# Patient Record
Sex: Male | Born: 1950 | Race: White | Hispanic: No | Smoking: Never smoker
Health system: Southern US, Community
[De-identification: ages and names within clinical notes are randomized; demographics above are authoritative.]

## PROBLEM LIST (undated history)

## (undated) DIAGNOSIS — E119 Type 2 diabetes mellitus without complications: Secondary | ICD-10-CM

## (undated) DIAGNOSIS — E039 Hypothyroidism, unspecified: Secondary | ICD-10-CM

## (undated) DIAGNOSIS — G510 Bell's palsy: Secondary | ICD-10-CM

## (undated) DIAGNOSIS — Q2381 Bicuspid aortic valve: Secondary | ICD-10-CM

## (undated) DIAGNOSIS — I712 Thoracic aortic aneurysm, without rupture, unspecified: Secondary | ICD-10-CM

## (undated) DIAGNOSIS — G2581 Restless legs syndrome: Secondary | ICD-10-CM

## (undated) DIAGNOSIS — F419 Anxiety disorder, unspecified: Secondary | ICD-10-CM

## (undated) DIAGNOSIS — I480 Paroxysmal atrial fibrillation: Secondary | ICD-10-CM

## (undated) DIAGNOSIS — H5440 Blindness, one eye, unspecified eye: Secondary | ICD-10-CM

## (undated) DIAGNOSIS — Z7901 Long term (current) use of anticoagulants: Secondary | ICD-10-CM

## (undated) DIAGNOSIS — E785 Hyperlipidemia, unspecified: Secondary | ICD-10-CM

## (undated) DIAGNOSIS — Z9289 Personal history of other medical treatment: Secondary | ICD-10-CM

## (undated) DIAGNOSIS — N189 Chronic kidney disease, unspecified: Secondary | ICD-10-CM

## (undated) DIAGNOSIS — I10 Essential (primary) hypertension: Secondary | ICD-10-CM

## (undated) DIAGNOSIS — Q231 Congenital insufficiency of aortic valve: Secondary | ICD-10-CM

## (undated) DIAGNOSIS — M1990 Unspecified osteoarthritis, unspecified site: Secondary | ICD-10-CM

## (undated) DIAGNOSIS — E669 Obesity, unspecified: Secondary | ICD-10-CM

## (undated) DIAGNOSIS — M222X9 Patellofemoral disorders, unspecified knee: Secondary | ICD-10-CM

## (undated) DIAGNOSIS — F329 Major depressive disorder, single episode, unspecified: Secondary | ICD-10-CM

## (undated) DIAGNOSIS — Z950 Presence of cardiac pacemaker: Secondary | ICD-10-CM

## (undated) DIAGNOSIS — F32A Depression, unspecified: Secondary | ICD-10-CM

## (undated) DIAGNOSIS — I2510 Atherosclerotic heart disease of native coronary artery without angina pectoris: Secondary | ICD-10-CM

## (undated) DIAGNOSIS — I442 Atrioventricular block, complete: Secondary | ICD-10-CM

## (undated) HISTORY — PX: AORTIC ROOT REPLACEMENT: SHX1178

## (undated) HISTORY — PX: TONSILLECTOMY: SUR1361

## (undated) HISTORY — DX: Patellofemoral disorders, unspecified knee: M22.2X9

## (undated) HISTORY — DX: Atherosclerotic heart disease of native coronary artery without angina pectoris: I25.10

## (undated) HISTORY — PX: CARDIAC VALVE REPLACEMENT: SHX585

## (undated) HISTORY — PX: CARDIAC CATHETERIZATION: SHX172

## (undated) HISTORY — PX: PACEMAKER INSERTION: SHX728

## (undated) HISTORY — DX: Personal history of other medical treatment: Z92.89

## (undated) HISTORY — DX: Thoracic aortic aneurysm, without rupture, unspecified: I71.20

## (undated) HISTORY — PX: AORTIC VALVE REPLACEMENT: SHX41

## (undated) HISTORY — DX: Thoracic aortic aneurysm, without rupture: I71.2

## (undated) HISTORY — PX: OTHER SURGICAL HISTORY: SHX169

---

## 1998-10-17 ENCOUNTER — Encounter: Payer: Self-pay | Admitting: Emergency Medicine

## 1998-10-17 ENCOUNTER — Emergency Department (HOSPITAL_COMMUNITY): Admission: EM | Admit: 1998-10-17 | Discharge: 1998-10-17 | Payer: Self-pay | Admitting: Emergency Medicine

## 1998-10-26 ENCOUNTER — Encounter: Admission: RE | Admit: 1998-10-26 | Discharge: 1999-01-24 | Payer: Self-pay | Admitting: *Deleted

## 1998-12-12 ENCOUNTER — Encounter: Payer: Self-pay | Admitting: Emergency Medicine

## 1998-12-12 ENCOUNTER — Emergency Department (HOSPITAL_COMMUNITY): Admission: EM | Admit: 1998-12-12 | Discharge: 1998-12-12 | Payer: Self-pay | Admitting: Emergency Medicine

## 1999-02-26 ENCOUNTER — Emergency Department (HOSPITAL_COMMUNITY): Admission: EM | Admit: 1999-02-26 | Discharge: 1999-02-26 | Payer: Self-pay | Admitting: Emergency Medicine

## 1999-02-26 ENCOUNTER — Encounter: Payer: Self-pay | Admitting: Emergency Medicine

## 1999-11-25 ENCOUNTER — Encounter: Payer: Self-pay | Admitting: Emergency Medicine

## 1999-11-25 ENCOUNTER — Inpatient Hospital Stay (HOSPITAL_COMMUNITY): Admission: EM | Admit: 1999-11-25 | Discharge: 1999-11-26 | Payer: Self-pay | Admitting: Internal Medicine

## 2000-07-24 ENCOUNTER — Ambulatory Visit (HOSPITAL_BASED_OUTPATIENT_CLINIC_OR_DEPARTMENT_OTHER): Admission: RE | Admit: 2000-07-24 | Discharge: 2000-07-24 | Payer: Self-pay | Admitting: Orthopaedic Surgery

## 2001-01-19 ENCOUNTER — Emergency Department (HOSPITAL_COMMUNITY): Admission: EM | Admit: 2001-01-19 | Discharge: 2001-01-19 | Payer: Self-pay | Admitting: Emergency Medicine

## 2001-01-19 ENCOUNTER — Encounter: Payer: Self-pay | Admitting: Emergency Medicine

## 2001-03-18 ENCOUNTER — Emergency Department (HOSPITAL_COMMUNITY): Admission: EM | Admit: 2001-03-18 | Discharge: 2001-03-18 | Payer: Self-pay | Admitting: Emergency Medicine

## 2001-03-18 ENCOUNTER — Encounter: Payer: Self-pay | Admitting: Emergency Medicine

## 2001-08-30 ENCOUNTER — Encounter: Payer: Self-pay | Admitting: Emergency Medicine

## 2001-08-30 ENCOUNTER — Emergency Department (HOSPITAL_COMMUNITY): Admission: EM | Admit: 2001-08-30 | Discharge: 2001-08-30 | Payer: Self-pay | Admitting: Emergency Medicine

## 2001-12-08 ENCOUNTER — Emergency Department (HOSPITAL_COMMUNITY): Admission: EM | Admit: 2001-12-08 | Discharge: 2001-12-08 | Payer: Self-pay | Admitting: Emergency Medicine

## 2001-12-08 ENCOUNTER — Encounter: Payer: Self-pay | Admitting: *Deleted

## 2002-01-07 ENCOUNTER — Encounter: Admission: RE | Admit: 2002-01-07 | Discharge: 2002-01-07 | Payer: Self-pay | Admitting: Family Medicine

## 2002-01-07 ENCOUNTER — Encounter: Payer: Self-pay | Admitting: Family Medicine

## 2002-04-18 ENCOUNTER — Emergency Department (HOSPITAL_COMMUNITY): Admission: EM | Admit: 2002-04-18 | Discharge: 2002-04-18 | Payer: Self-pay | Admitting: *Deleted

## 2002-05-13 ENCOUNTER — Encounter: Payer: Self-pay | Admitting: Emergency Medicine

## 2002-05-13 ENCOUNTER — Emergency Department (HOSPITAL_COMMUNITY): Admission: EM | Admit: 2002-05-13 | Discharge: 2002-05-13 | Payer: Self-pay | Admitting: Emergency Medicine

## 2003-04-16 ENCOUNTER — Emergency Department (HOSPITAL_COMMUNITY): Admission: EM | Admit: 2003-04-16 | Discharge: 2003-04-16 | Payer: Self-pay | Admitting: Emergency Medicine

## 2003-04-16 ENCOUNTER — Encounter: Payer: Self-pay | Admitting: Emergency Medicine

## 2004-12-07 ENCOUNTER — Ambulatory Visit: Payer: Self-pay | Admitting: Cardiology

## 2004-12-07 ENCOUNTER — Encounter: Payer: Self-pay | Admitting: Cardiology

## 2004-12-07 ENCOUNTER — Ambulatory Visit (HOSPITAL_COMMUNITY): Admission: RE | Admit: 2004-12-07 | Discharge: 2004-12-07 | Payer: Self-pay | Admitting: Internal Medicine

## 2004-12-13 ENCOUNTER — Ambulatory Visit: Payer: Self-pay | Admitting: Cardiology

## 2004-12-19 ENCOUNTER — Ambulatory Visit: Payer: Self-pay | Admitting: *Deleted

## 2004-12-23 ENCOUNTER — Ambulatory Visit: Payer: Self-pay

## 2005-01-04 ENCOUNTER — Ambulatory Visit: Payer: Self-pay | Admitting: Cardiology

## 2005-05-24 ENCOUNTER — Ambulatory Visit: Payer: Self-pay | Admitting: Cardiology

## 2005-05-24 ENCOUNTER — Encounter: Payer: Self-pay | Admitting: Cardiology

## 2005-05-24 ENCOUNTER — Inpatient Hospital Stay (HOSPITAL_COMMUNITY): Admission: AD | Admit: 2005-05-24 | Discharge: 2005-05-26 | Payer: Self-pay | Admitting: Cardiology

## 2005-05-24 ENCOUNTER — Ambulatory Visit: Payer: Self-pay | Admitting: Internal Medicine

## 2005-06-05 ENCOUNTER — Ambulatory Visit: Payer: Self-pay | Admitting: Cardiology

## 2005-06-05 ENCOUNTER — Encounter: Payer: Self-pay | Admitting: Nurse Practitioner

## 2006-09-12 ENCOUNTER — Encounter: Payer: Self-pay | Admitting: Internal Medicine

## 2006-09-12 ENCOUNTER — Ambulatory Visit: Payer: Self-pay

## 2006-09-12 ENCOUNTER — Ambulatory Visit: Payer: Self-pay | Admitting: Cardiology

## 2006-09-13 ENCOUNTER — Encounter (INDEPENDENT_AMBULATORY_CARE_PROVIDER_SITE_OTHER): Payer: Self-pay | Admitting: Cardiology

## 2006-09-13 LAB — CONVERTED CEMR LAB
BUN: 11 mg/dL (ref 6–23)
Creatinine, Ser: 0.91 mg/dL (ref 0.40–1.50)

## 2006-09-14 ENCOUNTER — Ambulatory Visit (HOSPITAL_COMMUNITY): Admission: RE | Admit: 2006-09-14 | Discharge: 2006-09-14 | Payer: Self-pay | Admitting: Cardiology

## 2006-10-03 ENCOUNTER — Ambulatory Visit: Payer: Self-pay | Admitting: Internal Medicine

## 2007-01-17 ENCOUNTER — Inpatient Hospital Stay (HOSPITAL_COMMUNITY): Admission: EM | Admit: 2007-01-17 | Discharge: 2007-01-18 | Payer: Self-pay | Admitting: Emergency Medicine

## 2007-01-17 ENCOUNTER — Ambulatory Visit: Payer: Self-pay | Admitting: *Deleted

## 2007-01-17 ENCOUNTER — Ambulatory Visit: Payer: Self-pay | Admitting: Cardiovascular Disease

## 2007-01-17 ENCOUNTER — Encounter: Payer: Self-pay | Admitting: Cardiovascular Disease

## 2007-01-17 ENCOUNTER — Ambulatory Visit: Payer: Self-pay | Admitting: Cardiology

## 2007-01-23 ENCOUNTER — Encounter: Payer: Self-pay | Admitting: Cardiovascular Disease

## 2007-01-23 ENCOUNTER — Ambulatory Visit: Payer: Self-pay | Admitting: Cardiology

## 2007-01-23 LAB — CONVERTED CEMR LAB
INR: 1.1 (ref 0.9–2.0)
Prothrombin Time: 12.9 s (ref 10.0–14.0)
aPTT: 25.5 s — ABNORMAL LOW (ref 26.5–36.5)

## 2007-01-31 ENCOUNTER — Ambulatory Visit: Payer: Self-pay | Admitting: Cardiology

## 2007-01-31 LAB — CONVERTED CEMR LAB
BUN: 11 mg/dL (ref 6–23)
Basophils Absolute: 0.1 10*3/uL (ref 0.0–0.1)
Calcium: 9.6 mg/dL (ref 8.4–10.5)
Eosinophils Absolute: 0.3 10*3/uL (ref 0.0–0.6)
GFR calc Af Amer: 100 mL/min
GFR calc non Af Amer: 82 mL/min
Glucose, Bld: 167 mg/dL — ABNORMAL HIGH (ref 70–99)
Lymphocytes Relative: 28.8 % (ref 12.0–46.0)
MCHC: 34.4 g/dL (ref 30.0–36.0)
MCV: 90.8 fL (ref 78.0–100.0)
Monocytes Relative: 9.9 % (ref 3.0–11.0)
Neutro Abs: 5.5 10*3/uL (ref 1.4–7.7)
Platelets: 216 10*3/uL (ref 150–400)
RBC: 4.45 M/uL (ref 4.22–5.81)
aPTT: 26.4 s — ABNORMAL LOW (ref 26.5–36.5)

## 2007-02-05 ENCOUNTER — Ambulatory Visit: Payer: Self-pay | Admitting: Cardiology

## 2007-02-05 ENCOUNTER — Inpatient Hospital Stay (HOSPITAL_BASED_OUTPATIENT_CLINIC_OR_DEPARTMENT_OTHER): Admission: RE | Admit: 2007-02-05 | Discharge: 2007-02-05 | Payer: Self-pay | Admitting: Cardiology

## 2007-03-06 ENCOUNTER — Ambulatory Visit: Payer: Self-pay | Admitting: Cardiology

## 2008-06-28 ENCOUNTER — Emergency Department (HOSPITAL_COMMUNITY): Admission: EM | Admit: 2008-06-28 | Discharge: 2008-06-28 | Payer: Self-pay | Admitting: Emergency Medicine

## 2008-07-01 ENCOUNTER — Ambulatory Visit (HOSPITAL_BASED_OUTPATIENT_CLINIC_OR_DEPARTMENT_OTHER): Admission: RE | Admit: 2008-07-01 | Discharge: 2008-07-01 | Payer: Self-pay | Admitting: Urology

## 2008-07-01 ENCOUNTER — Encounter: Payer: Self-pay | Admitting: Cardiovascular Disease

## 2008-12-17 ENCOUNTER — Ambulatory Visit: Payer: Self-pay | Admitting: Cardiology

## 2008-12-17 ENCOUNTER — Inpatient Hospital Stay (HOSPITAL_COMMUNITY): Admission: EM | Admit: 2008-12-17 | Discharge: 2008-12-20 | Payer: Self-pay | Admitting: Emergency Medicine

## 2008-12-18 ENCOUNTER — Encounter (INDEPENDENT_AMBULATORY_CARE_PROVIDER_SITE_OTHER): Payer: Self-pay | Admitting: Cardiology

## 2008-12-21 ENCOUNTER — Telehealth: Payer: Self-pay | Admitting: Cardiovascular Disease

## 2008-12-23 DIAGNOSIS — I359 Nonrheumatic aortic valve disorder, unspecified: Secondary | ICD-10-CM | POA: Insufficient documentation

## 2008-12-23 DIAGNOSIS — I712 Thoracic aortic aneurysm, without rupture, unspecified: Secondary | ICD-10-CM | POA: Insufficient documentation

## 2008-12-23 DIAGNOSIS — H5440 Blindness, one eye, unspecified eye: Secondary | ICD-10-CM | POA: Insufficient documentation

## 2008-12-23 DIAGNOSIS — N1832 Chronic kidney disease, stage 3b: Secondary | ICD-10-CM | POA: Insufficient documentation

## 2008-12-23 DIAGNOSIS — N189 Chronic kidney disease, unspecified: Secondary | ICD-10-CM | POA: Insufficient documentation

## 2008-12-23 DIAGNOSIS — Z794 Long term (current) use of insulin: Secondary | ICD-10-CM

## 2008-12-23 DIAGNOSIS — F4310 Post-traumatic stress disorder, unspecified: Secondary | ICD-10-CM | POA: Insufficient documentation

## 2008-12-23 DIAGNOSIS — R55 Syncope and collapse: Secondary | ICD-10-CM | POA: Insufficient documentation

## 2008-12-23 DIAGNOSIS — E1140 Type 2 diabetes mellitus with diabetic neuropathy, unspecified: Secondary | ICD-10-CM | POA: Insufficient documentation

## 2008-12-23 DIAGNOSIS — R519 Headache, unspecified: Secondary | ICD-10-CM | POA: Insufficient documentation

## 2008-12-23 DIAGNOSIS — R51 Headache: Secondary | ICD-10-CM | POA: Insufficient documentation

## 2008-12-23 DIAGNOSIS — E663 Overweight: Secondary | ICD-10-CM | POA: Insufficient documentation

## 2008-12-23 DIAGNOSIS — E039 Hypothyroidism, unspecified: Secondary | ICD-10-CM | POA: Insufficient documentation

## 2008-12-28 ENCOUNTER — Ambulatory Visit: Payer: Self-pay | Admitting: Cardiovascular Disease

## 2008-12-28 DIAGNOSIS — I2510 Atherosclerotic heart disease of native coronary artery without angina pectoris: Secondary | ICD-10-CM | POA: Insufficient documentation

## 2008-12-28 LAB — CONVERTED CEMR LAB
BUN: 18 mg/dL (ref 6–23)
GFR calc non Af Amer: 60.38 mL/min (ref >60)
Potassium: 4.5 meq/L (ref 3.5–5.1)

## 2008-12-29 ENCOUNTER — Ambulatory Visit: Payer: Self-pay | Admitting: Cardiology

## 2008-12-29 ENCOUNTER — Encounter: Payer: Self-pay | Admitting: Cardiovascular Disease

## 2009-01-28 ENCOUNTER — Ambulatory Visit: Payer: Self-pay | Admitting: Cardiovascular Disease

## 2009-05-15 ENCOUNTER — Ambulatory Visit: Payer: Self-pay | Admitting: Internal Medicine

## 2009-05-15 ENCOUNTER — Inpatient Hospital Stay (HOSPITAL_COMMUNITY): Admission: EM | Admit: 2009-05-15 | Discharge: 2009-05-18 | Payer: Self-pay | Admitting: Emergency Medicine

## 2009-05-16 ENCOUNTER — Encounter: Payer: Self-pay | Admitting: Internal Medicine

## 2009-05-17 ENCOUNTER — Encounter: Payer: Self-pay | Admitting: Internal Medicine

## 2009-05-17 ENCOUNTER — Encounter: Payer: Self-pay | Admitting: Surgery

## 2009-05-18 ENCOUNTER — Ambulatory Visit: Payer: Self-pay | Admitting: Surgery

## 2009-05-18 ENCOUNTER — Encounter: Payer: Self-pay | Admitting: Internal Medicine

## 2009-06-01 ENCOUNTER — Ambulatory Visit: Payer: Self-pay | Admitting: Surgery

## 2009-06-03 ENCOUNTER — Ambulatory Visit: Admission: RE | Admit: 2009-06-03 | Discharge: 2009-06-03 | Payer: Self-pay | Admitting: Surgery

## 2009-06-07 ENCOUNTER — Inpatient Hospital Stay (HOSPITAL_COMMUNITY): Admission: RE | Admit: 2009-06-07 | Discharge: 2009-06-15 | Payer: Self-pay | Admitting: Surgery

## 2009-06-07 ENCOUNTER — Ambulatory Visit: Payer: Self-pay | Admitting: Cardiology

## 2009-06-07 ENCOUNTER — Encounter: Payer: Self-pay | Admitting: Surgery

## 2009-06-09 ENCOUNTER — Encounter: Payer: Self-pay | Admitting: Surgery

## 2009-06-10 ENCOUNTER — Encounter: Payer: Self-pay | Admitting: Internal Medicine

## 2009-06-11 ENCOUNTER — Encounter (INDEPENDENT_AMBULATORY_CARE_PROVIDER_SITE_OTHER): Payer: Self-pay | Admitting: *Deleted

## 2009-06-17 ENCOUNTER — Ambulatory Visit: Payer: Self-pay | Admitting: Cardiology

## 2009-06-17 LAB — CONVERTED CEMR LAB: POC INR: 3.3

## 2009-06-21 ENCOUNTER — Encounter: Payer: Self-pay | Admitting: Cardiovascular Disease

## 2009-06-22 ENCOUNTER — Ambulatory Visit: Payer: Self-pay | Admitting: Cardiovascular Disease

## 2009-06-24 ENCOUNTER — Ambulatory Visit: Payer: Self-pay | Admitting: Cardiovascular Disease

## 2009-06-29 ENCOUNTER — Encounter: Admission: RE | Admit: 2009-06-29 | Discharge: 2009-06-29 | Payer: Self-pay | Admitting: Surgery

## 2009-07-02 ENCOUNTER — Ambulatory Visit: Payer: Self-pay | Admitting: Internal Medicine

## 2009-07-02 LAB — CONVERTED CEMR LAB: POC INR: 3

## 2009-07-05 ENCOUNTER — Ambulatory Visit: Payer: Self-pay | Admitting: Surgery

## 2009-07-06 ENCOUNTER — Encounter: Payer: Self-pay | Admitting: Cardiovascular Disease

## 2009-12-02 ENCOUNTER — Emergency Department (HOSPITAL_COMMUNITY): Admission: EM | Admit: 2009-12-02 | Discharge: 2009-12-03 | Payer: Self-pay | Admitting: Emergency Medicine

## 2009-12-03 ENCOUNTER — Ambulatory Visit: Payer: Self-pay | Admitting: Cardiology

## 2009-12-03 ENCOUNTER — Inpatient Hospital Stay (HOSPITAL_COMMUNITY): Admission: EM | Admit: 2009-12-03 | Discharge: 2009-12-05 | Payer: Self-pay | Admitting: Emergency Medicine

## 2009-12-03 ENCOUNTER — Encounter (INDEPENDENT_AMBULATORY_CARE_PROVIDER_SITE_OTHER): Payer: Self-pay | Admitting: Internal Medicine

## 2010-01-20 ENCOUNTER — Emergency Department (HOSPITAL_COMMUNITY): Admission: EM | Admit: 2010-01-20 | Discharge: 2010-01-20 | Payer: Self-pay | Admitting: Emergency Medicine

## 2010-01-30 ENCOUNTER — Emergency Department (HOSPITAL_COMMUNITY): Admission: EM | Admit: 2010-01-30 | Discharge: 2010-01-30 | Payer: Self-pay | Admitting: Emergency Medicine

## 2010-03-02 ENCOUNTER — Inpatient Hospital Stay (HOSPITAL_COMMUNITY): Admission: EM | Admit: 2010-03-02 | Discharge: 2010-03-03 | Payer: Self-pay | Admitting: Emergency Medicine

## 2010-03-28 ENCOUNTER — Inpatient Hospital Stay (HOSPITAL_COMMUNITY): Admission: EM | Admit: 2010-03-28 | Discharge: 2010-03-30 | Payer: Self-pay | Admitting: Emergency Medicine

## 2010-04-04 ENCOUNTER — Encounter (INDEPENDENT_AMBULATORY_CARE_PROVIDER_SITE_OTHER): Payer: Self-pay | Admitting: *Deleted

## 2010-05-20 ENCOUNTER — Emergency Department (HOSPITAL_COMMUNITY): Admission: EM | Admit: 2010-05-20 | Discharge: 2010-05-20 | Payer: Self-pay | Admitting: Emergency Medicine

## 2010-05-23 ENCOUNTER — Emergency Department (HOSPITAL_COMMUNITY): Admission: EM | Admit: 2010-05-23 | Discharge: 2010-05-23 | Payer: Self-pay | Admitting: Emergency Medicine

## 2010-06-03 ENCOUNTER — Encounter (INDEPENDENT_AMBULATORY_CARE_PROVIDER_SITE_OTHER): Payer: Self-pay | Admitting: *Deleted

## 2010-06-16 ENCOUNTER — Encounter: Payer: Self-pay | Admitting: Internal Medicine

## 2010-07-02 ENCOUNTER — Emergency Department (HOSPITAL_COMMUNITY): Admission: EM | Admit: 2010-07-02 | Discharge: 2010-07-02 | Payer: Self-pay | Admitting: Emergency Medicine

## 2010-08-23 IMAGING — CR DG CHEST 1V PORT
1 series · 1 of 1 positions shown · non-contrast
Comparison: 06/09/2009 study.

CLINICAL DATA: History given of mitral valvular replacement.
History given of dyspnea.

PORTABLE CHEST - 1 VIEW

[view not recorded]
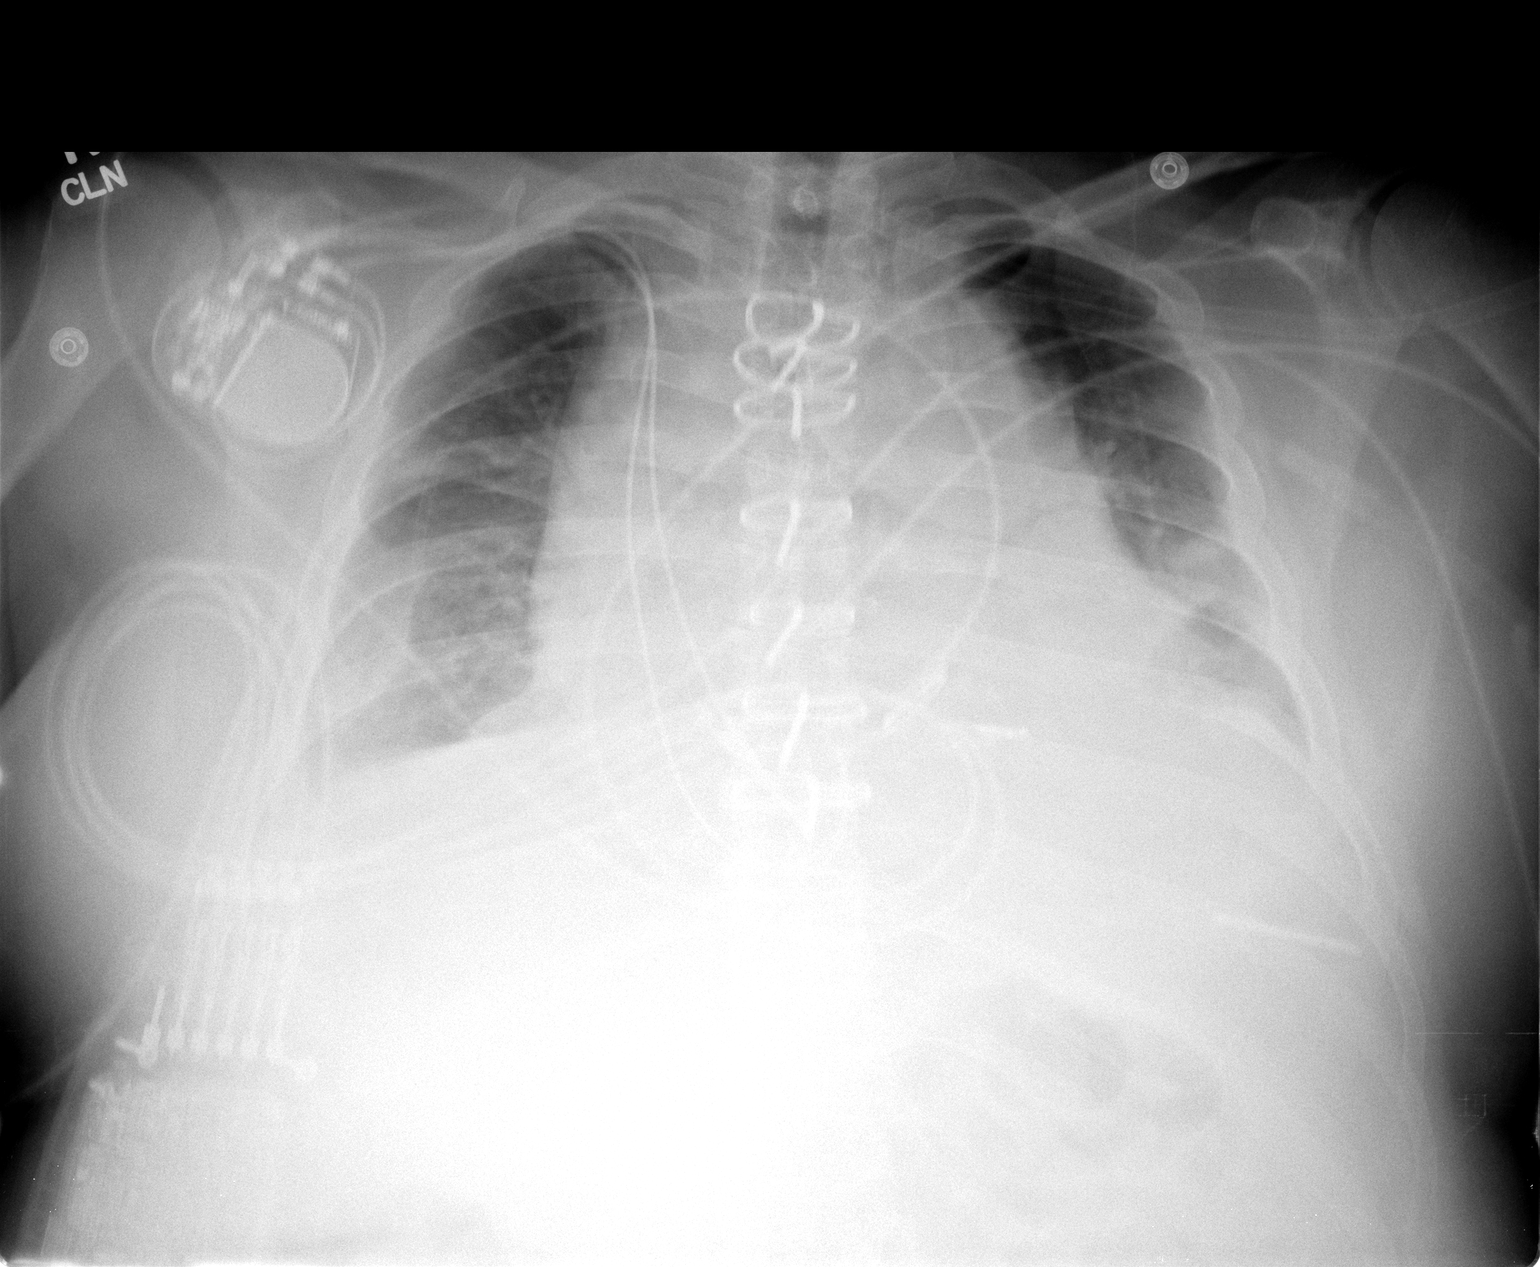

[1 of 1 positions shown; findings below may reference images not displayed]

FINDINGS: Detail is degraded by motion.
Median sternotomy has been performed.  There is stable moderate
enlargement of the cardiac silhouette.  There is hypoinflation of
the lungs with areas of subsegmental atelectasis in the right mid
and lower lung and in the lower left lung.  Left basilar
retrocardiac opacity is seen consistent with left basilar
atelectasis, infiltrate, and pleural effusion. Slight blunting
right costophrenic angle may reflect a small amount of right
pleural effusion.  Dual lead transvenous pacemaker is in place with
controller device on the right.
IMPRESSION: There is stable enlargement of the cardiac silhouette.  There is
mild vascular congestion pattern with hypoinflation of the lungs.
Areas of atelectasis are seen in the mid and lower lung zones.  The
atelectasis has increased slightly in the left lower mid lung zone.
Left basilar opacity consistent with atelectasis, infiltrate, and
left pleural effusion is persistent.

## 2010-08-23 IMAGING — CR DG CHEST 1V PORT
1 series · 1 of 1 positions shown · non-contrast
Comparison: Chest radiograph 06/08/2009

CLINICAL DATA: Ascending aortic repair

PORTABLE CHEST - 1 VIEW

[AP]
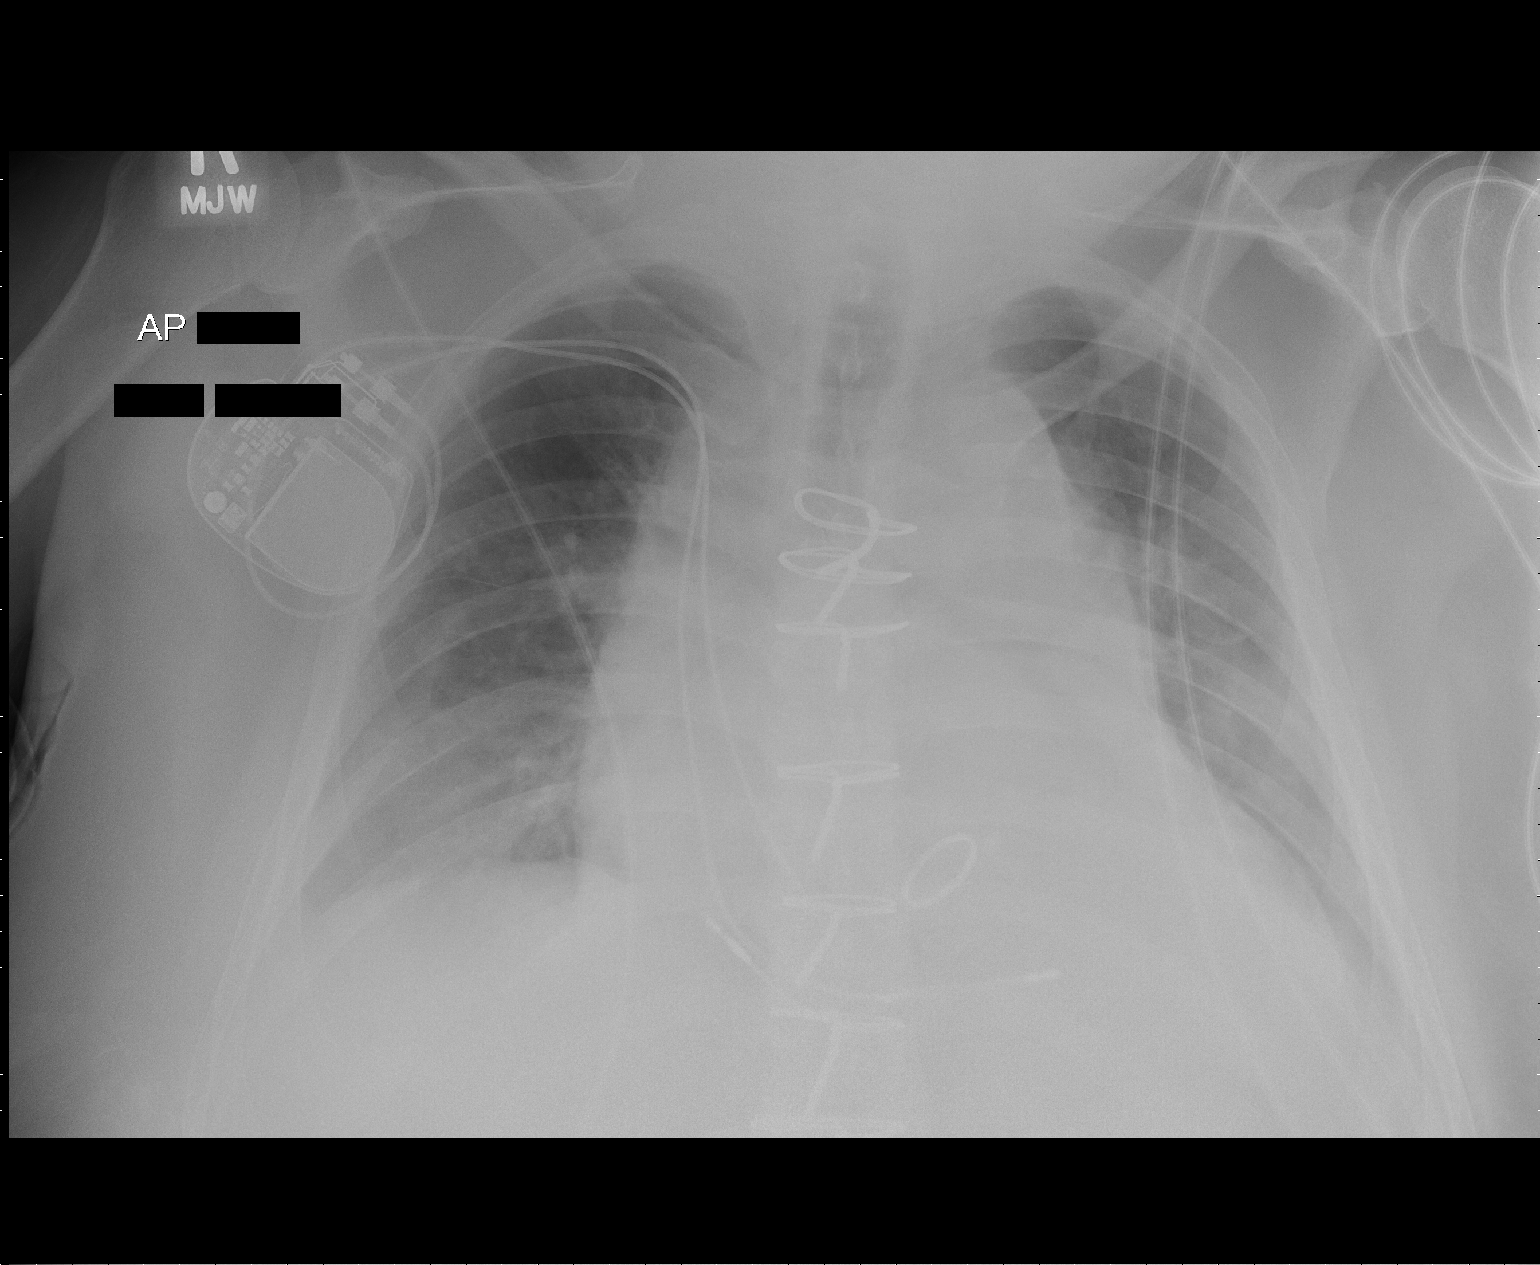

[1 of 1 positions shown; findings below may reference images not displayed]

FINDINGS: Sternotomy wires overlie enlarged cardiac silhouette.
Interval retraction of Swan-Ganz catheter.  There is bibasilar
atelectasis and small effusions which are unchanged from prior.  No
pneumothorax.
IMPRESSION: 1.  No significant interval change other than removal of Swan-Ganz
catheter.
2.  Cardiomegaly, basilar atelectasis, and small effusions.

## 2010-08-24 IMAGING — CR DG CHEST 1V PORT
1 series · 1 of 1 positions shown · non-contrast
Comparison: Earlier today

CLINICAL DATA: Postop  ascending aortic aneurysm.

PORTABLE CHEST - 1 VIEW

[view not recorded]
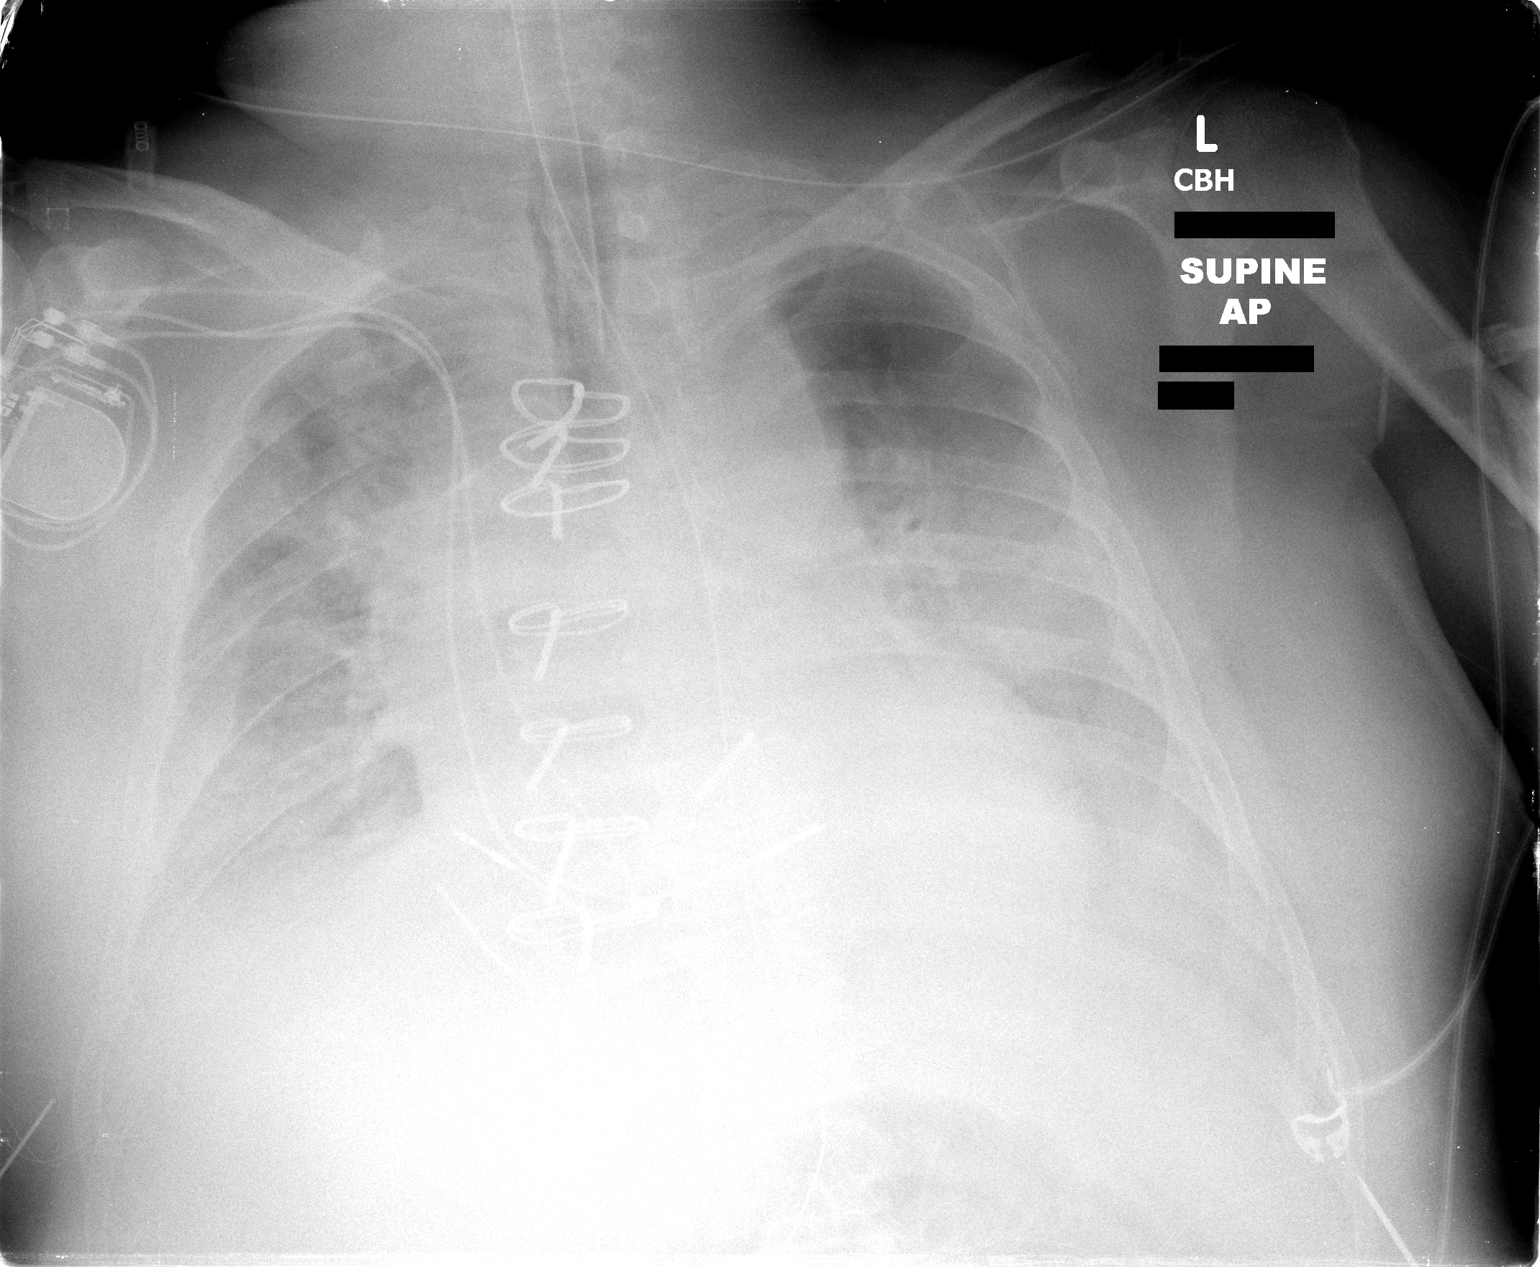

[1 of 1 positions shown; findings below may reference images not displayed]

FINDINGS: 5458 hours.  Endotracheal tube unchanged.  Nasogastric
extends beyond the  inferior aspect of the film.  Pacer leads
unchanged. Prior median sternotomy. Left-sided subclavian line
unchanged.  Aortic valve repair.  Moderate cardiomegaly accentuated
by low lung volumes. No pneumothorax.  Worsened aeration.
Increased interstitial edema and lower lobe predominant airspace
disease.  Probable layering small bilateral pleural effusions.
IMPRESSION: 1.  Decreased lung volumes with worsened aeration and  increased
edema/airspace disease.
2.  Probable bilateral pleural effusions.
3. No pneumothorax.

## 2010-09-06 ENCOUNTER — Emergency Department (HOSPITAL_COMMUNITY)
Admission: EM | Admit: 2010-09-06 | Discharge: 2010-09-06 | Payer: Self-pay | Source: Home / Self Care | Admitting: Emergency Medicine

## 2010-09-27 ENCOUNTER — Ambulatory Visit: Admission: RE | Admit: 2010-09-27 | Discharge: 2010-09-27 | Payer: Self-pay | Source: Home / Self Care

## 2010-09-27 ENCOUNTER — Encounter: Payer: Self-pay | Admitting: Cardiovascular Disease

## 2010-09-27 ENCOUNTER — Inpatient Hospital Stay (HOSPITAL_COMMUNITY)
Admission: EM | Admit: 2010-09-27 | Discharge: 2010-09-29 | Payer: Self-pay | Source: Home / Self Care | Attending: Cardiovascular Disease | Admitting: Cardiovascular Disease

## 2010-09-27 DIAGNOSIS — R079 Chest pain, unspecified: Secondary | ICD-10-CM | POA: Insufficient documentation

## 2010-09-28 LAB — HEPARIN LEVEL (UNFRACTIONATED)
Heparin Unfractionated: 0.14 IU/mL — ABNORMAL LOW (ref 0.30–0.70)
Heparin Unfractionated: 0.23 IU/mL — ABNORMAL LOW (ref 0.30–0.70)

## 2010-09-28 LAB — GLUCOSE, CAPILLARY
Glucose-Capillary: 268 mg/dL — ABNORMAL HIGH (ref 70–99)
Glucose-Capillary: 152 mg/dL — ABNORMAL HIGH (ref 70–99)
Glucose-Capillary: 231 mg/dL — ABNORMAL HIGH (ref 70–99)
Glucose-Capillary: 180 mg/dL — ABNORMAL HIGH (ref 70–99)
Glucose-Capillary: 166 mg/dL — ABNORMAL HIGH (ref 70–99)

## 2010-09-28 LAB — POCT I-STAT, CHEM 8
Calcium, Ion: 1.23 mmol/L (ref 1.12–1.32)
Chloride: 105 mEq/L (ref 96–112)
Glucose, Bld: 184 mg/dL — ABNORMAL HIGH (ref 70–99)
HCT: 45 % (ref 39.0–52.0)
TCO2: 24 mmol/L (ref 0–100)

## 2010-09-28 LAB — CK TOTAL AND CKMB (NOT AT ARMC)
CK, MB: 2.5 ng/mL (ref 0.3–4.0)
Total CK: 33 U/L (ref 7–232)

## 2010-09-28 LAB — COMPREHENSIVE METABOLIC PANEL
ALT: 90 U/L — ABNORMAL HIGH (ref 0–53)
AST: 56 U/L — ABNORMAL HIGH (ref 0–37)
Albumin: 3.8 g/dL (ref 3.5–5.2)
Alkaline Phosphatase: 63 U/L (ref 39–117)
GFR calc Af Amer: >60 mL/min (ref >60)
Potassium: 4.1 mEq/L (ref 3.5–5.1)
Sodium: 137 mEq/L (ref 135–145)
Total Protein: 6.8 g/dL (ref 6.0–8.3)

## 2010-09-28 LAB — CBC
HCT: 39.9 % (ref 39.0–52.0)
Hemoglobin: 13.3 g/dL (ref 13.0–17.0)
Hemoglobin: 12.9 g/dL — ABNORMAL LOW (ref 13.0–17.0)
MCH: 28.5 pg (ref 26.0–34.0)
MCHC: 32.7 g/dL (ref 30.0–36.0)
MCV: 88.1 fL (ref 78.0–100.0)
Platelets: 223 10*3/uL (ref 150–400)
RDW: 13.5 % (ref 11.5–15.5)
WBC: 8.8 10*3/uL (ref 4.0–10.5)
WBC: 9.6 10*3/uL (ref 4.0–10.5)

## 2010-09-28 LAB — PROTIME-INR: INR: 1.52 — ABNORMAL HIGH (ref 0.00–1.49)

## 2010-09-28 LAB — DIFFERENTIAL
Basophils Relative: 1 % (ref 0–1)
Eosinophils Absolute: 0.3 10*3/uL (ref 0.0–0.7)
Eosinophils Relative: 3 % (ref 0–5)
Lymphs Abs: 1.8 10*3/uL (ref 0.7–4.0)
Monocytes Relative: 11 % (ref 3–12)

## 2010-09-28 LAB — MRSA PCR SCREENING: MRSA by PCR: NEGATIVE

## 2010-09-28 LAB — CARDIAC PANEL(CRET KIN+CKTOT+MB+TROPI)
CK, MB: 3.1 ng/mL (ref 0.3–4.0)
CK, MB: 3.2 ng/mL (ref 0.3–4.0)
Relative Index: INVALID (ref 0.0–2.5)
Total CK: 50 U/L (ref 7–232)
Total CK: 39 U/L (ref 7–232)
Troponin I: 0.05 ng/mL (ref 0.00–0.06)

## 2010-09-28 LAB — POCT CARDIAC MARKERS

## 2010-09-28 LAB — D-DIMER, QUANTITATIVE: D-Dimer, Quant: 0.35 ug/mL-FEU (ref 0.00–0.48)

## 2010-09-28 LAB — TSH: TSH: 2.899 u[IU]/mL (ref 0.350–4.500)

## 2010-09-28 LAB — HEMOGLOBIN A1C: Hgb A1c MFr Bld: 8.1 % — ABNORMAL HIGH (ref <5.7)

## 2010-09-29 LAB — BASIC METABOLIC PANEL
BUN: 14 mg/dL (ref 6–23)
Chloride: 104 mEq/L (ref 96–112)
GFR calc non Af Amer: 56 mL/min — ABNORMAL LOW (ref >60)
Glucose, Bld: 123 mg/dL — ABNORMAL HIGH (ref 70–99)
Potassium: 3.9 mEq/L (ref 3.5–5.1)
Sodium: 136 mEq/L (ref 135–145)

## 2010-09-29 LAB — GLUCOSE, CAPILLARY

## 2010-09-29 LAB — CBC
HCT: 39 % (ref 39.0–52.0)
Hemoglobin: 13.1 g/dL (ref 13.0–17.0)
MCHC: 33.6 g/dL (ref 30.0–36.0)
MCV: 88.4 fL (ref 78.0–100.0)
RDW: 14 % (ref 11.5–15.5)

## 2010-09-29 LAB — APTT
aPTT: >200 seconds (ref 24–37)
aPTT: 83 seconds — ABNORMAL HIGH (ref 24–37)

## 2010-09-29 LAB — PROTIME-INR
INR: 2.25 — ABNORMAL HIGH (ref 0.00–1.49)
Prothrombin Time: 25 seconds — ABNORMAL HIGH (ref 11.6–15.2)

## 2010-09-29 NOTE — H&P (Addendum)
NAME:  Andre Malone, KAU NO.:  [id number]  MEDICAL RECORD NO.:  [id number]          PATIENT TYPE:  INP  LOCATION:  2502                         FACILITY:  [facility]  PHYSICIAN:  Rollene Rotunda, MD, FACCDATE OF BIRTH:  15-Dec-1950  DATE OF ADMISSION:  09/27/2010 DATE OF DISCHARGE:                             HISTORY & PHYSICAL   PRIMARY CARE PROVIDER:  Belva Agee, nurse practitioner, [facility].  PRIMARY CARDIOLOGIST:  Verne Carrow, MD  REASON FOR PRESENTATION:  Evaluate the patient with chest pain.  HISTORY OF PRESENT ILLNESS:  The patient is 60 years old.  He has an extensive cardiac history as described below.  He has been describing chest discomfort for about a week.  He has two different types.  One is a fleeting, shooting discomfort across his chest.  The other is a left- sided discomfort under his left breast that feels "hollow."  It radiates out to his arm.  This discomfort actually lasts 4-5 hours at a time.  It is associated with some mild shortness of breath.  Both symptoms occur at rest.  He has not taken anything to try to relieve them.  It is not aggravated by movements or breathing.  He has never had this kind of discomfort before.  He otherwise had been well prior to this week.  He has not been having any fevers, chills, or cough.  He is not particularly active though "he chases the dog."  With this activity, he has not been getting any symptoms.  He presented to our office to schedule an appointment and was referred to the emergency room.  Here, he is still having 6/10 chest discomfort despite getting fentanyl. First cardiac markers were negative.  EKG is atrial rhythm with ventricular pacing.  He denies any new PND or orthopnea.  He has not had any new palpitations, presyncope, or syncope.  PAST MEDICAL HISTORY: 1. Aortic stenosis. 2. Thoracic aneurysm. 3. Chronic renal insufficiency. 4. Post traumatic stress  disorder. 5. Right eye blindness. 6. Hypothyroidism. 7. Diabetes mellitus. 8. Hyperlipidemia. 9. Coronary artery disease (previous stenting of his right coronary     artery in 2006.  His most recent catheterization was nonobstructive     disease prior to valve replacement. 10.Complete heart block (status post St. Jude Accent pacemaker). 11.Gastroesophageal reflux disease. 12.Restless legs syndrome. 13.Nephrolithiasis. 14.Mildly reduced ejection fraction (EF 45%).  PAST SURGICAL HISTORY: 1. Aortic valve replacement (#21 mechanical St. Jude's). 2. Zachary George (June 07, 2009). 3. Pericardial effusion status post subxiphoid window. 4. Right elbow surgery. 5. Tonsillectomy.  ALLERGIES:  ANTIHISTAMINE, BENADRYL.  MEDICATIONS:  Cymbalta 30 mg daily, iron 324 mg b.i.d., Janumet 50/1000 b.i.d., Lantus 50 units nightly, Levothroid 75 mcg daily, Lovastatin 40 mg daily, Lyrica, Percocet, Coumadin.  SOCIAL HISTORY:  The patient was wounded from [place].  He is married. He has not smoked cigarettes.  Does not drink alcohol.  FAMILY HISTORY:  Noncontributory for early coronary disease.  REVIEW OF SYSTEMS:  Positive for recent diarrhea, nephrolithiasis having recently passed multiple kidney stones.  PHYSICAL EXAMINATION:  GENERAL:  The patient is in no distress. VITAL SIGNS:  Blood  pressure 120/65, heart rate 74 and regular, respiratory rate 16, afebrile. HEENT:  Eyes unremarkable, pupils equal, round, and reactive to light, fundi not visualized.  Oral mucosa unremarkable. NECK:  No jugular distention at 45 degrees, carotid upstroke brisk and symmetric, no carotid bruits, thyromegaly. LYMPHATICS:  No cervical, axillary, or inguinal. LUNGS:  Clear to auscultation bilaterally. BACK:  No costovertebral angle tenderness. CHEST WALL:  Well-healed right upper pacemaker pocket, well-healed sternotomy. HEART:  PMI not displaced or sustained, S1 within normal limits, mechanical S2, a 3/6 apical  systolic murmur radiating out to the aortic outflow tract and early peaking, no diastolic murmurs. ABDOMEN:  Flat, positive bowel sounds, normal in frequency and pitch, no bruits, no rebound, no guarding, no midline pulsatile mass, no hepatomegaly, no splenomegaly. SKIN:  No rashes, no nodules. EXTREMITIES:  A 2+ pulses throughout, no edema, no cyanosis, no clubbing. NEUROLOGIC:  Oriented to person, place, and time, cranial nerves II through XII grossly intact, motor grossly intact.  EKG, sinus rhythm with ventricular pacing. Chest x-ray, atelectasis.  LABORATORY DATA:  INR 1.59, point-of-care negative x1, WBC 8.8, hemoglobin 12.9, platelets 223, sodium 137, potassium 4.5, creatinine 1.2.  ASSESSMENT AND PLAN: 1. Chest pain.  The patient's chest pain is atypical.  There is no     objective evidence of ischemia.  At this point, he will be admitted     and cardiac enzymes cycled.  If his enzymes are negative, I would     not suggest further cardiovascular testing with my suggest of GI     referral if he has persistent pain. 2. Aortic valve replacement.  He is subtherapeutic on his Coumadin.     This will be continued, and he will be bridged with heparin for     now.  I will repeat an echocardiogram as he did have mildly reduced     ejection fraction     previously and a more prominent murmur than I might expect with his     St. Jude's valve. 3. Diabetes.  He will continue on his Lantus and sliding scale     insulin. 4. Hypothyroidism.  We will check a TSH.     Rollene Rotunda, MD, [facility]     JH/MEDQ  D:  09/27/2010  T:  09/28/2010  Job:  [id number]  cc:   Belva Agee, nurse practitioner  Electronically Signed by Rollene Rotunda MD [facility] on 09/29/2010 12:33:14 PM

## 2010-09-30 NOTE — Procedures (Signed)
NAME:  Andre Malone, Andre Malone NO.:  [id number]  MEDICAL RECORD NO.:  [id number]           PATIENT TYPE:  LOCATION:                                 FACILITY:  PHYSICIAN:  Verne Carrow, MDDATE OF BIRTH:  17-Sep-1950  DATE OF PROCEDURE:  09/28/2010 DATE OF DISCHARGE:                           CARDIAC CATHETERIZATION   PROCEDURE PERFORMED: 1. Left heart catheterization. 2. Selective coronary angiography.  OPERATOR:  Verne Carrow, MD  ARTERIAL ACCESS SITE:  Right radial artery.  INDICATIONS:  This is a 60 year old patient with a history of coronary artery disease as well as severe aortic valve stenosis and thoracic aortic aneurysm who underwent mechanical aortic valve replacement in October 2010 with aortic root replacement at that time.  The patient has done well over the last year, but has over the last several weeks been having episodes of continuous left-sided chest discomfort while at rest. The pain has many atypical features.  The patient was admitted to the hospital yesterday and had negative cardiac enzymes and no ischemic EKG changes.  I spoke to him this morning about his previous catheterization in September 2010 which showed moderate disease in the right coronary artery and mid left anterior descending artery.  Based on his symptoms, we decided to proceed with a diagnostic left heart catheterization to exclude progression of coronary artery disease as the cause of his chest pain.  Of note, the patient is on chronic Coumadin therapy for his mechanical aortic valve replacement; however, on admission his INR is 1.59.  The patient was placed on a heparin drip overnight.  His INR is 1.5 this a.m.  PROCEDURE IN DETAIL:  The patient was brought to the main cardiac catheterization laboratory after signing informed consent for the procedure.  An Freida Busman test was performed on the right wrist and was positive.  The right wrist was prepped and draped in  sterile fashion. Lidocaine 1% was used for local anesthesia.  A 5-French sheath was inserted into the right radial artery without difficulty.  Standard diagnostic catheters were used to perform selective coronary angiography.  No pigtail catheter was used to cross the aortic valve as this patient has a mechanical aortic valve replacement.  The patient tolerated the procedure well.  Of note, we gave 3 mg of verapamil through the sheath after insertion as well as 5000 units of intravenous heparin.  The patient had the sheath removed here in the cath lab with a Terumo hemostasis band applied over the arteriotomy site.  The patient was taken to the recovery area in stable condition.  HEMODYNAMIC FINDINGS:  Central aortic pressure 131/73.  ANGIOGRAPHIC FINDINGS: 1. The left main coronary artery had a distal 30% plaque. 2. The left anterior descending was a moderate-sized vessel that     coursed nearly to the apex; however, it did not reach the apex.     The left anterior descending artery changed significantly in     caliber beyond the takeoff of the diagonal branch.  The midvessel     had a 40% to 50% stenosis just beyond the takeoff of the diagonal     branch.  The  diagonal branch appeared to have mild plaque in the     proximal portion.  These lesions did not appear changed since the     last catheterization and did not appear to be flow-limiting. 3. The circumflex artery gave off a small-caliber obtuse marginal     branch that had no disease.  The AV groove circumflex had mild     plaque disease.  There was a moderate-to-large sized posterolateral     branch that had no disease. 4. The ramus intermediate was a moderate-sized vessel with mild plaque     disease. 5. The right coronary artery was a large dominant vessel with 40%     diffuse stenosis throughout the midportion of the vessel that     appeared unchanged since the last catheterization.  IMPRESSION: 1. Mild-to-moderate  coronary artery disease with no progression since     last catheterization. 2. Likely noncardiac chest pain.  RECOMMENDATIONS:  We will add a proton pump inhibitor and we will continue the current medications as written.  The patient's metformin will be held for 48 hours.     Verne Carrow, MD     CM/MEDQ  D:  09/28/2010  T:  09/29/2010  Job:  [id number]  Electronically Signed by Verne Carrow MD on 09/30/2010 05:59:06 PM

## 2010-10-04 NOTE — Discharge Summary (Signed)
NAME:  Andre, Malone NO.:  [id number]  MEDICAL RECORD NO.:  [id number]          PATIENT TYPE:  INP  LOCATION:  6523                         FACILITY:  [facility]  PHYSICIAN:  Veverly Fells. Excell Seltzer, MD  DATE OF BIRTH:  Apr 15, 1951  DATE OF ADMISSION:  09/27/2010 DATE OF DISCHARGE:  09/29/2010                              DISCHARGE SUMMARY   PRIMARY CARDIOLOGIST:  Verne Carrow, MD  PRIMARY CARE PROVIDER:  Belva Agee, Nurse Practitioner, at [facility]  DISCHARGE DIAGNOSES: 1. Noncardiac chest pain, suspect chest wall/costochondritis. 2. Nonobstructive coronary artery disease status post cardiac     catheterization on September 28, 2010, demonstrating mild to moderate     coronary artery disease with no progression since last     catheterization.     a.     Left main coronary artery with distal 30% plaque.  Left      anterior descending with 40-50% stenosis just beyond the takeoff      of the diagonal branch.  The circumflex artery without disease.      The right coronary artery with 40% diffuse stenosis throughout the      midportion.     b.     Status post aortic valve replacement with a #21 mechanical      St. Jude's valve.  SECONDARY DIAGNOSES: 1. Thoracic aneurysm. 2. Chronic renal insufficiency. 3. Posttraumatic stress disorder. 4. Right eye blindness. 5. Hypothyroidism. 6. Diabetes mellitus. 7. Hyperlipidemia. 8. Complete heart block status post St. Jude ACCENT pacemaker. 9. Gastroesophageal reflux disease. 10.Rest of restless legs syndrome.  ALLERGIES: 1. BENADRYL making restless legs worse. 2. ACE INHIBITORS causing a cough. 3. VALSARTAN causing kidney failure. 4. METOPROLOL causing kidney failure.  PROCEDURE/DIAGNOSTICS:  Performed during hospitalization: 1. Left heart catheterization with selective coronary angiography. 2. Chest x-ray showing cardiomegaly without edema.  There is lingular     subsegmental  atelectasis versus scarring.  Low lung volumes.  REASON FOR HOSPITALIZATION:  This is a 60 year old gentleman with the above-stated problem list who states he has been having chest discomfort for approximately a week.  He has fleeting/shooting discomfort across his chest as well as discomfort that radiates to his arm that lasts 4-5 hours.  It is associated with mild shortness of breath.  The patient's EKG showed sinus rhythm with a ventricular pacing.  Initial point-of-care markers were negative.  The patient was admitted for further evaluation.  HOSPITAL COURSE:  The patient ruled out for myocardial infarction with cardiac enzymes being negative x3.  He continued to have chest pain on and off.  With the patient's coronary history but presenting with atypical chest pain, he was brought for left heart catheterization to evaluate coronary anatomy.  As above, Dr. Clifton James brought the patient to the cardiac cath lab, informed consent was obtained.  The patient did demonstrate mild to moderate coronary artery disease that was unchanged from prior cardiac catheterization.  The patient did tolerate the procedure well.  It was felt the patient's chest pain was likely noncardiac.  The patient was continued on his current medication.  A 2-D echocardiogram was completed after catheterization,  and these results are currently not available for review.  There was a note that the patient did state his chest pain was worse when the echo tech was rubbing instrument across his chest.  On day of discharge, Dr. Excell Seltzer evaluated the patient and noted him stable for home.  His radial site was without signs of hematoma and was cleared.  He continued to have constant chest tenderness with palpation, and with the patient's cath results, it was determined that the patient's chest pain was noncardiac and felt to be more chest wall as well as costochondritis pain.  Therefore, the patient will be discharged on  home medications and started on a course of nonsteroidals for 10 days.  The patient will follow up with his primary care physician for further evaluation.  DISCHARGE LABORATORY DATA:  CBC 7.4, hemoglobin 13.1, hematocrit 39, platelets 179.  INR 2.25.  Sodium 136, potassium 3.9, chloride 104, bicarb 26, BUN 14, creatinine 1.31.  FOLLOWUP PLANS AND INSTRUCTIONS: 1. The patient will follow up with Dr. Clifton James in 3 months. 2. The patient will call Dr. Langston Masker' office for followup plus INR draw     early next week. 3. The patient will follow up with nurse practitioner, Belva Agee,     within the next 2 weeks, he will call to schedule this appointment. 4. The patient is to increase activity slowly, he may shower but no     bathing.  No lifting for 1 week greater than 5 pounds.  No sexual     activity for 1 week.  He is to keep his cath site clean and dry and     call our office for any problems. 5. He is to continue on low-sodium heart-healthy and diabetic diet. 6. He is to avoid any straining.  DISCHARGE MEDICATIONS: 1. Naproxen 500 mg one tablet twice daily for 10 days. 2. Metformin 1000 mg one tablet twice daily, the patient will hold     this medication through September 30, 2010. 3. Levothroid 75 mcg one tablet daily. 4. Lantus subcu 50 units daily at bedtime. 5. Temazepam 30 mg one tablet daily at bedtime as needed. 6. Tekturna/hydrochlorothiazide 150/25 mg one tablet daily. 7. Fenofibric acid 134 mg one tablet daily. 8. Coumadin 5 mg, one-half to one tablet daily, take 5 mg daily except     Wednesdays taking 2.5 mg. 9. Sertraline 100 mg one tablet daily. 10.Percocet 5/325 one tablet every 4 hours as needed for restless     legs. 11.Lyrica 100 mg one tablet three times a day as needed for pain.  DURATION OF DISCHARGE:  Greater than 30 minutes with physician and physician extender time.     [name], PA-C   ______________________________ Veverly Fells. Excell Seltzer,  MD    NB/MEDQ  D:  09/29/2010  T:  09/29/2010  Job:  [id number]  cc:   Verne Carrow, MD Belva Agee, nurse practitioner  Electronically Signed by Alen Blew P.A. on 10/03/2010 01:40:53 PM Electronically Signed by Tonny Bollman MD on 10/04/2010 04:56:39 AM

## 2010-10-06 NOTE — Letter (Signed)
Summary: Device-Delinquent Check  [facility], Main Office  [address]   [place], [place] [address]   Phone: [phone]  Fax: [phone]     April 04, 2010 MRN: [id number]   MATEJ SAPPENFIELD [address] [place], [place]  [address]   Dear Mr. CARLINE,  According to our records, you have not had your implanted device checked in the recommended period of time.  We are unable to determine appropriate device function without checking your device on a regular basis.  Please call our office to schedule an appointment as soon as possible.  If you are having your device checked by another physician, please call us so that we may update our records.  Thank you,   Architectural technologist [facility]

## 2010-10-06 NOTE — Assessment & Plan Note (Signed)
Summary: walk in   chest pain  pf,RN  Nurse Visit   Vital Signs:  Patient profile:   60 year old male Height:      66 inches Pulse rate:   75 / minute Pulse rhythm:   regular PACED BP sitting:   122 / 68  (left arm) CC: walk in  with chest pain Is Patient Diabetic? Yes Pain Assessment Patient in pain? yes      Comments PT WALKED IN TO SCHEDULE APPT AND C/O CP THAT HAS BEEN PRESENT OFF AND ON FOR 2 TO 3 WEEKS.  NO SOB, NO N/V.  PT DESCRIBES CHEST PAIN IN THE CENTER OF HIS CHEST RADIATING UNDER LEFT BREAT WITH A HOLLOW FEELING IN HIS LEFT ARMPIT. AT TIMES IT IS SHARP.  S/S ARE RELIEVED BY NOTHING.  PT DOESN'T HAVE SL NTG TO USE.  PT STATES HE HAS BEEN UNDER ALOT OF STRESS RECENTLY.  VS, S/S, HISTORY AND EKG REVIEWED WITH DR Graciela Husbands WHO GAVE ORDERS FOR THE PT TO BE TRANSPORTED TO THE ED AT [facility] FOR EVAL.  PT WAS NOTIFIED, EMS WAS ACTIVATED AND PT WAS TRANSFERED.    Allergies: 1)  ! Nsaids 2)  ! Benadryl 3)  ! Diovan 4)  ! Ace Inhibitors  Orders Added: 1)  EKG w/ Interpretation [93000]

## 2010-10-06 NOTE — Letter (Signed)
Summary: Device-Delinquent Check  [facility], Main Office  [address]   [place], [place] [address]   Phone: [phone]  Fax: [phone]     June 03, 2010 MRN: [id number]   Andre Malone [address] [place], [place]  [address]   Dear Mr. REVARD,  According to our records, you have not had your implanted device checked in the recommended period of time.  We are unable to determine appropriate device function without checking your device on a regular basis.  Please call our office to schedule an appointment with  Dr Graciela Husbands,   as soon as possible.  If you are having your device checked by another physician, please call us so that we may update our records.  Thank you,  Letta Moynahan, EMT  June 03, 2010 12:25 PM  ] [facility]  Certified

## 2010-10-06 NOTE — Cardiovascular Report (Signed)
Summary: Certified Letter Signed - Patient  Certified Letter Signed - Patient   Imported By: Debby Freiberg 07/12/2010 12:47:16  _____________________________________________________________________  External Attachment:    Type:   Image     Comment:   External Document

## 2010-11-13 ENCOUNTER — Emergency Department (HOSPITAL_COMMUNITY): Payer: Medicare Other

## 2010-11-13 ENCOUNTER — Emergency Department (HOSPITAL_COMMUNITY)
Admission: EM | Admit: 2010-11-13 | Discharge: 2010-11-13 | Disposition: A | Discharge status: Home / Self Care | Payer: Medicare Other | Attending: Emergency Medicine | Admitting: Emergency Medicine

## 2010-11-13 DIAGNOSIS — Z87442 Personal history of urinary calculi: Secondary | ICD-10-CM | POA: Insufficient documentation

## 2010-11-13 DIAGNOSIS — R110 Nausea: Secondary | ICD-10-CM | POA: Insufficient documentation

## 2010-11-13 DIAGNOSIS — R079 Chest pain, unspecified: Secondary | ICD-10-CM | POA: Insufficient documentation

## 2010-11-13 DIAGNOSIS — G2581 Restless legs syndrome: Secondary | ICD-10-CM | POA: Insufficient documentation

## 2010-11-13 DIAGNOSIS — M542 Cervicalgia: Secondary | ICD-10-CM | POA: Insufficient documentation

## 2010-11-13 DIAGNOSIS — Z950 Presence of cardiac pacemaker: Secondary | ICD-10-CM | POA: Insufficient documentation

## 2010-11-13 DIAGNOSIS — R42 Dizziness and giddiness: Secondary | ICD-10-CM | POA: Insufficient documentation

## 2010-11-13 DIAGNOSIS — W1809XA Striking against other object with subsequent fall, initial encounter: Secondary | ICD-10-CM | POA: Insufficient documentation

## 2010-11-13 DIAGNOSIS — E119 Type 2 diabetes mellitus without complications: Secondary | ICD-10-CM | POA: Insufficient documentation

## 2010-11-13 DIAGNOSIS — R55 Syncope and collapse: Secondary | ICD-10-CM | POA: Insufficient documentation

## 2010-11-13 LAB — PROTIME-INR
INR: 1.4 (ref 0.00–1.49)
Prothrombin Time: 17.4 seconds — ABNORMAL HIGH (ref 11.6–15.2)

## 2010-11-13 LAB — DIFFERENTIAL
Lymphocytes Relative: 28 % (ref 12–46)
Lymphs Abs: 2 10*3/uL (ref 0.7–4.0)
Monocytes Absolute: 0.8 10*3/uL (ref 0.1–1.0)
Monocytes Relative: 11 % (ref 3–12)
Neutro Abs: 4.1 10*3/uL (ref 1.7–7.7)

## 2010-11-13 LAB — BASIC METABOLIC PANEL
BUN: 26 mg/dL — ABNORMAL HIGH (ref 6–23)
Calcium: 9.4 mg/dL (ref 8.4–10.5)
GFR calc non Af Amer: >60 mL/min (ref >60)
Potassium: 3.9 mEq/L (ref 3.5–5.1)
Sodium: 136 mEq/L (ref 135–145)

## 2010-11-13 LAB — CBC
HCT: 42 % (ref 39.0–52.0)
Hemoglobin: 13.9 g/dL (ref 13.0–17.0)
MCH: 29.4 pg (ref 26.0–34.0)
MCHC: 33.1 g/dL (ref 30.0–36.0)

## 2010-11-14 LAB — URINE MICROSCOPIC-ADD ON

## 2010-11-14 LAB — DIFFERENTIAL
Basophils Absolute: 0.1 10*3/uL (ref 0.0–0.1)
Basophils Relative: 1 % (ref 0–1)
Eosinophils Absolute: 0.2 10*3/uL (ref 0.0–0.7)
Eosinophils Relative: 2 % (ref 0–5)
Lymphocytes Relative: 18 % (ref 12–46)
Lymphs Abs: 1.9 10*3/uL (ref 0.7–4.0)
Monocytes Absolute: 1.1 10*3/uL — ABNORMAL HIGH (ref 0.1–1.0)
Monocytes Relative: 10 % (ref 3–12)
Neutro Abs: 7.7 10*3/uL (ref 1.7–7.7)
Neutrophils Relative %: 70 % (ref 43–77)

## 2010-11-14 LAB — URINALYSIS, ROUTINE W REFLEX MICROSCOPIC
Bilirubin Urine: NEGATIVE
Glucose, UA: >1000 mg/dL — AB
Ketones, ur: NEGATIVE mg/dL
Nitrite: NEGATIVE
Protein, ur: 100 mg/dL — AB
Specific Gravity, Urine: 1.027 (ref 1.005–1.030)
Urobilinogen, UA: 0.2 mg/dL (ref 0.0–1.0)
pH: 5 (ref 5.0–8.0)

## 2010-11-14 LAB — URINE CULTURE
Colony Count: NO GROWTH
Culture  Setup Time: 201201031719
Culture: NO GROWTH

## 2010-11-14 LAB — CBC
HCT: 47.3 % (ref 39.0–52.0)
Hemoglobin: 16.4 g/dL (ref 13.0–17.0)
MCH: 30.7 pg (ref 26.0–34.0)
MCHC: 34.7 g/dL (ref 30.0–36.0)
MCV: 88.6 fL (ref 78.0–100.0)
Platelets: 223 10*3/uL (ref 150–400)
RBC: 5.34 MIL/uL (ref 4.22–5.81)
RDW: 13.5 % (ref 11.5–15.5)
WBC: 11 10*3/uL — ABNORMAL HIGH (ref 4.0–10.5)

## 2010-11-14 LAB — COMPREHENSIVE METABOLIC PANEL
ALT: 21 U/L (ref 0–53)
AST: 30 U/L (ref 0–37)
Albumin: 4.3 g/dL (ref 3.5–5.2)
Alkaline Phosphatase: 52 U/L (ref 39–117)
BUN: 19 mg/dL (ref 6–23)
CO2: 24 mEq/L (ref 19–32)
Calcium: 10.1 mg/dL (ref 8.4–10.5)
Chloride: 105 mEq/L (ref 96–112)
Creatinine, Ser: 1.25 mg/dL (ref 0.4–1.5)
GFR calc Af Amer: >60 mL/min (ref >60)
GFR calc non Af Amer: 59 mL/min — ABNORMAL LOW (ref >60)
Glucose, Bld: 244 mg/dL — ABNORMAL HIGH (ref 70–99)
Potassium: 4.2 mEq/L (ref 3.5–5.1)
Sodium: 137 mEq/L (ref 135–145)
Total Bilirubin: 0.7 mg/dL (ref 0.3–1.2)
Total Protein: 7.7 g/dL (ref 6.0–8.3)

## 2010-11-17 ENCOUNTER — Emergency Department (HOSPITAL_COMMUNITY): Payer: Medicare Other

## 2010-11-17 ENCOUNTER — Emergency Department (HOSPITAL_COMMUNITY)
Admission: EM | Admit: 2010-11-17 | Discharge: 2010-11-17 | Disposition: A | Discharge status: Home / Self Care | Payer: Medicare Other | Attending: Emergency Medicine | Admitting: Emergency Medicine

## 2010-11-17 DIAGNOSIS — R109 Unspecified abdominal pain: Secondary | ICD-10-CM | POA: Insufficient documentation

## 2010-11-17 DIAGNOSIS — M549 Dorsalgia, unspecified: Secondary | ICD-10-CM | POA: Insufficient documentation

## 2010-11-17 DIAGNOSIS — G8929 Other chronic pain: Secondary | ICD-10-CM | POA: Insufficient documentation

## 2010-11-17 LAB — URINALYSIS, ROUTINE W REFLEX MICROSCOPIC
Ketones, ur: NEGATIVE mg/dL
Ketones, ur: NEGATIVE mg/dL
Nitrite: NEGATIVE
Protein, ur: NEGATIVE mg/dL
Urobilinogen, UA: 0.2 mg/dL (ref 0.0–1.0)
Urobilinogen, UA: 0.2 mg/dL (ref 0.0–1.0)
pH: 5.5 (ref 5.0–8.0)

## 2010-11-17 LAB — PROTIME-INR
INR: 1.98 — ABNORMAL HIGH (ref 0.00–1.49)
Prothrombin Time: 22.7 seconds — ABNORMAL HIGH (ref 11.6–15.2)

## 2010-11-17 LAB — CBC
MCH: 29.3 pg (ref 26.0–34.0)
MCHC: 34.7 g/dL (ref 30.0–36.0)
MCV: 84.3 fL (ref 78.0–100.0)
Platelets: 147 10*3/uL — ABNORMAL LOW (ref 150–400)
RBC: 4.51 MIL/uL (ref 4.22–5.81)
RDW: 13.7 % (ref 11.5–15.5)

## 2010-11-17 LAB — BASIC METABOLIC PANEL
BUN: 16 mg/dL (ref 6–23)
Calcium: 9.2 mg/dL (ref 8.4–10.5)
Creatinine, Ser: 1.29 mg/dL (ref 0.4–1.5)
GFR calc Af Amer: >60 mL/min (ref >60)
GFR calc non Af Amer: 57 mL/min — ABNORMAL LOW (ref >60)

## 2010-11-17 LAB — DIFFERENTIAL
Basophils Relative: 0 % (ref 0–1)
Eosinophils Absolute: 0.2 10*3/uL (ref 0.0–0.7)
Eosinophils Relative: 2 % (ref 0–5)
Monocytes Absolute: 0.8 10*3/uL (ref 0.1–1.0)
Monocytes Relative: 13 % — ABNORMAL HIGH (ref 3–12)
Neutrophils Relative %: 59 % (ref 43–77)

## 2010-11-17 LAB — URINE CULTURE: Colony Count: NO GROWTH

## 2010-11-17 LAB — POCT I-STAT, CHEM 8
BUN: 21 mg/dL (ref 6–23)
Calcium, Ion: 1.21 mmol/L (ref 1.12–1.32)
Chloride: 107 mEq/L (ref 96–112)
HCT: 40 % (ref 39.0–52.0)
Sodium: 137 mEq/L (ref 135–145)
TCO2: 21 mmol/L (ref 0–100)

## 2010-11-19 LAB — GLUCOSE, CAPILLARY: Glucose-Capillary: 106 mg/dL — ABNORMAL HIGH (ref 70–99)

## 2010-11-19 LAB — DIFFERENTIAL
Basophils Absolute: 0 10*3/uL (ref 0.0–0.1)
Basophils Absolute: 0 10*3/uL (ref 0.0–0.1)
Eosinophils Relative: 2 % (ref 0–5)
Lymphocytes Relative: 18 % (ref 12–46)
Lymphocytes Relative: 26 % (ref 12–46)
Lymphs Abs: 1.6 10*3/uL (ref 0.7–4.0)
Lymphs Abs: 2 10*3/uL (ref 0.7–4.0)
Monocytes Absolute: 0.7 10*3/uL (ref 0.1–1.0)
Monocytes Absolute: 0.8 10*3/uL (ref 0.1–1.0)
Monocytes Relative: 8 % (ref 3–12)
Neutro Abs: 4.7 10*3/uL (ref 1.7–7.7)
Neutro Abs: 6.4 10*3/uL (ref 1.7–7.7)

## 2010-11-19 LAB — URINALYSIS, ROUTINE W REFLEX MICROSCOPIC
Glucose, UA: 250 mg/dL — AB
Ketones, ur: NEGATIVE mg/dL
Nitrite: NEGATIVE
Protein, ur: NEGATIVE mg/dL
pH: 5 (ref 5.0–8.0)

## 2010-11-19 LAB — COMPREHENSIVE METABOLIC PANEL
ALT: 24 U/L (ref 0–53)
AST: 30 U/L (ref 0–37)
Albumin: 4.6 g/dL (ref 3.5–5.2)
CO2: 26 mEq/L (ref 19–32)
Calcium: 10 mg/dL (ref 8.4–10.5)
Creatinine, Ser: 1.05 mg/dL (ref 0.4–1.5)
GFR calc Af Amer: >60 mL/min (ref >60)
GFR calc non Af Amer: >60 mL/min (ref >60)
Sodium: 140 mEq/L (ref 135–145)
Total Protein: 8.3 g/dL (ref 6.0–8.3)

## 2010-11-19 LAB — RAPID URINE DRUG SCREEN, HOSP PERFORMED
Amphetamines: NOT DETECTED
Benzodiazepines: POSITIVE — AB
Cocaine: NOT DETECTED
Opiates: NOT DETECTED
Tetrahydrocannabinol: NOT DETECTED

## 2010-11-19 LAB — CBC
HCT: 38.5 % — ABNORMAL LOW (ref 39.0–52.0)
MCH: 30.5 pg (ref 26.0–34.0)
MCHC: 33.5 g/dL (ref 30.0–36.0)
MCV: 89.5 fL (ref 78.0–100.0)
Platelets: 139 10*3/uL — ABNORMAL LOW (ref 150–400)
RBC: 4.3 MIL/uL (ref 4.22–5.81)
RDW: 15 % (ref 11.5–15.5)
RDW: 15.2 % (ref 11.5–15.5)
WBC: 7.7 10*3/uL (ref 4.0–10.5)

## 2010-11-19 LAB — PROTIME-INR
INR: 1.72 — ABNORMAL HIGH (ref 0.00–1.49)
Prothrombin Time: 19.2 seconds — ABNORMAL HIGH (ref 11.6–15.2)

## 2010-11-19 LAB — BASIC METABOLIC PANEL
BUN: 11 mg/dL (ref 6–23)
Chloride: 109 mEq/L (ref 96–112)
GFR calc Af Amer: >60 mL/min (ref >60)
GFR calc non Af Amer: >60 mL/min (ref >60)
Potassium: 3.8 mEq/L (ref 3.5–5.1)
Sodium: 138 mEq/L (ref 135–145)

## 2010-11-19 LAB — HEMOGLOBIN A1C: Mean Plasma Glucose: 197 mg/dL — ABNORMAL HIGH (ref <117)

## 2010-11-19 LAB — ETHANOL: Alcohol, Ethyl (B): <5 mg/dL (ref 0–10)

## 2010-11-19 LAB — MAGNESIUM: Magnesium: 1.8 mg/dL (ref 1.5–2.5)

## 2010-11-19 LAB — URINE CULTURE

## 2010-11-19 LAB — CULTURE, BLOOD (ROUTINE X 2)
Culture: NO GROWTH
Culture: NO GROWTH

## 2010-11-19 LAB — VITAMIN B12: Vitamin B-12: 429 pg/mL (ref 211–911)

## 2010-11-19 LAB — FOLATE: Folate: >20 ng/mL

## 2010-11-19 LAB — APTT: aPTT: 32 seconds (ref 24–37)

## 2010-11-20 LAB — DIFFERENTIAL
Basophils Absolute: 0 10*3/uL (ref 0.0–0.1)
Basophils Absolute: 0 10*3/uL (ref 0.0–0.1)
Basophils Relative: 0 % (ref 0–1)
Basophils Relative: 0 % (ref 0–1)
Eosinophils Absolute: 0.2 10*3/uL (ref 0.0–0.7)
Lymphocytes Relative: 20 % (ref 12–46)
Monocytes Absolute: 0.8 10*3/uL (ref 0.1–1.0)
Monocytes Relative: 11 % (ref 3–12)
Neutro Abs: 5.5 10*3/uL (ref 1.7–7.7)
Neutro Abs: 6 10*3/uL (ref 1.7–7.7)
Neutrophils Relative %: 65 % (ref 43–77)
Neutrophils Relative %: 68 % (ref 43–77)

## 2010-11-20 LAB — URINE CULTURE

## 2010-11-20 LAB — URINALYSIS, ROUTINE W REFLEX MICROSCOPIC
Glucose, UA: 250 mg/dL — AB
Glucose, UA: NEGATIVE mg/dL
Leukocytes, UA: NEGATIVE
Leukocytes, UA: NEGATIVE
Nitrite: NEGATIVE
Protein, ur: >300 mg/dL — AB
Specific Gravity, Urine: 1.03 (ref 1.005–1.030)
Specific Gravity, Urine: 1.03 (ref 1.005–1.030)
pH: 5 (ref 5.0–8.0)
pH: 6.5 (ref 5.0–8.0)

## 2010-11-20 LAB — GLUCOSE, CAPILLARY
Glucose-Capillary: 179 mg/dL — ABNORMAL HIGH (ref 70–99)
Glucose-Capillary: 209 mg/dL — ABNORMAL HIGH (ref 70–99)
Glucose-Capillary: 232 mg/dL — ABNORMAL HIGH (ref 70–99)

## 2010-11-20 LAB — CBC
HCT: 40.3 % (ref 39.0–52.0)
HCT: 44 % (ref 39.0–52.0)
Hemoglobin: 13.5 g/dL (ref 13.0–17.0)
MCHC: 33.5 g/dL (ref 30.0–36.0)
MCV: 91.6 fL (ref 78.0–100.0)
RBC: 4.4 MIL/uL (ref 4.22–5.81)
RDW: 15.5 % (ref 11.5–15.5)
RDW: 15.6 % — ABNORMAL HIGH (ref 11.5–15.5)
WBC: 8.1 10*3/uL (ref 4.0–10.5)
WBC: 9.3 10*3/uL (ref 4.0–10.5)

## 2010-11-20 LAB — BASIC METABOLIC PANEL
CO2: 22 mEq/L (ref 19–32)
Chloride: 106 mEq/L (ref 96–112)
Creatinine, Ser: 0.83 mg/dL (ref 0.4–1.5)
GFR calc Af Amer: >60 mL/min (ref >60)

## 2010-11-20 LAB — COMPREHENSIVE METABOLIC PANEL
ALT: 65 U/L — ABNORMAL HIGH (ref 0–53)
Alkaline Phosphatase: 71 U/L (ref 39–117)
BUN: 10 mg/dL (ref 6–23)
CO2: 23 mEq/L (ref 19–32)
Chloride: 111 mEq/L (ref 96–112)
Glucose, Bld: 146 mg/dL — ABNORMAL HIGH (ref 70–99)
Potassium: 4 mEq/L (ref 3.5–5.1)
Sodium: 138 mEq/L (ref 135–145)
Total Bilirubin: 0.6 mg/dL (ref 0.3–1.2)
Total Protein: 7.4 g/dL (ref 6.0–8.3)

## 2010-11-20 LAB — HEMOGLOBIN A1C: Hgb A1c MFr Bld: 8.4 % — ABNORMAL HIGH (ref <5.7)

## 2010-11-20 LAB — TSH: TSH: 8.538 u[IU]/mL — ABNORMAL HIGH (ref 0.350–4.500)

## 2010-11-20 LAB — CARDIAC PANEL(CRET KIN+CKTOT+MB+TROPI): Relative Index: 6.4 — ABNORMAL HIGH (ref 0.0–2.5)

## 2010-11-20 LAB — PROTIME-INR
INR: 6.01 (ref 0.00–1.49)
Prothrombin Time: 53.2 seconds — ABNORMAL HIGH (ref 11.6–15.2)

## 2010-11-20 LAB — URINE MICROSCOPIC-ADD ON

## 2010-11-21 LAB — URINALYSIS, ROUTINE W REFLEX MICROSCOPIC
Bilirubin Urine: NEGATIVE
Glucose, UA: NEGATIVE mg/dL
Hgb urine dipstick: NEGATIVE
Hgb urine dipstick: NEGATIVE
Nitrite: NEGATIVE
Nitrite: NEGATIVE
Protein, ur: NEGATIVE mg/dL
Specific Gravity, Urine: 1.015 (ref 1.005–1.030)
Specific Gravity, Urine: >1.03 — ABNORMAL HIGH (ref 1.005–1.030)
Urobilinogen, UA: 0.2 mg/dL (ref 0.0–1.0)
Urobilinogen, UA: 0.2 mg/dL (ref 0.0–1.0)
pH: 5 (ref 5.0–8.0)

## 2010-11-21 LAB — DIFFERENTIAL
Eosinophils Absolute: 0.1 10*3/uL (ref 0.0–0.7)
Eosinophils Relative: 1 % (ref 0–5)
Lymphocytes Relative: 25 % (ref 12–46)
Lymphocytes Relative: 30 % (ref 12–46)
Lymphs Abs: 2.5 10*3/uL (ref 0.7–4.0)
Lymphs Abs: 3.3 10*3/uL (ref 0.7–4.0)
Neutro Abs: 6.5 10*3/uL (ref 1.7–7.7)
Neutrophils Relative %: 58 % (ref 43–77)
Neutrophils Relative %: 63 % (ref 43–77)

## 2010-11-21 LAB — PROTIME-INR
INR: 1.64 — ABNORMAL HIGH (ref 0.00–1.49)
INR: 1.84 — ABNORMAL HIGH (ref 0.00–1.49)
Prothrombin Time: 21.1 seconds — ABNORMAL HIGH (ref 11.6–15.2)

## 2010-11-21 LAB — POCT CARDIAC MARKERS
CKMB, poc: 1.5 ng/mL (ref 1.0–8.0)
Troponin i, poc: <0.05 ng/mL (ref 0.00–0.09)

## 2010-11-21 LAB — CBC
HCT: 42.1 % (ref 39.0–52.0)
MCV: 90.9 fL (ref 78.0–100.0)
Platelets: 187 10*3/uL (ref 150–400)
Platelets: 213 10*3/uL (ref 150–400)
WBC: 10.3 10*3/uL (ref 4.0–10.5)
WBC: 11.3 10*3/uL — ABNORMAL HIGH (ref 4.0–10.5)

## 2010-11-21 LAB — BASIC METABOLIC PANEL
BUN: 14 mg/dL (ref 6–23)
BUN: 9 mg/dL (ref 6–23)
Calcium: 9.8 mg/dL (ref 8.4–10.5)
Creatinine, Ser: 0.91 mg/dL (ref 0.4–1.5)
GFR calc non Af Amer: >60 mL/min (ref >60)
GFR calc non Af Amer: >60 mL/min (ref >60)
Potassium: 3.4 mEq/L — ABNORMAL LOW (ref 3.5–5.1)

## 2010-11-21 LAB — ETHANOL: Alcohol, Ethyl (B): 130 mg/dL — ABNORMAL HIGH (ref 0–10)

## 2010-11-23 LAB — CBC
HCT: 36.1 % — ABNORMAL LOW (ref 39.0–52.0)
HCT: 38.2 % — ABNORMAL LOW (ref 39.0–52.0)
Hemoglobin: 12.9 g/dL — ABNORMAL LOW (ref 13.0–17.0)
MCHC: 34.5 g/dL (ref 30.0–36.0)
MCHC: 35.1 g/dL (ref 30.0–36.0)
MCHC: 35.1 g/dL (ref 30.0–36.0)
MCV: 87.2 fL (ref 78.0–100.0)
MCV: 88.9 fL (ref 78.0–100.0)
Platelets: 162 10*3/uL (ref 150–400)
Platelets: 182 10*3/uL (ref 150–400)
RBC: 4.23 MIL/uL (ref 4.22–5.81)
RDW: 13.8 % (ref 11.5–15.5)
WBC: 6.8 10*3/uL (ref 4.0–10.5)
WBC: 8.7 10*3/uL (ref 4.0–10.5)

## 2010-11-23 LAB — CARDIAC PANEL(CRET KIN+CKTOT+MB+TROPI)
CK, MB: 4 ng/mL (ref 0.3–4.0)
Relative Index: INVALID (ref 0.0–2.5)
Total CK: 53 U/L (ref 7–232)
Troponin I: 0.04 ng/mL (ref 0.00–0.06)

## 2010-11-23 LAB — HEMOGLOBIN A1C: Hgb A1c MFr Bld: 7.9 % — ABNORMAL HIGH (ref 4.6–6.1)

## 2010-11-23 LAB — GLUCOSE, CAPILLARY
Glucose-Capillary: 156 mg/dL — ABNORMAL HIGH (ref 70–99)
Glucose-Capillary: 156 mg/dL — ABNORMAL HIGH (ref 70–99)
Glucose-Capillary: 162 mg/dL — ABNORMAL HIGH (ref 70–99)
Glucose-Capillary: 177 mg/dL — ABNORMAL HIGH (ref 70–99)

## 2010-11-23 LAB — BASIC METABOLIC PANEL
BUN: 14 mg/dL (ref 6–23)
BUN: 14 mg/dL (ref 6–23)
CO2: 25 mEq/L (ref 19–32)
CO2: 25 mEq/L (ref 19–32)
CO2: 26 mEq/L (ref 19–32)
Calcium: 8.8 mg/dL (ref 8.4–10.5)
Calcium: 9.5 mg/dL (ref 8.4–10.5)
Chloride: 103 mEq/L (ref 96–112)
Chloride: 105 mEq/L (ref 96–112)
Creatinine, Ser: 1.06 mg/dL (ref 0.4–1.5)
GFR calc Af Amer: >60 mL/min (ref >60)
GFR calc non Af Amer: >60 mL/min (ref >60)
Glucose, Bld: 144 mg/dL — ABNORMAL HIGH (ref 70–99)
Potassium: 3.9 mEq/L (ref 3.5–5.1)
Potassium: 3.9 mEq/L (ref 3.5–5.1)
Sodium: 137 mEq/L (ref 135–145)

## 2010-11-23 LAB — RAPID URINE DRUG SCREEN, HOSP PERFORMED
Amphetamines: NOT DETECTED
Barbiturates: NOT DETECTED
Benzodiazepines: POSITIVE — AB
Tetrahydrocannabinol: NOT DETECTED

## 2010-11-23 LAB — URINE CULTURE

## 2010-11-23 LAB — CK: Total CK: 53 U/L (ref 7–232)

## 2010-11-23 LAB — DIFFERENTIAL
Basophils Absolute: 0 10*3/uL (ref 0.0–0.1)
Basophils Relative: 0 % (ref 0–1)
Basophils Relative: 1 % (ref 0–1)
Basophils Relative: 1 % (ref 0–1)
Eosinophils Absolute: 0.2 10*3/uL (ref 0.0–0.7)
Eosinophils Absolute: 0.2 10*3/uL (ref 0.0–0.7)
Eosinophils Relative: 4 % (ref 0–5)
Eosinophils Relative: 4 % (ref 0–5)
Lymphocytes Relative: 30 % (ref 12–46)
Lymphs Abs: 1.9 10*3/uL (ref 0.7–4.0)
Lymphs Abs: 2.2 10*3/uL (ref 0.7–4.0)
Monocytes Absolute: 0.8 10*3/uL (ref 0.1–1.0)
Monocytes Relative: 10 % (ref 3–12)
Monocytes Relative: 9 % (ref 3–12)
Neutro Abs: 5.4 10*3/uL (ref 1.7–7.7)
Neutrophils Relative %: 58 % (ref 43–77)
Neutrophils Relative %: 62 % (ref 43–77)

## 2010-11-23 LAB — APTT: aPTT: 36 seconds (ref 24–37)

## 2010-11-23 LAB — LIPID PANEL
Cholesterol: 159 mg/dL (ref 0–200)
Cholesterol: 172 mg/dL (ref 0–200)
HDL: 30 mg/dL — ABNORMAL LOW (ref >39)
LDL Cholesterol: 65 mg/dL (ref 0–99)
Total CHOL/HDL Ratio: 5.7 RATIO
VLDL: UNDETERMINED mg/dL (ref 0–40)

## 2010-11-23 LAB — POCT CARDIAC MARKERS
CKMB, poc: 1.7 ng/mL (ref 1.0–8.0)
Myoglobin, poc: 82.1 ng/mL (ref 12–200)
Myoglobin, poc: 86.5 ng/mL (ref 12–200)

## 2010-11-23 LAB — ETHANOL: Alcohol, Ethyl (B): <5 mg/dL (ref 0–10)

## 2010-11-23 LAB — PROTIME-INR
INR: 2.02 — ABNORMAL HIGH (ref 0.00–1.49)
INR: 2.39 — ABNORMAL HIGH (ref 0.00–1.49)
Prothrombin Time: 25.9 seconds — ABNORMAL HIGH (ref 11.6–15.2)

## 2010-11-23 LAB — TROPONIN I: Troponin I: 0.02 ng/mL (ref 0.00–0.06)

## 2010-11-23 LAB — URINALYSIS, ROUTINE W REFLEX MICROSCOPIC
Bilirubin Urine: NEGATIVE
Hgb urine dipstick: NEGATIVE
Specific Gravity, Urine: 1.03 (ref 1.005–1.030)
pH: 5 (ref 5.0–8.0)

## 2010-11-23 LAB — HEPATIC FUNCTION PANEL
ALT: 32 U/L (ref 0–53)
AST: 34 U/L (ref 0–37)
Bilirubin, Direct: 0.1 mg/dL (ref 0.0–0.3)
Total Protein: 6.7 g/dL (ref 6.0–8.3)

## 2010-11-23 LAB — TSH: TSH: 2.024 u[IU]/mL (ref 0.350–4.500)

## 2010-11-23 LAB — MRSA PCR SCREENING: MRSA by PCR: NEGATIVE

## 2010-11-23 LAB — CK TOTAL AND CKMB (NOT AT ARMC): Relative Index: INVALID (ref 0.0–2.5)

## 2010-11-28 LAB — RAPID URINE DRUG SCREEN, HOSP PERFORMED
Amphetamines: NOT DETECTED
Barbiturates: NOT DETECTED
Benzodiazepines: POSITIVE — AB

## 2010-11-28 LAB — CBC
HCT: 41.4 % (ref 39.0–52.0)
Hemoglobin: 14.6 g/dL (ref 13.0–17.0)
MCV: 87.9 fL (ref 78.0–100.0)
Platelets: 177 10*3/uL (ref 150–400)
RDW: 13.9 % (ref 11.5–15.5)

## 2010-11-28 LAB — DIFFERENTIAL
Basophils Absolute: 0.1 10*3/uL (ref 0.0–0.1)
Eosinophils Absolute: 0.2 10*3/uL (ref 0.0–0.7)
Eosinophils Relative: 3 % (ref 0–5)
Monocytes Absolute: 0.8 10*3/uL (ref 0.1–1.0)

## 2010-11-28 LAB — ETHANOL: Alcohol, Ethyl (B): <5 mg/dL (ref 0–10)

## 2010-11-28 LAB — GLUCOSE, CAPILLARY: Glucose-Capillary: 180 mg/dL — ABNORMAL HIGH (ref 70–99)

## 2010-11-28 LAB — BASIC METABOLIC PANEL
BUN: 12 mg/dL (ref 6–23)
Creatinine, Ser: 0.97 mg/dL (ref 0.4–1.5)
GFR calc Af Amer: >60 mL/min (ref >60)
GFR calc non Af Amer: >60 mL/min (ref >60)

## 2010-11-28 LAB — PROTIME-INR: Prothrombin Time: 20.6 seconds — ABNORMAL HIGH (ref 11.6–15.2)

## 2010-11-28 LAB — APTT: aPTT: 30 seconds (ref 24–37)

## 2010-12-09 LAB — POCT I-STAT 3, ART BLOOD GAS (G3+)
Acid-Base Excess: 1 mmol/L (ref 0.0–2.0)
Acid-base deficit: 1 mmol/L (ref 0.0–2.0)
Acid-base deficit: 1 mmol/L (ref 0.0–2.0)
Acid-base deficit: 4 mmol/L — ABNORMAL HIGH (ref 0.0–2.0)
Bicarbonate: 20.9 mEq/L (ref 20.0–24.0)
Bicarbonate: 23.4 mEq/L (ref 20.0–24.0)
Bicarbonate: 23.4 mEq/L (ref 20.0–24.0)
Bicarbonate: 23.6 mEq/L (ref 20.0–24.0)
Bicarbonate: 26 mEq/L — ABNORMAL HIGH (ref 20.0–24.0)
Bicarbonate: 26.2 mEq/L — ABNORMAL HIGH (ref 20.0–24.0)
Bicarbonate: 28 mEq/L — ABNORMAL HIGH (ref 20.0–24.0)
O2 Saturation: 100 %
O2 Saturation: 100 %
O2 Saturation: 100 %
O2 Saturation: 84 %
O2 Saturation: 93 %
O2 Saturation: 96 %
O2 Saturation: 99 %
Patient temperature: 35
Patient temperature: 36
Patient temperature: 98.8
Patient temperature: 98.8
Patient temperature: 98.9
TCO2: 22 mmol/L (ref 0–100)
TCO2: 25 mmol/L (ref 0–100)
TCO2: 25 mmol/L (ref 0–100)
TCO2: 27 mmol/L (ref 0–100)
TCO2: 28 mmol/L (ref 0–100)
TCO2: 28 mmol/L (ref 0–100)
TCO2: 29 mmol/L (ref 0–100)
pCO2 arterial: 33.1 mmHg — ABNORMAL LOW (ref 35.0–45.0)
pCO2 arterial: 33.4 mmHg — ABNORMAL LOW (ref 35.0–45.0)
pCO2 arterial: 34 mmHg — ABNORMAL LOW (ref 35.0–45.0)
pCO2 arterial: 36.6 mmHg (ref 35.0–45.0)
pCO2 arterial: 40 mmHg (ref 35.0–45.0)
pCO2 arterial: 44.3 mmHg (ref 35.0–45.0)
pCO2 arterial: 48.3 mmHg — ABNORMAL HIGH (ref 35.0–45.0)
pH, Arterial: 7.364 (ref 7.350–7.450)
pH, Arterial: 7.484 — ABNORMAL HIGH (ref 7.350–7.450)
pH, Arterial: 7.535 — ABNORMAL HIGH (ref 7.350–7.450)
pO2, Arterial: 110 mmHg — ABNORMAL HIGH (ref 80.0–100.0)
pO2, Arterial: 122 mmHg — ABNORMAL HIGH (ref 80.0–100.0)
pO2, Arterial: 353 mmHg — ABNORMAL HIGH (ref 80.0–100.0)
pO2, Arterial: 50 mmHg — ABNORMAL LOW (ref 80.0–100.0)
pO2, Arterial: 89 mmHg (ref 80.0–100.0)

## 2010-12-09 LAB — CBC
HCT: 23.3 % — ABNORMAL LOW (ref 39.0–52.0)
HCT: 24.9 % — ABNORMAL LOW (ref 39.0–52.0)
HCT: 25.3 % — ABNORMAL LOW (ref 39.0–52.0)
HCT: 25.9 % — ABNORMAL LOW (ref 39.0–52.0)
HCT: 26 % — ABNORMAL LOW (ref 39.0–52.0)
HCT: 28.4 % — ABNORMAL LOW (ref 39.0–52.0)
HCT: 29 % — ABNORMAL LOW (ref 39.0–52.0)
HCT: 30.9 % — ABNORMAL LOW (ref 39.0–52.0)
HCT: 40.4 % (ref 39.0–52.0)
HCT: 41.5 % (ref 39.0–52.0)
HCT: 45.1 % (ref 39.0–52.0)
Hemoglobin: 10.1 g/dL — ABNORMAL LOW (ref 13.0–17.0)
Hemoglobin: 10.5 g/dL — ABNORMAL LOW (ref 13.0–17.0)
Hemoglobin: 14.2 g/dL (ref 13.0–17.0)
Hemoglobin: 15.4 g/dL (ref 13.0–17.0)
Hemoglobin: 6.9 g/dL — CL (ref 13.0–17.0)
Hemoglobin: 7.8 g/dL — CL (ref 13.0–17.0)
Hemoglobin: 8 g/dL — ABNORMAL LOW (ref 13.0–17.0)
Hemoglobin: 8.1 g/dL — ABNORMAL LOW (ref 13.0–17.0)
Hemoglobin: 8.4 g/dL — ABNORMAL LOW (ref 13.0–17.0)
Hemoglobin: 8.6 g/dL — ABNORMAL LOW (ref 13.0–17.0)
Hemoglobin: 8.8 g/dL — ABNORMAL LOW (ref 13.0–17.0)
Hemoglobin: 8.8 g/dL — ABNORMAL LOW (ref 13.0–17.0)
Hemoglobin: 9.5 g/dL — ABNORMAL LOW (ref 13.0–17.0)
MCHC: 33.4 g/dL (ref 30.0–36.0)
MCHC: 33.8 g/dL (ref 30.0–36.0)
MCHC: 33.8 g/dL (ref 30.0–36.0)
MCHC: 33.9 g/dL (ref 30.0–36.0)
MCHC: 33.9 g/dL (ref 30.0–36.0)
MCHC: 34 g/dL (ref 30.0–36.0)
MCHC: 34.3 g/dL (ref 30.0–36.0)
MCHC: 34.4 g/dL (ref 30.0–36.0)
MCHC: 34.7 g/dL (ref 30.0–36.0)
MCHC: 34.9 g/dL (ref 30.0–36.0)
MCV: 90.6 fL (ref 78.0–100.0)
MCV: 90.9 fL (ref 78.0–100.0)
MCV: 90.9 fL (ref 78.0–100.0)
MCV: 91.2 fL (ref 78.0–100.0)
MCV: 91.7 fL (ref 78.0–100.0)
MCV: 92.1 fL (ref 78.0–100.0)
MCV: 92.3 fL (ref 78.0–100.0)
MCV: 92.5 fL (ref 78.0–100.0)
Platelets: 100 10*3/uL — ABNORMAL LOW (ref 150–400)
Platelets: 105 10*3/uL — ABNORMAL LOW (ref 150–400)
Platelets: 107 10*3/uL — ABNORMAL LOW (ref 150–400)
Platelets: 123 10*3/uL — ABNORMAL LOW (ref 150–400)
Platelets: 128 10*3/uL — ABNORMAL LOW (ref 150–400)
Platelets: 132 10*3/uL — ABNORMAL LOW (ref 150–400)
Platelets: 134 10*3/uL — ABNORMAL LOW (ref 150–400)
Platelets: 143 10*3/uL — ABNORMAL LOW (ref 150–400)
Platelets: 171 10*3/uL (ref 150–400)
Platelets: 178 10*3/uL (ref 150–400)
Platelets: 182 10*3/uL (ref 150–400)
RBC: 2.17 MIL/uL — ABNORMAL LOW (ref 4.22–5.81)
RBC: 2.54 MIL/uL — ABNORMAL LOW (ref 4.22–5.81)
RBC: 2.58 MIL/uL — ABNORMAL LOW (ref 4.22–5.81)
RBC: 2.74 MIL/uL — ABNORMAL LOW (ref 4.22–5.81)
RBC: 2.8 MIL/uL — ABNORMAL LOW (ref 4.22–5.81)
RBC: 2.86 MIL/uL — ABNORMAL LOW (ref 4.22–5.81)
RBC: 3.12 MIL/uL — ABNORMAL LOW (ref 4.22–5.81)
RBC: 3.15 MIL/uL — ABNORMAL LOW (ref 4.22–5.81)
RBC: 3.24 MIL/uL — ABNORMAL LOW (ref 4.22–5.81)
RBC: 4.75 MIL/uL (ref 4.22–5.81)
RDW: 13.1 % (ref 11.5–15.5)
RDW: 13.4 % (ref 11.5–15.5)
RDW: 13.6 % (ref 11.5–15.5)
RDW: 14.6 % (ref 11.5–15.5)
RDW: 15.2 % (ref 11.5–15.5)
RDW: 15.4 % (ref 11.5–15.5)
RDW: 15.4 % (ref 11.5–15.5)
RDW: 15.4 % (ref 11.5–15.5)
RDW: 15.4 % (ref 11.5–15.5)
RDW: 15.6 % — ABNORMAL HIGH (ref 11.5–15.5)
RDW: 15.8 % — ABNORMAL HIGH (ref 11.5–15.5)
RDW: 15.8 % — ABNORMAL HIGH (ref 11.5–15.5)
WBC: 10.6 10*3/uL — ABNORMAL HIGH (ref 4.0–10.5)
WBC: 10.6 10*3/uL — ABNORMAL HIGH (ref 4.0–10.5)
WBC: 11.8 10*3/uL — ABNORMAL HIGH (ref 4.0–10.5)
WBC: 11.8 10*3/uL — ABNORMAL HIGH (ref 4.0–10.5)
WBC: 12.3 10*3/uL — ABNORMAL HIGH (ref 4.0–10.5)
WBC: 12.5 10*3/uL — ABNORMAL HIGH (ref 4.0–10.5)
WBC: 12.6 10*3/uL — ABNORMAL HIGH (ref 4.0–10.5)
WBC: 17.3 10*3/uL — ABNORMAL HIGH (ref 4.0–10.5)
WBC: 21.9 10*3/uL — ABNORMAL HIGH (ref 4.0–10.5)
WBC: 24 10*3/uL — ABNORMAL HIGH (ref 4.0–10.5)
WBC: 24.4 10*3/uL — ABNORMAL HIGH (ref 4.0–10.5)
WBC: 8.8 10*3/uL (ref 4.0–10.5)
WBC: 9.3 10*3/uL (ref 4.0–10.5)
WBC: 9.7 10*3/uL (ref 4.0–10.5)

## 2010-12-09 LAB — GLUCOSE, CAPILLARY
Glucose-Capillary: 100 mg/dL — ABNORMAL HIGH (ref 70–99)
Glucose-Capillary: 102 mg/dL — ABNORMAL HIGH (ref 70–99)
Glucose-Capillary: 103 mg/dL — ABNORMAL HIGH (ref 70–99)
Glucose-Capillary: 103 mg/dL — ABNORMAL HIGH (ref 70–99)
Glucose-Capillary: 111 mg/dL — ABNORMAL HIGH (ref 70–99)
Glucose-Capillary: 123 mg/dL — ABNORMAL HIGH (ref 70–99)
Glucose-Capillary: 129 mg/dL — ABNORMAL HIGH (ref 70–99)
Glucose-Capillary: 131 mg/dL — ABNORMAL HIGH (ref 70–99)
Glucose-Capillary: 133 mg/dL — ABNORMAL HIGH (ref 70–99)
Glucose-Capillary: 138 mg/dL — ABNORMAL HIGH (ref 70–99)
Glucose-Capillary: 140 mg/dL — ABNORMAL HIGH (ref 70–99)
Glucose-Capillary: 142 mg/dL — ABNORMAL HIGH (ref 70–99)
Glucose-Capillary: 147 mg/dL — ABNORMAL HIGH (ref 70–99)
Glucose-Capillary: 148 mg/dL — ABNORMAL HIGH (ref 70–99)
Glucose-Capillary: 151 mg/dL — ABNORMAL HIGH (ref 70–99)
Glucose-Capillary: 161 mg/dL — ABNORMAL HIGH (ref 70–99)
Glucose-Capillary: 166 mg/dL — ABNORMAL HIGH (ref 70–99)
Glucose-Capillary: 167 mg/dL — ABNORMAL HIGH (ref 70–99)
Glucose-Capillary: 168 mg/dL — ABNORMAL HIGH (ref 70–99)
Glucose-Capillary: 170 mg/dL — ABNORMAL HIGH (ref 70–99)
Glucose-Capillary: 171 mg/dL — ABNORMAL HIGH (ref 70–99)
Glucose-Capillary: 172 mg/dL — ABNORMAL HIGH (ref 70–99)
Glucose-Capillary: 172 mg/dL — ABNORMAL HIGH (ref 70–99)
Glucose-Capillary: 183 mg/dL — ABNORMAL HIGH (ref 70–99)
Glucose-Capillary: 187 mg/dL — ABNORMAL HIGH (ref 70–99)
Glucose-Capillary: 188 mg/dL — ABNORMAL HIGH (ref 70–99)
Glucose-Capillary: 188 mg/dL — ABNORMAL HIGH (ref 70–99)
Glucose-Capillary: 191 mg/dL — ABNORMAL HIGH (ref 70–99)
Glucose-Capillary: 192 mg/dL — ABNORMAL HIGH (ref 70–99)
Glucose-Capillary: 196 mg/dL — ABNORMAL HIGH (ref 70–99)
Glucose-Capillary: 196 mg/dL — ABNORMAL HIGH (ref 70–99)
Glucose-Capillary: 197 mg/dL — ABNORMAL HIGH (ref 70–99)
Glucose-Capillary: 197 mg/dL — ABNORMAL HIGH (ref 70–99)
Glucose-Capillary: 202 mg/dL — ABNORMAL HIGH (ref 70–99)
Glucose-Capillary: 203 mg/dL — ABNORMAL HIGH (ref 70–99)
Glucose-Capillary: 220 mg/dL — ABNORMAL HIGH (ref 70–99)
Glucose-Capillary: 246 mg/dL — ABNORMAL HIGH (ref 70–99)
Glucose-Capillary: 249 mg/dL — ABNORMAL HIGH (ref 70–99)
Glucose-Capillary: 75 mg/dL (ref 70–99)
Glucose-Capillary: 90 mg/dL (ref 70–99)

## 2010-12-09 LAB — BASIC METABOLIC PANEL
BUN: 10 mg/dL (ref 6–23)
BUN: 10 mg/dL (ref 6–23)
BUN: 13 mg/dL (ref 6–23)
BUN: 15 mg/dL (ref 6–23)
BUN: 20 mg/dL (ref 6–23)
BUN: 20 mg/dL (ref 6–23)
BUN: 25 mg/dL — ABNORMAL HIGH (ref 6–23)
BUN: 29 mg/dL — ABNORMAL HIGH (ref 6–23)
CO2: 24 mEq/L (ref 19–32)
CO2: 25 mEq/L (ref 19–32)
CO2: 25 mEq/L (ref 19–32)
CO2: 26 mEq/L (ref 19–32)
CO2: 27 mEq/L (ref 19–32)
Calcium: 7.2 mg/dL — ABNORMAL LOW (ref 8.4–10.5)
Calcium: 7.2 mg/dL — ABNORMAL LOW (ref 8.4–10.5)
Calcium: 7.3 mg/dL — ABNORMAL LOW (ref 8.4–10.5)
Calcium: 7.8 mg/dL — ABNORMAL LOW (ref 8.4–10.5)
Calcium: 7.9 mg/dL — ABNORMAL LOW (ref 8.4–10.5)
Calcium: 8 mg/dL — ABNORMAL LOW (ref 8.4–10.5)
Calcium: 8 mg/dL — ABNORMAL LOW (ref 8.4–10.5)
Calcium: 8.6 mg/dL (ref 8.4–10.5)
Calcium: 8.8 mg/dL (ref 8.4–10.5)
Calcium: 8.9 mg/dL (ref 8.4–10.5)
Chloride: 103 mEq/L (ref 96–112)
Chloride: 103 mEq/L (ref 96–112)
Chloride: 104 mEq/L (ref 96–112)
Chloride: 104 mEq/L (ref 96–112)
Chloride: 105 mEq/L (ref 96–112)
Chloride: 109 mEq/L (ref 96–112)
Chloride: 99 mEq/L (ref 96–112)
Creatinine, Ser: 1.06 mg/dL (ref 0.4–1.5)
Creatinine, Ser: 1.06 mg/dL (ref 0.4–1.5)
Creatinine, Ser: 1.28 mg/dL (ref 0.4–1.5)
Creatinine, Ser: 1.46 mg/dL (ref 0.4–1.5)
Creatinine, Ser: 1.63 mg/dL — ABNORMAL HIGH (ref 0.4–1.5)
Creatinine, Ser: 1.76 mg/dL — ABNORMAL HIGH (ref 0.4–1.5)
Creatinine, Ser: 1.76 mg/dL — ABNORMAL HIGH (ref 0.4–1.5)
GFR calc Af Amer: 49 mL/min — ABNORMAL LOW (ref >60)
GFR calc Af Amer: 49 mL/min — ABNORMAL LOW (ref >60)
GFR calc Af Amer: 53 mL/min — ABNORMAL LOW (ref >60)
GFR calc Af Amer: 55 mL/min — ABNORMAL LOW (ref >60)
GFR calc Af Amer: >60 mL/min (ref >60)
GFR calc Af Amer: >60 mL/min (ref >60)
GFR calc Af Amer: >60 mL/min (ref >60)
GFR calc Af Amer: >60 mL/min (ref >60)
GFR calc Af Amer: >60 mL/min (ref >60)
GFR calc Af Amer: >60 mL/min (ref >60)
GFR calc non Af Amer: 40 mL/min — ABNORMAL LOW (ref >60)
GFR calc non Af Amer: 44 mL/min — ABNORMAL LOW (ref >60)
GFR calc non Af Amer: 46 mL/min — ABNORMAL LOW (ref >60)
GFR calc non Af Amer: 50 mL/min — ABNORMAL LOW (ref >60)
GFR calc non Af Amer: 58 mL/min — ABNORMAL LOW (ref >60)
GFR calc non Af Amer: >60 mL/min (ref >60)
GFR calc non Af Amer: >60 mL/min (ref >60)
GFR calc non Af Amer: >60 mL/min (ref >60)
GFR calc non Af Amer: >60 mL/min (ref >60)
GFR calc non Af Amer: >60 mL/min (ref >60)
Glucose, Bld: 124 mg/dL — ABNORMAL HIGH (ref 70–99)
Glucose, Bld: 140 mg/dL — ABNORMAL HIGH (ref 70–99)
Glucose, Bld: 148 mg/dL — ABNORMAL HIGH (ref 70–99)
Glucose, Bld: 168 mg/dL — ABNORMAL HIGH (ref 70–99)
Glucose, Bld: 192 mg/dL — ABNORMAL HIGH (ref 70–99)
Glucose, Bld: 209 mg/dL — ABNORMAL HIGH (ref 70–99)
Glucose, Bld: 211 mg/dL — ABNORMAL HIGH (ref 70–99)
Glucose, Bld: 222 mg/dL — ABNORMAL HIGH (ref 70–99)
Potassium: 3.5 mEq/L (ref 3.5–5.1)
Potassium: 3.6 mEq/L (ref 3.5–5.1)
Potassium: 4.3 mEq/L (ref 3.5–5.1)
Potassium: 4.4 mEq/L (ref 3.5–5.1)
Potassium: 4.5 mEq/L (ref 3.5–5.1)
Potassium: 4.6 mEq/L (ref 3.5–5.1)
Potassium: 4.6 mEq/L (ref 3.5–5.1)
Potassium: 4.7 mEq/L (ref 3.5–5.1)
Sodium: 136 mEq/L (ref 135–145)
Sodium: 136 mEq/L (ref 135–145)
Sodium: 137 mEq/L (ref 135–145)
Sodium: 137 mEq/L (ref 135–145)
Sodium: 138 mEq/L (ref 135–145)
Sodium: 138 mEq/L (ref 135–145)
Sodium: 138 mEq/L (ref 135–145)
Sodium: 139 mEq/L (ref 135–145)
Sodium: 139 mEq/L (ref 135–145)
Sodium: 139 mEq/L (ref 135–145)

## 2010-12-09 LAB — BRAIN NATRIURETIC PEPTIDE: Pro B Natriuretic peptide (BNP): 508 pg/mL — ABNORMAL HIGH (ref 0.0–100.0)

## 2010-12-09 LAB — POCT I-STAT 4, (NA,K, GLUC, HGB,HCT)
Glucose, Bld: 113 mg/dL — ABNORMAL HIGH (ref 70–99)
Glucose, Bld: 120 mg/dL — ABNORMAL HIGH (ref 70–99)
Glucose, Bld: 152 mg/dL — ABNORMAL HIGH (ref 70–99)
Glucose, Bld: 156 mg/dL — ABNORMAL HIGH (ref 70–99)
Glucose, Bld: 215 mg/dL — ABNORMAL HIGH (ref 70–99)
Glucose, Bld: 99 mg/dL (ref 70–99)
HCT: 24 % — ABNORMAL LOW (ref 39.0–52.0)
HCT: 27 % — ABNORMAL LOW (ref 39.0–52.0)
HCT: 27 % — ABNORMAL LOW (ref 39.0–52.0)
HCT: 28 % — ABNORMAL LOW (ref 39.0–52.0)
HCT: 33 % — ABNORMAL LOW (ref 39.0–52.0)
HCT: 38 % — ABNORMAL LOW (ref 39.0–52.0)
Hemoglobin: 10.2 g/dL — ABNORMAL LOW (ref 13.0–17.0)
Hemoglobin: 12.9 g/dL — ABNORMAL LOW (ref 13.0–17.0)
Hemoglobin: 8.2 g/dL — ABNORMAL LOW (ref 13.0–17.0)
Hemoglobin: 9.2 g/dL — ABNORMAL LOW (ref 13.0–17.0)
Hemoglobin: 9.5 g/dL — ABNORMAL LOW (ref 13.0–17.0)
Hemoglobin: 9.5 g/dL — ABNORMAL LOW (ref 13.0–17.0)
Potassium: 3.4 mEq/L — ABNORMAL LOW (ref 3.5–5.1)
Potassium: 3.5 mEq/L (ref 3.5–5.1)
Potassium: 3.6 mEq/L (ref 3.5–5.1)
Potassium: 3.9 mEq/L (ref 3.5–5.1)
Potassium: 3.9 mEq/L (ref 3.5–5.1)
Potassium: 4.3 mEq/L (ref 3.5–5.1)
Potassium: 5.8 mEq/L — ABNORMAL HIGH (ref 3.5–5.1)
Sodium: 127 mEq/L — ABNORMAL LOW (ref 135–145)
Sodium: 128 mEq/L — ABNORMAL LOW (ref 135–145)
Sodium: 131 mEq/L — ABNORMAL LOW (ref 135–145)
Sodium: 136 mEq/L (ref 135–145)
Sodium: 136 mEq/L (ref 135–145)
Sodium: 137 mEq/L (ref 135–145)
Sodium: 140 mEq/L (ref 135–145)

## 2010-12-09 LAB — POCT CARDIAC MARKERS
CKMB, poc: 1.4 ng/mL (ref 1.0–8.0)
Myoglobin, poc: 90.3 ng/mL (ref 12–200)

## 2010-12-09 LAB — DIFFERENTIAL
Eosinophils Relative: 1 % (ref 0–5)
Lymphocytes Relative: 18 % (ref 12–46)
Lymphs Abs: 2.2 10*3/uL (ref 0.7–4.0)
Monocytes Absolute: 1.1 10*3/uL — ABNORMAL HIGH (ref 0.1–1.0)

## 2010-12-09 LAB — POCT I-STAT, CHEM 8
BUN: 14 mg/dL (ref 6–23)
BUN: 16 mg/dL (ref 6–23)
Calcium, Ion: 1.05 mmol/L — ABNORMAL LOW (ref 1.12–1.32)
Calcium, Ion: 1.07 mmol/L — ABNORMAL LOW (ref 1.12–1.32)
Chloride: 103 mEq/L (ref 96–112)
Chloride: 106 mEq/L (ref 96–112)
Creatinine, Ser: 0.7 mg/dL (ref 0.4–1.5)
Creatinine, Ser: 0.9 mg/dL (ref 0.4–1.5)
Glucose, Bld: 210 mg/dL — ABNORMAL HIGH (ref 70–99)
Glucose, Bld: 210 mg/dL — ABNORMAL HIGH (ref 70–99)
HCT: 22 % — ABNORMAL LOW (ref 39.0–52.0)
HCT: 29 % — ABNORMAL LOW (ref 39.0–52.0)
Hemoglobin: 16.3 g/dL (ref 13.0–17.0)
Hemoglobin: 7.5 g/dL — CL (ref 13.0–17.0)
Potassium: 4.3 mEq/L (ref 3.5–5.1)
Potassium: 4.9 mEq/L (ref 3.5–5.1)
Potassium: 5 mEq/L (ref 3.5–5.1)
Sodium: 140 mEq/L (ref 135–145)

## 2010-12-09 LAB — URINALYSIS, ROUTINE W REFLEX MICROSCOPIC
Nitrite: NEGATIVE
Specific Gravity, Urine: 1.027 (ref 1.005–1.030)
Urobilinogen, UA: 0.2 mg/dL (ref 0.0–1.0)
pH: 5 (ref 5.0–8.0)

## 2010-12-09 LAB — TROPONIN I: Troponin I: 0.03 ng/mL (ref 0.00–0.06)

## 2010-12-09 LAB — CROSSMATCH: ABO/RH(D): A POS

## 2010-12-09 LAB — COMPREHENSIVE METABOLIC PANEL
ALT: 137 U/L — ABNORMAL HIGH (ref 0–53)
ALT: 32 U/L (ref 0–53)
AST: 45 U/L — ABNORMAL HIGH (ref 0–37)
AST: 84 U/L — ABNORMAL HIGH (ref 0–37)
Albumin: 2.6 g/dL — ABNORMAL LOW (ref 3.5–5.2)
Alkaline Phosphatase: 73 U/L (ref 39–117)
CO2: 28 mEq/L (ref 19–32)
CO2: 29 mEq/L (ref 19–32)
Calcium: 10.4 mg/dL (ref 8.4–10.5)
Calcium: 7.7 mg/dL — ABNORMAL LOW (ref 8.4–10.5)
Chloride: 104 mEq/L (ref 96–112)
Creatinine, Ser: 1.16 mg/dL (ref 0.4–1.5)
GFR calc Af Amer: >60 mL/min (ref >60)
GFR calc Af Amer: >60 mL/min (ref >60)
GFR calc non Af Amer: >60 mL/min (ref >60)
GFR calc non Af Amer: >60 mL/min (ref >60)
Potassium: 4.2 mEq/L (ref 3.5–5.1)
Sodium: 138 mEq/L (ref 135–145)
Sodium: 140 mEq/L (ref 135–145)

## 2010-12-09 LAB — BLOOD GAS, ARTERIAL
Acid-base deficit: 1.4 mmol/L (ref 0.0–2.0)
Acid-base deficit: 2.3 mmol/L — ABNORMAL HIGH (ref 0.0–2.0)
Bicarbonate: 22 mEq/L (ref 20.0–24.0)
Bicarbonate: 22.5 mEq/L (ref 20.0–24.0)
Bicarbonate: 22.6 mEq/L (ref 20.0–24.0)
Delivery systems: POSITIVE
Drawn by: 290241
Expiratory PAP: 6
FIO2: 0.4 %
FIO2: 0.8 %
Inspiratory PAP: 12
Mode: POSITIVE
O2 Saturation: 91.8 %
O2 Saturation: 96.6 %
Patient temperature: 98.6
Patient temperature: 98.9
TCO2: 23.1 mmol/L (ref 0–100)
TCO2: 23.7 mmol/L (ref 0–100)
TCO2: 23.7 mmol/L (ref 0–100)
pCO2 arterial: 36.9 mmHg (ref 35.0–45.0)
pCO2 arterial: 37.5 mmHg (ref 35.0–45.0)
pCO2 arterial: 37.6 mmHg (ref 35.0–45.0)
pH, Arterial: 7.386 (ref 7.350–7.450)
pH, Arterial: 7.395 (ref 7.350–7.450)
pH, Arterial: 7.405 (ref 7.350–7.450)
pO2, Arterial: 61.5 mmHg — ABNORMAL LOW (ref 80.0–100.0)
pO2, Arterial: 87.4 mmHg (ref 80.0–100.0)

## 2010-12-09 LAB — CK TOTAL AND CKMB (NOT AT ARMC): Total CK: 79 U/L (ref 7–232)

## 2010-12-09 LAB — MAGNESIUM
Magnesium: 2.2 mg/dL (ref 1.5–2.5)
Magnesium: 2.3 mg/dL (ref 1.5–2.5)
Magnesium: 2.8 mg/dL — ABNORMAL HIGH (ref 1.5–2.5)

## 2010-12-09 LAB — POCT I-STAT 3, VENOUS BLOOD GAS (G3P V)
Acid-base deficit: 2 mmol/L (ref 0.0–2.0)
Bicarbonate: 25.1 mEq/L — ABNORMAL HIGH (ref 20.0–24.0)
O2 Saturation: 94 %
pCO2, Ven: 47.9 mmHg (ref 45.0–50.0)
pH, Ven: 7.306 — ABNORMAL HIGH (ref 7.250–7.300)

## 2010-12-09 LAB — CARDIAC PANEL(CRET KIN+CKTOT+MB+TROPI)
CK, MB: 10.5 ng/mL — ABNORMAL HIGH (ref 0.3–4.0)
CK, MB: 14.7 ng/mL — ABNORMAL HIGH (ref 0.3–4.0)
CK, MB: 3.7 ng/mL (ref 0.3–4.0)
Relative Index: 4.6 — ABNORMAL HIGH (ref 0.0–2.5)
Relative Index: 4.8 — ABNORMAL HIGH (ref 0.0–2.5)
Relative Index: INVALID (ref 0.0–2.5)
Relative Index: INVALID (ref 0.0–2.5)
Total CK: 221 U/L (ref 7–232)
Total CK: 323 U/L — ABNORMAL HIGH (ref 7–232)
Total CK: 79 U/L (ref 7–232)
Troponin I: 0.06 ng/mL (ref 0.00–0.06)
Troponin I: 4.29 ng/mL (ref 0.00–0.06)
Troponin I: 5.02 ng/mL (ref 0.00–0.06)

## 2010-12-09 LAB — TYPE AND SCREEN: Antibody Screen: NEGATIVE

## 2010-12-09 LAB — CULTURE, RESPIRATORY W GRAM STAIN

## 2010-12-09 LAB — PROTIME-INR
INR: 1 (ref 0.00–1.49)
INR: 1.66 — ABNORMAL HIGH (ref 0.00–1.49)
INR: 1.67 — ABNORMAL HIGH (ref 0.00–1.49)
INR: 1.79 — ABNORMAL HIGH (ref 0.00–1.49)
INR: 2.15 — ABNORMAL HIGH (ref 0.00–1.49)
Prothrombin Time: 14.4 seconds (ref 11.6–15.2)
Prothrombin Time: 19.6 seconds — ABNORMAL HIGH (ref 11.6–15.2)

## 2010-12-09 LAB — APTT: aPTT: 35 seconds (ref 24–37)

## 2010-12-09 LAB — PREPARE PLATELETS

## 2010-12-09 LAB — PREPARE RBC (CROSSMATCH)

## 2010-12-09 LAB — MRSA PCR SCREENING: MRSA by PCR: NEGATIVE

## 2010-12-09 LAB — PLATELET COUNT: Platelets: 98 10*3/uL — ABNORMAL LOW (ref 150–400)

## 2010-12-09 LAB — CREATININE, SERUM
Creatinine, Ser: 0.92 mg/dL (ref 0.4–1.5)
GFR calc non Af Amer: >60 mL/min (ref >60)
GFR calc non Af Amer: >60 mL/min (ref >60)

## 2010-12-09 LAB — HEMOGLOBIN AND HEMATOCRIT, BLOOD: Hemoglobin: 8.1 g/dL — ABNORMAL LOW (ref 13.0–17.0)

## 2010-12-09 LAB — POCT I-STAT GLUCOSE: Operator id: 190281

## 2010-12-14 LAB — SODIUM, URINE, RANDOM: Sodium, Ur: 88 mEq/L

## 2010-12-14 LAB — BASIC METABOLIC PANEL
BUN: 18 mg/dL (ref 6–23)
Calcium: 8.4 mg/dL (ref 8.4–10.5)
Chloride: 108 mEq/L (ref 96–112)
GFR calc Af Amer: 42 mL/min — ABNORMAL LOW (ref >60)
GFR calc non Af Amer: 34 mL/min — ABNORMAL LOW (ref >60)
Glucose, Bld: 102 mg/dL — ABNORMAL HIGH (ref 70–99)
Glucose, Bld: 122 mg/dL — ABNORMAL HIGH (ref 70–99)
Potassium: 3.7 mEq/L (ref 3.5–5.1)
Potassium: 4 mEq/L (ref 3.5–5.1)
Sodium: 139 mEq/L (ref 135–145)

## 2010-12-14 LAB — CBC
HCT: 34.8 % — ABNORMAL LOW (ref 39.0–52.0)
HCT: 36.2 % — ABNORMAL LOW (ref 39.0–52.0)
Hemoglobin: 12 g/dL — ABNORMAL LOW (ref 13.0–17.0)
Hemoglobin: 12.4 g/dL — ABNORMAL LOW (ref 13.0–17.0)
Hemoglobin: 14.3 g/dL (ref 13.0–17.0)
MCHC: 33.8 g/dL (ref 30.0–36.0)
MCV: 90.8 fL (ref 78.0–100.0)
Platelets: 146 10*3/uL — ABNORMAL LOW (ref 150–400)
RBC: 3.97 MIL/uL — ABNORMAL LOW (ref 4.22–5.81)
RBC: 4.62 MIL/uL (ref 4.22–5.81)
WBC: 6.3 10*3/uL (ref 4.0–10.5)
WBC: 6.5 10*3/uL (ref 4.0–10.5)
WBC: 7.6 10*3/uL (ref 4.0–10.5)

## 2010-12-14 LAB — FOLATE RBC: RBC Folate: 509 ng/mL (ref 180–600)

## 2010-12-14 LAB — URINALYSIS, ROUTINE W REFLEX MICROSCOPIC
Bilirubin Urine: NEGATIVE
Bilirubin Urine: NEGATIVE
Glucose, UA: 100 mg/dL — AB
Hgb urine dipstick: NEGATIVE
Ketones, ur: NEGATIVE mg/dL
Ketones, ur: NEGATIVE mg/dL
Leukocytes, UA: NEGATIVE
Nitrite: NEGATIVE
Protein, ur: 30 mg/dL — AB
Protein, ur: 30 mg/dL — AB
Specific Gravity, Urine: 1.019 (ref 1.005–1.030)
Specific Gravity, Urine: 1.021 (ref 1.005–1.030)
Urobilinogen, UA: 0.2 mg/dL (ref 0.0–1.0)
Urobilinogen, UA: 0.2 mg/dL (ref 0.0–1.0)
pH: 5 (ref 5.0–8.0)

## 2010-12-14 LAB — URINE MICROSCOPIC-ADD ON

## 2010-12-14 LAB — GLUCOSE, CAPILLARY
Glucose-Capillary: 117 mg/dL — ABNORMAL HIGH (ref 70–99)
Glucose-Capillary: 168 mg/dL — ABNORMAL HIGH (ref 70–99)
Glucose-Capillary: 183 mg/dL — ABNORMAL HIGH (ref 70–99)
Glucose-Capillary: 188 mg/dL — ABNORMAL HIGH (ref 70–99)

## 2010-12-14 LAB — COMPREHENSIVE METABOLIC PANEL
ALT: 22 U/L (ref 0–53)
AST: 24 U/L (ref 0–37)
Albumin: 3.9 g/dL (ref 3.5–5.2)
Alkaline Phosphatase: 72 U/L (ref 39–117)
Calcium: 9.8 mg/dL (ref 8.4–10.5)
GFR calc Af Amer: 24 mL/min — ABNORMAL LOW (ref >60)
Glucose, Bld: 180 mg/dL — ABNORMAL HIGH (ref 70–99)
Potassium: 4.4 mEq/L (ref 3.5–5.1)
Sodium: 137 mEq/L (ref 135–145)
Total Protein: 7.9 g/dL (ref 6.0–8.3)

## 2010-12-14 LAB — IRON AND TIBC
Saturation Ratios: 23 % (ref 20–55)
TIBC: 312 ug/dL (ref 215–435)
UIBC: 241 ug/dL

## 2010-12-14 LAB — DIFFERENTIAL
Basophils Relative: 1 % (ref 0–1)
Eosinophils Absolute: 0.2 10*3/uL (ref 0.0–0.7)
Eosinophils Relative: 2 % (ref 0–5)
Lymphs Abs: 1.2 10*3/uL (ref 0.7–4.0)
Monocytes Absolute: 0.5 10*3/uL (ref 0.1–1.0)
Monocytes Relative: 7 % (ref 3–12)

## 2010-12-14 LAB — CK TOTAL AND CKMB (NOT AT ARMC)
CK, MB: 2.8 ng/mL (ref 0.3–4.0)
Total CK: 51 U/L (ref 7–232)

## 2010-12-14 LAB — HEMOGLOBIN A1C: Mean Plasma Glucose: 177 mg/dL

## 2010-12-14 LAB — ANTI-DNA ANTIBODY, DOUBLE-STRANDED: ds DNA Ab: <1 IU/mL (ref <5)

## 2010-12-14 LAB — BRAIN NATRIURETIC PEPTIDE: Pro B Natriuretic peptide (BNP): 68 pg/mL (ref 0.0–100.0)

## 2011-01-17 NOTE — H&P (Signed)
NAME:  Andre Malone, Andre Malone NO.:  [id number]   MEDICAL RECORD NO.:  [id number]          PATIENT TYPE:  INP   LOCATION:  6735                         FACILITY:  [facility]   PHYSICIAN:  Darryl D. Prime, MD    DATE OF BIRTH:  [birth year]   DATE OF ADMISSION:  12/17/2008  DATE OF DISCHARGE:                              HISTORY & PHYSICAL   The patient is a Full Code.   PRIMARY CARE PHYSICIAN:  He sees a PA at [facility], Belva Agee.   CHIEF COMPLAINT:  Worsening kidney function; told by his PA to come to  the emergency room.   HISTORY OF PRESENT ILLNESS:  Andre Malone is a 60 year old male with a  history of a thoracic aortic aneurysm 5 cm on CT of the chest in June  2008.  Transverse and descending thoracic aorta was normal in caliber.  History of depression.  History of hyperlipidemia.  History of  hypertension.  History of restless leg syndrome. History of  gastroesophageal reflux disease.  History of moderate aortic stenosis,  with preserved LV function.  His last echocardiogram was in May 2008.  Aortic valve area by V-max was 0.94 and by VTI was 1.02; the mean  gradient was 21.7.  He had mild increase in left ventricular wall  thickness.  He has a history of coronary artery disease, status post PCI  to the right coronary.  His last catheterization was at this institution  in June 2008, showing a 70% diagonal lesion; otherwise nonobstructive  coronary artery disease and managed medically.  History of diabetes for  15 years, on insulin.  He presents with the above problem.   The patient notes on regular checkup yesterday he had blood work  performed, and apparently his creatinine was elevated significantly  compared to his baseline.  He does have a history of chronic kidney  disease, GFR was in the range of 60s-80s in May 2008; and GFR dropped  off around July 01, 2008, when his creatinine bumped to 1.88 and GFR  then was in the range of  37.   On June 11, 2008 he did have a right ureteral stone and underwent  cystoscopy and removal of that stone then.  He did not have, at least at  this institution, of contrasted imaging study at that time.   The patient in the emergency room here his creatinine is 3.26.  His  creatinine range of  giving him a GFR in a range of about 20.  The  patient denies any recent fever or upper respiratory tract symptoms.  He  denies any recent weight gain or decrease in appetite.  He denies any  dyspnea.  He does note occasional sharp chest pain, with radiation to  the back that has been significant; but not significant enough to  present to the emergency room.  He notes recent progressive dyspnea on  exertion.  The patient is on Diovan.  He has been on an ACE inhibitor in  the past, but it caused a cough.  He has no paroxysmal nocturnal dyspnea  or  orthopnea.  No lower extremity edema.   PAST MEDICAL AND SURGICAL HISTORY:  Is as above.  1. History of syncope.  2. History of obesity.  3. History of hypothyroidism.  4. History of tonsillectomy and adenoidectomy.  5. History of urinary tract infection at the time of his renal stone      in October 2099.  6. History of fatty liver not otherwise specified.  7. History of chronic kidney disease, as above.  8. History of diabetes as above.  Hemoglobin A1c was 8.2 in May 2008.  9. History of post traumatic stress disorder.  10.History of right eye blindness.  11.History of right elbow surgery.  12.History of aortic stenosis as above.  13.He has a history of chronic headaches.   ALLERGIES:  BENADRYL, ACE INHIBITORS (which cause a cough and cause GI  distress).   MEDICATIONS:  1. Alprazolam 0.5 mg tablet, a half tablet to one tablet twice a day.  2. Lyrica 50-100 mg three times a day.  3. Suboxone 8 mg/2 mg tablet. 1/4 pill once or twice a day as needed.  4. Janumet 50/1000 mg one pill twice a day.  5. Cymbalta 30 mg pill every morning.  6.  Lantus insulin 50 units subcutaneously every morning.  7. Lovastatin 40 mg every evening.  8. Synthroid 75 mcg daily.  9. Diovan HCT 160/12.5 mg half a pill twice a day; hold if blood      pressure low.   SOCIAL HISTORY:  No current tobacco, alcohol or drug use.  He lives in  [place] with his wife.   FAMILY HISTORY:  Father passed away at 22; the patient's father did have  coronary disease status post bypass surgery in his 58s.   REVIEW OF SYSTEMS:  A 14-point review of systems was negative unless  stated above.   PHYSICAL EXAMINATION:  VITAL SIGNS:  Temperature 97.8, blood pressure  139/94, pulse 86, respiratory rate 14, saturations 97% on room air.  GENERAL:  The patient is a male who looks his stated age; in no acute  distress.  HEENT:  Normocephalic, atraumatic.  Pupils are equal, round and reactive  to light; with extraocular movements being intact.  The oropharynx is  dry.  NECK:  Supple with no lymphadenopathy or thyromegaly.  No carotid  bruits.  He may have aortic stenosis that radiates to the carotid.  CARDIOVASCULAR EXAM:  Regular rhythm and rate.  He does have a diamond-  shaped systolic murmur that is in the range of 3/6 with a good S2.  He  is not having late or delayed pulse.  LUNGS:  Clear to auscultation bilaterally.  ABDOMEN:  Obese, soft, nontender, nondistended; with no  hepatosplenomegaly.  EXTREMITIES:  Show no clubbing, cyanosis or edema.  NEUROLOGIC EXAM:  He is alert and oriented x4.  Cranial nerves II-XII  are grossly intact.  Strength and sensation are grossly intact.   LABORATORY DATA:  Shows a urinalysis of 0-2 platelets and protein 30,  otherwise negative.  His glucose was 100 on the UA.  pH was 5.  Cardiac  markers are 15 and 39 and were negative.  Sodium 137, with potassium  4.4, chloride 103, bicarb 24, BUN 36, creatinine 3.26, glucose 180.  Otherwise normal LFTs.  His white count was 7.6 with hemoglobin of 14.3,  hematocrit 42.4 and his  platelets of 182, segs of 75%.  Chest x-ray  showed no acute cardiopulmonary disease; no change from chest x-ray of  October 2009.  EKG is pending.   ASSESSMENT/PLAN:  This is a patient with:  history of longstanding  diabetes, history of moderate aortic restenosis, history of coronary  arty disease status post percutaneous coronary intervention,  history of  a thoracic aortic aneurysm in the range of 5 cm in the ascending aorta;  now with worsening kidney function, most likely due to obstructive  nephropathy.  He may have some element of hypovolemia that may be  exacerbating this.  At this time he will be admitted for workup of this.  If his creatinine will still allow, this can be done as an outpatient.  But, we will get Nephrology to see him.  We will check a FENA and  ultrasound of his renal system.  We will get an echocardiogram to rule  out progressive aortic stenosis and low cardiac output kidney disease.  We will gently hydrate.  We will hold Diovan.  We will follow his  electrolytes.  We will place a Foley and bladder scan him prior to doing  this to rule out obstructive nephropathy.  We will get an ANA, double-  stranded DNA.  We will also get iron studies.  His hemoglobin does  appear to within normal limits.  Will check a TSH, B-type natriuretic  peptide.  We will get an EKG.  DVT and GI prophylaxis will be ordered.  I will continue his other medications that he takes at home.      Darryl D. Prime, MD  Electronically Signed     DDP/MEDQ  D:  12/17/2008  T:  12/17/2008  Job:  [id number]

## 2011-01-17 NOTE — Discharge Summary (Signed)
NAME:  Andre Malone, BORDONARO NO.:  [id number]   MEDICAL RECORD NO.:  [id number]          PATIENT TYPE:  INP   LOCATION:  6735                         FACILITY:  [facility]   PHYSICIAN:  Peggye Pitt, M.D. DATE OF BIRTH:  May 21, 1951   DATE OF ADMISSION:  12/17/2008  DATE OF DISCHARGE:  12/20/2008                               DISCHARGE SUMMARY   PRIMARY CARE PHYSICIAN:  Belva Agee at [facility].   DISCHARGE DIAGNOSES:  1. Acute on chronic renal insufficiency with a baseline creatinine of      1.88.  Creatinine upon discharge 1.53.  2. Hypothyroidism.  3. Type 2 diabetes mellitus.  4. Hyperlipidemia.  5. History of coronary artery disease.  6. History of a 5 cm thoracic aortic aneurysm.   DISCHARGE MEDICATIONS:  1. Lantus 10 units subcu daily.  2. Janumet 500/1000 mg twice daily.  3. Alprazolam 0.5 mg a half to one tablet twice daily.  4. Lyrica 500 mg 3 times a day.  5. Suboxone 2 mg under the tongue once or three times a day.  6. Cymbalta 30 mg every morning.  7. Lovastatin 40 mg daily.  8. Levothyroxine 75 mcg daily.  9. Please note that he has been told to hold his Diovan/HCTZ at this      time.   DISPOSITION AND FOLLOWUP:  [name] is discharged home in stable  condition.  Given that this is the weekend and I cannot schedule any  followup appointments for him, I have advised him to call both his  primary care physician to follow up on his creatinine and blood pressure  and also his cardiologist to perform his regular annual screening for  his thoracic aortic aneurysm.   CONSULTATIONS:  In this hospitalization, none.   IMAGES AND PROCEDURES:  Performed during this hospitalization include;  1. A chest x-ray on December 17, 2008, that showed no active      cardiopulmonary disease.  2. A renal ultrasound on December 18, 2008, that showed a small      nonobstructing right renal stone.  No hydronephrosis.  No acute      findings.   HISTORY AND PHYSICAL:  For full details, please refer to dictation by  Dr. Oralia Rud on December 17, 2008; but in brief, [name] is a very  pleasant 60 year old Caucasian man with a history of hypertension,  hyperlipidemia, a thoracic aortic aneurysm, as well as a chronic renal  insufficiency presumed secondary to his diabetes and hypertension with a  baseline creatinine around 1.8.  He had some blood work done at his  primary care physician's office and was called to return to the hospital  because of creatinine of 3.26, which was markedly above his baseline.   HOSPITAL COURSE:  1. Acute on chronic renal insufficiency.  Upon admission, he was      started on generous amount of IV fluids as well as a Foley was      placed, a renal ultrasound was obtained.  With hydration and the      Foley, his creatinine dropped to 1.53 on day  of discharge, which is      even better than his baseline creatinine of 1.88.  I suspect that      at this time that his acute renal insufficiency was secondary to a      combination of dehydration and possibly a small obstructing      ureteral stone that he seems to have passed during this admission      giving his urinalysis that showed a large amount of uric acid and      calcium triphosphate crystals and a typical renal colic abdominal      pain that he presented on this admission.  His Foley has been      discontinued over 24 hours prior to his discharge.  I have      instructed him to follow up with his primary care physician early      next week in order for a repeat BMET to be drawn to follow up on      his renal function.  2. Hypertension.  Upon admission, his Diovan and hydrochlorothiazide      were held secondary to his acute renal insufficiency.  Even with      out of these medications, his blood pressure has maintained within      normal limits of 121/82.  Upon hospital followup appointment his      primary care physician, blood pressure will need to be  rechecked to      ensure that it is not increasing and that he does not need an      antihypertensive added to his regimen.  3. Hypothyroidism.  His TSH was within normal limits.  While in the      hospital, he was continued on his home dose of Synthroid.  4. Type 2 diabetes mellitus, which has been well controlled while in      the hospital.  5. Hyperlipidemia.  He was kept on a statin.  6. History of a 5 cm thoracic break aneurysm.  I have instructed that      he follow up with his cardiologist next week, as he is due for his      annual screening scan, and he is nearing the size where surgery      would be warranted.  7. History of chronic medical issues have not been a problem at this      hospitalization.  8. Vital signs upon discharge; blood pressure 121/82, heart rate 53,      respirations 20, O2 sats 94% on room air with a temperature of      97.7.  9. Laboratory value on the day of discharge; sodium 40, potassium 3.7,      chloride 108, bicarb 25, BUN 18, creatinine 1.53 with a glucose of      102, WBC 6.5, hemoglobin 12.0, and a platelet count of 146.      Peggye Pitt, M.D.  Electronically Signed     EH/MEDQ  D:  12/20/2008  T:  12/20/2008  Job:  [id number]

## 2011-01-17 NOTE — Assessment & Plan Note (Signed)
[facility]                            CARDIOLOGY OFFICE NOTE   Andre Malone, Andre Malone                     MRN:          [id number]  DATE:01/23/2007                            DOB:          October 03, 1950    PRIMARY CARE PHYSICIAN:  Alfredia Client, M.D., [facility].   HISTORY OF PRESENT ILLNESS:  Andre Malone is a 60 year old gentleman with  an ascending aortic aneurysm, atherosclerotic coronary artery disease  and moderate aortic stenosis.  He presents today for evaluation of  syncope.   In January Andre Malone had an episode of syncope while walking his dog.  He had no premonition, chest pain, dyspnea, palpitations or  lightheadedness.  He broke four teeth in the process.  An echocardiogram  showed no change in his moderate aortic stenosis and an ejection  fraction normal.  Unfortunately, he has since had three further  episodes, most recently last Friday.  The one last Friday occurred after  arising from a seated position.  His wife says his glucose was 112.  She  said he was barely arousable for 30 minutes.  He had no seizure activity  and no incontinence.  He did have some minor abrasion to his head.  With  all the episodes, he has had no prodrome of lightheadedness, chest pain  or dyspnea.   PAST MEDICAL/SURGICAL HISTORY:  1. Atherosclerotic coronary artery disease with bare metal stent      placement in the right coronary artery.  The ejection fraction 60%.  2. Ascending aortic aneurysm measuring 5 cm.  3. Moderate aortic stenosis with peak gradient of 39 and mean gradient      of 22 by recent echocardiogram.  4. Diabetes mellitus.  5. Hypertension.  6. Hypercholesterolemia.  7. Obesity.  8. Restless leg syndrome.  9. Gastroesophageal reflux disease.  10.Depression.  11.History of nephrolithiasis.  12.Hypothyroidism.  13.Status post tonsillectomy and adenoidectomy.  14.Status post right elbow surgery.   ALLERGIES:   Intolerant to NSAIDs because of GI symptoms.  Cough with ACE  inhibitors.   CURRENT MEDICATIONS:  1. Lexapro 10 mg daily.  2. Metformin 500 mg b.i.d.  3. Lovastatin 40 mg daily.  4. Synthroid 50 mcg daily.  5. Diovan 80 mg daily.  6. Nexium 40 mg daily.  7. Multivitamin.  8. Aspirin 81 mg daily.  9. Lantus insulin 40 units daily.  10.Ativan 1 mg q.h.s.   SOCIAL HISTORY:  He does not work.  He lives in [place] with his wife.  No alcohol, tobacco or drug abuse.   FAMILY HISTORY:  Mother died in her 73's of unknown cause.  Father died  at age 57.  He had a coronary artery bypass graft surgery prior to that.  One sibling is alive and well.   REVIEW OF SYSTEMS:  Wears glasses.  Chronic exertional dyspnea,  unchanged of late.  Depression, fairly well controlled.  The review of  systems otherwise negative in detail, except as above.   PHYSICAL EXAMINATION:  GENERAL:  He is generally well-appearing though  overweight.  He is in no obvious distress.  VITAL SIGNS:  Heart rate 59, blood pressure 126/88, weight 225 pounds.  NECK:  He has no jugular venous distention, thyromegaly or  lymphadenopathy.  SKIN:  Exam is normal, with the exception of his tattoos.  MUSCULOSKELETAL:  Normal.  LUNGS:  His respiratory effort is normal.  Lungs are clear to  auscultation.  HEART:  He has a regular rate and rhythm with a 2/6 systolic ejection  murmur at the right upper sternal border.  S2 is preserved.  ABDOMEN:  Soft, non-distended, nontender.  There is no  hepatosplenomegaly.  Bowel sounds are normal.  EXTREMITIES:  Warm without clubbing, cyanosis or edema.  No ulcerations.  PULSES:  Carotid pulses 2+ bilaterally without bruit.  Femoral pulses 2+  bilaterally without bruit.  PT pulses 2+ bilaterally.  NEUROLOGIC:  He is alert and oriented x3 with normal affect and normal  neurological examination.   IMPRESSION/RECOMMENDATIONS:  Recurrent episodes of syncope without clear  cause:  Given that  Dr. Rollene Rotunda has raised the question as to  whether the echocardiograms are under-estimating the aortic stenosis, or  he may have had progression of his coronary artery disease.  I think it  is reasonable to proceed to a heart catheterization for definitive  exclusion of these possibilities.  If this is unrevealing as to the  cause of the syncope, will proceed to implantation of a loop recorder.  The risks and potential benefits of this approach are described in  detail to Andre Malone and to his wife.  They are eager to proceed.  Will  make no changes in his medications at present.     Salvadore Farber, MD  Electronically Signed   WED/MedQ  DD: 01/23/2007  DT: 01/23/2007  Job #: [id number]

## 2011-01-17 NOTE — Assessment & Plan Note (Signed)
OFFICE VISIT   DETRICH, RAKESTRAW  DOB:  [birth year]                                        June 01, 2009  CHART #:  [id number]   The patient return today for follow up to further discuss planned aortic  valve replacement and replacement of an ascending aortic aneurysm.  Please see my initial consultation note from 05/18/2009.  He has a 5-cm  ascending aortic aneurysm and probable bicuspid valve with an aortic  valve area of 0.46-0.58 cm2 with a peak gradient by echo of 53 and a  mean gradient of 34.  The patient was recently hospitalized with  complete heart block and underwent permanent pacemaker placement by Dr.  Graciela Husbands on May 17, 2009.  His catheterization at that time had shown  nonobstructive coronary disease with about 50% LAD stenosis and 50%  diagonal stenosis.  There was about 30-40% mid right coronary stenosis.  It was felt that he required the replacement of his aortic valve and  ascending aortic aneurysm given his symptoms and the severe concentric  left ventricular hypertrophy.  We decided to let him recover from his  permanent pacemaker before proceeding with surgery.  Since  hospitalization, he said he has been feeling weak overall.  He has been  walking short distances without any significant shortness of breath or  chest pain.  He has still had some dizziness which he has had in the  past.   PHYSICAL EXAMINATION:  Vital Signs:  Today, blood pressure is 150/100,  pulse is 96 and regular, respiratory rate is 16 and unlabored.  Oxygen  saturation on room air is 96%.  General:  He looks tired.  Cardiac:  Regular rate and rhythm with a 2/6 systolic murmur of the aorta.  There  is no diastolic murmur.  Lungs:  Clear.  Extremities:  There is no  peripheral edema.   IMPRESSION:  The patient has severe aortic stenosis with probable  bicuspid aortic valve and a 5-cm ascending aneurysm.  This aneurysm  appears to extend from just above  the valve level up to the take off the  innominate artery.  The arch and descending aorta appeared to be of  normal size.  He has nonobstructive coronary disease that I think do not  requires intervention at this time.  I agree that the aortic valve  replacement and replacement of his ascending aorta up to the innominate  artery is the best treatment to prevent progressive left ventricular  deterioration and aortic dissection or rupture.  I discussed the  operative procedure with him and his wife including alternatives,  benefits, and risks including but not limited to bleeding, blood  transfusion, infection, stroke, myocardial infarction, coronary  anastomotic stenosis, dislodgement of one of his permanent pacemaker  leads, or coronary revision, and death.  He understands all this and  would like to proceed with surgery.  We discussed the valve choices  including mechanical and tissue valves.  I recommended a mechanical  valve given his age of 46 years.  I discussed the need for lifelong  anticoagulation with Coumadin and a close follow up needed.  I also  discussed the risk of anticoagulant-related bleeding as well as valve-  related thromboembolism which are probably 1-2% pre year.  He  understands all this and would like to use a mechanical  valve.  We will  plan to do the surgery on Monday, June 07, 2009.   Evelene Croon, M.D.  Electronically Signed   BB/MEDQ  D:  06/01/2009  T:  06/02/2009  Job:  [id number]

## 2011-01-17 NOTE — Assessment & Plan Note (Signed)
[facility]                                 ON-CALL NOTE   [name], [name]                       MRN:          [id number]  DATE:02/04/2007                            DOB:          Aug 07, 1951    His date of birth is 10/27/1950.  He is a patient of Dr.  Melinda Crutch.  [name], [name] wife, called in this evening  because apparently they had not been given any precardiac  catheterization instructions.  He is scheduled for catheterization in  the morning.  I recommended that he remain NPO after midnight and then  offered to answer any additional questions, and they had none.  He is to  report to short stay at 7:30 tomorrow morning.     Nicolasa Ducking, ANP  Electronically Signed    CB/MedQ  DD: 02/04/2007  DT: 02/05/2007  Job #: [id number]

## 2011-01-17 NOTE — H&P (Signed)
NAME:  Andre Malone, MORTENSEN NO.:  [id number]   MEDICAL RECORD NO.:  [id number]          PATIENT TYPE:  INP   LOCATION:  1435                         FACILITY:  [facility]   PHYSICIAN:  Ladell Pier, M.D.   DATE OF BIRTH:  [birth year]   DATE OF ADMISSION:  01/16/2007  DATE OF DISCHARGE:                              HISTORY & PHYSICAL   CHIEF COMPLAINT:  Syncopal episode.   HISTORY OF PRESENT ILLNESS:  Patient is a 60 year old white male that  was brought in by his wife today secondary to syncopal episode times 2.  Per wife, patient had a syncopal episode back in January, then recently  he had another syncopal episode earlier in the day hitting his head with  a small hematoma on the temple area. Then later on this evening, he felt  dizzy when he got up from a seated position. Both her and her son eased  him down to the floor. He then passed out. He did not have any  incontinence, no chest pain, no shortness of breath, did not bite his  tongue. He was a little bit confused after the syncopal episode. Per  patient, they had recently made changes to his narcotic medications and  whenever his medications are adjusted he sometimes gets symptoms. He  recently was also discontinued from Requip for restless leg syndrome.  His wife thinks that he took more of his Oxycodone than he should have  because he confused the 5 mg bottle with the 15 mg bottle and took 3 of  the 15 mg instead of 3 of the 5 mg tablets. He also took some Ativan. He  stated that when he checked his blood pressure after the syncopal  episode it was 80/40, but EMS checked it was 112 systolic. He has a  history of aortic stenosis for which he has seen Dr. Samule Ohm back in  January.   PAST MEDICAL HISTORY:  Significant for:  1. Diabetes.  2. Hypertension.  3. Dyslipidemia.  4. Restless leg syndrome.  5. Obesity.  6. Depression.  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  8. Chronic narcotic use for  restless leg syndrome.   FAMILY HISTORY:  Noncontributory.   SOCIAL HISTORY:  Patient is married. He works as a Technical brewer for  [employer]. He has 1 child. He does not drink or smoke alcohol. He does not  recall an allergy to ACE inhibitors.   REVIEW OF SYSTEMS:  As per stated in the HPI.   PHYSICAL EXAMINATION:  VITAL SIGNS:  Temperature 99.1. Pulse of 91.  Respiration is 20. Blood pressure 103/71.  HEENT:  Normocephalic/atraumatic. Pupils equal round and reactive to  light without erythema.  CARDIOVASCULAR:  Regular rate and rhythm with a 2/6 systolic murmur.  LUNGS:  Clear bilaterally.  ABDOMEN:  Positive bowel sounds.  EXTREMITIES:  Without edema.  NEUROLOGIC:  Nonfocal.   LABORATORY DATA:  WBC 8.9, hemoglobin 12.7, platelet 211, sodium 138,  potassium 4.2, chloride 105, CO2 27, BUN 26, creatinine 1.47, glucose  153, AST 44. Head CT negative. Tylenol level less than 10. UDS positive  for opiates and benzos.   ASSESSMENT/PLAN:  1. Syncopal episode, question of the etiology: This could be secondary      to his moderate aortic stenosis as well as could be secondary to      change in medication and narcotic use. Also, could be secondary to      blood pressure being low from his medications. However, we will      admit him to the hospital and get MRI, MRA. Repeat his 2D echo. Get      carotid Dopplers. Rule him out for cardiac etiology. D-dimer to      rule out for any sort of pulmonary embolism and monitor him on      telemetry.  2. Diabetes: We will continue his home medication, but decrease the      Lantus dose to 30 since he will be eating less in the hospital and      we will cover him with sliding scale insulin.  3. Hypertension:  We will hold his Diovan for systolic blood pressure      less than 100.  4. Chronic pain syndrome/restless leg syndrome:  Continue him on      Oxycodone for moderate pain and Dilaudid for severe pain.      Ladell Pier, M.D.   Electronically Signed     NJ/MEDQ  D:  01/17/2007  T:  01/17/2007  Job:  [id number]

## 2011-01-17 NOTE — Cardiovascular Report (Signed)
NAME:  Andre Malone, Andre Malone NO.:  [id number]   MEDICAL RECORD NO.:  [id number]          PATIENT TYPE:  OIB   LOCATION:  1962                         FACILITY:  [facility]   PHYSICIAN:  Salvadore Farber, MD  DATE OF BIRTH:  04-Mar-1951   DATE OF PROCEDURE:  02/05/2007  DATE OF DISCHARGE:                            CARDIAC CATHETERIZATION   PROCEDURE:  Left and right heart catheterization, coronary angiography,  left ventriculography.   INDICATIONS:  Mr. Scott is a 60 year old gentleman with coronary  disease for which he is status post bare metal stenting of the right  coronary artery, mild to moderate aortic stenosis by echocardiogram and  an aneurysm of the ascending aorta who presents with several episodes of  syncope.  Echocardiogram showed preserved left ventricular systolic  function and suggested only moderate aortic stenosis.  Syncope is thus  unexplained.  He presents for cardiac catheterization to exclude  progression of his coronary disease as the potential contributor as well  as to provide another assessment of his aortic stenosis.   PROCEDURE/TECHNIQUE:  Informed consent was obtained.  Under 1% lidocaine  local anesthesia, a 5-French sheath was placed in the right common  femoral artery and a 7-French sheath in the right common femoral vein  using a Smart needle and the modified Seldinger technique.  Right heart  catheterization was performed using a balloon-tipped catheter.  Cardiac  output was measured using the Fick technique.  Left heart  catheterization was performed using a pigtail catheter which was  introduced after exchanging from an AL-1 catheter.  Coronary angiography  was then performed using JL6 and JL4 catheters.  The patient tolerated  the procedure well and was transferred to the holding room in stable  condition.   COMPLICATIONS:  None.   FINDINGS:  1. HEMODYNAMICS:  RA 14/11/10, RV 32/9/11, PA 31/20/24, PCW 23/21/20,      aorta  173/95/123, LV 178/17/75.  Cardiac output/Index (Fick):      3.6/1.7.  There is no mitral stenosis.  There is moderate aortic      stenosis with a peak gradient of 7, a mean gradient of 12 and a      calculated valve area of 1.2 cm squared.  2. VENTRICULOGRAPHY:  Fluoroscopy demonstrates calcified aortic valve      with preserved motion of at least one leaflet.  The ejection      fraction is 65% without regional wall motion abnormality.  There is      no mitral regurgitation.  The ascending aorta is seen to be dilated      though largely sparing the root.  3. Left main:  Angiographically normal.  4. LAD:  Moderate-sized vessel giving rise to two diagonals.  The LAD      has a 40% stenosis crossing the diagonal.  The first diagonal has a      70% ostial stenosis.  5. Circumflex:  Moderate-sized codominant vessel.  This gives rise to      a large marginal and PDA.  It is angiographically normal.  6. RCA:  Moderate-sized codominant vessel.  There is a previously  placed stent in the mid vessel which has approximately 40% in-stent      restenosis.   IMPRESSION/PLAN:  1. 70% stenosis of the diagonal.  No angina.  Will manage      conservatively.  2. Moderate aortic stenosis, not severe enough to account for his      syncope.  3. Preserved left ventricular systolic function.  4. Low cardiac index.   There is no clear etiology for his syncope.  We will place loop recorder  to assess for arrhythmic cause.           ______________________________  Salvadore Farber, MD     WED/MEDQ  D:  02/05/2007  T:  02/05/2007  Job:  [id number]

## 2011-01-17 NOTE — Assessment & Plan Note (Signed)
OFFICE VISIT   Andre Malone, Andre Malone  DOB:  1950-11-25                                        July 05, 2009  CHART #:  [id number]   HISTORY:  The patient is a 60 year old gentleman who was recently  hospitalized and underwent replacement of the aortic valve and ascending  aortic aneurysm using a 21-mm St. Jude mechanical valve conduit and  coronary reconstruction on June 07, 2009, by Dr. Laneta Malone.  This was for  a bicuspid aortic valve and severe aortic stenosis as well as ascending  aortic aneurysm.  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  Currently, he reports that  he is overall making some fairly steady progress.  He still has some  discomfort primarily musculoskeletal.  He has no significant shortness  of breath.  He is not requiring any pain medicine to this time.  He is  gradually increasing his ambulation and activity level.  He denies  fevers, chills, or other constitutional symptoms.   DIAGNOSTIC TESTS:  Chest x-ray was obtained on June 29, 2009.  It  showed no active disease.  There are no significant effusions,  infiltrates, or evidence of congestive failure.   PHYSICAL EXAMINATION:  Vital Signs:  Blood pressure 123/82, pulse 88,  respirations 18, oxygen saturation is 92% on room air.  General:  This  is a well-developed, obese male in no acute distress.  Pulmonary:  Clear  breath sounds throughout.  Cardiac:  Regular rate and rhythm with  occasional ectopic beat.  There is a crisp aortic valve click.  There is  a soft flow murmur.  Abdomen:  Soft, nontender, obese.  Extremities:  No  edema.  Incisions are healing well without evidence of infection.   ASSESSMENT:  The patient is making adequate ongoing recovery from his  surgery.  I discussed driving with him.  I also discussed increasing  activities and ambulation.  He  appears to understand his current  restrictions quite well.  He is to continue to follow his Coumadin at  the  [facility] as well as ongoing cardiology care by Dr. Clifton Malone.  We  will see him again on a p.r.n. basis for any surgical issues that may  develop.   Andre Malone, P.A.-C.   Andre Malone  D:  07/05/2009  T:  07/06/2009  Job:  [id number]   cc:   Andre Carrow, MD

## 2011-01-17 NOTE — Consult Note (Signed)
NAME:  Andre Malone, Andre Malone NO.:  [id number]   MEDICAL RECORD NO.:  [id number]          PATIENT TYPE:  INP   LOCATION:  1435                         [facility]ACILITY:  [facility]   PHYSICIAN:  Rollene Rotunda, MD, FACCDATE OF BIRTH:  1950/10/19   DATE OF CONSULTATION:  01/18/2007  DATE OF DISCHARGE:  01/18/2007                                 CONSULTATION   DATE OF CONSULTATION:  Jan 18, 2007.   PRIMARY CARDIOLOGIST:  Dr. Samule Ohm and will transition to Dr. Antoine Poche.   PRIMARY CARDIOLOGIST:  Dr. Morrie Sheldon at [facility].   CHIEF COMPLAINT:  Syncope.   HISTORY OF PRESENT ILLNESS:  Andre Malone is a 60 year old male with  previous history of coronary artery disease and syncope.  He had a  syncopal episode in June of 2008 and saw Dr. Samule Ohm.  At that time, he  had an echocardiogram and a CT angiogram of the chest.  His aortic  aneurysm was stable and his aortic valve was felt not to be the cause of  that episode of syncope.  The patient states no other evaluation was  ever done.   On the day of admission, Andre Malone was in his usual state of health.  He states he was working in the garden, digging and pulling weeds and  woke up on the ground.  He does not know how long he was out.  He had no  prodrome or aura, and when he woke up he stated he was not groggy or  confused but was immediately aware of self.  He had an abrasion to his  head and some minor injuries but there was no incontinence.  There was  no flushed feeling and there were no other somatic symptoms.   Andre Malone was fairly active throughout the rest of the day and in the  evening he was sitting on the sofa eating dinner and, when he got up,  his wife stated he developed a funny look on his face and when she  called his name he did not answer.  He took a couple of steps but  appeared very weak.  She and her son were going to help him to the  bedroom but he became too weak for them to hold up and they  lowered him  to the floor.  He was placed on his side.  She stated she had to  stimulate him because he did not seem to be breathing very well.  EMS  was called.  [name] remembers waking up on the EMS stretcher.  Once  again, he had no prodrome and he states that when he woke up he was not  confused or dizzy and had no other somatic symptoms.  As he was lowered  to the floor, there were no injuries.  In the hospital, he has had some  sinus bradycardia, briefly dropping into the 40s but not sustained less  than 50.  Cardiology was asked to evaluate him.  He is currently  asymptomatic.   PAST MEDICAL HISTORY:  1. Status post cardiac catheterization in September of 2006 with  LAD      30%, D1 40%, circumflex no critical disease and a 95% RCA treated      with a bare metal stent reducing the stenosis to 0.  EF at that      time 70%.  2. Hypertension.  3  Hyperlipidemia.  1. Obesity.  2. Diabetes.  3. Thoracic aortic aneurysm measured at 4.4 cm by CT angiogram in      January of 2008.  4. Aortic stenosis with an echocardiogram this admission showing a      mean AV gradient of 21.7 and a peak AV gradient of 39.  AV area by      VPI was 1.02 with an AV area index by VPI 0.48.  5. Restless leg syndrome.  6. Gastroesophageal reflux disease.  7. Depression.  8. Nephrolithiasis.  9. Hypothyroidism.   SURGICAL HISTORY:  He is status post cardiac catheterization as well as  tonsillectomy and adenoidectomy and right elbow surgery.   ALLERGIES:  He is intolerant to NSAIDS because of GI symptoms and he has  a cough with ACE INHIBITORS.   CURRENT MEDICATIONS:  1. Aspirin 81 mg q.d.  2. Lovenox 40 mg q.d.  3. Lexapro 10 mg q.d.  4. Sliding scale insulin.  5. Lantus 30 units q.d.  6. Avapro 150 mg q.d.  7. Synthroid 50 mcg q.d.  8. Glucophage 500 mg b.i.d.  9. Protonix 40 mg q.d.  10.Zocor 20 mg q.d.   SOCIAL HISTORY:  Lives in [place] with his wife and is currently  unemployed.   He has no history of alcohol, tobacco or drug abuse.   FAMILY HISTORY:  His mother died in her 60s with no history of coronary  artery disease and his father died at age 22 with a history of bypass  surgery and CHF.  He has one sibling with no history of coronary artery  disease.   REVIEW OF SYSTEMS:  He wears glasses.  He had some chronic dyspnea on  exertion but this has not recently changed.  He does not experience this  in the course of daily activities.  He denies any palpitations.  Other  than the documented syncopal episodes, he has no history of syncope or  presyncope.  He has had no recent illnesses.  He is taking the Lexapro  and controlling his depression fairly well but, because of the death of  a colleague, he has had some increased stressors recently.  He has  chronic arthralgias and pain issues.  His reflux symptoms are fairly  well-controlled by the Nexium he is taking prior to admission.  Review  of systems is otherwise negative.   PHYSICAL EXAMINATION:  VITAL SIGNS:  Temperature 98.2, blood pressure  113/81, pulse 63, respiratory rate 20, O2 saturation 95% on room air.  Orthostatic vital signs were negative.  GENERAL:  He is a well-developed obese white male in no acute distress.  HEENT:  Normal.  NECK:  There is no lymphadenopathy, thyromegaly or JVD noted.  He has  decreased carotid pulses and there is radiation of his murmur to his  left carotid.  CVA:  His heart is regular in rate and rhythm with S1-S2 and a systolic  murmur is noted typical for AS at the left upper sternal border.  There  is an S4 present and there is mild peaking of the murmur.  His distal  pulses are 2+ in all four extremities and no femoral bruits are  appreciated.  LUNGS:  Essentially clear to auscultation bilaterally.  SKIN:  No rashes or lesions are noted.  ABDOMEN:  Soft and nontender with active bowel sounds.  EXTREMITIES:  There is no cyanosis, clubbing or edema  noted. MUSCULOSKELETAL:  There is no joint deformity or effusion.  No spine or  CVA tenderness.  NEURO:  He is alert and oriented.  Cranial nerves 2-12 are grossly  intact.   EKG is sinus rhythm, rate 82 with no acute ischemic changes and no old  is available for comparison.   LABORATORY DATA:  Hemoglobin 12.3, hematocrit 36.7, WBC 6.1, platelets  162.  Sodium 138, potassium 4.4, chloride 107, CO2 27, BUN 13,  creatinine 1.01, glucose 126.  Troponin I 0.01 x3.  CK-MB 336/12.0 then  232/7.7 then 143/5.2.   IMPRESSION:  1. Syncope.  The etiology is questionable.  Orthostasis is unlikely.      The possibilities include aortic stenosis versus brady arrhythmia      with CAD as a complicating factor.  His CK MBs are elevated but the      troponins are within normal limits and he has no ischemic symptoms.      His syncope was not consistent with an orthostatic etiology.  This      is the third episode.  He does have moderately severe AS but in      general has no symptoms related to this.  He will be followed by      cardiology with consideration given to a cath versus loop recorder      but this can be scheduled as an outpatient.  Mr. Remmert      understands that he is not to drive.  He understands that at this      point in time risk-taking behavior would include stairs and ladders      and significant exertion.  He agrees to limit his activity as      directed and will follow up with cardiology as an outpatient.  The      patient is otherwise to follow up with his primary care physician.      Theodore Demark, PA-C      Rollene Rotunda, MD, [facility]  Electronically Signed    RB/MEDQ  D:  01/18/2007  T:  01/18/2007  Job:  [id number]

## 2011-01-17 NOTE — Discharge Summary (Signed)
NAME:  SHOJI, PERTUIT NO.:  [id number]   MEDICAL RECORD NO.:  [id number]          PATIENT TYPE:  INP   LOCATION:  1435                         FACILITY:  [facility]   PHYSICIAN:  Elliot Cousin, M.D.    DATE OF BIRTH:  [birth year]   DATE OF ADMISSION:  01/16/2007  DATE OF DISCHARGE:  01/18/2007                               DISCHARGE SUMMARY   PRIMARY CARE PHYSICIAN:  Dr. Rudi Heap.   DISCHARGE DIAGNOSES:  Recurrent syncope.   SECONDARY DISCHARGE DIAGNOSES:  1. Type 2 diabetes mellitus.  The patient's hemoglobin A1c during this      hospital course was 8.2.  2. Hypertension.  3. Moderate aortic valve stenosis.  4. Hyperlipidemia.  5. Restless leg syndrome.  6. Obesity.  7. Depression.  8. History of aneurysm of the aorta.  9. Chronic narcotic/opioid use for restless leg syndrome.   DISCHARGE MEDICATIONS:  1. Metformin 500 mg b.i.d.  2. Synthroid 50 mcg daily.  3. Diovan 80 mg daily.  4. Nexium 40 mg daily.  5. Multivitamin 1 tablet daily.  6. Lexapro 10 mg each evening.  7. Lovastatin 40 mg each evening.  8. Aspirin 81 mg daily.  9. Lantus insulin 40 units subcu q.p.m.  10.Oxycodone 5-15 mg every 4-6 hours p.r.n.  11.Ativan 1 mg q.h.s.  12.Ativan 0.5 mg q.h.s. p.r.n.   CONSULTATIONS:  Cardiologist, Rollene Rotunda, MD.   PROCEDURES PERFORMED:  1. MRI of the brain on May15,2008.  The results revealed no      acute abnormality.  2. MRA of the brain.  The results revealed a negative MRA of the head.  3. CT scan of the head on May15,2008.  The results revealed no      evidence of acute intracranial abnormalities and atrophy.  4. A 2-D echocardiogram on [id number].  The results revealed a      normal left ventricular systolic function.  Aortic valve was      moderately to markedly calcified.  Findings consistent with      moderate aortic valve stenosis.  The mean transaortic valve      pressure was 21.7 mmHg.  The aortic valve area was 1.02 cm2.  The     aortic VMAX was 0.94.  5. Carotid Doppler study performed on [id number].  The results      were negative for ICA stenosis bilaterally.   HISTORY OF PRESENT ILLNESS:  The patient is a 60 year old man with a  past medical history significant for aortic valve stenosis, type 2  diabetes mellitus, and chronic opioid use secondary to restless leg  syndrome, who presented to the emergency department on [id number]  after passing out at home.  Per his wife, she believed that the patient  confused the 5-mg bottle of oxycodone with the 15-mg bottle of  oxycodone.  Instead of the patient taking three 5-mg tablets, he  inadvertently took  three 15-mg tablets.  She believes this is the  reason why he passed out.  However, the patient was admitted for further  evaluation and management.  Of note, the patient has had two previous  episodes of syncope.   For additional details, please see the dictated history and physical.   HOSPITAL COURSE:  1. RECURRENT SYNCOPE.  The patient was started on gentle IV fluids.      His chronic medications were continued.  Of note, the patient's      capillary blood sugar was mildly elevated in the emergency      department.  Per the patient, he has had no recent history of      hypoglycemic episodes.  For further evaluation, a MRI/MRA of the      brain, carotid Dopplers, 2-D echocardiogram, cardiac enzymes, and      neuro checks were ordered.  The patient's cardiac enzymes were      completely negative.  A D-dimer was ordered as well and was within      normal limits at 0.41.  His urine drug screen was positive for      opiates and benzodiazepines.  His TSH was within normal limits at      3.49.  His hemoglobin A1c was 8.2.  The results of the MRA and MRI      of the brain revealed no acute abnormalities.  The 2-D      echocardiogram was relatively stable, with regards to his aortic      valve stenosis.  The carotid Doppler study was negative.   Orthostatic vital  signs were also ordered.  Per the results, he was not  orthostatic.  Neuro checks were instituted every 4 hours x24 hours.  Per  the nursing staff, there were no acute neurological abnormalities.   There was a concern regarding an arrhythmia that could be causing the  patient's recurrent syncope, although in this instance, the syncope may  have been medication induced.  However, cardiologist, Dr. Antoine Poche was  consulted for further evaluation and management, specifically to address  whether or not the patient was a candidate for an EP study.  Dr.  Antoine Poche evaluated the patient and agreed that he needed further  evaluation.  For now, the tentative plan is to evaluate the patient  further with a loop recorder.  The patient may or may not need a cardiac  catheterization.  The patient will follow up with Dr. Samule Ohm, following  hospital discharge for further evaluation and instructions.  The patient  was specifically advised not to drive, until further investigational  studies are performed by his cardiologist in outpatient setting.   DISCHARGE LABORATORIES:  Sodium 138, potassium 4.4, chloride 107, CO2  27, glucose 126, BUN 13, creatinine 1.01, CK 143, CK-MB 5.2, troponin-I  0.01, total cholesterol 132, triglycerides 189, HDL 28, LDL 66.      Elliot Cousin, M.D.  Electronically Signed     DF/MEDQ  D:  01/18/2007  T:  01/19/2007  Job:  [id number]   cc:   Salvadore Farber, MD  [address]  [address]  [place]  [place] [address]

## 2011-01-17 NOTE — Op Note (Signed)
NAME:  Andre Malone, Andre Malone NO.:  [id number]   MEDICAL RECORD NO.:  [id number]          PATIENT TYPE:  AMB   LOCATION:  [facility]                         FACILITY:  [facility]   PHYSICIAN:  Courtney Paris, M.D.DATE OF BIRTH:  [birth year]   DATE OF PROCEDURE:  07/01/2008  DATE OF DISCHARGE:                               OPERATIVE REPORT   PREOPERATIVE DIAGNOSIS:  Right ureteral stone.   POSTOPERATIVE DIAGNOSIS:  Right ureteral stone.   OPERATION:  Cysto and removal of right ureteral stone, right retrograde  pyelogram.   ANESTHESIA:  General.   SURGEON:  Courtney Paris, M.D.   BRIEF HISTORY:  This 60 year old patient is admitted with right flank  pain since June 28, 2008.  He has been to the emergency room twice.  He had CT that showed a proximal 5-6 mm stone initially.  Previous  stones were uric acid.  He had ureteroscopy in 1997 as his only  treatment.  He has passed 6 or 7 stones since then, the last was 2  months ago.  He enters now because of persistent pain.   The patient was placed on the operating table in the supine dorsal  lithotomy position after satisfactory induction of general endotracheal  anesthesia.  He was prepped and draped with Betadine in the usual  sterile fashion and given IV Cipro.   Time out was then performed, and the patient and the procedure were then  reconfirmed.  A 21 panendoscope was passed down the anterior urethra.  No strictures were seen.  Posterior urethra was nonobstructing.  The  bladder was entered.  There was seen a stone coming just the right  ureteral orifice which was teased out and then grasped with forceps and  removed intact.  There seemed to be some urine coming behind the stone.  A retrograde was done with open-ended 6 ureteral catheter.  An occlusive  retrograde demonstrated a little bit of fullness down to the UV junction  on the right but no other filling defects or stones.  There was still  some mild  hydronephrosis.  I watched this for quite a while to make sure  that it drained.  There were a few little small fragments that came  after the retrograde and seemed to be draining adequately.  I do not  think there was anything else left in the ureter, so I did not dilate  the ureter past the ureteroscope.   The patient was then taken to the recovery room in good condition.  A  B&O suppository and IV Toradol was given.  He will be later sent home as  an outpatient.      Courtney Paris, M.D.  Electronically Signed    HMK/MEDQ  D:  07/01/2008  T:  07/01/2008  Job:  [id number]

## 2011-01-20 NOTE — H&P (Signed)
NAME:  Andre Malone, Andre Malone NO.:  [id number]   MEDICAL RECORD NO.:  [id number]          PATIENT TYPE:  INP   LOCATION:                               FACILITY:  [facility]   PHYSICIAN:  Salvadore Farber, M.D. LHCDATE OF BIRTH:  1951/02/26   DATE OF ADMISSION:  05/24/2005  DATE OF DISCHARGE:                                HISTORY & PHYSICAL   CHIEF COMPLAINT:  Chest pain.   HISTORY OF PRESENT ILLNESS:  Andre Malone is a 60 year old gentleman with  diabetes mellitus, hypertension, dyslipidemia, and mild aortic stenosis with  probable bicuspid aortic valve, and aneurysm of the ascending aorta.  His  ascending aorta measured 4.9-cm in maximal dimension by CT angiogram  performed in late April 2006.   Andre Malone now presents having suffered five minutes of substernal chest  discomfort radiating to his left arm and the left side of his neck occurring  while doing yard work.  This was promptly relieved with rest one week ago.  In the subsequent days, he has had near daily episodes of similar discomfort  occurring without exertion.  The most recent episode was earlier today.  This occurred at rest.  None of this has been associated with any  diaphoresis, nausea, vomiting.  He did have mild shortness of breath  associated with the first but not subsequent episodes.  The pain has not  radiated to his back.  He has had no discomfort in his right arm or either  leg to suggest impaired perfusion.  He further denies any symptoms  concerning for TIA or stroke.   PAST MEDICAL HISTORY:  1.  Hypertension.  2.  Diabetes mellitus x3 years.  3.  Dyslipidemia.  4.  Restless leg syndrome.  5.  Frequent nephrolithiasis.  6.  Depression.  7.  He has a prior tonsillectomy.  8.  Surgery on his right elbow in 2001.   ALLERGIES:  None known, though ACE INHIBITOR has caused a cough in the past.   CURRENT MEDICATIONS:  1.  Metformin 500 mg twice per day.  2.  Multivitamin.  3.  Prozac 20 mg  twice per day.   SOCIAL HISTORY:  The patient works as a Technical brewer for [employer].  He  works in Education officer, community.  He is married with one child and  enjoys camping, fishing, and the [employer].  He never smoked and denies  alcohol and illicit drug use.   FAMILY HISTORY:  Father died at 90 of heart failure.  Mother is alive and  well.  His sole sibling is a 7 year old brother who is alive and well.   REVIEW OF SYSTEMS:  He wears glasses, occasional nausea.  Otherwise negative  in detail except as above.   PHYSICAL EXAMINATION:  GENERAL:  He is overweight but otherwise well-  appearing in no distress.  VITAL SIGNS:  Heart rate 65, blood pressure 152/80, and weight of 224  pounds.  HEENT:  His dentition is normal.  HEENT is normal.  SKIN:  Without rash.  LUNGS:  Clear to auscultation.  HEART:  He has a nondisplaced  point of maximal cardiac impulse.  There is a  regular rate and rhythm with a 2/6 systolic ejection murmur best heard at  the right upper sternal border but also well heard at the apex.  The second  heart sound is clearly spared.  There is no diastolic murmur and no S3 or  S4.  There is no rub.  ABDOMEN:  Soft, nondistended, and nontender.  There is no  hepatosplenomegaly.  Bowel sounds are normal.  EXTREMITIES:  Warm without clubbing, cyanosis, edema, or ulceration.  The  carotid pulses are 2+ bilaterally without bruits.  Radial pulses are 2+  bilaterally.  Dorsalis pedis pulses are 2+ bilaterally.  Femoral pulses are  2+ bilaterally without bruit.  MUSCULOSKELETAL:  Normal.  PSYCHOLOGICAL:  Alert and oriented x3 with normal affect.  NEUROLOGIC:  Normal.   Electrocardiogram demonstrates a normal sinus rhythm and is a normal EKG.  There is no change compared with one performed on December 13, 2004.   IMPRESSION/RECOMMENDATION:  A 60 year old gentleman with multiple risk  factors for premature atherosclerotic disease as well as an aortic aneurysm  in  the setting of a probable bicuspid aortic valve.  He now presents with  chest discomfort.   DIFFERENTIAL DIAGNOSES:  1.  Unstable coronary syndrome.  2.  Aortic dissection.  3.  Aortic intramural hematoma.  4.  Aortic stenosis: By exam his aortic stenosis remains mild; thus, I think      symptomatic aortic stenosis is much less likely, though certainly      possible.   We will admit him to the hospital for further evaluation.  There, we will  rule him out for myocardial infarction by serial enzymes and  electrocardiograms.  We will proceed to CT angiogram today.  Assuming this  demonstrates no evidence of dissection or aortic intramural hematoma, we  will initiate aspirin and heparin.  We will then proceed to  cardiac catheterization tomorrow.  We will hold Metformin in anticipation of  the cardiac catheterization.  We will check echocardiogram today to exclude  progression of his aortic valvular disease.  During the hospitalization, we  will plan on initiating Losartan for his aneurysmal disease and a statin for  his dyslipidemia.      Salvadore Farber, M.D. [facility]  Electronically Signed     WED/MEDQ  D:  05/24/2005  T:  05/24/2005  Job:  [id number]   cc:   Ernestina Penna, M.D.  [address] [place]  [place] [address]  Fax: [phone]

## 2011-01-20 NOTE — Assessment & Plan Note (Signed)
[facility]                            CARDIOLOGY OFFICE NOTE   Andre Malone, Andre Malone Andre Malone                     MRN:          [id number]  DATE:09/12/2006                            DOB:          [birth year]    PRIMARY CARE PHYSICIAN:  Rudi Heap, M.D.   HISTORY OF PRESENT ILLNESS:  Andre Malone is a 60 year old gentleman with  atherosclerotic coronary disease and mild aortic stenosis as well as an  aneurysm of his ascending aorta.  He has previously had a bare metal  stent placed in his right coronary artery.  Ejection fraction as of  April 2006 was 60%.  He had 4.8-cm aneurysm of ascending thoracic aorta.   Four nights ago, Andre Malone had an episode of abrupt syncope.  He was  out walking his dog and was feeling completely well.  He then awoke  lying on the steps up to his house bleeding from his mouth, having  broken 4 teeth.  He had no chest pain, dyspnea, palpitations, or  lightheadedness prior to the event.  Since the event, he has had none of  these symptoms.  He is scheduled to have 4 teeth pulled under general  anesthesia tomorrow.   CURRENT MEDICATIONS:  1. Zegerid 40 mg per day.  2. Lexapro 10 mg per day.  3. Diovan 12.5 mg per day.  4. Metformin 500 mg twice per day.  5. Lovastatin 40 mg per day.  6. Aspirin 325 mg per day.  7. Cinnamon.   PHYSICAL EXAMINATION:  He is generally well-appearing, in no distress.  HEENT:  Remarkable for multiple broken teeth.  Heart rate 68, blood pressure 115/81.  He is not orthostatic.  He has no  lightheadedness with standing.  He has no jugular venous distention or  thyromegaly.  LUNGS:  Clear to auscultation.  He has a nondisplaced point of maximal cardiac impulse.  There is a  regular rate and rhythm with a 2/6 systolic ejection murmur at the right  upper sternal border without radiation.  ABDOMEN:  Soft, nondistended, nontender.  There is no  hepatosplenomegaly.  Bowel sounds are normal.  There is  no midline  pulsatile mass.  EXTREMITIES:  Warm without clubbing, cyanosis, edema, or ulceration.  Carotid pulses are 2+ bilaterally without bruit.  He is alert and oriented x3 with normal affect.   Electrocardiogram demonstrates normal sinus rhythm.  PR interval was  normal.  QT interval is normal.  There are borderline inferior Q waves  which are new, compared with June 05, 2005.  I say they were  borderline because there is a questionable R wave before the Q wave in  lead 3.   IMPRESSION/RECOMMENDATIONS:  1. Syncope without premonitory symptom in a setting of coronary      disease and aortic stenosis.  Syndrome is clearly concerning for an      arrhythmic etiology.  We will check echocardiogram to assess both      the aortic stenosis and left ventricular systolic function.  Will      arrange for this to be done today.  Will also check  carotid duplex      to assess vertebral flow.  Will then refer him to electrophysiology      for further assessment.  2. Aortic aneurysm:  No symptoms to suggest dissection.  It has now      been 18 months since we last assessed the size.  We will therefore      re-assess with MRA (no contrast).  3. Aortic stenosis:  Check echocardiogram.  4. Diabetes mellitus:  Previously poorly controlled.  The patient      checked his glucose after the episode of syncope and found it to be      in the 150 range.  5. Hypertension.  6. Hypercholesterolemia.     Salvadore Farber, MD  Electronically Signed    WED/MedQ  DD: 09/12/2006  DT: 09/12/2006  Job #: [id number]

## 2011-01-20 NOTE — Discharge Summary (Signed)
NAME:  Andre Malone, Andre Malone NO.:  [id number]   MEDICAL RECORD NO.:  [id number]          PATIENT TYPE:  INP   LOCATION:  6532                         FACILITY:  [facility]   PHYSICIAN:  Salvadore Farber, M.D. LHCDATE OF BIRTH:  1950-11-13   DATE OF ADMISSION:  05/24/2005  DATE OF DISCHARGE:  05/26/2005                                 DISCHARGE SUMMARY   PRINCIPAL DIAGNOSIS:  Unstable angina.   OTHER DIAGNOSES:  1.  Coronary artery disease.  2.  Hypertension.  3.  Hyperlipidemia.  4.  Type 2 diabetes mellitus.  5.  Restless leg syndrome.  6.  History of nephrolithiasis.  7.  Depression.  8.  Mild aortic stenosis and abdominal aortic aneurysm.   ALLERGIES:  ACE INHIBITORS have caused a cough in the past.   PROCEDURE:  Left heart catheterization with PCI and stenting of the right  coronary artery with bare metal stent.   HISTORY OF PRESENT ILLNESS:  A 60 year old white male with prior history of  diabetes, hypertension, hyperlipidemia, and mild aortic stenosis as well as  ascending aortic aneurysm. Mr. Dubie was seen by Dr. Samule Ohm in the  cardiology clinic on May 24, 2005, secondary to an episode of  exertional substernal chest discomfort that occurred approximately one week  prior to admission.  Following that episode he had daily episodes of similar  discomfort occurring without exertion and without associated symptoms.  Decision was made to admit Mr. Blouch for further evaluation.   HOSPITAL COURSE:  Following admission Mr. Potts underwent a CT of his  chest to evaluate his ascending aortic aneurysm which showed a 4.8-cm  ascending thoracic aortic aneurysm that was felt to be stable since prior  study. There was no evidence of aortic dissection. Given this information,  the patient was placed on aspirin as well as heparin. He underwent  transesophageal echocardiogram which revealed an EF of 70% with a mean valve  gradient of 15.2  mmHg and an estimated  valve area of 1.55 cm2.  The  appearance of the aortic valve was marked calcification of the anterior  aspect of the valve. However, the P2 cusps opened well and overall the  gradient was compatible with only mild aortic stenosis. Mr. Grzelak  presented to the cardiac catheterization lab on September 21st where a left  heart cardiac catheterization revealed a 30% lesion in the mid LAD, a 40%  stenosis of the proximal first diagonal, a widely patent left circumflex,  and a 95% lesion in the RCA. The RCA stenosis was successfully treated with  a 3.0 x 15-mm  Multilink Vision bare metal stent. He tolerated this  procedure well and this morning has been ambulating without any recurrent  discomfort or limitations. His cardiac enzymes remained negative post  procedure. He is being discharged home today on satisfactory condition.   It has been noted on this admission that the patient's hemoglobin A1C was  markedly elevated at 12.5.  He is currently on Glucophage 500 mg b.i.d. and  has been tried on higher dose Glucophage in the past with subsequent GI  upset.  I  recommend that he follow up with his chest pain, Dr. Rudi Heap,  in [place] for his further evaluation and adjustment of his diabetes  medication.   DISCHARGE LABORATORY DATA:  Hemoglobin 13.5, hematocrit 39.2, WBC 6.3,  platelet count 153,000, MCV 87.0.  Sodium 139, potassium 3.7, chloride 105,  27, BUN 6, creatinine 0.9, glucose 238. PT 13.8, INR 1.0, PTT 28, total  bilirubin 0.7, alkaline phosphatase 85, AST 48, ALT 33, albumin 3.5. Cardiac  enzymes are negative times four. Total cholesterol 171, triglycerides 319,  HDL 29, LDL 78, calcium 8.5, hemoglobin A1C 12.5.   DISPOSITION:  The patient is being discharged home in good condition.   FOLLOWUP PLAN AND APPOINTMENT:  He is to follow up with Dr. Melinda Crutch PA or  nurse practitioner at [facility] on October 2nd at 11:15 a.m.  He is  to follow up with Dr. Rudi Heap next  week.   DISCHARGE MEDICATIONS:  1.  Aspirin 325 mg daily.  2.  Plavix 75 mg daily.  3.  Cozaar 25 mg daily.  4.  Lipitor 80 mg q.h.s.  5.  Prozac 20 mg b.i.d.  6.  Metformin 500 mg b.i.d., to be resumed on May 27, 2005.  7.  Nitroglycerin 0.4 mg sublingual p.r.n. chest pain  8.  OxyContin 15 mg p.r.n. restless leg.   OUTSTANDING LABS AND STUDIES:  None.   DURATION OF DISCHARGE ENCOUNTER:  45 minutes including physician time.      Ok Anis, NP      Salvadore Farber, M.D. [facility]  Electronically Signed    CRB/MEDQ  D:  05/26/2005  T:  05/26/2005  Job:  [id number]   cc:   Ernestina Penna, M.D.  Fax: [phone]

## 2011-01-20 NOTE — H&P (Signed)
[place]  Patient:    Andre Malone, Andre Malone                     MRN: [id number] Adm. Date:  [id number] Attending:  Rosanne Malone CC:         Andre Malone, M.D.             Andre Malone, M.D.                         History and Physical  DATE OF BIRTH:  Mar 29, 1951  PROBLEM LIST: 1. Atypical chest pain, rule out myocardial infarction.    a. Cardiac risk factors: Hypertension, hypercholesterolemia, obesity,       family medical history for coronary artery disease, sex and age. 2. Uncontrolled hypertension, the patient was started on Norvasc on November 23, 1999. 3. Gastroesophageal reflux disease. 4. Hyperlipidemia. 5. Obesity. 6. Depression. 7. Leg restless syndrome. 8. History of uric acid nephrolithiasis. 9. Intolerance to NSAIDs.  CHIEF COMPLAINT:  Chest pain.  HISTORY OF PRESENT ILLNESS:  Andre Malone is a very pleasant 60 year old white male who presents with atypical chest pain to the emergency department. He woke up this morning with heart burn. He describes chronic heart burn when he eats spicy foods for many years.  While getting ready to go to work, he started having left sided chest pain that he describes as a hard pain and left chest sided chest wall tenderness.  This pain somewhat radiated to the left flank.  He describes how the symptoms got worse with deep inspiration or palpation of the left chest wall region.  Lying down or resting did not make any difference.  He denied any association with nausea, vomiting, shortness of breath, palpitations or diaphoresis.  He denies any previous history of similar symptoms. He denies any  anginal symptoms with exercise.  He denies PND, orthopnea, lower extremity edema. No recent trauma to the lower extremities.  Sublingual nitroglycerin helped this heart pain.  The oxygen saturation was 98% on room air with a respiration rate f 18 to 20.  He denies smoking or  diabetes.  PAST MEDICAL HISTORY:  As problem list.  ALLERGIES:  As problem list.  MEDICATIONS:  Prozac 20 mg p.o. q.d.  Norvasc 5 mg p.o. q.d. for three days. Percocet two tablets q.h.s.  FAMILY MEDICAL HISTORY: Significant for coronary artery disease (his father had a CABG times three at age of 96).  No diabetes. No strokes in the family.  His grandfather died of lung cancer.  He was a heavy smoker. His grandfather also had hypertension.  SOCIAL HISTORY:  The patient is married.  He works as an Research officer, trade union. He denies smoking. He does not drink.  He has five grandchildren.  REVIEW OF SYSTEMS:  As HPI.  No skin rash.  No shortness of breath.  No joint involvement with swelling.   No focal weakness.  No syncope. No swallowing problems. No rhinorrhea. No melena, tarry stools, or bright red blood per rectum.  PHYSICAL EXAMINATION:  VITAL SIGNS:  Afebrile.  Blood pressure 141/100. Heart rate 77. Respirations 18 to 20. Oxygen saturation 97 to 98% on room air.  HEENT:  Normocephalic and atraumatic.  Nonicteric sclerae.  Conjunctivae within the normal limits.  PERRLA. EOMI.  Funduscopic exam negative for papilledema or hemorrhages.  TMs within normal limits.  Moist mucous membranes.  Oropharynx is  clear.  NECK:  Supple.  No JVD. No bruits.  No adenopathy.  No thyromegaly.  LUNGS:  Clear to auscultation bilaterally without crackles or wheezes.  Good air movement bilaterally.  CARDIAC: Regular rate and rhythm without murmurs, rubs, or gallops.  Normal S1 nd S2.  ABDOMEN: Slightly obese, nontender and nondistended.  Bowel sounds were present. No hepatosplenomegaly.  No rebound.  No guarding.  No bruits.  No masses.  GU:  Within normal limits.  RECTAL:  Normal prostatic size. No nodules.  Empty vault.  Normal sphincter tone.  EXTREMITIES: No edema. No clubbing or cyanosis.   Pulses 2+ bilaterally.  No cord sign.  NEUROLOGIC:  Alert and oriented times three.   Strength 5/5 in all extremities.  DTRs 3/5 in all extremities. Cranial nerves II through XII intact. Sensorium intact.  Plantar reflexes downgoing bilaterally.  LABORATORY DATA:  EKG: Unchanged since the last obtained in October 2000. Shows normal sinus rhythm with a ventricular rate of 70.  Normal axis. No ST segment changes. There is a Q wave in 3, unchanged since October.  Slight R wave progression.  T wave inversion in lead 3.  Otherwise negative EKG.  CK 54, MB 1.9. Troponin I 0.06.  Sodium 139, potassium 4.6, chloride 106, CO2 30, BUN 10, creatinine 1.0, glucose 122.  Albumin 4.1, SGOT 27, SGPT 29, alkaline phosphatase 51, total bilirubin 0.7.  Hemoglobin 15.9, MCV 88, WBC 6.8, platelets 231. Chest x-ray is negative.  ASSESSMENT/PLAN: 1. Atypical chest pain, rule out myocardial infarction.  The patients chest    pain symptoms are somewhat atypical for a cardiac source.  Currently, the    electrocardiogram is unchanged since October.  The first set of cardiac    enzymes are negative.  There is no evidence of hypoxemia that could    support pulmonary emboli. A chest x-ray is negative for infiltrate or    pulmonary edema.  We are going to go ahead and admit the patient to Telemetry    to rule out myocardial infarction. Currently, the patient is chest pain free.    Oxygen, aspirin, low dose beta blocker if the heart rate allows and a    nitroglycerin ointment will be given to the patient. Cardiac enzymes and    electrocardiogram series will be obtained.  I spoke with Andre Malone    about this case.  If the patients enzymes and electrocardiograms remain    negative and there is no recurrence of chest pain, the patient will be    discharged home tomorrow morning.  A Cardiolite will be arranged by Andre Malone, nurse practitioner to be performed early this coming week at    Andre Malone office. 2. Uncontrolled hypertension - given the uncontrolled blood pressure, will  go    ahead and increase the Norvasc.  A low dose of beta blocker also could be     used if the blood pressure and heart rate allows it.  Will follow up the    patients blood pressure throughout this hospital stay and adjust this    medication as needed. 3. Gastroesophageal reflux disease - the patient describes clear signs of    dyspepsia and gastroesophageal reflux disease for many years.  In fact,    he woke up with the symptoms this morning.  Atypical chest complaints could    be related to spasms due to gastroesophageal reflux disease.  Will start the    patient on Protonix and follow clinically. 4. Hyperlipidemia -  we discussed with the patients primary care Merikay Lesniewski    his plan prior to this onset of chest pain. Dr. Manus Gunning, at [facility] was planning on using a statin agent to decrease the patients    hyperlipidemia.  Given the concurrent chest symptoms and cardiac workup    will start the patient on Zocor.  The liver function tests are within the    normal limits.  A new lipid profile will be repeated in the morning. DD:  11/25/99 TD:  11/25/99 Job: [id number]

## 2011-01-20 NOTE — Cardiovascular Report (Signed)
NAME:  Andre Malone, Andre Malone NO.:  [id number]   MEDICAL RECORD NO.:  [id number]          PATIENT TYPE:  INP   LOCATION:  6532                         FACILITY:  [facility]   PHYSICIAN:  Salvadore Farber, M.D. LHCDATE OF BIRTH:  1950/10/02   DATE OF PROCEDURE:  05/25/2005  DATE OF DISCHARGE:                              CARDIAC CATHETERIZATION   PROCEDURE:  Coronary angiography, bare metal stent placement in the mid-RCA.   CARDIOLOGIST:  Salvadore Farber, M.D.   INDICATIONS:  [name] is a 60 year old gentleman with diabetes mellitus  and an aneurysm of the ascending aorta who presents with unstable angina.  He underwent CT angiography last night demonstrating no aortic dissection  and stable size of his ascending aortic aneurysm (4.8 cm).  He has ruled out  for myocardial infarction by serial enzymes and electrocardiograms.  He has  been referred for diagnostic angiography and possible percutaneous coronary  angioplasty   PROCEDURAL TECHNIQUE:  Informed consent was obtained. Using 1% lidocaine  local anesthesia, a 6-French sheath was placed in the right femoral artery  using the modified Seldinger technique. Diagnostic angiography was performed  using JL4, JR4 catheters. These demonstrated 95% stenosis of the mid RCA.  Decision was made to proceed to percutaneous revascularization.   Anticoagulation was initiated with bivalirudin.  ACT was confirmed to be  greater than 225 seconds; 600 mg of clopidogrel was administered orally.  A  6-French JR-4 guide was advanced over a wire and engaged in the ostium of  the RCA.  A Prowater wire was advanced to the distal vessel without  difficulty.  The lesion was predilated using a 2.5 x 9 mm Mavik at 6  atmospheres.  It was then stented using a 3.0 x 15 mm Vision stent deployed  at 16 atmospheres. It was then post dilated using a 3.25 x 15 mm POWERSAIL  at 16 atmospheres.  Final angiography demonstrated no residual stenosis  and  TIMI-3 flow to the distal vasculature.  The patient was then transferred to  the holding room in stable condition having tolerated the procedure well.   COMPLICATIONS:  None.   FINDINGS:  1.  Left main coronary angiograph normal.  2.  LAD: Moderate sized vessel giving rise to a single arch diagonal.  There      is a 30% stenosis of the mid-LAD and a 40% stenosis of the proximal      portion of the first diagonal.  3.  Circumflex: A large vessel giving rise to 2 obtuse marginals and a      posterior left ventricular branch.  It has minor luminal irregularities      along its course.  4.  RCA: A moderate-sized, dominant vessel. There is a 95% stenosis of the      mid vessel. This was treated with bare metal stents, no residual.   IMPRESSION AND RECOMMENDATIONS:  Successful percutaneous revascularization  of the mid RCA using a bare metal stent.  Given his acute coronary syndrome  I recommend Plavix for a year.  Aspirin should be continued indefinitely.      Chrissie Noa  Janae Sauce, M.D. [facility]  Electronically Signed     WED/MEDQ  D:  05/25/2005  T:  05/26/2005  Job:  [id number]   cc:   Ernestina Penna, M.D.  Fax: [phone]

## 2011-02-07 ENCOUNTER — Emergency Department (HOSPITAL_COMMUNITY): Payer: Medicare Other

## 2011-02-07 ENCOUNTER — Encounter (HOSPITAL_COMMUNITY): Payer: Self-pay | Admitting: Radiology

## 2011-02-07 ENCOUNTER — Emergency Department (HOSPITAL_COMMUNITY)
Admission: EM | Admit: 2011-02-07 | Discharge: 2011-02-07 | Discharge status: Against Medical Advice | Payer: Medicare Other | Attending: Internal Medicine | Admitting: Internal Medicine

## 2011-02-07 DIAGNOSIS — Z7901 Long term (current) use of anticoagulants: Secondary | ICD-10-CM | POA: Insufficient documentation

## 2011-02-07 DIAGNOSIS — R079 Chest pain, unspecified: Secondary | ICD-10-CM | POA: Insufficient documentation

## 2011-02-07 DIAGNOSIS — G2581 Restless legs syndrome: Secondary | ICD-10-CM | POA: Insufficient documentation

## 2011-02-07 HISTORY — DX: Essential (primary) hypertension: I10

## 2011-02-07 LAB — CBC
Hemoglobin: 15.4 g/dL (ref 13.0–17.0)
MCH: 29.1 pg (ref 26.0–34.0)
MCHC: 33.8 g/dL (ref 30.0–36.0)
MCV: 85.8 fL (ref 78.0–100.0)
Platelets: 189 10*3/uL (ref 150–400)

## 2011-02-07 LAB — BASIC METABOLIC PANEL
CO2: 23 mEq/L (ref 19–32)
Chloride: 98 mEq/L (ref 96–112)
Creatinine, Ser: 1.07 mg/dL (ref 0.4–1.5)
GFR calc Af Amer: >60 mL/min (ref >60)
Potassium: 3.8 mEq/L (ref 3.5–5.1)

## 2011-02-07 LAB — DIFFERENTIAL
Basophils Relative: 0 % (ref 0–1)
Eosinophils Absolute: 0.2 10*3/uL (ref 0.0–0.7)
Lymphs Abs: 1.7 10*3/uL (ref 0.7–4.0)
Monocytes Absolute: 1.2 10*3/uL — ABNORMAL HIGH (ref 0.1–1.0)
Monocytes Relative: 9 % (ref 3–12)

## 2011-02-07 LAB — TROPONIN I: Troponin I: <0.3 ng/mL (ref <0.30)

## 2011-02-07 LAB — CK TOTAL AND CKMB (NOT AT ARMC): Relative Index: INVALID (ref 0.0–2.5)

## 2011-02-09 ENCOUNTER — Encounter: Payer: Self-pay | Admitting: Cardiovascular Disease

## 2011-02-09 ENCOUNTER — Ambulatory Visit (INDEPENDENT_AMBULATORY_CARE_PROVIDER_SITE_OTHER): Payer: Medicare Other | Admitting: Cardiovascular Disease

## 2011-02-09 VITALS — BP 122/70 | HR 76 | Resp 14 | Ht 66.0 in | Wt 222.0 lb

## 2011-02-09 DIAGNOSIS — R079 Chest pain, unspecified: Secondary | ICD-10-CM

## 2011-02-09 DIAGNOSIS — I2510 Atherosclerotic heart disease of native coronary artery without angina pectoris: Secondary | ICD-10-CM

## 2011-02-09 DIAGNOSIS — Z954 Presence of other heart-valve replacement: Secondary | ICD-10-CM

## 2011-02-09 NOTE — Patient Instructions (Signed)
Your physician has requested that you have a lexiscan myoview. For further information please visit [email or web address] Please follow instruction sheet, as given.  Your physician recommends that you schedule a follow-up appointment in: 6 months with Dr. Clifton James

## 2011-02-09 NOTE — Assessment & Plan Note (Signed)
He has moderate disease by cath in January and multiple episodes of chest pain recently.  Will get Lexiscan myoview to exclude ischemia. Continue current therapy.

## 2011-02-09 NOTE — Progress Notes (Signed)
History of Present Illness:60 yo WM with history of severe AS and ascending thoracic aortic aneurysm who is now s/p replacement of the valve and root with a 21 mm St Jude Aortic valve conduit by Dr. Laneta Simmers on October 4th 2010. His postoperative course was complicated by a large pericardial effusion requiring a window on October 7th, 2010 as well as acute on chronic renal failure. 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 He was admitted to [facility] in January 2012 with chest pain. Cardiac cath with non-obstructive disease. He was seen in [facility] ED two days ago with chest pain and he left AMA. He was given NTG and felt somewhat better.   He is here for follow up. His complaint is of restless legs syndrome that bothers him day and night. He rarely sleeps. He has had several episodes of chest pain recently as described above. He is on chronic coumadin therapy for his AVR. He has a permanent pacemaker in place. Overall feels fatigued.     Past Medical History  Diagnosis Date  . Diabetes mellitus   . Hypertension   . CAD (coronary artery disease)     Past Surgical History  Procedure Date  . Aortic valve replacement   . Aortic root replacement   . Right elbow surgery   . Tonsillectomy   . Subxiphoid window     Current Outpatient Prescriptions  Medication Sig Dispense Refill  . Aliskiren-Hydrochlorothiazide (TEKTURNA HCT) 150-25 MG TABS Take by mouth daily.        . Choline Fenofibrate (TRILIPIX) 135 MG capsule Take 135 mg by mouth daily.        . insulin glargine (LANTUS) 100 UNIT/ML injection Inject into the skin as directed.        Marland Kitchen levothyroxine (SYNTHROID, LEVOTHROID) 75 MCG tablet Take 75 mcg by mouth daily.        . metFORMIN (GLUCOPHAGE) 1000 MG tablet Take 1,000 mg by mouth 2 (two) times daily with a meal.        . sertraline (ZOLOFT) 100 MG tablet Take 100 mg by mouth daily.        Marland Kitchen  warfarin (COUMADIN) 5 MG tablet Take 5 mg by mouth as directed.          Allergies  Allergen Reactions  . Ace Inhibitors   . Benadryl (Altaryl)   . Diphenhydramine Hcl   . Morphine And Related   . Nsaids   . Valsartan     REACTION: Kidney Stones    History   Social History  . Marital Status: Married    Spouse Name: N/A    Number of Children: N/A  . Years of Education: N/A   Occupational History  . Not on file.   Social History Main Topics  . Smoking status: Never Smoker   . Smokeless tobacco: Not on file  . Alcohol Use: Not on file  . Drug Use: Not on file  . Sexually Active: Not on file   Other Topics Concern  . Not on file   Social History Narrative  . No narrative on file    No family history on file.  Review of Systems:  As stated in the HPI and otherwise negative.   BP 122/70  Pulse 76  Resp 14  Ht 5\' 6"  (1.676 m)  Wt 222 lb (100.699 kg)  BMI 35.83 kg/m2  Physical Examination: General: Well developed, well nourished,  NAD HEENT: OP clear, mucus membranes moist SKIN: warm, dry. No rashes. Neuro: No focal deficits Musculoskeletal: Muscle strength 5/5 all ext Psychiatric: Mood and affect normal Neck: No JVD, no carotid bruits, no thyromegaly, no lymphadenopathy. Lungs:Clear bilaterally, no wheezes, rhonci, crackles Cardiovascular: Regular rate and rhythm. Crisp valve click. No murmurs, gallops or rubs. Abdomen:Soft. Bowel sounds present. Non-tender.  Extremities: No lower extremity edema. Pulses are 2 + in the bilateral DP/PT.  EKG: 02/07/11: V paced.

## 2011-02-09 NOTE — Assessment & Plan Note (Signed)
Continue coumadin.  

## 2011-02-14 ENCOUNTER — Ambulatory Visit (HOSPITAL_COMMUNITY): Payer: Medicare Other | Attending: Cardiovascular Disease | Admitting: Radiology

## 2011-02-14 DIAGNOSIS — R0789 Other chest pain: Secondary | ICD-10-CM

## 2011-02-14 DIAGNOSIS — R079 Chest pain, unspecified: Secondary | ICD-10-CM | POA: Insufficient documentation

## 2011-02-14 DIAGNOSIS — I2510 Atherosclerotic heart disease of native coronary artery without angina pectoris: Secondary | ICD-10-CM | POA: Insufficient documentation

## 2011-02-14 DIAGNOSIS — R55 Syncope and collapse: Secondary | ICD-10-CM

## 2011-02-14 MED ORDER — TECHNETIUM TC 99M TETROFOSMIN IV KIT
10.6000 | PACK | Freq: Once | INTRAVENOUS | Status: AC | PRN
  Administered 2011-02-14: 11 via INTRAVENOUS

## 2011-02-14 MED ORDER — TECHNETIUM TC 99M TETROFOSMIN IV KIT
33.0000 | PACK | Freq: Once | INTRAVENOUS | Status: AC | PRN
  Administered 2011-02-14: 33 via INTRAVENOUS

## 2011-02-14 MED ORDER — REGADENOSON 0.4 MG/5ML IV SOLN
0.4000 mg | Freq: Once | INTRAVENOUS | Status: AC
  Administered 2011-02-14: 0.4 mg via INTRAVENOUS

## 2011-02-14 NOTE — Progress Notes (Signed)
[facility] SITE 3 NUCLEAR MED [address] [place] [address] [phone]  Cardiology Nuclear Med Study  Andre Malone is a 60 y.o. male [id number] 10/28/1950   Nuclear Med Background Indication for Stress Test:  Evaluation for Ischemia and [facility] 02/08/11 with CP, negative enzymes. History:  4/06 OZH:YQMV apical thinning, EF=61%, 9/06 Stent-RCA, '11 Echo:EF=45%, 09/30/10 Cath:moderate n/o CAD, s/p AVR Cardiac Risk Factors: Hypertension, IDDM Type 2, Lipids and Obesity  Symptoms:  Chest Pressure/Tightness with Exertion (last episode of chest discomfort was last Wednesday), Diaphoresis, Dizziness, Fatigue, Nausea, Palpitations, Rapid HR, Syncope and Vomiting   Nuclear Pre-Procedure Caffeine/Decaff Intake:  None NPO After: 9:00pm   Lungs:  Clear.  O2 sat 94% on RA. IV 0.9% NS with Angio Cath:  20g  IV Site: R Hand  IV Started by:  Cathlyn Parsons, RN  Chest Size (in):  44 Cup Size: n/a  Height: 5\' 6"  (1.676 m)  Weight:  223 lb (101.152 kg)  BMI:  Body mass index is 35.99 kg/(m^2). Tech Comments:  No diabetic meds taken this am.  BS 206    Nuclear Med Study 1 or 2 day study: 1 day  Stress Test Type:  Lexiscan  Reading MD: Willa Rough, MD  Order Authorizing Provider:  Verne Carrow, MD  Resting Radionuclide: Technetium 93m Tetrofosmin  Resting Radionuclide Dose: 10.6 mCi   Stress Radionuclide:  Technetium 49m Tetrofosmin  Stress Radionuclide Dose: 33 mCi           Stress Protocol Rest HR: 61 Stress HR: 114  Rest BP: 131/73 Stress BP: 145/76  Exercise Time (min): 2:00 METS: n/a          Dose of Adenosine (mg):  n/a Dose of Lexiscan: 0.4 mg  Dose of Atropine (mg): n/a Dose of Dobutamine: n/a mcg/kg/min (at max HR)  Stress Test Technologist: Smiley Houseman, CMA-N  Nuclear Technologist:  Domenic Polite, CNMT     Rest Procedure:  Myocardial perfusion imaging was performed at rest 45 minutes following the intravenous administration  of Technetium 43m Tetrofosmin.  Rest ECG: Atrial paced, RBBB.  Stress Procedure:  The patient received IV Lexiscan 0.4 mg over 15-seconds.  Technetium 90m Tetrofosmin injected at 30-seconds.  The patient developed LBBB 1-minute into Lexiscan.  Quantitative spect images were obtained after a 45 minute delay.  Stress ECG:    Patient developed rate related LBBB when walking. This resolved rapidly after stopping. QPS Raw Data Images:  Normal; no motion artifact; normal heart/lung ratio. Stress Images:  Normal homogeneous uptake in all areas of the myocardium. Rest Images:  Normal homogeneous uptake in all areas of the myocardium. Subtraction (SDS):  No evidence of ischemia. Transient Ischemic Dilatation (Normal <1.22):  1.07 Lung/Heart Ratio (Normal <0.45):  .36   Quantitative Gated Spect Images QGS EDV: 108 ml QGS ESV:  48 ml QGS cine images:  Normal Wall Motion QGS EF: 55%  Impression Exercise Capacity:  Lexiscan with low level exercise. BP Response:  Normal blood pressure response. Clinical Symptoms:  Headache ECG Impression:  Rate related LBBB with walking Comparison with Prior Nuclear Study: No images to compare  Overall Impression:  Normal stress nuclear study.   Willa Rough

## 2011-02-15 ENCOUNTER — Ambulatory Visit (INDEPENDENT_AMBULATORY_CARE_PROVIDER_SITE_OTHER): Payer: Medicare Other | Admitting: *Deleted

## 2011-02-15 DIAGNOSIS — I442 Atrioventricular block, complete: Secondary | ICD-10-CM

## 2011-02-15 NOTE — Progress Notes (Signed)
Can we let Andre Malone know that his stress test is normal, no evidence of ischemia. Also, I have no other recommendations for his restless leg syndrome but I will keep looking and asking around. Thanks, chris

## 2011-02-15 NOTE — Progress Notes (Signed)
Pacer check in clinic  

## 2011-02-15 NOTE — Progress Notes (Signed)
Copy to Dr. Clifton James.Scarlette Ar

## 2011-02-17 ENCOUNTER — Telehealth: Payer: Self-pay | Admitting: Cardiovascular Disease

## 2011-02-17 NOTE — Telephone Encounter (Signed)
Pt had test done last week and this week and wants to know results

## 2011-02-17 NOTE — Progress Notes (Signed)
Patient is aware of test/lab results.  

## 2011-02-17 NOTE — Telephone Encounter (Signed)
Patient aware of stress test results.  

## 2011-03-08 ENCOUNTER — Emergency Department (HOSPITAL_COMMUNITY): Payer: Medicare Other

## 2011-03-08 ENCOUNTER — Emergency Department (HOSPITAL_COMMUNITY)
Admission: EM | Admit: 2011-03-08 | Discharge: 2011-03-08 | Disposition: A | Discharge status: Home / Self Care | Payer: Medicare Other | Attending: Emergency Medicine | Admitting: Emergency Medicine

## 2011-03-08 DIAGNOSIS — R109 Unspecified abdominal pain: Secondary | ICD-10-CM | POA: Insufficient documentation

## 2011-03-08 DIAGNOSIS — F329 Major depressive disorder, single episode, unspecified: Secondary | ICD-10-CM | POA: Insufficient documentation

## 2011-03-08 DIAGNOSIS — F3289 Other specified depressive episodes: Secondary | ICD-10-CM | POA: Insufficient documentation

## 2011-03-08 DIAGNOSIS — N289 Disorder of kidney and ureter, unspecified: Secondary | ICD-10-CM | POA: Insufficient documentation

## 2011-03-08 DIAGNOSIS — E039 Hypothyroidism, unspecified: Secondary | ICD-10-CM | POA: Insufficient documentation

## 2011-03-08 DIAGNOSIS — Z9861 Coronary angioplasty status: Secondary | ICD-10-CM | POA: Insufficient documentation

## 2011-03-08 DIAGNOSIS — I10 Essential (primary) hypertension: Secondary | ICD-10-CM | POA: Insufficient documentation

## 2011-03-08 DIAGNOSIS — Z79899 Other long term (current) drug therapy: Secondary | ICD-10-CM | POA: Insufficient documentation

## 2011-03-08 DIAGNOSIS — Z954 Presence of other heart-valve replacement: Secondary | ICD-10-CM | POA: Insufficient documentation

## 2011-03-08 DIAGNOSIS — E119 Type 2 diabetes mellitus without complications: Secondary | ICD-10-CM | POA: Insufficient documentation

## 2011-03-08 DIAGNOSIS — R319 Hematuria, unspecified: Secondary | ICD-10-CM | POA: Insufficient documentation

## 2011-03-08 DIAGNOSIS — G2581 Restless legs syndrome: Secondary | ICD-10-CM | POA: Insufficient documentation

## 2011-03-08 LAB — DIFFERENTIAL
Eosinophils Relative: 3 % (ref 0–5)
Lymphocytes Relative: 27 % (ref 12–46)
Monocytes Absolute: 0.8 10*3/uL (ref 0.1–1.0)
Monocytes Relative: 10 % (ref 3–12)
Neutro Abs: 5.2 10*3/uL (ref 1.7–7.7)

## 2011-03-08 LAB — BASIC METABOLIC PANEL
BUN: 26 mg/dL — ABNORMAL HIGH (ref 6–23)
CO2: 28 mEq/L (ref 19–32)
Calcium: 10.4 mg/dL (ref 8.4–10.5)
Creatinine, Ser: 1.56 mg/dL — ABNORMAL HIGH (ref 0.50–1.35)

## 2011-03-08 LAB — PROTIME-INR
INR: 1.69 — ABNORMAL HIGH (ref 0.00–1.49)
Prothrombin Time: 20.2 seconds — ABNORMAL HIGH (ref 11.6–15.2)

## 2011-03-08 LAB — CBC
HCT: 45.1 % (ref 39.0–52.0)
Hemoglobin: 15.2 g/dL (ref 13.0–17.0)
MCH: 30 pg (ref 26.0–34.0)
MCHC: 33.7 g/dL (ref 30.0–36.0)
MCV: 89 fL (ref 78.0–100.0)
RDW: 15 % (ref 11.5–15.5)

## 2011-03-08 LAB — URINALYSIS, ROUTINE W REFLEX MICROSCOPIC
Bilirubin Urine: NEGATIVE
Glucose, UA: 500 mg/dL — AB
Ketones, ur: NEGATIVE mg/dL
Protein, ur: NEGATIVE mg/dL
pH: 5 (ref 5.0–8.0)

## 2011-03-12 ENCOUNTER — Emergency Department (HOSPITAL_COMMUNITY)
Admission: EM | Admit: 2011-03-12 | Discharge: 2011-03-13 | Disposition: A | Discharge status: Home / Self Care | Payer: Medicare Other | Attending: Emergency Medicine | Admitting: Emergency Medicine

## 2011-03-12 DIAGNOSIS — Z950 Presence of cardiac pacemaker: Secondary | ICD-10-CM | POA: Insufficient documentation

## 2011-03-12 DIAGNOSIS — R079 Chest pain, unspecified: Secondary | ICD-10-CM | POA: Insufficient documentation

## 2011-03-12 DIAGNOSIS — R0602 Shortness of breath: Secondary | ICD-10-CM | POA: Insufficient documentation

## 2011-03-12 DIAGNOSIS — Z7901 Long term (current) use of anticoagulants: Secondary | ICD-10-CM | POA: Insufficient documentation

## 2011-03-12 DIAGNOSIS — R109 Unspecified abdominal pain: Secondary | ICD-10-CM | POA: Insufficient documentation

## 2011-03-12 DIAGNOSIS — E039 Hypothyroidism, unspecified: Secondary | ICD-10-CM | POA: Insufficient documentation

## 2011-03-12 DIAGNOSIS — Z794 Long term (current) use of insulin: Secondary | ICD-10-CM | POA: Insufficient documentation

## 2011-03-12 DIAGNOSIS — E119 Type 2 diabetes mellitus without complications: Secondary | ICD-10-CM | POA: Insufficient documentation

## 2011-03-12 DIAGNOSIS — I10 Essential (primary) hypertension: Secondary | ICD-10-CM | POA: Insufficient documentation

## 2011-03-12 DIAGNOSIS — N200 Calculus of kidney: Secondary | ICD-10-CM | POA: Insufficient documentation

## 2011-03-12 LAB — URINALYSIS, ROUTINE W REFLEX MICROSCOPIC
Bilirubin Urine: NEGATIVE
Glucose, UA: 100 mg/dL — AB
Hgb urine dipstick: NEGATIVE
Ketones, ur: NEGATIVE mg/dL
Leukocytes, UA: NEGATIVE
Protein, ur: NEGATIVE mg/dL

## 2011-03-12 LAB — BASIC METABOLIC PANEL
BUN: 17 mg/dL (ref 6–23)
CO2: 26 mEq/L (ref 19–32)
Calcium: 9.6 mg/dL (ref 8.4–10.5)
Chloride: 100 mEq/L (ref 96–112)
Creatinine, Ser: 1.28 mg/dL (ref 0.50–1.35)
Glucose, Bld: 112 mg/dL — ABNORMAL HIGH (ref 70–99)

## 2011-03-13 ENCOUNTER — Emergency Department (HOSPITAL_COMMUNITY): Payer: Medicare Other

## 2011-03-26 ENCOUNTER — Emergency Department (HOSPITAL_COMMUNITY): Payer: Medicare Other

## 2011-03-26 ENCOUNTER — Encounter (HOSPITAL_COMMUNITY): Payer: Self-pay | Admitting: *Deleted

## 2011-03-26 ENCOUNTER — Emergency Department (HOSPITAL_COMMUNITY)
Admission: EM | Admit: 2011-03-26 | Discharge: 2011-03-26 | Disposition: A | Discharge status: Home / Self Care | Payer: Medicare Other | Attending: Emergency Medicine | Admitting: Emergency Medicine

## 2011-03-26 DIAGNOSIS — I10 Essential (primary) hypertension: Secondary | ICD-10-CM | POA: Insufficient documentation

## 2011-03-26 DIAGNOSIS — Y92009 Unspecified place in unspecified non-institutional (private) residence as the place of occurrence of the external cause: Secondary | ICD-10-CM | POA: Insufficient documentation

## 2011-03-26 DIAGNOSIS — W19XXXA Unspecified fall, initial encounter: Secondary | ICD-10-CM

## 2011-03-26 DIAGNOSIS — S0003XA Contusion of scalp, initial encounter: Secondary | ICD-10-CM | POA: Insufficient documentation

## 2011-03-26 DIAGNOSIS — I2510 Atherosclerotic heart disease of native coronary artery without angina pectoris: Secondary | ICD-10-CM | POA: Insufficient documentation

## 2011-03-26 DIAGNOSIS — S1093XA Contusion of unspecified part of neck, initial encounter: Secondary | ICD-10-CM | POA: Insufficient documentation

## 2011-03-26 DIAGNOSIS — W010XXA Fall on same level from slipping, tripping and stumbling without subsequent striking against object, initial encounter: Secondary | ICD-10-CM | POA: Insufficient documentation

## 2011-03-26 DIAGNOSIS — E119 Type 2 diabetes mellitus without complications: Secondary | ICD-10-CM | POA: Insufficient documentation

## 2011-03-26 DIAGNOSIS — S0990XA Unspecified injury of head, initial encounter: Secondary | ICD-10-CM | POA: Insufficient documentation

## 2011-03-26 DIAGNOSIS — S0083XA Contusion of other part of head, initial encounter: Secondary | ICD-10-CM

## 2011-03-26 LAB — CBC
HCT: 41.5 % (ref 39.0–52.0)
MCH: 30 pg (ref 26.0–34.0)
MCHC: 33.7 g/dL (ref 30.0–36.0)
MCV: 89.1 fL (ref 78.0–100.0)
Platelets: 217 10*3/uL (ref 150–400)
RDW: 14.3 % (ref 11.5–15.5)

## 2011-03-26 MED ORDER — OXYCODONE-ACETAMINOPHEN 5-325 MG PO TABS
1.0000 | ORAL_TABLET | Freq: Once | ORAL | Status: AC
  Administered 2011-03-26: 1 via ORAL

## 2011-03-26 MED ORDER — OXYCODONE-ACETAMINOPHEN 5-325 MG PO TABS
ORAL_TABLET | ORAL | Status: AC
  Administered 2011-03-26: 1 via ORAL
  Filled 2011-03-26: qty 1

## 2011-03-26 MED ORDER — ACETAMINOPHEN 325 MG PO TABS
ORAL_TABLET | ORAL | Status: AC
  Administered 2011-03-26: 650 mg via ORAL
  Filled 2011-03-26: qty 2

## 2011-03-26 MED ORDER — ACETAMINOPHEN 325 MG PO TABS
650.0000 mg | ORAL_TABLET | Freq: Once | ORAL | Status: AC
  Administered 2011-03-26: 650 mg via ORAL
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
## 2011-03-26 NOTE — ED Notes (Signed)
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 Left corrected is 20/50. Both eyes 20/50 corrected.

## 2011-03-30 ENCOUNTER — Emergency Department (HOSPITAL_COMMUNITY): Payer: Medicare Other

## 2011-03-30 ENCOUNTER — Encounter (HOSPITAL_COMMUNITY): Payer: Self-pay

## 2011-03-30 ENCOUNTER — Observation Stay (HOSPITAL_COMMUNITY)
Admission: EM | Admit: 2011-03-30 | Discharge: 2011-03-31 | Disposition: A | Discharge status: Home / Self Care | Payer: Medicare Other | Attending: Internal Medicine | Admitting: Internal Medicine

## 2011-03-30 DIAGNOSIS — T22219A Burn of second degree of unspecified forearm, initial encounter: Secondary | ICD-10-CM | POA: Insufficient documentation

## 2011-03-30 DIAGNOSIS — IMO0001 Reserved for inherently not codable concepts without codable children: Secondary | ICD-10-CM | POA: Insufficient documentation

## 2011-03-30 DIAGNOSIS — M25519 Pain in unspecified shoulder: Secondary | ICD-10-CM | POA: Insufficient documentation

## 2011-03-30 DIAGNOSIS — Z794 Long term (current) use of insulin: Secondary | ICD-10-CM | POA: Insufficient documentation

## 2011-03-30 DIAGNOSIS — T82190A Other mechanical complication of cardiac electrode, initial encounter: Secondary | ICD-10-CM | POA: Insufficient documentation

## 2011-03-30 DIAGNOSIS — Z952 Presence of prosthetic heart valve: Secondary | ICD-10-CM | POA: Insufficient documentation

## 2011-03-30 DIAGNOSIS — T754XXA Electrocution, initial encounter: Principal | ICD-10-CM | POA: Insufficient documentation

## 2011-03-30 DIAGNOSIS — Z79899 Other long term (current) drug therapy: Secondary | ICD-10-CM | POA: Insufficient documentation

## 2011-03-30 DIAGNOSIS — E785 Hyperlipidemia, unspecified: Secondary | ICD-10-CM | POA: Insufficient documentation

## 2011-03-30 DIAGNOSIS — Z7901 Long term (current) use of anticoagulants: Secondary | ICD-10-CM | POA: Insufficient documentation

## 2011-03-30 DIAGNOSIS — Y831 Surgical operation with implant of artificial internal device as the cause of abnormal reaction of the patient, or of later complication, without mention of misadventure at the time of the procedure: Secondary | ICD-10-CM | POA: Insufficient documentation

## 2011-03-30 DIAGNOSIS — R52 Pain, unspecified: Secondary | ICD-10-CM | POA: Insufficient documentation

## 2011-03-30 DIAGNOSIS — M79609 Pain in unspecified limb: Secondary | ICD-10-CM | POA: Insufficient documentation

## 2011-03-30 DIAGNOSIS — R9431 Abnormal electrocardiogram [ECG] [EKG]: Secondary | ICD-10-CM | POA: Insufficient documentation

## 2011-03-30 DIAGNOSIS — W860XXA Exposure to domestic wiring and appliances, initial encounter: Secondary | ICD-10-CM | POA: Insufficient documentation

## 2011-03-30 DIAGNOSIS — H5440 Blindness, one eye, unspecified eye: Secondary | ICD-10-CM | POA: Insufficient documentation

## 2011-03-30 DIAGNOSIS — I2510 Atherosclerotic heart disease of native coronary artery without angina pectoris: Secondary | ICD-10-CM | POA: Insufficient documentation

## 2011-03-30 DIAGNOSIS — R55 Syncope and collapse: Secondary | ICD-10-CM | POA: Insufficient documentation

## 2011-03-30 DIAGNOSIS — M549 Dorsalgia, unspecified: Secondary | ICD-10-CM | POA: Insufficient documentation

## 2011-03-30 DIAGNOSIS — G2581 Restless legs syndrome: Secondary | ICD-10-CM | POA: Insufficient documentation

## 2011-03-30 DIAGNOSIS — M542 Cervicalgia: Secondary | ICD-10-CM | POA: Insufficient documentation

## 2011-03-30 DIAGNOSIS — E039 Hypothyroidism, unspecified: Secondary | ICD-10-CM | POA: Insufficient documentation

## 2011-03-30 DIAGNOSIS — N189 Chronic kidney disease, unspecified: Secondary | ICD-10-CM | POA: Insufficient documentation

## 2011-03-30 DIAGNOSIS — T310 Burns involving less than 10% of body surface: Secondary | ICD-10-CM | POA: Insufficient documentation

## 2011-03-30 LAB — CBC
MCH: 30.6 pg (ref 26.0–34.0)
MCHC: 35.2 g/dL (ref 30.0–36.0)
MCV: 87 fL (ref 78.0–100.0)
MCV: 87.6 fL (ref 78.0–100.0)
Platelets: 219 10*3/uL (ref 150–400)
Platelets: 190 10*3/uL (ref 150–400)
RBC: 4.47 MIL/uL (ref 4.22–5.81)
RBC: 4.92 MIL/uL (ref 4.22–5.81)
WBC: 9.6 10*3/uL (ref 4.0–10.5)

## 2011-03-30 LAB — DIFFERENTIAL
Basophils Relative: 1 % (ref 0–1)
Basophils Relative: 1 % (ref 0–1)
Eosinophils Absolute: 0.2 10*3/uL (ref 0.0–0.7)
Eosinophils Absolute: 0.3 10*3/uL (ref 0.0–0.7)
Lymphs Abs: 2.4 10*3/uL (ref 0.7–4.0)
Lymphs Abs: 2.2 10*3/uL (ref 0.7–4.0)
Monocytes Absolute: 0.5 10*3/uL (ref 0.1–1.0)
Monocytes Relative: 7 % (ref 3–12)
Neutro Abs: 6.1 10*3/uL (ref 1.7–7.7)
Neutrophils Relative %: 64 % (ref 43–77)

## 2011-03-30 LAB — BLOOD GAS, VENOUS
Bicarbonate: 24.9 mEq/L — ABNORMAL HIGH (ref 20.0–24.0)
O2 Saturation: 74 %
Patient temperature: 98.6
TCO2: 22 mmol/L (ref 0–100)
pH, Ven: 7.37 — ABNORMAL HIGH (ref 7.250–7.300)

## 2011-03-30 LAB — POCT I-STAT, CHEM 8
Calcium, Ion: 1.26 mmol/L (ref 1.12–1.32)
Chloride: 105 mEq/L (ref 96–112)
HCT: 46 % (ref 39.0–52.0)
Sodium: 138 mEq/L (ref 135–145)

## 2011-03-30 LAB — RAPID URINE DRUG SCREEN, HOSP PERFORMED
Amphetamines: NOT DETECTED
Barbiturates: NOT DETECTED
Benzodiazepines: POSITIVE — AB

## 2011-03-30 LAB — URINALYSIS, ROUTINE W REFLEX MICROSCOPIC
Bilirubin Urine: NEGATIVE
Glucose, UA: 250 mg/dL — AB
Ketones, ur: NEGATIVE mg/dL
pH: 5 (ref 5.0–8.0)

## 2011-03-30 LAB — CARDIAC PANEL(CRET KIN+CKTOT+MB+TROPI)
CK, MB: 3.1 ng/mL (ref 0.3–4.0)
Total CK: 49 U/L (ref 7–232)
Troponin I: <0.3 ng/mL (ref <0.30)

## 2011-03-30 LAB — APTT: aPTT: 30 seconds (ref 24–37)

## 2011-03-30 LAB — HEMOGLOBIN A1C: Mean Plasma Glucose: 220 mg/dL — ABNORMAL HIGH (ref <117)

## 2011-03-30 LAB — PROTIME-INR: Prothrombin Time: 15.9 seconds — ABNORMAL HIGH (ref 11.6–15.2)

## 2011-03-30 LAB — MAGNESIUM: Magnesium: 1.7 mg/dL (ref 1.5–2.5)

## 2011-03-30 LAB — GLUCOSE, CAPILLARY: Glucose-Capillary: 237 mg/dL — ABNORMAL HIGH (ref 70–99)

## 2011-03-31 DIAGNOSIS — I495 Sick sinus syndrome: Secondary | ICD-10-CM

## 2011-03-31 LAB — CARDIAC PANEL(CRET KIN+CKTOT+MB+TROPI): CK, MB: 3.5 ng/mL (ref 0.3–4.0)

## 2011-03-31 LAB — TSH: TSH: 8.022 u[IU]/mL — ABNORMAL HIGH (ref 0.350–4.500)

## 2011-03-31 LAB — GLUCOSE, CAPILLARY: Glucose-Capillary: 188 mg/dL — ABNORMAL HIGH (ref 70–99)

## 2011-03-31 LAB — PROTIME-INR: Prothrombin Time: 18.2 seconds — ABNORMAL HIGH (ref 11.6–15.2)

## 2011-04-04 ENCOUNTER — Telehealth: Payer: Self-pay | Admitting: Cardiovascular Disease

## 2011-04-04 NOTE — Telephone Encounter (Signed)
Pt just recently discharge from hospital. Patient has a high level of vitamin K in his blood. Pt given shot to take.   C/O pain this am. Would like to be seen today if possible. Pt was given hydro condone  325 mg.  Pt want something stronger to help him sleep.
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
## 2011-04-24 ENCOUNTER — Other Ambulatory Visit: Payer: Self-pay | Admitting: Physician Assistant

## 2011-04-24 NOTE — Telephone Encounter (Signed)
Spoke to the pharmacist at [facility] in [place] and the patient brought in another prescription for the levothyroxine and we can disregard this request we got.

## 2011-04-27 ENCOUNTER — Emergency Department (HOSPITAL_COMMUNITY)
Admission: EM | Admit: 2011-04-27 | Discharge: 2011-04-27 | Disposition: A | Discharge status: Home / Self Care | Payer: Medicare Other | Attending: Emergency Medicine | Admitting: Emergency Medicine

## 2011-04-27 DIAGNOSIS — Z794 Long term (current) use of insulin: Secondary | ICD-10-CM | POA: Insufficient documentation

## 2011-04-27 DIAGNOSIS — R109 Unspecified abdominal pain: Secondary | ICD-10-CM | POA: Insufficient documentation

## 2011-04-27 DIAGNOSIS — I2510 Atherosclerotic heart disease of native coronary artery without angina pectoris: Secondary | ICD-10-CM | POA: Insufficient documentation

## 2011-04-27 DIAGNOSIS — Z950 Presence of cardiac pacemaker: Secondary | ICD-10-CM | POA: Insufficient documentation

## 2011-04-27 DIAGNOSIS — E119 Type 2 diabetes mellitus without complications: Secondary | ICD-10-CM | POA: Insufficient documentation

## 2011-04-27 DIAGNOSIS — Z7901 Long term (current) use of anticoagulants: Secondary | ICD-10-CM | POA: Insufficient documentation

## 2011-04-27 DIAGNOSIS — I10 Essential (primary) hypertension: Secondary | ICD-10-CM | POA: Insufficient documentation

## 2011-04-27 LAB — URINALYSIS, ROUTINE W REFLEX MICROSCOPIC
Glucose, UA: NEGATIVE mg/dL
Ketones, ur: NEGATIVE mg/dL
Leukocytes, UA: NEGATIVE
Specific Gravity, Urine: 1.023 (ref 1.005–1.030)
pH: 5 (ref 5.0–8.0)
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
## 2011-05-13 ENCOUNTER — Emergency Department (HOSPITAL_COMMUNITY): Payer: No Typology Code available for payment source

## 2011-05-13 ENCOUNTER — Emergency Department (HOSPITAL_COMMUNITY)
Admission: EM | Admit: 2011-05-13 | Discharge: 2011-05-14 | Disposition: A | Discharge status: Home / Self Care | Payer: No Typology Code available for payment source | Attending: Emergency Medicine | Admitting: Emergency Medicine

## 2011-05-13 DIAGNOSIS — R51 Headache: Secondary | ICD-10-CM | POA: Insufficient documentation

## 2011-05-13 DIAGNOSIS — E785 Hyperlipidemia, unspecified: Secondary | ICD-10-CM | POA: Insufficient documentation

## 2011-05-13 DIAGNOSIS — Z87442 Personal history of urinary calculi: Secondary | ICD-10-CM | POA: Insufficient documentation

## 2011-05-13 DIAGNOSIS — I2510 Atherosclerotic heart disease of native coronary artery without angina pectoris: Secondary | ICD-10-CM | POA: Insufficient documentation

## 2011-05-13 DIAGNOSIS — E119 Type 2 diabetes mellitus without complications: Secondary | ICD-10-CM | POA: Insufficient documentation

## 2011-05-13 DIAGNOSIS — M542 Cervicalgia: Secondary | ICD-10-CM | POA: Insufficient documentation

## 2011-05-13 DIAGNOSIS — Z7901 Long term (current) use of anticoagulants: Secondary | ICD-10-CM | POA: Insufficient documentation

## 2011-05-13 DIAGNOSIS — I10 Essential (primary) hypertension: Secondary | ICD-10-CM | POA: Insufficient documentation

## 2011-05-13 LAB — PROTIME-INR: Prothrombin Time: 21.1 seconds — ABNORMAL HIGH (ref 11.6–15.2)

## 2011-05-23 ENCOUNTER — Encounter: Payer: Medicare Other | Admitting: Internal Medicine

## 2011-06-06 LAB — URINE MICROSCOPIC-ADD ON

## 2011-06-06 LAB — POCT I-STAT, CHEM 8
Calcium, Ion: 1.05 — ABNORMAL LOW
Chloride: 106
Glucose, Bld: 147 — ABNORMAL HIGH
HCT: 45
Hemoglobin: 15.3
Potassium: 3.7

## 2011-06-06 LAB — BASIC METABOLIC PANEL
BUN: 17
Chloride: 105
Creatinine, Ser: 1.88 — ABNORMAL HIGH
GFR calc Af Amer: 45 — ABNORMAL LOW
GFR calc non Af Amer: 37 — ABNORMAL LOW
Potassium: 4.3

## 2011-06-06 LAB — URINALYSIS, ROUTINE W REFLEX MICROSCOPIC
Glucose, UA: NEGATIVE
Glucose, UA: NEGATIVE
Nitrite: NEGATIVE
Protein, ur: 100 — AB
Specific Gravity, Urine: 1.033 — ABNORMAL HIGH
Urobilinogen, UA: 0.2
pH: 5

## 2011-06-06 LAB — CBC
MCV: 92.7
Platelets: 159
RBC: 4.27
WBC: 10.2

## 2011-06-06 LAB — GLUCOSE, CAPILLARY: Glucose-Capillary: 159 — ABNORMAL HIGH

## 2011-06-16 ENCOUNTER — Other Ambulatory Visit: Payer: Self-pay | Admitting: Orthopaedic Surgery

## 2011-06-16 DIAGNOSIS — M25551 Pain in right hip: Secondary | ICD-10-CM

## 2011-06-19 ENCOUNTER — Ambulatory Visit
Admission: RE | Admit: 2011-06-19 | Discharge: 2011-06-19 | Disposition: A | Discharge status: Home / Self Care | Payer: Medicare Other | Source: Ambulatory Visit | Attending: Orthopaedic Surgery | Admitting: Orthopaedic Surgery

## 2011-06-19 ENCOUNTER — Other Ambulatory Visit: Payer: Medicare Other

## 2011-06-19 DIAGNOSIS — M25551 Pain in right hip: Secondary | ICD-10-CM

## 2011-06-22 LAB — POCT I-STAT 3, VENOUS BLOOD GAS (G3P V)
Acid-Base Excess: 2
Bicarbonate: 27.4 — ABNORMAL HIGH
O2 Saturation: 63
Operator id: 141321
TCO2: 29
pO2, Ven: 34

## 2011-08-23 ENCOUNTER — Telehealth: Payer: Self-pay | Admitting: Cardiovascular Disease

## 2011-08-23 NOTE — Telephone Encounter (Signed)
Pt needs to have an injection and needs to be off coumadin for 3-4 days and needs a letter of approval faxed to them

## 2011-08-23 NOTE — Telephone Encounter (Signed)
Left message to call back  

## 2011-08-24 NOTE — Telephone Encounter (Signed)
Spoke with Steward Drone at Dr. Hoy Register office.  Pt was seen by Dr. Cleophas Dunker yesterday and given depomedrol/xylocaine injection into greater trochanter for pain.  If no improvement in one week Dr. Cleophas Dunker would like to send pt to Dr. Alvester Morin for cortisone injection into hip joint. If injection is needed they are asking if pt can come off coumadin for 3-4 days prior to injection.  Pt on coumadin due to history of AVR.  Will review with Dr. Clifton James

## 2011-08-28 NOTE — Telephone Encounter (Signed)
Agree. cdm 

## 2011-08-28 NOTE — Telephone Encounter (Signed)
I called to give information from Dr. Clifton James to [place]. She is not in office today. I left message that I would fax response from Dr. Clifton James and she should call our office back if she had any questions.  This note faxed to [phone]

## 2011-08-28 NOTE — Telephone Encounter (Signed)
Have tried to fax note twice today but fax number at office is busy.  I called and spoke with Claris Gower and asked her to have Steward Drone contact us when back in office as I am unable to fax note to them.

## 2011-08-28 NOTE — Telephone Encounter (Signed)
He should not hold coumadin for an injection. cdm

## 2011-08-30 NOTE — Telephone Encounter (Signed)
Dr Hoy Register office was notified verbally.

## 2011-10-09 ENCOUNTER — Telehealth: Payer: Self-pay | Admitting: Cardiovascular Disease

## 2011-10-09 NOTE — Telephone Encounter (Signed)
Pt needs to know if he can have a colonoscopy

## 2011-10-09 NOTE — Telephone Encounter (Signed)
Patient states  needs to have a Colonoscopy  Dr. [facility] refuses to do the procedure because he is on Coumadin for MVR. Patient has INR done in [facility].  INR results have been within normal ranges. Patient would like to know if he can be off coumadin to have this procedure. Please call Dr.Benton's GI office   phone # [phone] or fax information to  [phone].   Patient also said has a transmitter for his pacemake which he puts in every  night . He has never got any feedback about it. Patient was seen last in June 7 th 2012.

## 2011-10-10 ENCOUNTER — Ambulatory Visit (INDEPENDENT_AMBULATORY_CARE_PROVIDER_SITE_OTHER): Payer: Medicare Other | Admitting: *Deleted

## 2011-10-10 ENCOUNTER — Telehealth: Payer: Self-pay | Admitting: *Deleted

## 2011-10-10 DIAGNOSIS — I442 Atrioventricular block, complete: Secondary | ICD-10-CM

## 2011-10-10 NOTE — Telephone Encounter (Signed)
Instructions given for Merlin transmissions.  Patient to send later today.

## 2011-10-10 NOTE — Telephone Encounter (Signed)
Spoke with Deanna Artis (Dr. Starr Lake nurse) and gave her information from Dr. Clifton James. Will fax this note to [facility] at [phone]

## 2011-10-10 NOTE — Telephone Encounter (Signed)
He should not stop coumadin for colonscopy. If he needs to be off of coumadin therapy for the colonscopy, he would have to be bridged with Lovenox or heparin as an inpatient when his INR is less than 2.0. His INR is followed in [facility]. Plans could be arranged through [facility] for lovenox or admission for heparin.

## 2011-10-10 NOTE — Telephone Encounter (Signed)
Left message for Dr. Starr Lake nurse to call back.  Will route note to device clinic to address pacemaker transmission question

## 2011-10-13 ENCOUNTER — Encounter: Payer: Self-pay | Admitting: Internal Medicine

## 2011-10-19 NOTE — Progress Notes (Signed)
Remote pacer check  

## 2011-10-24 ENCOUNTER — Encounter: Payer: Self-pay | Admitting: *Deleted

## 2011-12-09 ENCOUNTER — Emergency Department (HOSPITAL_COMMUNITY): Payer: Medicare HMO

## 2011-12-09 ENCOUNTER — Emergency Department (HOSPITAL_COMMUNITY)
Admission: EM | Admit: 2011-12-09 | Discharge: 2011-12-09 | Disposition: A | Discharge status: Home / Self Care | Payer: Medicare HMO | Attending: Emergency Medicine | Admitting: Emergency Medicine

## 2011-12-09 ENCOUNTER — Encounter (HOSPITAL_COMMUNITY): Payer: Self-pay | Admitting: *Deleted

## 2011-12-09 DIAGNOSIS — I10 Essential (primary) hypertension: Secondary | ICD-10-CM | POA: Insufficient documentation

## 2011-12-09 DIAGNOSIS — Z7901 Long term (current) use of anticoagulants: Secondary | ICD-10-CM | POA: Insufficient documentation

## 2011-12-09 DIAGNOSIS — S60221A Contusion of right hand, initial encounter: Secondary | ICD-10-CM

## 2011-12-09 DIAGNOSIS — E119 Type 2 diabetes mellitus without complications: Secondary | ICD-10-CM | POA: Insufficient documentation

## 2011-12-09 DIAGNOSIS — IMO0002 Reserved for concepts with insufficient information to code with codable children: Secondary | ICD-10-CM | POA: Insufficient documentation

## 2011-12-09 DIAGNOSIS — S60229A Contusion of unspecified hand, initial encounter: Secondary | ICD-10-CM | POA: Insufficient documentation

## 2011-12-09 DIAGNOSIS — I2510 Atherosclerotic heart disease of native coronary artery without angina pectoris: Secondary | ICD-10-CM | POA: Insufficient documentation

## 2011-12-09 DIAGNOSIS — Z794 Long term (current) use of insulin: Secondary | ICD-10-CM | POA: Insufficient documentation

## 2011-12-09 DIAGNOSIS — M7989 Other specified soft tissue disorders: Secondary | ICD-10-CM | POA: Insufficient documentation

## 2011-12-09 MED ORDER — OXYCODONE-ACETAMINOPHEN 5-325 MG PO TABS: 1.0000 | ORAL_TABLET | ORAL | Status: AC | PRN

## 2011-12-09 MED ORDER — OXYCODONE-ACETAMINOPHEN 5-325 MG PO TABS
1.0000 | ORAL_TABLET | Freq: Once | ORAL | Status: AC
  Administered 2011-12-09: 1 via ORAL
  Filled 2011-12-09: qty 1

## 2011-12-09 NOTE — ED Notes (Signed)
pt states understanding of discharge instructions

## 2011-12-09 NOTE — Progress Notes (Signed)
Orthopedic Tech Progress Note Patient Details:  Andre Malone 1951/08/17 [id number]  Other Ortho Devices Type of Ortho Device: Other (comment) (arm sling) Ortho Device Location: (R) UE Ortho Device Interventions: Application  Type of Splint: Thumb velcro Splint Location: (R) UE Splint Interventions: Application    Jennye Moccasin 12/09/2011, 10:36 PM
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
## 2011-12-09 NOTE — Discharge Instructions (Signed)
ICE AND ELEVATE TO REDUCE SWELLING. WEAR SPLINT TO KEEP HAND IMMOBILE AND FOLLOW UP WITH DR. Cleophas Dunker IN 2-3 DAYS FOR RECHECK. PERCOCET FOR PAIN AS NEEDED -DO NOT USE PERCOCET WHEN OPERATING HEAVY EQUIPMENT OR DRIVING.   Contusion A contusion is a deep bruise. Contusions happen when an injury causes bleeding under the skin. Signs of bruising include pain, puffiness (swelling), and discolored skin. The contusion may turn blue, purple, or yellow. HOME CARE   Put ice on the injured area.   Put ice in a plastic bag.   Place a towel between your skin and the bag.   Leave the ice on for 15 to 20 minutes, 3 to 4 times a day.   Only take medicine as told by your doctor.   Rest the injured area.   If possible, raise (elevate) the injured area to lessen puffiness.  GET HELP RIGHT AWAY IF:   You have more bruising or puffiness.   You have pain that is getting worse.   Your puffiness or pain is not helped by medicine.  MAKE SURE YOU:   Understand these instructions.   Will watch your condition.   Will get help right away if you are not doing well or get worse.  Document Released: 02/07/2008 Document Revised: 08/10/2011 Document Reviewed: 06/26/2011 [facility] Patient Information 2012 [place], [place].  Cryotherapy Cryotherapy means treatment with cold. Ice or gel packs can be used to reduce both pain and swelling. Ice is the most helpful within the first 24 to 48 hours after an injury or flareup from overusing a muscle or joint. Sprains, strains, spasms, burning pain, shooting pain, and aches can all be eased with ice. Ice can also be used when recovering from surgery. Ice is effective, has very few side effects, and is safe for most people to use. PRECAUTIONS  Ice is not a safe treatment option for people with:  Raynaud's phenomenon. This is a condition affecting small blood vessels in the extremities. Exposure to cold may cause your problems to return.   Cold hypersensitivity. There  are many forms of cold hypersensitivity, including:   Cold urticaria. Red, itchy hives appear on the skin when the tissues begin to warm after being iced.   Cold erythema. This is a red, itchy rash caused by exposure to cold.   Cold hemoglobinuria. Red blood cells break down when the tissues begin to warm after being iced. The hemoglobin that carry oxygen are passed into the urine because they cannot combine with blood proteins fast enough.   Numbness or altered sensitivity in the area being iced.  If you have any of the following conditions, do not use ice until you have discussed cryotherapy with your caregiver:  Heart conditions, such as arrhythmia, angina, or chronic heart disease.   High blood pressure.   Healing wounds or open skin in the area being iced.   Current infections.   Rheumatoid arthritis.   Poor circulation.   Diabetes.  Ice slows the blood flow in the region it is applied. This is beneficial when trying to stop inflamed tissues from spreading irritating chemicals to surrounding tissues. However, if you expose your skin to cold temperatures for too long or without the proper protection, you can damage your skin or nerves. Watch for signs of skin damage due to cold. HOME CARE INSTRUCTIONS Follow these tips to use ice and cold packs safely.  Place a dry or damp towel between the ice and skin. A damp towel will cool the  skin more quickly, so you may need to shorten the time that the ice is used.   For a more rapid response, add gentle compression to the ice.   Ice for no more than 10 to 20 minutes at a time. The bonier the area you are icing, the less time it will take to get the benefits of ice.   Check your skin after 5 minutes to make sure there are no signs of a poor response to cold or skin damage.   Rest 20 minutes or more in between uses.   Once your skin is numb, you can end your treatment. You can test numbness by very lightly touching your skin. The  touch should be so light that you do not see the skin dimple from the pressure of your fingertip. When using ice, most people will feel these normal sensations in this order: cold, burning, aching, and numbness.   Do not use ice on someone who cannot communicate their responses to pain, such as small children or people with dementia.  HOW TO MAKE AN ICE PACK Ice packs are the most common way to use ice therapy. Other methods include ice massage, ice baths, and cryo-sprays. Muscle creams that cause a cold, tingly feeling do not offer the same benefits that ice offers and should not be used as a substitute unless recommended by your caregiver. To make an ice pack, do one of the following:  Place crushed ice or a bag of frozen vegetables in a sealable plastic bag. Squeeze out the excess air. Place this bag inside another plastic bag. Slide the bag into a pillowcase or place a damp towel between your skin and the bag.   Mix 3 parts water with 1 part rubbing alcohol. Freeze the mixture in a sealable plastic bag. When you remove the mixture from the freezer, it will be slushy. Squeeze out the excess air. Place this bag inside another plastic bag. Slide the bag into a pillowcase or place a damp towel between your skin and the bag.  SEEK MEDICAL CARE IF:  You develop white spots on your skin. This may give the skin a blotchy (mottled) appearance.   Your skin turns blue or pale.   Your skin becomes waxy or hard.   Your swelling gets worse.  MAKE SURE YOU:   Understand these instructions.   Will watch your condition.   Will get help right away if you are not doing well or get worse.  Document Released: 04/17/2011 Document Revised: 08/10/2011 Document Reviewed: 04/17/2011 [facility] Patient Information 2012 [place], [place].

## 2011-12-09 NOTE — ED Notes (Signed)
The pt has rt hand pain since he was cutting wood earlier today.  He has pain across his knuckles

## 2011-12-10 NOTE — ED Provider Notes (Signed)
Medical screening examination/treatment/procedure(s) were performed by non-physician practitioner and as supervising physician I was immediately available for consultation/collaboration.  Doug Sou, MD 12/10/11 [phone]

## 2012-01-23 ENCOUNTER — Encounter: Payer: Self-pay | Admitting: Cardiovascular Disease

## 2012-01-23 ENCOUNTER — Ambulatory Visit (INDEPENDENT_AMBULATORY_CARE_PROVIDER_SITE_OTHER): Payer: Medicare HMO | Admitting: Cardiovascular Disease

## 2012-01-23 VITALS — BP 125/72 | HR 69 | Ht 66.0 in | Wt 218.0 lb

## 2012-01-23 DIAGNOSIS — I2510 Atherosclerotic heart disease of native coronary artery without angina pectoris: Secondary | ICD-10-CM

## 2012-01-23 DIAGNOSIS — Z01810 Encounter for preprocedural cardiovascular examination: Secondary | ICD-10-CM

## 2012-01-23 NOTE — Patient Instructions (Signed)
Your physician wants you to follow-up in:  6 months. You will receive a reminder letter in the mail two months in advance. If you don't receive a letter, please call our office to schedule the follow-up appointment.   

## 2012-01-23 NOTE — Assessment & Plan Note (Addendum)
His CAD is stable. No ischemic testing prior to surgical procedure. He had a stress test June 2012 without ischemia. Cardiac cath January 2012 with stable disease. Continue current therapy. He is on coumadin for his aortic valve replacement. He will need to hold coumadin five days before his procedure and will need to arrange for bridging with Lovenox to be used for the three days before surgery. He will call us if he needs [place] to help with this. Will need to contact his Pharmacist at [facility] to discuss. We will be glad to help if needed.  He will need to be anti-coagulated following his surgical procedure.

## 2012-01-23 NOTE — Progress Notes (Signed)
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 Laneta Simmers on October 4th 2010. His postoperative course was complicated by a large pericardial effusion requiring a window on October 7th, 2010 as well as acute on chronic renal failure.  He was admitted to [facility] in January 2012 with chest pain. Cardiac cath with non-obstructive disease January 2012. He was seen in [facility] ED June 2012 with chest pain and he left AMA. I saw him in June 2012 and arranged a stress myoview on 02/14/12 which showed LVEF 55% with no ischemia. He has not been seen in our office since June 2012. He is here today for cardiac evaluation prior to planned left hip replacement surgery with Dr. Cleophas Dunker of [facility]. He is on chronic coumadin therapy for his AVR. His coumadin is followed in his primary care office.   He tells me today that he has been feeling well. No chest pain or SOB. No palpitations, dizziness, near syncope or syncope.     Primary Care Physician: [facility], Dr. Christell Constant.   Last Lipid Profile:  Past Medical History  Diagnosis Date  . Diabetes mellitus   . Hypertension   . CAD (coronary artery disease)   . Thoracic aortic aneurysm     Past Surgical History  Procedure Date  . Aortic valve replacement   . Aortic root replacement   . Right elbow surgery   . Tonsillectomy   . Subxiphoid window     Current Outpatient Prescriptions  Medication Sig Dispense Refill  . fenofibrate micronized (ANTARA) 130 MG capsule Take 130 mg by mouth daily before breakfast.        . folic acid (FOLVITE) 1 MG tablet Take 1 mg by mouth daily.      . insulin glargine (LANTUS) 100 UNIT/ML injection Inject 55 Units into the skin daily.       Boris Lown Oil 300 MG CAPS Take 1  capsule by mouth daily.      Marland Kitchen levothyroxine (SYNTHROID, LEVOTHROID) 88 MCG tablet Take 88 mcg by mouth daily.      . metFORMIN (GLUCOPHAGE) 1000 MG tablet Take 1,000 mg by mouth 2 (two) times daily with a meal.        . Multiple Vitamins-Minerals (ADULT GUMMY) CHEW Chew 1 capsule by mouth 2 (two) times daily. Takes without vitamin K      . olmesartan (BENICAR) 20 MG tablet Take 20 mg by mouth daily.      . sertraline (ZOLOFT) 100 MG tablet Take 100 mg by mouth daily.        . traZODone (DESYREL) 100 MG tablet Take 100 mg by mouth at bedtime.      Marland Kitchen warfarin (COUMADIN) 5 MG tablet Take 5 mg by mouth daily.         Allergies  Allergen Reactions  . Ace Inhibitors Other (See Comments)    cough  . Benadryl (Diphenhydramine Hcl) Other (See Comments)    Makes restless legs worse  . Diphenhydramine Hcl Other (See Comments)    Makes restless legs worse  . Metoprolol Other (See Comments)    Kidney failure  . Morphine And Related Other (See Comments)    Makes restless legs worse  . Nsaids Other (See Comments)  unknown  . Valsartan Other (See Comments)    REACTION: Kidney Stones    History   Social History  . Marital Status: Married    Spouse Name: N/A    Number of Children: N/A  . Years of Education: N/A   Occupational History  . Not on file.   Social History Main Topics  . Smoking status: Never Smoker   . Smokeless tobacco: Not on file  . Alcohol Use: No  . Drug Use: No  . Sexually Active: Not on file   Other Topics Concern  . Not on file   Social History Narrative  . No narrative on file    No family history on file.  Review of Systems:  As stated in the HPI and otherwise negative.   BP 125/72  Pulse 69  Ht 5\' 6"  (1.676 m)  Wt 218 lb (98.884 kg)  BMI 35.19 kg/m2  Physical Examination: General: Well developed, well nourished, NAD HEENT: OP clear, mucus membranes moist SKIN: warm, dry. No rashes. Neuro: No focal deficits Musculoskeletal: Muscle strength 5/5  all ext Psychiatric: Mood and affect normal Neck: No JVD, no carotid bruits, no thyromegaly, no lymphadenopathy. Lungs:Clear bilaterally, no wheezes, rhonci, crackles Cardiovascular: Regular rate and rhythm. No murmurs, gallops or rubs. Abdomen:Soft. Bowel sounds present. Non-tender.  Extremities: No lower extremity edema. Pulses are 2 + in the bilateral DP/PT.  EKG: NSR, rate 69 bpm. Old septal infarct. T wave abnormalities inferior leads, unchanged from old EKG.

## 2012-02-12 ENCOUNTER — Encounter (HOSPITAL_COMMUNITY): Payer: Self-pay
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
## 2012-02-21 ENCOUNTER — Encounter (HOSPITAL_COMMUNITY): Payer: Self-pay

## 2012-02-21 ENCOUNTER — Encounter (HOSPITAL_COMMUNITY)
Admission: RE | Admit: 2012-02-21 | Discharge: 2012-02-21 | Disposition: A | Discharge status: Home / Self Care | Payer: Medicare HMO | Source: Ambulatory Visit | Attending: Orthopaedic Surgery | Admitting: Orthopaedic Surgery

## 2012-02-21 HISTORY — DX: Depression, unspecified: F32.A

## 2012-02-21 HISTORY — DX: Blindness, one eye, unspecified eye: H54.40

## 2012-02-21 HISTORY — DX: Major depressive disorder, single episode, unspecified: F32.9

## 2012-02-21 HISTORY — DX: Hypothyroidism, unspecified: E03.9

## 2012-02-21 HISTORY — DX: Chronic kidney disease, unspecified: N18.9

## 2012-02-21 HISTORY — DX: Anxiety disorder, unspecified: F41.9

## 2012-02-21 HISTORY — DX: Unspecified osteoarthritis, unspecified site: M19.90

## 2012-02-21 LAB — DIFFERENTIAL
Lymphs Abs: 1.7 10*3/uL (ref 0.7–4.0)
Monocytes Relative: 8 % (ref 3–12)
Neutro Abs: 8.7 10*3/uL — ABNORMAL HIGH (ref 1.7–7.7)
Neutrophils Relative %: 75 % (ref 43–77)

## 2012-02-21 LAB — COMPREHENSIVE METABOLIC PANEL
Albumin: 4.2 g/dL (ref 3.5–5.2)
BUN: 19 mg/dL (ref 6–23)
Creatinine, Ser: 1.41 mg/dL — ABNORMAL HIGH (ref 0.50–1.35)
Potassium: 4.6 mEq/L (ref 3.5–5.1)
Total Protein: 7.6 g/dL (ref 6.0–8.3)

## 2012-02-21 LAB — URINALYSIS, ROUTINE W REFLEX MICROSCOPIC
Glucose, UA: NEGATIVE mg/dL
Hgb urine dipstick: NEGATIVE
Specific Gravity, Urine: 1.024 (ref 1.005–1.030)
Urobilinogen, UA: 0.2 mg/dL (ref 0.0–1.0)

## 2012-02-21 LAB — APTT: aPTT: 33 seconds (ref 24–37)

## 2012-02-21 LAB — SURGICAL PCR SCREEN
MRSA, PCR: NEGATIVE
Staphylococcus aureus: NEGATIVE

## 2012-02-21 LAB — PROTIME-INR: INR: 1.71 — ABNORMAL HIGH (ref 0.00–1.49)

## 2012-02-21 LAB — CBC
HCT: 42.9 % (ref 39.0–52.0)
MCHC: 34 g/dL (ref 30.0–36.0)
RDW: 14.1 % (ref 11.5–15.5)

## 2012-02-21 NOTE — Pre-Procedure Instructions (Signed)
20 RAYYAN BURLEY  02/21/2012   Your procedure is scheduled on:  02/27/2012  Report to [name] at 8:30 AM.  Call this number if you have problems the morning of surgery: [phone]   Remember:   Do not eat food & drink:After Midnight.  MONDAY      Take these medicines the morning of surgery with A SIP OF WATER: levothyroid, zoloft   Do not wear jewelry, make-up or nail polish.  Do not wear lotions, powders, or perfumes. You may wear deodorant.  Do not shave 48 hours prior to surgery. Men may shave face and neck.  Do not bring valuables to the hospital.  Contacts, dentures or bridgework may not be worn into surgery.  Leave suitcase in the car. After surgery it may be brought to your room.  For patients admitted to the hospital, checkout time is 11:00 AM the day of discharge.   Patients discharged the day of surgery will not be allowed to drive home.  Name and phone number of your driver: with WIFE  Special Instructions: CHG Shower Use Special Wash: 1/2 bottle night before surgery and 1/2 bottle morning of surgery.   Please read over the following fact sheets that you were given: Pain Booklet, Coughing and Deep Breathing, Blood Transfusion Information, Total Joint Packet, MRSA Information and Surgical Site Infection Prevention

## 2012-02-22 NOTE — Consult Note (Signed)
Anesthesia Chart Review:  Patient is a 61 year old male scheduled for left THR on 02/27/12 by Dr. Cleophas Dunker.  History includes DM2, CAD, severe AS and thoracic aortic aneurysm s/p Bentall procedure with St. Jude AVR '10, HTN, hypothyroidism, anxiety, OSA, right eye blindness, CRI,  kidney stones, complete heart block s/p St. Jude dual chamber PPM '10, non-smoker.  He receives primary care at [facility]) and was medically cleared by Paulene Floor, NP.    His Cardiologist is Dr. Clifton James.  He was seen on 01/23/12 for surgical clearance. EKG then showed NSR,rightware axis, old septal infarct, inferior T wave abnormality felt unchanged from prior.  He was cleared with Lovenox bridging recommended once Coumadin held five days pre-operatively.   He had a normal nuclear stress test on 02/14/11 with EF 55%.    Cardiac cath on 09/28/10 showed stable mild to moderate CAD (LM 30%, mid LAD 40-50%, CX AV grove and Ramus INT with mild plaque, RCA 40% diffuse stenosis.)  His last echo was on 12/03/09 and showed: - Left ventricle: The cavity size was normal. Wall thickness was increased increased in a pattern of mild to moderate LVH. Systolic function was mildly reduced. The estimated ejection fraction was 45%. There appears to be dyskinesis of the apical and distal septal myocardium. - Ventricular septum: Septal motion showed paradox. - Aortic valve: Moderately calcified annulus. A mechanical prosthesis was present. - Right ventricle: The cavity size was normal. Wall thickness was mildly increased.  CXR from [facility] on 10/18/11 showed no acute cardiopulmonary disease, atherosclerosis, and post-operative changes of median sternotomy/AVR and right sided PPM.  Labs noted.  Cr 1.41.  PT/INR elevated.  Will recheck PT/PTT on arrival.    Plan to proceed if follow-up labs reasonable and no new CV symptoms.  Short Stay nursing staff to follow-up on his PPM perioperative Rx form.  Shonna Chock,  PA-C

## 2012-02-26 MED ORDER — ACETAMINOPHEN 10 MG/ML IV SOLN
1000.0000 mg | Freq: Once | INTRAVENOUS | Status: AC
  Administered 2012-02-27: 1000 mg via INTRAVENOUS
  Filled 2012-02-26: qty 100

## 2012-02-26 MED ORDER — CEFAZOLIN SODIUM-DEXTROSE 2-3 GM-% IV SOLR
2.0000 g | INTRAVENOUS | Status: AC
  Administered 2012-02-27: 2 g via INTRAVENOUS
  Filled 2012-02-26: qty 50

## 2012-02-27 ENCOUNTER — Inpatient Hospital Stay (HOSPITAL_COMMUNITY): Payer: Medicare HMO

## 2012-02-27 ENCOUNTER — Ambulatory Visit (HOSPITAL_COMMUNITY): Payer: Medicare HMO | Admitting: Vascular Surgery

## 2012-02-27 ENCOUNTER — Encounter (HOSPITAL_COMMUNITY): Payer: Self-pay | Admitting: Vascular Surgery

## 2012-02-27 ENCOUNTER — Ambulatory Visit (HOSPITAL_COMMUNITY): Payer: Medicare HMO

## 2012-02-27 ENCOUNTER — Encounter (HOSPITAL_COMMUNITY): Payer: Self-pay | Admitting: *Deleted

## 2012-02-27 ENCOUNTER — Inpatient Hospital Stay (HOSPITAL_COMMUNITY)
Admission: RE | Admit: 2012-02-27 | Discharge: 2012-03-01 | DRG: 470 | Disposition: A | Discharge status: Home Health | Payer: Medicare HMO | Source: Ambulatory Visit | Attending: Orthopaedic Surgery | Admitting: Orthopaedic Surgery

## 2012-02-27 ENCOUNTER — Encounter (HOSPITAL_COMMUNITY)
Admission: RE | Disposition: A | Discharge status: Home Health | Payer: Self-pay | Source: Ambulatory Visit | Attending: Orthopaedic Surgery

## 2012-02-27 DIAGNOSIS — Z9861 Coronary angioplasty status: Secondary | ICD-10-CM

## 2012-02-27 DIAGNOSIS — F329 Major depressive disorder, single episode, unspecified: Secondary | ICD-10-CM | POA: Diagnosis present

## 2012-02-27 DIAGNOSIS — Z888 Allergy status to other drugs, medicaments and biological substances status: Secondary | ICD-10-CM

## 2012-02-27 DIAGNOSIS — E039 Hypothyroidism, unspecified: Secondary | ICD-10-CM | POA: Diagnosis present

## 2012-02-27 DIAGNOSIS — G4730 Sleep apnea, unspecified: Secondary | ICD-10-CM | POA: Diagnosis present

## 2012-02-27 DIAGNOSIS — E119 Type 2 diabetes mellitus without complications: Secondary | ICD-10-CM | POA: Diagnosis present

## 2012-02-27 DIAGNOSIS — F3289 Other specified depressive episodes: Secondary | ICD-10-CM | POA: Diagnosis present

## 2012-02-27 DIAGNOSIS — Z954 Presence of other heart-valve replacement: Secondary | ICD-10-CM

## 2012-02-27 DIAGNOSIS — N189 Chronic kidney disease, unspecified: Secondary | ICD-10-CM | POA: Diagnosis present

## 2012-02-27 DIAGNOSIS — Z794 Long term (current) use of insulin: Secondary | ICD-10-CM

## 2012-02-27 DIAGNOSIS — D62 Acute posthemorrhagic anemia: Secondary | ICD-10-CM | POA: Diagnosis not present

## 2012-02-27 DIAGNOSIS — Z7901 Long term (current) use of anticoagulants: Secondary | ICD-10-CM

## 2012-02-27 DIAGNOSIS — Z87442 Personal history of urinary calculi: Secondary | ICD-10-CM

## 2012-02-27 DIAGNOSIS — Z8249 Family history of ischemic heart disease and other diseases of the circulatory system: Secondary | ICD-10-CM

## 2012-02-27 DIAGNOSIS — F411 Generalized anxiety disorder: Secondary | ICD-10-CM | POA: Diagnosis present

## 2012-02-27 DIAGNOSIS — M19049 Primary osteoarthritis, unspecified hand: Secondary | ICD-10-CM | POA: Diagnosis present

## 2012-02-27 DIAGNOSIS — M8700 Idiopathic aseptic necrosis of unspecified bone: Secondary | ICD-10-CM | POA: Diagnosis present

## 2012-02-27 DIAGNOSIS — M87059 Idiopathic aseptic necrosis of unspecified femur: Principal | ICD-10-CM | POA: Diagnosis present

## 2012-02-27 DIAGNOSIS — E669 Obesity, unspecified: Secondary | ICD-10-CM | POA: Diagnosis present

## 2012-02-27 DIAGNOSIS — I129 Hypertensive chronic kidney disease with stage 1 through stage 4 chronic kidney disease, or unspecified chronic kidney disease: Secondary | ICD-10-CM | POA: Diagnosis present

## 2012-02-27 DIAGNOSIS — H5440 Blindness, one eye, unspecified eye: Secondary | ICD-10-CM | POA: Diagnosis present

## 2012-02-27 DIAGNOSIS — Z950 Presence of cardiac pacemaker: Secondary | ICD-10-CM

## 2012-02-27 DIAGNOSIS — I2510 Atherosclerotic heart disease of native coronary artery without angina pectoris: Secondary | ICD-10-CM | POA: Diagnosis present

## 2012-02-27 DIAGNOSIS — Z6834 Body mass index (BMI) 34.0-34.9, adult: Secondary | ICD-10-CM

## 2012-02-27 DIAGNOSIS — Z79899 Other long term (current) drug therapy: Secondary | ICD-10-CM

## 2012-02-27 DIAGNOSIS — E871 Hypo-osmolality and hyponatremia: Secondary | ICD-10-CM | POA: Diagnosis not present

## 2012-02-27 HISTORY — PX: TOTAL HIP ARTHROPLASTY: SHX124

## 2012-02-27 LAB — APTT: aPTT: 37 seconds (ref 24–37)

## 2012-02-27 LAB — GLUCOSE, CAPILLARY
Glucose-Capillary: 155 mg/dL — ABNORMAL HIGH (ref 70–99)
Glucose-Capillary: 145 mg/dL — ABNORMAL HIGH (ref 70–99)
Glucose-Capillary: 188 mg/dL — ABNORMAL HIGH (ref 70–99)

## 2012-02-27 SURGERY — ARTHROPLASTY, HIP, TOTAL,POSTERIOR APPROACH
Anesthesia: Regional | Site: Hip | Laterality: Left | Wound class: Clean

## 2012-02-27 MED ORDER — ONDANSETRON HCL 4 MG/2ML IJ SOLN: 4.0000 mg | Freq: Once | INTRAMUSCULAR | Status: DC | PRN

## 2012-02-27 MED ORDER — ENOXAPARIN SODIUM 100 MG/ML ~~LOC~~ SOLN
100.0000 mg | Freq: Two times a day (BID) | SUBCUTANEOUS | Status: DC
  Filled 2012-02-27: qty 1

## 2012-02-27 MED ORDER — ACETAMINOPHEN 10 MG/ML IV SOLN
1000.0000 mg | Freq: Four times a day (QID) | INTRAVENOUS | Status: AC
  Administered 2012-02-27 – 2012-02-28 (×4): 1000 mg via INTRAVENOUS
  Filled 2012-02-27 (×4): qty 100

## 2012-02-27 MED ORDER — LIDOCAINE HCL (CARDIAC) 20 MG/ML IV SOLN
INTRAVENOUS | Status: DC | PRN
  Administered 2012-02-27: 100 mg via INTRAVENOUS

## 2012-02-27 MED ORDER — HYDROMORPHONE HCL PF 1 MG/ML IJ SOLN
INTRAMUSCULAR | Status: AC
  Administered 2012-02-27: 0.5 mg via INTRAVENOUS
  Filled 2012-02-27: qty 1

## 2012-02-27 MED ORDER — TAMSULOSIN HCL 0.4 MG PO CAPS
0.4000 mg | ORAL_CAPSULE | Freq: Every day | ORAL | Status: DC | PRN
  Filled 2012-02-27: qty 1

## 2012-02-27 MED ORDER — DEXTROSE 5 % IV SOLN
INTRAVENOUS | Status: DC | PRN
  Administered 2012-02-27 (×2): via INTRAVENOUS

## 2012-02-27 MED ORDER — ENOXAPARIN SODIUM 100 MG/ML ~~LOC~~ SOLN
100.0000 mg | Freq: Two times a day (BID) | SUBCUTANEOUS | Status: DC
  Administered 2012-02-28 – 2012-02-29 (×2): 100 mg via SUBCUTANEOUS
  Filled 2012-02-27 (×5): qty 1

## 2012-02-27 MED ORDER — DOCUSATE SODIUM 100 MG PO CAPS
100.0000 mg | ORAL_CAPSULE | Freq: Two times a day (BID) | ORAL | Status: DC
  Administered 2012-02-27 – 2012-03-01 (×7): 100 mg via ORAL
  Filled 2012-02-27 (×8): qty 1

## 2012-02-27 MED ORDER — NEOSTIGMINE METHYLSULFATE 1 MG/ML IJ SOLN
INTRAMUSCULAR | Status: DC | PRN
  Administered 2012-02-27: 3 mg via INTRAVENOUS

## 2012-02-27 MED ORDER — IRBESARTAN 150 MG PO TABS
150.0000 mg | ORAL_TABLET | Freq: Every day | ORAL | Status: DC
  Administered 2012-02-28 – 2012-02-29 (×2): 150 mg via ORAL
  Filled 2012-02-27 (×4): qty 1

## 2012-02-27 MED ORDER — PHENOL 1.4 % MT LIQD: 1.0000 | OROMUCOSAL | Status: DC | PRN

## 2012-02-27 MED ORDER — MENTHOL 3 MG MT LOZG: 1.0000 | LOZENGE | OROMUCOSAL | Status: DC | PRN

## 2012-02-27 MED ORDER — METOCLOPRAMIDE HCL 10 MG PO TABS: 5.0000 mg | ORAL_TABLET | Freq: Three times a day (TID) | ORAL | Status: DC | PRN

## 2012-02-27 MED ORDER — PHENYLEPHRINE HCL 10 MG/ML IJ SOLN
INTRAMUSCULAR | Status: DC | PRN
  Administered 2012-02-27 (×3): 80 ug via INTRAVENOUS
  Administered 2012-02-27 (×2): 120 ug via INTRAVENOUS

## 2012-02-27 MED ORDER — 0.9 % SODIUM CHLORIDE (POUR BTL) OPTIME
TOPICAL | Status: DC | PRN
  Administered 2012-02-27: 1000 mL

## 2012-02-27 MED ORDER — FENTANYL CITRATE 0.05 MG/ML IJ SOLN
INTRAMUSCULAR | Status: DC | PRN
  Administered 2012-02-27: 50 ug via INTRAVENOUS
  Administered 2012-02-27 (×2): 100 ug via INTRAVENOUS
  Administered 2012-02-27: 50 ug via INTRAVENOUS
  Administered 2012-02-27: 100 ug via INTRAVENOUS
  Administered 2012-02-27 (×2): 50 ug via INTRAVENOUS

## 2012-02-27 MED ORDER — SERTRALINE HCL 100 MG PO TABS
100.0000 mg | ORAL_TABLET | Freq: Every day | ORAL | Status: DC
  Administered 2012-02-28 – 2012-03-01 (×3): 100 mg via ORAL
  Filled 2012-02-27 (×3): qty 1

## 2012-02-27 MED ORDER — ONDANSETRON HCL 4 MG/2ML IJ SOLN
INTRAMUSCULAR | Status: DC | PRN
  Administered 2012-02-27: 4 mg via INTRAVENOUS

## 2012-02-27 MED ORDER — CHLORHEXIDINE GLUCONATE 4 % EX LIQD: 60.0000 mL | Freq: Every day | CUTANEOUS | Status: DC

## 2012-02-27 MED ORDER — FOLIC ACID 1 MG PO TABS: 1.0000 mg | ORAL_TABLET | Freq: Every day | ORAL | Status: DC

## 2012-02-27 MED ORDER — CHLORHEXIDINE GLUCONATE 4 % EX LIQD: 60.0000 mL | Freq: Once | CUTANEOUS | Status: DC

## 2012-02-27 MED ORDER — SUCCINYLCHOLINE CHLORIDE 20 MG/ML IJ SOLN
INTRAMUSCULAR | Status: DC | PRN
  Administered 2012-02-27: 100 mg via INTRAVENOUS

## 2012-02-27 MED ORDER — ACETAMINOPHEN 10 MG/ML IV SOLN
INTRAVENOUS | Status: AC
  Filled 2012-02-27: qty 100

## 2012-02-27 MED ORDER — INSULIN ASPART 100 UNIT/ML ~~LOC~~ SOLN
0.0000 [IU] | Freq: Three times a day (TID) | SUBCUTANEOUS | Status: DC
  Administered 2012-02-28 – 2012-02-29 (×4): 3 [IU] via SUBCUTANEOUS

## 2012-02-27 MED ORDER — HETASTARCH-ELECTROLYTES 6 % IV SOLN
INTRAVENOUS | Status: DC | PRN
  Administered 2012-02-27: 13:00:00 via INTRAVENOUS

## 2012-02-27 MED ORDER — HYDROMORPHONE HCL PF 1 MG/ML IJ SOLN
INTRAMUSCULAR | Status: AC
  Filled 2012-02-27: qty 1

## 2012-02-27 MED ORDER — LACTATED RINGERS IV SOLN
INTRAVENOUS | Status: DC | PRN
  Administered 2012-02-27 (×3): via INTRAVENOUS

## 2012-02-27 MED ORDER — HYDROMORPHONE HCL PF 1 MG/ML IJ SOLN
0.2500 mg | INTRAMUSCULAR | Status: AC | PRN
  Administered 2012-02-27 (×8): 0.5 mg via INTRAVENOUS

## 2012-02-27 MED ORDER — LACTATED RINGERS IV SOLN
INTRAVENOUS | Status: DC
  Administered 2012-02-27: 10:00:00 via INTRAVENOUS

## 2012-02-27 MED ORDER — GLYCOPYRROLATE 0.2 MG/ML IJ SOLN
INTRAMUSCULAR | Status: DC | PRN
  Administered 2012-02-27: 0.4 mg via INTRAVENOUS

## 2012-02-27 MED ORDER — WARFARIN - PHARMACIST DOSING INPATIENT: Freq: Every day | Status: DC

## 2012-02-27 MED ORDER — FOLIC ACID 1 MG PO TABS
1.5000 mg | ORAL_TABLET | Freq: Every day | ORAL | Status: DC
  Administered 2012-02-27 – 2012-03-01 (×4): 1.5 mg via ORAL
  Filled 2012-02-27 (×4): qty 1

## 2012-02-27 MED ORDER — INSULIN ASPART 100 UNIT/ML ~~LOC~~ SOLN: 0.0000 [IU] | Freq: Every day | SUBCUTANEOUS | Status: DC

## 2012-02-27 MED ORDER — SODIUM CHLORIDE 0.9 % IV SOLN: 75.0000 mL/h | INTRAVENOUS | Status: DC

## 2012-02-27 MED ORDER — FOLIC ACID 400 MCG PO TABS: 400.0000 ug | ORAL_TABLET | Freq: Every day | ORAL | Status: DC

## 2012-02-27 MED ORDER — OXYCODONE HCL 5 MG PO TABS
5.0000 mg | ORAL_TABLET | ORAL | Status: DC | PRN
  Administered 2012-02-27 – 2012-02-28 (×4): 10 mg via ORAL
  Filled 2012-02-27 (×4): qty 2

## 2012-02-27 MED ORDER — METOCLOPRAMIDE HCL 5 MG/ML IJ SOLN: 5.0000 mg | Freq: Three times a day (TID) | INTRAMUSCULAR | Status: DC | PRN

## 2012-02-27 MED ORDER — ROCURONIUM BROMIDE 100 MG/10ML IV SOLN
INTRAVENOUS | Status: DC | PRN
  Administered 2012-02-27: 50 mg via INTRAVENOUS

## 2012-02-27 MED ORDER — LEVOTHYROXINE SODIUM 88 MCG PO TABS
88.0000 ug | ORAL_TABLET | Freq: Every day | ORAL | Status: DC
  Administered 2012-02-28 – 2012-03-01 (×3): 88 ug via ORAL
  Filled 2012-02-27 (×4): qty 1

## 2012-02-27 MED ORDER — SODIUM CHLORIDE 0.9 % IV SOLN: INTRAVENOUS | Status: DC

## 2012-02-27 MED ORDER — HYDROMORPHONE HCL PF 1 MG/ML IJ SOLN
0.5000 mg | INTRAMUSCULAR | Status: DC | PRN
  Administered 2012-02-27 (×2): 0.5 mg via INTRAVENOUS
  Administered 2012-02-28: 11:00:00 via INTRAVENOUS
  Administered 2012-02-28 (×3): 0.5 mg via INTRAVENOUS
  Filled 2012-02-27 (×4): qty 1

## 2012-02-27 MED ORDER — METHOCARBAMOL 500 MG PO TABS
500.0000 mg | ORAL_TABLET | Freq: Four times a day (QID) | ORAL | Status: DC | PRN
  Administered 2012-02-27 – 2012-03-01 (×10): 500 mg via ORAL
  Filled 2012-02-27 (×11): qty 1

## 2012-02-27 MED ORDER — WARFARIN SODIUM 7.5 MG PO TABS
7.5000 mg | ORAL_TABLET | Freq: Once | ORAL | Status: AC
  Administered 2012-02-27: 7.5 mg via ORAL
  Filled 2012-02-27: qty 1

## 2012-02-27 MED ORDER — FENOFIBRATE 160 MG PO TABS
160.0000 mg | ORAL_TABLET | Freq: Every day | ORAL | Status: DC
  Administered 2012-02-27 – 2012-03-01 (×4): 160 mg via ORAL
  Filled 2012-02-27 (×4): qty 1

## 2012-02-27 MED ORDER — TRAZODONE HCL 100 MG PO TABS
100.0000 mg | ORAL_TABLET | Freq: Every day | ORAL | Status: DC
  Administered 2012-02-27 – 2012-03-01 (×2): 100 mg via ORAL
  Filled 2012-02-27 (×5): qty 1

## 2012-02-27 MED ORDER — ONDANSETRON HCL 4 MG/2ML IJ SOLN: 4.0000 mg | Freq: Four times a day (QID) | INTRAMUSCULAR | Status: DC | PRN

## 2012-02-27 MED ORDER — ONDANSETRON HCL 4 MG PO TABS
4.0000 mg | ORAL_TABLET | Freq: Four times a day (QID) | ORAL | Status: DC | PRN
  Administered 2012-03-01: 4 mg via ORAL
  Filled 2012-02-27: qty 1

## 2012-02-27 MED ORDER — METHOCARBAMOL 100 MG/ML IJ SOLN
500.0000 mg | Freq: Four times a day (QID) | INTRAVENOUS | Status: DC | PRN
  Administered 2012-02-27: 500 mg via INTRAVENOUS
  Filled 2012-02-27: qty 5

## 2012-02-27 MED ORDER — BUPIVACAINE-EPINEPHRINE 0.5% -1:200000 IJ SOLN
INTRAMUSCULAR | Status: DC | PRN
  Administered 2012-02-27: 15 mL

## 2012-02-27 MED ORDER — CEFAZOLIN SODIUM-DEXTROSE 2-3 GM-% IV SOLR
2.0000 g | Freq: Four times a day (QID) | INTRAVENOUS | Status: AC
  Administered 2012-02-27 (×2): 2 g via INTRAVENOUS
  Filled 2012-02-27 (×2): qty 50

## 2012-02-27 MED ORDER — PROPOFOL 10 MG/ML IV EMUL
INTRAVENOUS | Status: DC | PRN
  Administered 2012-02-27: 165 mg via INTRAVENOUS

## 2012-02-27 MED ORDER — MIDAZOLAM HCL 5 MG/5ML IJ SOLN
INTRAMUSCULAR | Status: DC | PRN
  Administered 2012-02-27: 2 mg via INTRAVENOUS

## 2012-02-27 SURGICAL SUPPLY — 66 items
APL SKNCLS STERI-STRIP NONHPOA (GAUZE/BANDAGES/DRESSINGS) ×1
BENZOIN TINCTURE PRP APPL 2/3 (GAUZE/BANDAGES/DRESSINGS) ×1 IMPLANT
BLADE SAW SAG 73X25 THK (BLADE) ×1
BLADE SAW SGTL 73X25 THK (BLADE) ×1 IMPLANT
BRUSH FEMORAL CANAL (MISCELLANEOUS) IMPLANT
CANISTER WOUND CARE 500ML ATS (WOUND CARE) ×1 IMPLANT
CLOTH BEACON ORANGE TIMEOUT ST (SAFETY) ×2 IMPLANT
COVER BACK TABLE 24X17X13 BIG (DRAPES) IMPLANT
COVER SURGICAL LIGHT HANDLE (MISCELLANEOUS) ×2 IMPLANT
DRAPE INCISE IOBAN 66X45 STRL (DRAPES) ×1 IMPLANT
DRAPE ORTHO SPLIT 77X108 STRL (DRAPES) ×4
DRAPE SURG ORHT 6 SPLT 77X108 (DRAPES) ×2 IMPLANT
DRSG ADAPTIC 3X8 NADH LF (GAUZE/BANDAGES/DRESSINGS) ×2 IMPLANT
DRSG MEPILEX BORDER 4X12 (GAUZE/BANDAGES/DRESSINGS) ×1 IMPLANT
DRSG VAC ATS MED SENSATRAC (GAUZE/BANDAGES/DRESSINGS) ×1 IMPLANT
DURAPREP 26ML APPLICATOR (WOUND CARE) ×3 IMPLANT
ELECT BLADE 4.0 EZ CLEAN MEGAD (MISCELLANEOUS) ×2
ELECT BLADE 6.5 EXT (BLADE) IMPLANT
ELECT REM PT RETURN 9FT ADLT (ELECTROSURGICAL) ×2
ELECTRODE BLDE 4.0 EZ CLN MEGD (MISCELLANEOUS) IMPLANT
ELECTRODE REM PT RTRN 9FT ADLT (ELECTROSURGICAL) ×1 IMPLANT
EVACUATOR 1/8 PVC DRAIN (DRAIN) IMPLANT
FACESHIELD LNG OPTICON STERILE (SAFETY) ×5 IMPLANT
GLOVE BIOGEL PI IND STRL 7.0 (GLOVE) IMPLANT
GLOVE BIOGEL PI IND STRL 7.5 (GLOVE) IMPLANT
GLOVE BIOGEL PI IND STRL 8 (GLOVE) ×2 IMPLANT
GLOVE BIOGEL PI IND STRL 8.5 (GLOVE) IMPLANT
GLOVE BIOGEL PI INDICATOR 7.0 (GLOVE) ×1
GLOVE BIOGEL PI INDICATOR 7.5 (GLOVE) ×1
GLOVE BIOGEL PI INDICATOR 8 (GLOVE) ×2
GLOVE BIOGEL PI INDICATOR 8.5 (GLOVE) ×1
GLOVE ECLIPSE 8.0 STRL XLNG CF (GLOVE) ×2 IMPLANT
GLOVE SURG ORTHO 8.5 STRL (GLOVE) ×4 IMPLANT
GLOVE SURG SS PI 6.5 STRL IVOR (GLOVE) ×1 IMPLANT
GOWN PREVENTION PLUS XLARGE (GOWN DISPOSABLE) ×4 IMPLANT
GOWN STRL NON-REIN LRG LVL3 (GOWN DISPOSABLE) ×2 IMPLANT
HANDPIECE INTERPULSE COAX TIP (DISPOSABLE)
IMMOBILIZER KNEE 20 (SOFTGOODS)
IMMOBILIZER KNEE 20 THIGH 36 (SOFTGOODS) IMPLANT
IMMOBILIZER KNEE 22 UNIV (SOFTGOODS) ×1 IMPLANT
IMMOBILIZER KNEE 24 THIGH 36 (MISCELLANEOUS) IMPLANT
IMMOBILIZER KNEE 24 UNIV (MISCELLANEOUS)
KIT BASIN OR (CUSTOM PROCEDURE TRAY) ×2 IMPLANT
KIT ROOM TURNOVER OR (KITS) ×2 IMPLANT
MANIFOLD NEPTUNE II (INSTRUMENTS) ×2 IMPLANT
NEEDLE 22X1 1/2 (OR ONLY) (NEEDLE) ×2 IMPLANT
NS IRRIG 1000ML POUR BTL (IV SOLUTION) ×2 IMPLANT
PACK TOTAL JOINT (CUSTOM PROCEDURE TRAY) ×2 IMPLANT
PAD ARMBOARD 7.5X6 YLW CONV (MISCELLANEOUS) ×3 IMPLANT
PRESSURIZER FEMORAL UNIV (MISCELLANEOUS) IMPLANT
SET HNDPC FAN SPRY TIP SCT (DISPOSABLE) IMPLANT
STAPLER VISISTAT 35W (STAPLE) ×2 IMPLANT
SUCTION FRAZIER TIP 10 FR DISP (SUCTIONS) ×2 IMPLANT
SUT BONE WAX W31G (SUTURE) IMPLANT
SUT ETHIBOND NAB CT1 #1 30IN (SUTURE) ×8 IMPLANT
SUT MNCRL AB 3-0 PS2 18 (SUTURE) ×1 IMPLANT
SUT VIC AB 0 CT1 27 (SUTURE) ×8
SUT VIC AB 0 CT1 27XBRD ANBCTR (SUTURE) ×3 IMPLANT
SUT VIC AB 2-0 CT1 27 (SUTURE) ×4
SUT VIC AB 2-0 CT1 TAPERPNT 27 (SUTURE) ×1 IMPLANT
SYR CONTROL 10ML LL (SYRINGE) ×2 IMPLANT
TOWEL OR 17X24 6PK STRL BLUE (TOWEL DISPOSABLE) ×2 IMPLANT
TOWEL OR 17X26 10 PK STRL BLUE (TOWEL DISPOSABLE) ×2 IMPLANT
TOWER CARTRIDGE SMART MIX (DISPOSABLE) IMPLANT
TRAY FOLEY CATH 14FR (SET/KITS/TRAYS/PACK) ×2 IMPLANT
WATER STERILE IRR 1000ML POUR (IV SOLUTION) ×6 IMPLANT

## 2012-02-27 NOTE — Anesthesia Postprocedure Evaluation (Signed)
  Anesthesia Post-op Note  Patient: Andre Malone  Procedure(s) Performed: Procedure(s) (LRB): TOTAL HIP ARTHROPLASTY (Left)  Patient Location: PACU  Anesthesia Type: General  Level of Consciousness: awake, alert  and oriented  Airway and Oxygen Therapy: Patient Spontanous Breathing and Patient connected to nasal cannula oxygen  Post-op Pain: mild  Post-op Assessment: Post-op Vital signs reviewed  Post-op Vital Signs: Reviewed  Complications: No apparent anesthesia complications

## 2012-02-27 NOTE — Progress Notes (Signed)
Patient ID: Andre Malone, male   DOB: 1950-12-20, 61 y.o.   MRN: [id number] There has been no change in health status since  the current H&P.I have examined the patient and discussed the surgery. No contraindications to the planned procedure exist.

## 2012-02-27 NOTE — Op Note (Signed)
PATIENT ID:      Andre Malone  MRN:     [id number] DOB/AGE:    1951/06/29 / 61 y.o.       OPERATIVE REPORT    DATE OF PROCEDURE:  02/27/2012       PREOPERATIVE DIAGNOSIS:   Avascular necrosis left hip                                                       BMI 34.2     POSTOPERATIVE DIAGNOSIS:   Avascular necrosis left hip                                                                     There is no height or weight on file to calculate BMI.                                                            same  PROCEDURE:  Procedure(s):LEFT TOTAL HIP ARTHROPLASTY     SURGEON:  [name], MD    ASSISTANT:   Jacqualine Code, PA-C   (Present and scrubbed throughout the case, critical for assistance with exposure, retraction, instrumentation, and closure.)          ANESTHESIA:general     DRAINS: none :      TOURNIQUET TIME: * No tourniquets in log *    COMPLICATIONS:  None   CONDITION:  stable  PROCEDURE IN DETAIL: dictated   Andre Malone W 02/27/2012, 1:10 PM

## 2012-02-27 NOTE — Transfer of Care (Signed)
Immediate Anesthesia Transfer of Care Note  Patient: Andre Malone  Procedure(s) Performed: Procedure(s) (LRB): TOTAL HIP ARTHROPLASTY (Left)  Patient Location: PACU  Anesthesia Type: General  Level of Consciousness: awake, alert , oriented and patient cooperative  Airway & Oxygen Therapy: Patient Spontanous Breathing and Patient connected to nasal cannula oxygen  Post-op Assessment: Report given to PACU RN and Post -op Vital signs reviewed and stable  Post vital signs: Reviewed and stable  Complications: No apparent anesthesia complications
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
## 2012-02-27 NOTE — Op Note (Signed)
NAME:  Andre Malone, Andre Malone NO.:  [id number]  MEDICAL RECORD NO.:  [id number]  LOCATION:  [id number]                        FACILITY:  [facility]  PHYSICIAN:  Claude Manges. Hayward Rylander, M.D.DATE OF BIRTH:  1951-06-05  DATE OF PROCEDURE:  02/27/2012 DATE OF DISCHARGE:                              OPERATIVE REPORT   PREOPERATIVE DIAGNOSIS:  Avascular necrosis, left femoral head.  POSTOPERATIVE DIAGNOSIS:  Avascular necrosis, left femoral head.  PROCEDURES:  Left total hip replacement.  SURGEON:  Claude Manges. Cleophas Dunker, MD.  ASSISTANTArlys John D. Petrarca, PA-C.  ANESTHESIA:  General.  COMPLICATIONS:  None.  COMPONENTS:  DePuy AML 13.5 mm small stature femoral stem, -2 mm neck length with a 36- mm outer diameter hip ball, 54 mm outer diameter Gription metallic acetabular cup with 3 central holes (no screws utilized), a +4 marathon polyethylene liner and apex hole eliminator.  PROCEDURE:  Andre Malone was met in the Holding Area and identified his left hip as the appropriate operative site.  He was then transported to room number 7 and placed under general anesthesia without difficulty. He was then positioned in the lateral decubitus position with the left side up and secured to the operating room table with the Innomed hip system.  The nursing staff did insert a Foley catheter prior to positioning and the urine was clear.  Left hip was then prepped with Betadine scrub and then DuraPrep from the iliac crest about the midcalf.  Sterile draping was performed.  A routine Southern incision was utilized via sharp dissection carried down to subcutaneous tissue.  There was abundant adipose tissue with a BMI of just over 34.  The adipose tissue was incised with the Bovie. Bleeders were Bovie coagulated.  The iliotibial band was identified and incised along the length of the skin incision.  Self-retaining retractors were placed more deeply.  With the hip internally rotated, the short  external rotators were identified, tendinous structures were tagged with 0 Ethibond suture.  The structures were then reduced from the posterior aspect of the greater trochanter.  I could palpate the sciatic nerve throughout the procedure, what appeared to be very well protected.  The hip capsule was identified.  It was incised along length of the skin incision.  There was a clear-yellow joint effusion.  Hip was then easily dislocated posteriorly.  There appeared to be some ping-pong effect from the area of avascular necrosis.  I amputated the femoral head a fingerbreadth proximal to the lesser trochanter using the oscillating saw and the calcar guide.  Retractors were placed around the hip and made a starter hole in the piriformis fossa and then reaming was performed sequentially to 13 mm to accept a 13.5 component.  Rasping was performed sequentially to 13.5 small stature, a calcar reamer was used to obtain the appropriate calcar angle.  There did not appear to be any cracks in the calcar.  Retractors then placed about the acetabulum.  I released the labrum with a 15 blade knife.  Reaming was then performed of the acetabulum sequentially to 53 mm to accept a 54 mm outer diameter component, that had a very nice deep acetabulum with circumferential bleeding.  I trialed a  52 and then a 54 component, the 52 would seat and was slightly tight, 54 would not seat.  I then inserted the 54 mm outer diameter Gription metallic acetabular component and it appeared to be an excellent fit, nice and tight.  I then trialed the polyethylene liner and then reinserted the 13.5 femoral component.  We trialed several neck lengths and felt that the -2 actually gave [place] perfect stability and equalized leg lengths.  He appeared to be slightly short preoperatively.  At that point, we had perfect stability in all range of motion.  The trial components were removed.  We copiously irrigated the  operative site throughout the procedure, particularly while inserting the trial components.  The acetabulum was then irrigated and we inserted the apex hole eliminator, followed by the marathon polyethylene liner.  We carefully impacted 13.5 mm short stature femoral component on the calcar.  We cleaned the [facility] taper neck and inserted the -2 neck length with a 36-mm outer diameter hip ball.  We then reduced the entire components and went through a full range of motion and had perfect stability without any toggling.  The wound was irrigated again with saline solution.  I closed the capsule anatomically with 0 Ethibond.  The short external rotators were closed with similar material.  The capsule was infiltrated 0.25% Marcaine with epinephrine.  The iliotibial band was closed with a running #1 Vicryl and the subcu was closed in several layers with 0 and 2-0 Vicryl and then a 4-0 Monocryl.  Skin was closed with skin clips. Because the patient is large and has been on Lovenox and Coumadin, we elected to use an incisional VAC for 2 days.  This was applied without difficulty.  The patient was then woken, placed in a position, transferred to the operating room stretcher and then to recovery room without difficulty.     Claude Manges. Cleophas Dunker, M.D.     PWW/MEDQ  D:  02/27/2012  T:  02/27/2012  Job:  [id number]

## 2012-02-27 NOTE — Anesthesia Procedure Notes (Signed)
Procedure Name: Intubation Date/Time: 02/27/2012 10:42 AM Performed by: Tyrone Nine Pre-anesthesia Checklist: Emergency Drugs available, Suction available, Patient being monitored, Patient identified and Timeout performed Patient Re-evaluated:Patient Re-evaluated prior to inductionOxygen Delivery Method: Circle system utilized Preoxygenation: Pre-oxygenation with 100% oxygen Intubation Type: IV induction Ventilation: Mask ventilation without difficulty and Oral airway inserted - appropriate to patient size Laryngoscope Size: Mac and 3 Grade View: Grade I Tube type: Oral Number of attempts: 1 Airway Equipment and Method: Stylet and LTA kit utilized Placement Confirmation: ETT inserted through vocal cords under direct vision,  breath sounds checked- equal and bilateral,  positive ETCO2 and CO2 detector Secured at: 23 cm Tube secured with: Tape Dental Injury: Teeth and Oropharynx as per pre-operative assessment

## 2012-02-27 NOTE — Progress Notes (Signed)
Orthopedic Tech Progress Note Patient Details:  Andre Malone 1951-05-28 [id number]   Frame applied per nursing order  [place] 02/27/2012, 7:12 PM

## 2012-02-27 NOTE — Progress Notes (Signed)
Patient reports falling Friday with skin tear to left foot and hematoma to left chest wall and left face. Did not remove band aid from left foot.

## 2012-02-27 NOTE — Preoperative (Signed)
Beta Blockers   Reason not to administer Beta Blockers:Not Applicable 

## 2012-02-27 NOTE — Anesthesia Preprocedure Evaluation (Addendum)
Anesthesia Evaluation  Patient identified by MRN, date of birth, ID band Patient awake    Reviewed: Allergy & Precautions, H&P , NPO status , Patient's Chart, lab work & pertinent test results  Airway Mallampati: II TM Distance: >3 FB     Dental  (+) Edentulous Upper, Teeth Intact and Dental Advisory Given   Pulmonary    Pulmonary exam normal       Cardiovascular Exercise Tolerance: Poor hypertension, Pt. on medications + CAD, + Cardiac Stents and + DOE + pacemaker     Neuro/Psych Anxiety Depression    GI/Hepatic negative GI ROS, Neg liver ROS,   Endo/Other  Diabetes mellitus-, Type 2, Insulin DependentHypothyroidism   Renal/GU Renal InsufficiencyRenal disease     Musculoskeletal  (+) Arthritis -, Osteoarthritis,    Abdominal Normal abdominal exam  (+)   Peds  Hematology negative hematology ROS (+)   Anesthesia Other Findings   Reproductive/Obstetrics                          Anesthesia Physical Anesthesia Plan  ASA: III  Anesthesia Plan: General   Post-op Pain Management:    Induction: Intravenous  Airway Management Planned: Oral ETT  Additional Equipment:   Intra-op Plan:   Post-operative Plan: Extubation in OR  Informed Consent: I have reviewed the patients History and Physical, chart, labs and discussed the procedure including the risks, benefits and alternatives for the proposed anesthesia with the patient or authorized representative who has indicated his/her understanding and acceptance.   Dental advisory given  Plan Discussed with:   Anesthesia Plan Comments:         Anesthesia Quick Evaluation

## 2012-02-28 LAB — CBC
HCT: 28.4 % — ABNORMAL LOW (ref 39.0–52.0)
Hemoglobin: 9.6 g/dL — ABNORMAL LOW (ref 13.0–17.0)
MCH: 29.5 pg (ref 26.0–34.0)
MCHC: 33.8 g/dL (ref 30.0–36.0)
RBC: 3.25 MIL/uL — ABNORMAL LOW (ref 4.22–5.81)

## 2012-02-28 LAB — PROTIME-INR: INR: 1.36 (ref 0.00–1.49)

## 2012-02-28 LAB — BASIC METABOLIC PANEL
BUN: 17 mg/dL (ref 6–23)
CO2: 22 mEq/L (ref 19–32)
GFR calc non Af Amer: 54 mL/min — ABNORMAL LOW (ref >90)
Glucose, Bld: 181 mg/dL — ABNORMAL HIGH (ref 70–99)
Potassium: 4.2 mEq/L (ref 3.5–5.1)

## 2012-02-28 LAB — HEMOGLOBIN A1C
Hgb A1c MFr Bld: 6.3 % — ABNORMAL HIGH (ref <5.7)
Mean Plasma Glucose: 134 mg/dL — ABNORMAL HIGH (ref <117)

## 2012-02-28 LAB — GLUCOSE, CAPILLARY
Glucose-Capillary: 167 mg/dL — ABNORMAL HIGH (ref 70–99)
Glucose-Capillary: 197 mg/dL — ABNORMAL HIGH (ref 70–99)

## 2012-02-28 MED ORDER — ACETAMINOPHEN 10 MG/ML IV SOLN
1000.0000 mg | Freq: Four times a day (QID) | INTRAVENOUS | Status: AC
  Administered 2012-02-28 – 2012-02-29 (×4): 1000 mg via INTRAVENOUS
  Filled 2012-02-28 (×4): qty 100

## 2012-02-28 MED ORDER — HYDROMORPHONE HCL 2 MG PO TABS
2.0000 mg | ORAL_TABLET | ORAL | Status: DC | PRN
  Filled 2012-02-28: qty 1

## 2012-02-28 MED ORDER — HYDROMORPHONE HCL 2 MG PO TABS
2.0000 mg | ORAL_TABLET | ORAL | Status: DC | PRN
  Administered 2012-02-28 – 2012-03-01 (×13): 4 mg via ORAL
  Filled 2012-02-28 (×2): qty 2
  Filled 2012-02-28: qty 1
  Filled 2012-02-28 (×10): qty 2

## 2012-02-28 MED ORDER — WARFARIN SODIUM 5 MG PO TABS
5.0000 mg | ORAL_TABLET | Freq: Once | ORAL | Status: AC
  Administered 2012-02-28: 5 mg via ORAL
  Filled 2012-02-28: qty 1

## 2012-02-28 NOTE — Progress Notes (Signed)
Patient ID: Andre Malone, male   DOB: [birth year], 61 y.o.   MRN: [id number] PATIENT ID: [name]        MRN:  [id number]          DOB/AGE: 05-01-1951 / 61 y.o.  Andre Campbell, MD   Jacqualine Code, PA-C [address] [place], [place], [place]  [address]                             [phone]   PROGRESS NOTE  Subjective:  negative for Chest Pain  negative for Shortness of Breath  negative for Nausea/Vomiting   negative for Calf Pain  negative for Bowel Movement   Tolerating Diet: yes         Patient reports pain as moderate.     C/o pain in thigh.  Denies numbness/tingling.  Objective: Vital signs in last 24 hours:   Patient Vitals for the past 24 hrs:  BP Temp Temp src Pulse Resp SpO2 Height Weight  02/28/12 0613 133/68 mmHg 97.4 F (36.3 C) - 105  20  98 % - -  02/28/12 0256 118/58 mmHg 98.2 F (36.8 C) - 112  19  93 % - -  02/27/12 2245 113/69 mmHg 98.9 F (37.2 C) - 96  20  98 % - -  02/27/12 1700 - - - - - - 5' 6.14" (1.68 m) 98 kg (216 lb 0.8 oz)  02/27/12 1600 - 97.4 F (36.3 C) - 86  21  95 % - -  02/27/12 1556 121/58 mmHg - - - - - - -  02/27/12 1545 - - - 89  21  96 % - -  02/27/12 1542 103/60 mmHg - - - - - - -  02/27/12 1530 - - - 88  16  97 % - -  02/27/12 1526 120/53 mmHg - - - - - - -  02/27/12 1515 - - - 89  18  96 % - -  02/27/12 1511 114/60 mmHg - - - - - - -  02/27/12 1500 - - - - - 97 % - -  02/27/12 1456 114/57 mmHg - - 82  21  94 % - -  02/27/12 1441 115/66 mmHg - - 82  16  99 % - -  02/27/12 1426 123/83 mmHg - - 84  15  97 % - -  02/27/12 1412 125/56 mmHg - - 83  17  97 % - -  02/27/12 1407 - - - 84  19  95 % - -  02/27/12 1400 - - - 80  12  97 % - -  02/27/12 1356 130/57 mmHg - - - - - - -  02/27/12 1345 - - - 83  17  97 % - -  02/27/12 1341 128/60 mmHg - - - - - - -  02/27/12 1330 - - - 94  20  98 % - -  02/27/12 1322 135/62 mmHg 97.5 F (36.4 C) - 90  18  - - -  02/27/12 0836 113/68 mmHg 97.9 F (36.6 C) Oral 79  18  95 % -  -      Intake/Output from previous day:   06/25 0701 - 06/26 0700 In: 4335 [P.O.:360; I.V.:3475] Out: 1725 [Urine:1225]   Intake/Output this shift:       Intake/Output      06/25 0701 - 06/26 0700 06/26 0701 - 06/27 0700  P.O. 360    I.V. (mL/kg) 3475 (35.5)    IV Piggyback 500    Total Intake(mL/kg) 4335 (44.2)    Urine (mL/kg/hr) 1225 (0.5)    Blood 500    Total Output 1725    Net +2610            LABORATORY DATA:  Basename 02/28/12 0520 02/21/12 1100  WBC 10.9* 11.6*  HGB 9.6* 14.6  HCT 28.4* 42.9  PLT 159 199    Basename 02/28/12 0520 02/21/12 1100  NA 133* 138  K 4.2 4.6  CL 100 103  CO2 22 26  BUN 17 19  CREATININE 1.39* 1.41*  GLUCOSE 181* 97  CALCIUM 8.1* 9.7   Lab Results  Component Value Date   INR 1.36 02/28/2012   INR 1.07 02/27/2012   INR 1.71* 02/21/2012    Examination:  General appearance: alert, cooperative, moderate distress and moderately obese Resp: clear to auscultation bilaterally Cardio: regular rate and rhythm and systolic murmur: GI: normal findings: bowel sounds normal  Wound Exam: clean, dry, intact   Drainage:  None: wound tissue dry  Motor Exam: EHL, FHL, Anterior Tibial and Posterior Tibial Intact  Sensory Exam: Superficial Peroneal, Deep Peroneal and Tibial normal  Vascular Exam: Left dorsalis pedis artery has 1+ (weak) pulse  Assessment:    1 Day Post-Op  Procedure(s) (LRB): TOTAL HIP ARTHROPLASTY (Left)  ADDITIONAL DIAGNOSIS:  Active Problems:  * No active hospital problems. *   Acute Blood Loss Anemia and Diabetes   Plan: Physical Therapy as ordered Weight Bearing as Tolerated (WBAT)  DVT Prophylaxis:  Lovenox, Coumadin, Foot Pumps and TED hose  DISCHARGE PLAN: Home  DISCHARGE NEEDS: HHPT, HHRN, Walker and 3-in-1 comode seat  Will change to Dilaudid for pain mgmt         [name] 02/28/2012, 8:18 AM

## 2012-02-28 NOTE — Evaluation (Signed)
Physical Therapy Evaluation Patient Details Name: Andre Malone MRN: [id number] DOB: 08/21/1951 Today's Date: 02/28/2012 Time: 4098-1191 PT Time Calculation (min): 26 min  PT Assessment / Plan / Recommendation Clinical Impression  pt presents s/p L THA.  pt very painful and moving slow.  pt should make good progress to D/C to home with wife and HHPT.      PT Assessment  Patient needs continued PT services    Follow Up Recommendations  Home health PT;Supervision - Intermittent    Barriers to Discharge None      Equipment Recommendations  3 in 1 bedside comode    Recommendations for Other Services     Frequency 7X/week    Precautions / Restrictions Precautions Precautions: Posterior Hip Precaution Booklet Issued: Yes (comment) Restrictions Weight Bearing Restrictions: Yes LLE Weight Bearing: Weight bearing as tolerated   Pertinent Vitals/Pain 9/10 L hip. Pt premedicated.        Mobility  Bed Mobility Bed Mobility: Not assessed Supine to Sit: 4: Min assist;With rails;HOB elevated Details for Bed Mobility Assistance: VCs for hand placement, sequencing and technique. A needed for R LE. Transfers Transfers: Sit to Stand;Stand to Sit Sit to Stand: 4: Min assist;With upper extremity assist;With armrests;From chair/3-in-1 Stand to Sit: 4: Min assist;With upper extremity assist;With armrests;To chair/3-in-1 Details for Transfer Assistance: cues for use of UEs and positioning of LEs.   Ambulation/Gait Ambulation/Gait Assistance: 4: Min guard Ambulation Distance (Feet): 25 Feet Assistive device: Rolling walker Ambulation/Gait Assistance Details: cues for sequencing, upright posture, use of RW.   Gait Pattern: Step-to pattern;Decreased step length - right;Decreased stance time - left;Trunk flexed Stairs: No Wheelchair Mobility Wheelchair Mobility: No    Exercises Total Joint Exercises Ankle Circles/Pumps: AROM;Both;10 reps Quad Sets: AROM;Both;10 reps   PT Diagnosis:  Abnormality of gait;Acute pain  PT Problem List: Decreased strength;Decreased activity tolerance;Decreased balance;Decreased mobility;Decreased knowledge of use of DME;Decreased knowledge of precautions;Pain PT Treatment Interventions: DME instruction;Gait training;Stair training;Functional mobility training;Therapeutic activities;Therapeutic exercise;Balance training;Patient/family education   PT Goals Acute Rehab PT Goals PT Goal Formulation: With patient Time For Goal Achievement: 03/06/12 Potential to Achieve Goals: Good Pt will go Supine/Side to Sit: with modified independence PT Goal: Supine/Side to Sit - Progress: Goal set today Pt will go Sit to Supine/Side: with modified independence PT Goal: Sit to Supine/Side - Progress: Goal set today Pt will go Sit to Stand: with modified independence PT Goal: Sit to Stand - Progress: Goal set today Pt will go Stand to Sit: with modified independence PT Goal: Stand to Sit - Progress: Goal set today Pt will Ambulate: >150 feet;with modified independence;with rolling walker PT Goal: Ambulate - Progress: Goal set today Pt will Go Up / Down Stairs: 3-5 stairs;with min assist;with least restrictive assistive device PT Goal: Up/Down Stairs - Progress: Goal set today Pt will Perform Home Exercise Program: Independently PT Goal: Perform Home Exercise Program - Progress: Goal set today Additional Goals Additional Goal #1: pt will verbalize and follow posterior hip precautions.   PT Goal: Additional Goal #1 - Progress: Goal set today  Visit Information  Last PT Received On: 02/28/12 Assistance Needed: +1    Subjective Data  Subjective: My wife used to work on [employer].   Patient Stated Goal: Home   Prior Functioning  Home Living Lives With: Spouse Available Help at Discharge: Family;Available 24 hours/day (Pts wife was RN on 5000) Type of Home: House Home Access: Stairs to enter [employer] of Steps: 3 Entrance Stairs-Rails:  Right Home Layout: Two  level;Able to live on main level with bedroom/bathroom Bathroom Shower/Tub: Health visitor: Standard Home Adaptive Equipment: Other (comment);Built-in shower seat (unsure if std walker or RW.) Prior Function Level of Independence: Independent with assistive device(s) Driving: Yes Vocation: Retired Musician: No difficulties    Cognition  Overall Cognitive Status: Appears within functional limits for tasks assessed/performed Arousal/Alertness: Awake/alert Orientation Level: Appears intact for tasks assessed Behavior During Session: [facility] for tasks performed    Extremity/Trunk Assessment Right Upper Extremity Assessment RUE ROM/Strength/Tone: [facility] for tasks assessed Left Upper Extremity Assessment LUE ROM/Strength/Tone: WFL for tasks assessed Right Lower Extremity Assessment RLE ROM/Strength/Tone: WFL for tasks assessed RLE Sensation: WFL - Light Touch Left Lower Extremity Assessment LLE ROM/Strength/Tone: Deficits LLE ROM/Strength/Tone Deficits: AAROM WFL.  Limited by pain LLE Sensation: WFL - Light Touch   Balance Balance Balance Assessed: No  End of Session PT - End of Session Equipment Utilized During Treatment: Gait belt Activity Tolerance: Patient limited by pain Patient left: in chair;with call bell/phone within reach Nurse Communication: Mobility status  GP     Sunny Schlein, [place] [phone] 02/28/2012, 1:21 PM

## 2012-02-28 NOTE — Progress Notes (Signed)
Anticoag: s/p THR. 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 Has been off coumadin since 6/19 and has been bridged with lovenox 100 mg bid. MD has reordered the lovenox. Home dose of coumadin is 5 mg daily. INR up to 1.36 today. CBC down from previously probably due to surgery. Goal INR: 2-3  Plan: Will give coumadin 5 mg today. Daily INR. MD has ordered CBC daily x 3.

## 2012-02-28 NOTE — Progress Notes (Signed)
Referral received for SNF. Chart reviewed and CSW has spoken with RNCM who indicates that patient is for DC to home with Home Health and DME.  CSW to sign off. Please re-consult if CSW needs arise.  Keeghan Mcintire T. Sabrine Patchen, LCSWA  [phone]  

## 2012-02-28 NOTE — Evaluation (Signed)
Occupational Therapy Evaluation Patient Details Name: Andre Malone MRN: [id number] DOB: 1950/09/17 Today's Date: 02/28/2012 Time: 4098-1191 OT Time Calculation (min): 35 min  OT Assessment / Plan / Recommendation Clinical Impression  Pt presents POD 1 LTHR. Skilled OT recommended to maximize I w/BADLs to supervision level in prep for d/c home with HHOT.    OT Assessment  Patient needs continued OT Services    Follow Up Recommendations  Home health OT    Barriers to Discharge      Equipment Recommendations  3 in 1 bedside comode    Recommendations for Other Services    Frequency  Min 2X/week    Precautions / Restrictions Precautions Precautions: Posterior Hip Restrictions Weight Bearing Restrictions: No   Pertinent Vitals/Pain Reported 8/10 pain. Pt premedicated and RN made aware.    ADL  Grooming: Performed;Wash/dry face;Set up Where Assessed - Grooming: Unsupported sitting Upper Body Bathing: Simulated;Set up Where Assessed - Upper Body Bathing: Unsupported sitting Lower Body Bathing: Simulated;Moderate assistance Where Assessed - Lower Body Bathing: Supported sit to stand Upper Body Dressing: Performed;Set up Where Assessed - Upper Body Dressing: Unsupported sitting Lower Body Dressing: Simulated;Maximal assistance Where Assessed - Lower Body Dressing: Supported sit to stand Toilet Transfer: Simulated;Minimal assistance Toilet Transfer Method: Sit to Barista: Other (comment) Nurse, children's) Toileting - Clothing Manipulation and Hygiene: Simulated;Minimal assistance Where Assessed - Engineer, mining and Hygiene: Standing Equipment Used: Rolling walker;Gait belt Transfers/Ambulation Related to ADLs: Pt limited by fatigue and pain. Only able to take 4 steps to recliner.    OT Diagnosis: Generalized weakness  OT Problem List: Decreased activity tolerance;Decreased knowledge of use of DME or AE;Decreased knowledge of  precautions;Pain OT Treatment Interventions: Self-care/ADL training;Therapeutic activities;DME and/or AE instruction;Patient/family education   OT Goals Acute Rehab OT Goals OT Goal Formulation: With patient Time For Goal Achievement: 03/06/12 Potential to Achieve Goals: Good ADL Goals Pt Will Perform Grooming: with supervision;Standing at sink ADL Goal: Grooming - Progress: Goal set today Pt Will Perform Lower Body Bathing: with supervision;Sit to stand from chair;Sit to stand from bed;with adaptive equipment ADL Goal: Lower Body Bathing - Progress: Goal set today Pt Will Perform Lower Body Dressing: with supervision;Sit to stand from bed;Sit to stand from chair;with adaptive equipment ADL Goal: Lower Body Dressing - Progress: Goal set today Pt Will Transfer to Toilet: with supervision;3-in-1;Maintaining hip precautions;Comfort height toilet;Ambulation ADL Goal: Toilet Transfer - Progress: Goal set today Pt Will Perform Toileting - Clothing Manipulation: with supervision;Standing ADL Goal: Toileting - Clothing Manipulation - Progress: Goal set today Pt Will Perform Toileting - Hygiene: with supervision;Sit to stand from 3-in-1/toilet ADL Goal: Toileting - Hygiene - Progress: Goal set today Pt Will Perform Tub/Shower Transfer: with supervision;Shower transfer;Ambulation;Maintaining hip precautions ADL Goal: Tub/Shower Transfer - Progress: Goal set today  Visit Information  Last OT Received On: 02/28/12 Assistance Needed: +1    Subjective Data  Subjective: Oh it hurts so bad. Patient Stated Goal: Return home with assistance from wife.   Prior Functioning  Home Living Lives With: Spouse Available Help at Discharge: Family;Available 24 hours/day (Pts wife was RN on 5000) Type of Home: House Home Access: Stairs to enter [employer] of Steps: 3 Entrance Stairs-Rails: Right Home Layout: Two level;Able to live on main level with bedroom/bathroom Bathroom Shower/Tub:  Health visitor: Standard Home Adaptive Equipment: Other (comment);Built-in shower seat (unsure if std walker or RW.) Prior Function Level of Independence: Independent with assistive device(s) Driving: Yes Vocation: Retired Musician: No difficulties  Cognition  Overall Cognitive Status: Appears within functional limits for tasks assessed/performed Arousal/Alertness: Awake/alert Orientation Level: Appears intact for tasks assessed Behavior During Session: [facility] for tasks performed    Extremity/Trunk Assessment Right Upper Extremity Assessment RUE ROM/Strength/Tone: [facility] for tasks assessed Left Upper Extremity Assessment LUE ROM/Strength/Tone: WFL for tasks assessed   Mobility Bed Mobility Bed Mobility: Supine to Sit Supine to Sit: 4: Min assist;With rails;HOB elevated Details for Bed Mobility Assistance: VCs for hand placement, sequencing and technique. A needed for R LE. Transfers Transfers: Sit to Stand;Stand to Sit Sit to Stand: 4: Min assist;With upper extremity assist;With armrests;From bed Stand to Sit: 4: Min assist;With armrests;With upper extremity assist;To chair/3-in-1 Details for Transfer Assistance: VCs for hand placement, LE position and step sequence.   Exercise    Balance    End of Session OT - End of Session Equipment Utilized During Treatment: Gait belt Activity Tolerance: Patient limited by fatigue;Patient limited by pain Patient left: in chair;with call bell/phone within reach  GO     Mercia Dowe A OTR/L [phone] 02/28/2012, 9:57 AM

## 2012-02-28 NOTE — Progress Notes (Signed)
UR COMPLETED  

## 2012-02-29 ENCOUNTER — Encounter (HOSPITAL_COMMUNITY): Payer: Self-pay | Admitting: Orthopaedic Surgery

## 2012-02-29 DIAGNOSIS — M8700 Idiopathic aseptic necrosis of unspecified bone: Secondary | ICD-10-CM | POA: Diagnosis present

## 2012-02-29 DIAGNOSIS — D62 Acute posthemorrhagic anemia: Secondary | ICD-10-CM | POA: Diagnosis not present

## 2012-02-29 DIAGNOSIS — E871 Hypo-osmolality and hyponatremia: Secondary | ICD-10-CM | POA: Diagnosis not present

## 2012-02-29 LAB — BASIC METABOLIC PANEL
Calcium: 8.2 mg/dL — ABNORMAL LOW (ref 8.4–10.5)
GFR calc non Af Amer: 68 mL/min — ABNORMAL LOW (ref >90)
Glucose, Bld: 185 mg/dL — ABNORMAL HIGH (ref 70–99)
Sodium: 133 mEq/L — ABNORMAL LOW (ref 135–145)

## 2012-02-29 LAB — CBC
HCT: 25.8 % — ABNORMAL LOW (ref 39.0–52.0)
Hemoglobin: 8.6 g/dL — ABNORMAL LOW (ref 13.0–17.0)
MCH: 30.1 pg (ref 26.0–34.0)
MCHC: 34.7 g/dL (ref 30.0–36.0)
MCV: 86.3 fL (ref 78.0–100.0)
RBC: 2.99 MIL/uL — ABNORMAL LOW (ref 4.22–5.81)
RDW: 14.5 % (ref 11.5–15.5)
WBC: 11.8 10*3/uL — ABNORMAL HIGH (ref 4.0–10.5)

## 2012-02-29 LAB — PROTIME-INR: INR: 2.91 — ABNORMAL HIGH (ref 0.00–1.49)

## 2012-02-29 LAB — GLUCOSE, CAPILLARY: Glucose-Capillary: 182 mg/dL — ABNORMAL HIGH (ref 70–99)

## 2012-02-29 MED ORDER — ENOXAPARIN SODIUM 100 MG/ML ~~LOC~~ SOLN: 100.0000 mg | Freq: Two times a day (BID) | SUBCUTANEOUS | Status: DC

## 2012-02-29 MED ORDER — INSULIN GLARGINE 100 UNIT/ML ~~LOC~~ SOLN
55.0000 [IU] | Freq: Every day | SUBCUTANEOUS | Status: DC
  Administered 2012-03-01: 55 [IU] via SUBCUTANEOUS

## 2012-02-29 MED ORDER — WARFARIN SODIUM 5 MG PO TABS: ORAL_TABLET | ORAL | Status: DC

## 2012-02-29 MED ORDER — HYDROMORPHONE HCL 2 MG PO TABS: 2.0000 mg | ORAL_TABLET | ORAL | Status: AC | PRN

## 2012-02-29 MED ORDER — METHOCARBAMOL 500 MG PO TABS: 500.0000 mg | ORAL_TABLET | Freq: Four times a day (QID) | ORAL | Status: AC | PRN

## 2012-02-29 MED ORDER — ALPRAZOLAM 0.25 MG PO TABS
0.2500 mg | ORAL_TABLET | Freq: Two times a day (BID) | ORAL | Status: DC | PRN
  Administered 2012-02-29: 0.25 mg via ORAL
  Filled 2012-02-29: qty 1

## 2012-02-29 MED ORDER — INSULIN ASPART 100 UNIT/ML ~~LOC~~ SOLN
0.0000 [IU] | Freq: Three times a day (TID) | SUBCUTANEOUS | Status: DC
  Administered 2012-02-29: 5 [IU] via SUBCUTANEOUS
  Administered 2012-03-01: 2 [IU] via SUBCUTANEOUS
  Administered 2012-03-01 (×2): 3 [IU] via SUBCUTANEOUS

## 2012-02-29 MED ORDER — WARFARIN SODIUM 1 MG PO TABS
1.0000 mg | ORAL_TABLET | Freq: Once | ORAL | Status: AC
  Administered 2012-02-29: 1 mg via ORAL
  Filled 2012-02-29: qty 1

## 2012-02-29 NOTE — Progress Notes (Signed)
PT Cancel Note:   PM session cancelled due to pt awaiting blood transfusion & very tired.    [name], [place] [phone] 02/29/2012

## 2012-02-29 NOTE — Progress Notes (Signed)
Glendale Chard, OTR/L Pager: [phone] 02/29/2012

## 2012-02-29 NOTE — Progress Notes (Signed)
Patient ID: KASHMERE STAFFA, male   DOB: 1951/04/18, 61 y.o.   MRN: [id number] PATIENT ID: SOPHIE QUILES        MRN:  [id number]          DOB/AGE: 1950-12-28 / 61 y.o.  [name], MD   Jacqualine Code, PA-C [address] [place], [place], [place]  [address]                             [phone]   PROGRESS NOTE  Subjective:  negative for Chest Pain  negative for Shortness of Breath  negative for Nausea/Vomiting   negative for Calf Pain  positive for Bowel Movement small amounts   Tolerating Diet: yes         Patient reports pain as 4 on 0-10 scale.     Problems sleeping.  Did not take his trazadone last night.  Dilaudid controlling his pain.  C/o of anxiety.  Objective: Vital signs in last 24 hours:   Patient Vitals for the past 24 hrs:  BP Temp Pulse Resp SpO2  02/29/12 0449 125/64 mmHg 98.5 F (36.9 C) 100  18  98 %  02/29/12 0400 - - - 18  94 %  02/29/12 0000 - - - 18  94 %  02/28/12 2039 110/61 mmHg 99.1 F (37.3 C) 109  16  92 %  02/28/12 2000 - - - 18  94 %      Intake/Output from previous day:   06/26 0701 - 06/27 0700 In: 240 [P.O.:240] Out: 600 [Urine:600]   Intake/Output this shift:       Intake/Output      06/26 0701 - 06/27 0700 06/27 0701 - 06/28 0700   P.O. 240    I.V. (mL/kg)     IV Piggyback     Total Intake(mL/kg) 240 (2.4)    Urine (mL/kg/hr) 600 (0.3)    Blood     Total Output 600    Net -360            LABORATORY DATA:  Basename 02/29/12 0520 02/28/12 0520  WBC 10.3 10.9*  HGB 8.6* 9.6*  HCT 24.8* 28.4*  PLT 133* 159    Basename 02/29/12 0520 02/28/12 0520  NA 133* 133*  K 3.9 4.2  CL 100 100  CO2 24 22  BUN 15 17  CREATININE 1.14 1.39*  GLUCOSE 185* 181*  CALCIUM 8.2* 8.1*   Lab Results  Component Value Date   INR 2.91* 02/29/2012   INR 1.36 02/28/2012   INR 1.07 02/27/2012    Examination:  General appearance: alert, cooperative, mild distress and moderate distress Resp: clear to auscultation  bilaterally Cardio: regular rate and rhythm with associated murmur  GI: normal findings: bowel sounds normal  Wound Exam: clean, dry, intact   Drainage:  None: wound tissue dry  Motor Exam: EHL, FHL, Anterior Tibial and Posterior Tibial Intact  Sensory Exam: Superficial Peroneal, Deep Peroneal and Tibial normal  Vascular Exam: Left posterior tibial artery has 1+ (weak) pulse  Assessment:    2 Days Post-Op  Procedure(s) (LRB): TOTAL HIP ARTHROPLASTY (Left)  ADDITIONAL DIAGNOSIS:  Active Problems:  * No active hospital problems. *   Acute Blood Loss Anemia, Hyponatremia and Diabetes   Plan: Physical Therapy as ordered Weight Bearing as Tolerated (WBAT)  DVT Prophylaxis:  Lovenox, Coumadin, Foot Pumps and TED hose  DISCHARGE PLAN: Home  DISCHARGE NEEDS: HHPT, Walker and 3-in-1 comode seat  Discussed need to take trazadone to sleep.  Will see how he progresses.  Will plan on d/c tomorrow. Will give 1 unit of PRBC's today since he has dyspnea when OOB and elevated pulse.        Franki Alcaide 02/29/2012, 1:18 PM

## 2012-02-29 NOTE — Progress Notes (Addendum)
CARE MANAGEMENT NOTE 02/29/2012  Patient:  Andre Malone, Andre Malone   Account Number:  [id number]  Date Initiated:  02/28/2012  Documentation initiated by:  Vance Peper  Subjective/Objective Assessment:   61 yr old male s/p left total hip arthroplasty     Action/Plan:   CM spoke with patient and wife regarding home health needs at discharge. Choice ofered. patient's wfe states she is an orthopedic RN and will provide therapy for her husband at discharge. They have borrowed a 3in1 and rolling walker.   Anticipated DC Date:  02/29/2012   Anticipated DC Plan:  HOME/SELF CARE      DC Planning Services  CM consult      PAC Choice  DURABLE MEDICAL EQUIPMENT   Choice offered to / List presented to:        DME agency  [facility]        Status of service:  Completed, signed off Medicare Important Message given?   (If response is "NO", the following Medicare IM given date fields will be blank) Date Medicare IM given:   Date Additional Medicare IM given:    Discharge Disposition:  HOME/SELF CARE  Per UR Regulation:    If discussed at Long Length of Stay Meetings, dates discussed:    Comments:   Patient's wife states they will need a 3in1, not able to borrow one. Ordered through [facility], will give wife mapquest directions to go and pick it up.

## 2012-02-29 NOTE — Progress Notes (Signed)
61 yr old male admitted for THR.   Anticoag: s/p THR. 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 Has been off coumadin since 6/19 and has been bridged with lovenox 100 mg bid. MD has reordered the lovenox. Home dose of coumadin is 5 mg daily. INR jumped to 2.91 today. H/H and Plt down Goal INR: 2-3  Plan: Coumadin 1 mg po x1 today. Daily INR.  D/c lovenox since INR > 2
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
## 2012-02-29 NOTE — Progress Notes (Signed)
Occupational Therapy Treatment Patient Details Name: Andre Malone MRN: [id number] DOB: 01/29/1951 Today's Date: 02/29/2012 Time: 4098-1191 OT Time Calculation (min): 16 min  OT Assessment / Plan / Recommendation Comments on Treatment Session Education on shower transfer and AE. However, session limited secondary to pt's refusal of out of chair activity.    Follow Up Recommendations  Home health OT ((pt is declining this due to lack of funds))    Barriers to Discharge       Equipment Recommendations  3 in 1 bedside comode    Recommendations for Other Services    Frequency     Plan Discharge plan remains appropriate    Precautions / Restrictions Precautions Precautions: Posterior Hip Precaution Comments: Pt able to verbalize 2/3 posterior hip precautions; educated on 3/3  Restrictions LLE Weight Bearing: Weight bearing as tolerated   Pertinent Vitals/Pain Pt reports left hip pain- did not rate. Pre-medicated    ADL  Transfers/Ambulation Related to ADLs: NP this session secondary to pt refusal ADL Comments: Educated pt and wife on backward shower entry using teach back method. Pt declining practice due to fatigue and pain. Demonstrated AE to pt with wife in room. Pt expressed interest in reacher, therefore explained where to purchase if desired. Pt and wife state they will not be able to afford HHOT or PT. D/w pt and wife need for 3n1; pt originally declined 3n1, but is not requesting it. RN informed.     OT Diagnosis:    OT Problem List:   OT Treatment Interventions:     OT Goals ADL Goals ADL Goal: Lower Body Bathing - Progress: Progressing toward goals ADL Goal: Lower Body Dressing - Progress: Progressing toward goals ADL Goal: Toilet Transfer - Progress: Progressing toward goals ADL Goal: Tub/Shower Transfer - Progress: Progressing toward goals  Visit Information  Last OT Received On: 02/29/12 Assistance Needed: +1    Subjective Data      Prior Functioning      Cognition  Overall Cognitive Status: Appears within functional limits for tasks assessed/performed Arousal/Alertness: Awake/alert Orientation Level: Appears intact for tasks assessed Behavior During Session: [facility] for tasks performed    Mobility   Exercises   Balance Balance Balance Assessed: No  End of Session OT - End of Session Activity Tolerance: Patient limited by fatigue;Patient limited by pain Patient left: with call bell/phone within reach;with family/visitor present;in chair  GO     Andre Malone 02/29/2012, 2:07 PM

## 2012-02-29 NOTE — Progress Notes (Addendum)
PT Progress Note:     02/29/12 1300  PT Visit Information  Last PT Received On 02/29/12  Assistance Needed +1  PT Time Calculation  PT Start Time 1044  PT Stop Time 1112  PT Time Calculation (min) 28 min  Precautions  Precautions Posterior Hip  Restrictions  LLE Weight Bearing WBAT  Cognition  Overall Cognitive Status Appears within functional limits for tasks assessed/performed  Arousal/Alertness Awake/alert  Orientation Level Appears intact for tasks assessed  Behavior During Session [facility] for tasks performed  Bed Mobility  Bed Mobility Supine to Sit  Supine to Sit 4: Min assist;HOB flat  Details for Bed Mobility Assistance Cues for technique.  (A) for LLE.   Transfers  Transfers Sit to Stand;Stand to Sit  Sit to Stand 4: Min guard;With upper extremity assist;From bed  Stand to Sit With upper extremity assist;With armrests;To chair/3-in-1;4: Min assist  Details for Transfer Assistance Cues for safe hand placement, & LLE placement.  (A) to control descent  Ambulation/Gait  Ambulation/Gait Assistance 4: Min guard  Ambulation Distance (Feet) 35 Feet  Assistive device Rolling walker  Ambulation/Gait Assistance Details Cues for sequencing, RW advancement, tall posture, increase floor clearance.  Distance limited by pt c/o fatigue, increased pain, LE weakness.   Pt's wife followed behind with recliner.  Pt transported back to room in recliner.   Pt with DOE.  Cues for proper breathing.    Gait Pattern Step-to pattern;Decreased stance time - left;Decreased step length - left;Decreased step length - right;Decreased weight shift to left;Decreased hip/knee flexion - left;Decreased hip/knee flexion - right;Antalgic  Stairs No  Engineering geologist No  Balance  Balance Assessed No  Exercises  Exercises Total Joint  Total Joint Exercises  Ankle Circles/Pumps AROM;Both;10 reps  Quad Sets AROM;Both;10 reps  Gluteal Sets AROM;Both;10 reps  Heel Slides AAROM;Left;10  reps  Hip ABduction/ADduction AAROM;Left;10 reps  PT - End of Session  Equipment Utilized During Treatment Gait belt  Activity Tolerance Patient limited by pain  Patient left in chair;with call bell/phone within reach;with family/visitor present  Nurse Communication Mobility status;Other (comment) (pain)  PT - Assessment/Plan  Comments on Treatment Session Pt progressing slowly with PT goals. Pain seems to be limiting factor per pt.  When speaking to pt & wife Re: HHPT, pt's wife reports she will be assisting pt in therapy due to finances & will not be getting HHPT services.     PT Plan Discharge plan remains appropriate  PT Frequency 7X/week  Follow Up Recommendations Home health PT;Supervision - Intermittent  Acute Rehab PT Goals  Time For Goal Achievement 03/06/12  Potential to Achieve Goals Good  PT Goal: Supine/Side to Sit - Progress Progressing toward goal  PT Goal: Sit to Stand - Progress Progressing toward goal  PT Goal: Stand to Sit - Progress Not met  PT Goal: Ambulate - Progress Progressing toward goal  PT Goal: Perform Home Exercise Program - Progress Progressing toward goal    Pain:  9/10  Hip.  Repositioned & RN made aware.    Andre Malone, [place] [phone] 02/29/2012

## 2012-03-01 LAB — CBC
Hemoglobin: 8.6 g/dL — ABNORMAL LOW (ref 13.0–17.0)
MCH: 30.4 pg (ref 26.0–34.0)
MCHC: 35.5 g/dL (ref 30.0–36.0)
Platelets: 142 10*3/uL — ABNORMAL LOW (ref 150–400)
RDW: 14.7 % (ref 11.5–15.5)

## 2012-03-01 LAB — GLUCOSE, CAPILLARY
Glucose-Capillary: 131 mg/dL — ABNORMAL HIGH (ref 70–99)
Glucose-Capillary: 160 mg/dL — ABNORMAL HIGH (ref 70–99)
Glucose-Capillary: 168 mg/dL — ABNORMAL HIGH (ref 70–99)

## 2012-03-01 LAB — BASIC METABOLIC PANEL
Calcium: 8.2 mg/dL — ABNORMAL LOW (ref 8.4–10.5)
GFR calc non Af Amer: 71 mL/min — ABNORMAL LOW (ref >90)
Glucose, Bld: 149 mg/dL — ABNORMAL HIGH (ref 70–99)
Sodium: 134 mEq/L — ABNORMAL LOW (ref 135–145)

## 2012-03-01 LAB — TYPE AND SCREEN
ABO/RH(D): A POS
Antibody Screen: NEGATIVE

## 2012-03-01 LAB — PROTIME-INR: INR: 2.07 — ABNORMAL HIGH (ref 0.00–1.49)

## 2012-03-01 MED ORDER — WARFARIN SODIUM 5 MG PO TABS
5.0000 mg | ORAL_TABLET | Freq: Once | ORAL | Status: AC
  Administered 2012-03-01: 5 mg via ORAL
  Filled 2012-03-01: qty 1

## 2012-03-01 MED ORDER — WARFARIN SODIUM 5 MG PO TABS: ORAL_TABLET | ORAL | Status: DC

## 2012-03-01 NOTE — Progress Notes (Signed)
CARE MANAGEMENT NOTE 03/01/2012    Anticipated DC Date:  03/01/2012   Anticipated DC Plan:  HOME W HOME HEALTH SERVICES      DC Planning Services  CM consult      PAC Choice  DURABLE MEDICAL EQUIPMENT  HOME HEALTH   Choice offered to / List presented to:  C-1 Patient      DME agency  [facility]     HH arranged  HH-1 RN      [facility] agency  [facility]   Status of service:  Completed, signed off  Discharge Disposition:  HOME W HOME HEALTH SERVICES  Comments:  03/01/12 8:24 AM Vance Peper, RN BSN Case Manager

## 2012-03-01 NOTE — Progress Notes (Signed)
ANTICOAGULATION CONSULT NOTE - Follow Up Consult  Pharmacy Consult for Coumadin Indication: VTE prophylaxis/AVR  Allergies  Allergen Reactions  . Ace Inhibitors Other (See Comments)    cough  . Benadryl (Diphenhydramine Hcl) Other (See Comments)    Makes restless legs worse  . Diphenhydramine Hcl Other (See Comments)    Makes restless legs worse  . Metoprolol Other (See Comments)    Kidney failure  . Morphine And Related Other (See Comments)    Makes restless legs worse  . Nsaids Other (See Comments)    unknown  . Valsartan Other (See Comments)    REACTION:shuts down Kidney    Patient Measurements: Height: 5' 6.14" (168 cm) Weight: 216 lb 0.8 oz (98 kg) IBW/kg (Calculated) : 64.13  Heparin Dosing Weight:   Vital Signs: Temp: 98.4 F (36.9 C) (06/28 0528) Temp src: Oral (06/28 0528) BP: 115/67 mmHg (06/28 0528) Pulse Rate: 109  (06/28 0528)  Labs:  Basename 03/01/12 0500 02/29/12 1852 02/29/12 0520 02/28/12 0520  HGB 8.6* 9.2* -- --  HCT 24.2* 25.8* 24.8* --  PLT 142* 145* 133* --  APTT -- -- -- --  LABPROT 23.7* -- 30.9* 17.0*  INR 2.07* -- 2.91* 1.36  HEPARINUNFRC -- -- -- --  CREATININE 1.10 -- 1.14 1.39*  CKTOTAL -- -- -- --  CKMB -- -- -- --  TROPONINI -- -- -- --    Estimated Creatinine Clearance: 78.5 ml/min (by C-G formula based on Cr of 1.1).   Medications:   Scheduled:    . acetaminophen  1,000 mg Intravenous Q6H  . docusate sodium  100 mg Oral BID  . fenofibrate  160 mg Oral q1800  . folic acid  1.5 mg Oral Daily  . insulin aspart  0-15 Units Subcutaneous TID WC  . insulin aspart  0-5 Units Subcutaneous QHS  . insulin glargine  55 Units Subcutaneous QHS  . irbesartan  150 mg Oral Daily  . levothyroxine  88 mcg Oral QAC breakfast  . sertraline  100 mg Oral Daily  . traZODone  100 mg Oral QHS  . warfarin  1 mg Oral ONCE-1800  . Warfarin - Pharmacist Dosing Inpatient   Does not apply q1800  . DISCONTD: insulin aspart  0-15 Units  Subcutaneous TID WC    Assessment: 60 YOM s/p L THA, he is on chronic warfarin tx for h/o AVR.  INR = 2.07 (INR improved from largejump 6/26 to 6/27). Home dose is 5mg  daily.  Hgb=8.6 and Plate;ets = 045 (Both down a bit from yday)  Goal of Therapy:  INR 2-3 Monitor platelets by anticoagulation protocol: Yes   Plan:  1. INR improved, resume coumadin 5mg  daily.  2. Follow INR, CBC, signs of bleeding  Dannielle Huh 03/01/2012,9:30 AM

## 2012-03-01 NOTE — Progress Notes (Signed)
Patient ID: Andre Malone, male   DOB: 03/27/1951, 61 y.o.   MRN: [id number] PATIENT ID: Andre Malone        MRN:  [id number]          DOB/AGE: February 16, 1951 / 61 y.o.  Andre Campbell, MD   Andre Code, PA-C [address] [place], [place], [place]  [address]                             [phone]   PROGRESS NOTE  Subjective:  negative for Chest Pain  negative for Shortness of Breath  negative for Nausea/Vomiting   negative for Calf Pain  positive for Bowel Movement small amounts with flatulence   Tolerating Diet: yes         Patient reports pain as mild.     Pain and anxiety better.  Desires to go home today.  Objective: Vital signs in last 24 hours:   Patient Vitals for the past 24 hrs:  BP Temp Temp src Pulse Resp SpO2  03/01/12 0528 115/67 mmHg 98.4 F (36.9 C) Oral 109  20  94 %  03/01/12 0400 - - - - 18  94 %  03/01/12 0000 - - - - 18  94 %  02/29/12 2200 126/63 mmHg 99.2 F (37.3 C) Oral 98  20  94 %  02/29/12 2000 - - - - 18  94 %  02/29/12 1640 120/52 mmHg 98.4 F (36.9 C) Oral 94  18  -  02/29/12 1540 106/49 mmHg 98.5 F (36.9 C) Oral 92  18  -  02/29/12 1510 115/51 mmHg 99.3 F (37.4 C) Oral 88  18  95 %  02/29/12 1445 112/55 mmHg 97.9 F (36.6 C) Oral 92  20  -  02/29/12 1325 96/56 mmHg 98.3 F (36.8 C) Oral 94  18  97 %      Intake/Output from previous day:   06/27 0701 - 06/28 0700 In: 250 [I.V.:250] Out: 650 [Urine:650]   Intake/Output this shift:       Intake/Output      06/27 0701 - 06/28 0700 06/28 0701 - 06/29 0700   P.O.     I.V. (mL/kg) 250 (2.6)    Total Intake(mL/kg) 250 (2.6)    Urine (mL/kg/hr) 650 (0.3)    Total Output 650    Net -400            LABORATORY DATA:  Basename 03/01/12 0500 02/29/12 1852 02/29/12 0520 02/28/12 0520  WBC 10.6* 11.8* 10.3 10.9*  HGB 8.6* 9.2* 8.6* 9.6*  HCT 24.2* 25.8* 24.8* 28.4*  PLT 142* 145* 133* 159    Basename 03/01/12 0500 02/29/12 0520 02/28/12 0520  NA 134* 133* 133*  K 3.5  3.9 4.2  CL 100 100 100  CO2 25 24 22   BUN 15 15 17   CREATININE 1.10 1.14 1.39*  GLUCOSE 149* 185* 181*  CALCIUM 8.2* 8.2* 8.1*   Lab Results  Component Value Date   INR 2.07* 03/01/2012   INR 2.91* 02/29/2012   INR 1.36 02/28/2012    Examination:  General appearance: alert, cooperative, mild distress and moderately obese Resp: clear to auscultation bilaterally Cardio: regular rate and rhythm rare ectopic GI: normal findings: bowel sounds normal  Wound Exam: clean, dry, intact dressing changed  Drainage:  Scant/small amount Serosanguinous exudate  Motor Exam: EHL, FHL, Anterior Tibial and Posterior Tibial Intact  Sensory Exam: Superficial Peroneal, Deep Peroneal and Tibial  normal  Vascular Exam: Left dorsalis pedis artery has trace pulse  Assessment:    3 Days Post-Op  Procedure(s) (LRB): TOTAL HIP ARTHROPLASTY (Left)  ADDITIONAL DIAGNOSIS:  Principal Problem:  *Avascular necrosis of bone Active Problems:  Acute blood loss anemia  Hyponatremia  Acute Blood Loss Anemia and Hyponatremia   Plan: Physical Therapy as ordered Weight Bearing as Tolerated (WBAT)  DVT Prophylaxis:  Coumadin and TED hose  DISCHARGE PLAN: Home today  DISCHARGE NEEDS: HHRN, Walker and 3-in-1 comode seat refuses PT because of lack of insurance coverage         Andre Malone 03/01/2012, 8:44 AM

## 2012-03-01 NOTE — Discharge Instructions (Signed)
Home Health to be provided by [facility]  [phone]

## 2012-03-01 NOTE — Progress Notes (Signed)
CARE MANAGEMENT NOTE 03/01/2012  Comments:  03/01/12 1400 Vance Peper, RN BSN Received call that [facility] will not have a nurse available for saturday. CM contacted [facility], and they are able to staff with RN and PT. Information faxed to [facility] @ [phone]. Will inform patient.

## 2012-03-01 NOTE — Progress Notes (Signed)
PT Progress Note:     03/01/12 1300  PT Visit Information  Last PT Received On 03/01/12  Assistance Needed +1  PT Time Calculation  PT Start Time 1004  PT Stop Time 1044  PT Time Calculation (min) 40 min  Subjective Data  Subjective "Im going home tomorrow"  Precautions  Precautions Posterior Hip  Precaution Comments Pt able to verbalize all 3 hip precautions  Restrictions  LLE Weight Bearing WBAT  Cognition  Overall Cognitive Status Appears within functional limits for tasks assessed/performed  Arousal/Alertness Awake/alert  Orientation Level Appears intact for tasks assessed  Behavior During Session [facility] for tasks performed  Bed Mobility  Bed Mobility Supine to Sit;Sitting - Scoot to Edge of Bed  Supine to Sit 4: Min assist;HOB flat  Sitting - Scoot to [place] of Bed 4: Min guard  Details for Bed Mobility Assistance (A) for LLE.  Cues for technique.  Increased time to complete.    Transfers  Transfers Sit to Stand;Stand to Sit  Sit to Stand 4: Min guard;With upper extremity assist;From bed;From chair/3-in-1  Stand to Sit 4: Min guard;With upper extremity assist;With armrests;To chair/3-in-1  Details for Transfer Assistance cues for hand placement & use of UE's to control descent  Ambulation/Gait  Ambulation/Gait Assistance 4: Min guard  Ambulation Distance (Feet) 80 Feet  Assistive device Rolling walker  Ambulation/Gait Assistance Details cues for sequencing, encouragement to decrease reliance of UE's on RW.  Pt c/o fatigue quickly.    Gait Pattern Step-to pattern;Decreased hip/knee flexion - left;Decreased step length - right;Decreased step length - left  Stairs No  Wheelchair Mobility  Wheelchair Mobility No  Balance  Balance Assessed No  PT - End of Session  Equipment Utilized During Treatment Gait belt  Activity Tolerance Patient limited by fatigue  Patient left in chair;with call bell/phone within reach  PT - Assessment/Plan  Comments on Treatment Session Pt  fatigues quickly.  Increased ambulation distance but required max encouragement to continue.  Pt reports he will be getting HHPT now.    PT Plan Discharge plan remains appropriate  PT Frequency 7X/week  Follow Up Recommendations Home health PT;Supervision - Intermittent  Equipment Recommended 3 in 1 bedside comode  Acute Rehab PT Goals  Time For Goal Achievement 03/06/12  Potential to Achieve Goals Good  PT Goal: Supine/Side to Sit - Progress Progressing toward goal  PT Goal: Sit to Stand - Progress Progressing toward goal  PT Goal: Stand to Sit - Progress Progressing toward goal  PT Goal: Ambulate - Progress Progressing toward goal    Pain:  7/10 hip.  RN notified & administered pain meds.    [name], [place] [phone] 03/01/2012

## 2012-03-01 NOTE — Progress Notes (Signed)
Occupational Therapy Treatment Patient Details Name: Andre Malone MRN: [id number] DOB: 1951-06-24 Today's Date: 03/01/2012 Time: 6578-4696 OT Time Calculation (min): 24 min  OT Assessment / Plan / Recommendation Comments on Treatment Session Pt progressing with therapy but continues to be easily fatigued. Pt states he is to d/c today    Follow Up Recommendations  Home health OT;Supervision/Assistance - 24 hour    Barriers to Discharge       Equipment Recommendations  3 in 1 bedside comode    Recommendations for Other Services    Frequency     Plan Discharge plan remains appropriate    Precautions / Restrictions Precautions Precautions: Posterior Hip Precaution Comments: Pt able to verbalize all 3 hip precautions Restrictions LLE Weight Bearing: Weight bearing as tolerated   Pertinent Vitals/Pain Pt reports 6/10 left hip pain. premedicated    ADL  Grooming: Performed;Wash/dry hands;Supervision/safety Where Assessed - Grooming: Unsupported standing Lower Body Bathing: Simulated;Minimal assistance Where Assessed - Lower Body Bathing: Unsupported standing Toilet Transfer: Performed;Min guard Toilet Transfer Method: Sit to Barista: Materials engineer and Hygiene: Performed;Min guard Where Assessed - Engineer, mining and Hygiene: Standing Tub/Shower Transfer: Performed;Minimal assistance Tub/Shower Transfer Method: Ambulating (backward entry; assist to lift RW over shower lip) Tub/Shower Transfer Equipment: Walk in shower Equipment Used: Rolling walker;Gait belt Transfers/Ambulation Related to ADLs: Min guard/ S with RW ambulation; pt with slow gait speed and easily fatigued ADL Comments: Pt able to I'ly verbalize and demonstrate backward shower entry method; assist required to lift RW over shower lip. Pt Also able to verbalize where to purchase AE kit if desired.    OT Diagnosis:    OT Problem List:    OT Treatment Interventions:     OT Goals ADL Goals ADL Goal: Grooming - Progress: Met ADL Goal: Lower Body Bathing - Progress: Progressing toward goals ADL Goal: Toilet Transfer - Progress: Progressing toward goals ADL Goal: Toileting - Clothing Manipulation - Progress: Met ADL Goal: Toileting - Hygiene - Progress: Progressing toward goals ADL Goal: Tub/Shower Transfer - Progress: Progressing toward goals  Visit Information  Last OT Received On: 03/01/12 Assistance Needed: +1    Subjective Data      Prior Functioning       Cognition  Overall Cognitive Status: Appears within functional limits for tasks assessed/performed Arousal/Alertness: Awake/alert Orientation Level: Appears intact for tasks assessed Behavior During Session: [facility] for tasks performed    Mobility Bed Mobility Bed Mobility: Supine to Sit;Sit to Supine Supine to Sit: 4: Min assist;HOB flat Sitting - Scoot to Edge of Bed: 4: Min guard Sit to Supine: 4: Min assist;HOB flat Details for Bed Mobility Assistance: (A) for LLE.  Cues for technique & safety Transfers Sit to Stand: 4: Min guard;With upper extremity assist;From bed;With armrests;From chair/3-in-1 Stand to Sit: 4: Min guard;With upper extremity assist;With armrests;To bed;To chair/3-in-1 Details for Transfer Assistance: Cont'd to require cues for hand placement & safe technique.  Slow to transition.    Exercises    Balance Balance Balance Assessed: No  End of Session OT - End of Session Equipment Utilized During Treatment: Gait belt Activity Tolerance: Patient tolerated treatment well Patient left:  (in gym with PT)  GO     Andre Malone 03/01/2012, 2:37 PM

## 2012-03-01 NOTE — Progress Notes (Signed)
PT Progress Note:     03/01/12 1428  PT Visit Information  Last PT Received On 03/01/12  Assistance Needed +1  PT Time Calculation  PT Start Time 1348  PT Stop Time 1420  PT Time Calculation (min) 32 min  Precautions  Precautions Posterior Hip  Restrictions  LLE Weight Bearing WBAT  Cognition  Overall Cognitive Status Appears within functional limits for tasks assessed/performed  Arousal/Alertness Awake/alert  Orientation Level Appears intact for tasks assessed  Behavior During Session [facility] for tasks performed  Bed Mobility  Bed Mobility Supine to Sit;Sit to Supine  Supine to Sit 4: Min assist;HOB flat  Sit to Supine 4: Min assist;HOB flat  Details for Bed Mobility Assistance (A) for LLE.  Cues for technique & safety  Transfers  Transfers Sit to Stand;Stand to Sit  Sit to Stand 4: Min guard;With upper extremity assist;From bed;With armrests;From chair/3-in-1  Stand to Sit 4: Min guard;With upper extremity assist;With armrests;To bed;To chair/3-in-1  Details for Transfer Assistance Cont'd to require cues for hand placement & safe technique.  Slow to transition.   Ambulation/Gait  Ambulation/Gait Assistance 4: Min guard  Ambulation Distance (Feet) 60 Feet  Assistive device Rolling walker  Ambulation/Gait Assistance Details Cues for sequencing (backwards stepping away from toilet & up to steps).    Gait Pattern Step-to pattern;Decreased hip/knee flexion - left;Decreased step length - right;Decreased step length - left  Stairs Yes  Stairs Assistance 4: Min assist  Stairs Assistance Details (indicate cue type and reason) Cues for sequencing & technique.  (A) for balance & RW management.    Stair Management Technique No rails;Backwards;Step to pattern;With walker  Number of Stairs 4   Wheelchair Mobility  Wheelchair Mobility No  Balance  Balance Assessed No  PT - End of Session  Equipment Utilized During Treatment Gait belt  Activity Tolerance Patient limited by fatigue    Patient left in bed;with call bell/phone within reach  Nurse Communication Other (comment) (pt's request for pain meds)  PT - Assessment/Plan  Comments on Treatment Session Pt moves fairly well, just fatigues quickly & seems to have poor activity tolerance.  Initiated stair training this session- pt did well.   PT Plan Discharge plan remains appropriate  PT Frequency 7X/week  Follow Up Recommendations Home health PT;Supervision - Intermittent  Equipment Recommended 3 in 1 bedside comode  Acute Rehab PT Goals  Time For Goal Achievement 03/06/12  Potential to Achieve Goals Good  PT Goal: Supine/Side to Sit - Progress Not met  PT Goal: Sit to Supine/Side - Progress Progressing toward goal  PT Goal: Sit to Stand - Progress Not met  PT Goal: Stand to Sit - Progress Not met  PT Goal: Ambulate - Progress Not met  PT Goal: Up/Down Stairs - Progress Progressing toward goal     Pain:  8/10  Hip.  RN notified.    [name], [place] [phone] 03/01/2012

## 2012-03-21 ENCOUNTER — Telehealth: Payer: Self-pay | Admitting: Internal Medicine

## 2012-03-21 NOTE — Telephone Encounter (Signed)
03-21-18 lmm @ 1106 for pt to call to set up past due pacer check with klein or brooke/mt

## 2012-03-22 ENCOUNTER — Encounter (HOSPITAL_COMMUNITY): Payer: Self-pay | Admitting: *Deleted

## 2012-03-22 ENCOUNTER — Emergency Department (HOSPITAL_COMMUNITY)
Admission: EM | Admit: 2012-03-22 | Discharge: 2012-03-22 | Disposition: A | Discharge status: Home / Self Care | Payer: Medicare HMO | Attending: Emergency Medicine | Admitting: Emergency Medicine

## 2012-03-22 DIAGNOSIS — R609 Edema, unspecified: Secondary | ICD-10-CM

## 2012-03-22 DIAGNOSIS — R791 Abnormal coagulation profile: Secondary | ICD-10-CM

## 2012-03-22 DIAGNOSIS — Z886 Allergy status to analgesic agent status: Secondary | ICD-10-CM | POA: Insufficient documentation

## 2012-03-22 DIAGNOSIS — Z7901 Long term (current) use of anticoagulants: Secondary | ICD-10-CM | POA: Insufficient documentation

## 2012-03-22 DIAGNOSIS — I2510 Atherosclerotic heart disease of native coronary artery without angina pectoris: Secondary | ICD-10-CM | POA: Insufficient documentation

## 2012-03-22 DIAGNOSIS — G2581 Restless legs syndrome: Secondary | ICD-10-CM | POA: Insufficient documentation

## 2012-03-22 DIAGNOSIS — Z87442 Personal history of urinary calculi: Secondary | ICD-10-CM | POA: Insufficient documentation

## 2012-03-22 DIAGNOSIS — Z954 Presence of other heart-valve replacement: Secondary | ICD-10-CM | POA: Insufficient documentation

## 2012-03-22 DIAGNOSIS — E119 Type 2 diabetes mellitus without complications: Secondary | ICD-10-CM | POA: Insufficient documentation

## 2012-03-22 DIAGNOSIS — E039 Hypothyroidism, unspecified: Secondary | ICD-10-CM | POA: Insufficient documentation

## 2012-03-22 DIAGNOSIS — Z950 Presence of cardiac pacemaker: Secondary | ICD-10-CM | POA: Insufficient documentation

## 2012-03-22 DIAGNOSIS — M129 Arthropathy, unspecified: Secondary | ICD-10-CM | POA: Insufficient documentation

## 2012-03-22 HISTORY — DX: Restless legs syndrome: G25.81

## 2012-03-22 LAB — URINALYSIS, ROUTINE W REFLEX MICROSCOPIC
Glucose, UA: NEGATIVE mg/dL
Ketones, ur: NEGATIVE mg/dL
Leukocytes, UA: NEGATIVE
Nitrite: NEGATIVE
Urobilinogen, UA: 0.2 mg/dL (ref 0.0–1.0)

## 2012-03-22 LAB — BASIC METABOLIC PANEL
CO2: 25 mEq/L (ref 19–32)
Calcium: 9.5 mg/dL (ref 8.4–10.5)
Creatinine, Ser: 1.09 mg/dL (ref 0.50–1.35)
GFR calc Af Amer: 83 mL/min — ABNORMAL LOW (ref >90)
Sodium: 132 mEq/L — ABNORMAL LOW (ref 135–145)

## 2012-03-22 LAB — PROTIME-INR
INR: 1.84 — ABNORMAL HIGH (ref 0.00–1.49)
Prothrombin Time: 21.6 seconds — ABNORMAL HIGH (ref 11.6–15.2)

## 2012-03-22 LAB — URINE MICROSCOPIC-ADD ON

## 2012-03-22 MED ORDER — OXYCODONE-ACETAMINOPHEN 5-325 MG PO TABS: 1.0000 | ORAL_TABLET | ORAL | Status: DC | PRN

## 2012-03-22 MED ORDER — FUROSEMIDE 20 MG PO TABS: 20.0000 mg | ORAL_TABLET | Freq: Two times a day (BID) | ORAL | Status: DC

## 2012-03-22 MED ORDER — HYDROMORPHONE HCL PF 1 MG/ML IJ SOLN
2.0000 mg | Freq: Once | INTRAMUSCULAR | Status: AC
  Administered 2012-03-22: 2 mg via INTRAMUSCULAR
  Filled 2012-03-22: qty 2

## 2012-03-22 MED ORDER — OXYCODONE-ACETAMINOPHEN 5-325 MG PO TABS
1.0000 | ORAL_TABLET | Freq: Once | ORAL | Status: AC
  Administered 2012-03-22: 1 via ORAL
  Filled 2012-03-22: qty 1

## 2012-03-22 MED ORDER — ENOXAPARIN SODIUM 60 MG/0.6ML ~~LOC~~ SOLN
1.0000 mg/kg | Freq: Once | SUBCUTANEOUS | Status: AC
  Administered 2012-03-22: 90 mg via SUBCUTANEOUS
  Filled 2012-03-22: qty 0.3

## 2012-03-22 NOTE — ED Notes (Signed)
Pt expressed concerns about fear of DVT and wants an Ultrasound
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
## 2012-03-22 NOTE — ED Notes (Signed)
Swelling lt leg , Had total hip in June.

## 2012-03-22 NOTE — ED Notes (Signed)
Discharge instructions reviewed with pt, questions answered. Pt verbalized understanding.  

## 2012-03-22 NOTE — ED Notes (Signed)
Ultrasound scheduled for 0830 tomorrow morning

## 2012-03-23 ENCOUNTER — Ambulatory Visit (HOSPITAL_COMMUNITY)
Admit: 2012-03-23 | Discharge: 2012-03-23 | Disposition: A | Discharge status: Home / Self Care | Payer: Medicare HMO | Source: Ambulatory Visit | Attending: Emergency Medicine | Admitting: Emergency Medicine

## 2012-03-23 DIAGNOSIS — M79609 Pain in unspecified limb: Secondary | ICD-10-CM | POA: Insufficient documentation

## 2012-03-27 MED FILL — Oxycodone w/ Acetaminophen Tab 5-325 MG: ORAL | Qty: 6 | Status: AC

## 2012-03-31 ENCOUNTER — Emergency Department (HOSPITAL_COMMUNITY): Payer: Medicare HMO

## 2012-03-31 ENCOUNTER — Emergency Department (HOSPITAL_COMMUNITY)
Admission: EM | Admit: 2012-03-31 | Discharge: 2012-03-31 | Disposition: A | Discharge status: Home / Self Care | Payer: Medicare HMO | Attending: Emergency Medicine | Admitting: Emergency Medicine

## 2012-03-31 ENCOUNTER — Encounter (HOSPITAL_COMMUNITY): Payer: Self-pay | Admitting: Emergency Medicine

## 2012-03-31 DIAGNOSIS — M25559 Pain in unspecified hip: Secondary | ICD-10-CM | POA: Insufficient documentation

## 2012-03-31 DIAGNOSIS — Z954 Presence of other heart-valve replacement: Secondary | ICD-10-CM | POA: Insufficient documentation

## 2012-03-31 DIAGNOSIS — W010XXA Fall on same level from slipping, tripping and stumbling without subsequent striking against object, initial encounter: Secondary | ICD-10-CM | POA: Insufficient documentation

## 2012-03-31 DIAGNOSIS — E039 Hypothyroidism, unspecified: Secondary | ICD-10-CM | POA: Insufficient documentation

## 2012-03-31 DIAGNOSIS — Z950 Presence of cardiac pacemaker: Secondary | ICD-10-CM | POA: Insufficient documentation

## 2012-03-31 DIAGNOSIS — Z96649 Presence of unspecified artificial hip joint: Secondary | ICD-10-CM | POA: Insufficient documentation

## 2012-03-31 DIAGNOSIS — Z794 Long term (current) use of insulin: Secondary | ICD-10-CM | POA: Insufficient documentation

## 2012-03-31 DIAGNOSIS — I2510 Atherosclerotic heart disease of native coronary artery without angina pectoris: Secondary | ICD-10-CM | POA: Insufficient documentation

## 2012-03-31 DIAGNOSIS — T148XXA Other injury of unspecified body region, initial encounter: Secondary | ICD-10-CM

## 2012-03-31 DIAGNOSIS — IMO0002 Reserved for concepts with insufficient information to code with codable children: Secondary | ICD-10-CM | POA: Insufficient documentation

## 2012-03-31 DIAGNOSIS — W19XXXA Unspecified fall, initial encounter: Secondary | ICD-10-CM

## 2012-03-31 DIAGNOSIS — Z79899 Other long term (current) drug therapy: Secondary | ICD-10-CM | POA: Insufficient documentation

## 2012-03-31 DIAGNOSIS — S8000XA Contusion of unspecified knee, initial encounter: Secondary | ICD-10-CM | POA: Insufficient documentation

## 2012-03-31 DIAGNOSIS — E119 Type 2 diabetes mellitus without complications: Secondary | ICD-10-CM | POA: Insufficient documentation

## 2012-03-31 DIAGNOSIS — I10 Essential (primary) hypertension: Secondary | ICD-10-CM | POA: Insufficient documentation

## 2012-03-31 DIAGNOSIS — Z7901 Long term (current) use of anticoagulants: Secondary | ICD-10-CM | POA: Insufficient documentation

## 2012-03-31 MED ORDER — SODIUM CHLORIDE 0.9 % IV SOLN
Freq: Once | INTRAVENOUS | Status: AC
  Administered 2012-03-31: 05:00:00 via INTRAVENOUS

## 2012-03-31 MED ORDER — HYDROCODONE-ACETAMINOPHEN 5-325 MG PO TABS
2.0000 | ORAL_TABLET | Freq: Once | ORAL | Status: AC
  Administered 2012-03-31: 2 via ORAL
  Filled 2012-03-31: qty 2

## 2012-03-31 MED ORDER — HYDROCODONE-ACETAMINOPHEN 5-500 MG PO TABS: 1.0000 | ORAL_TABLET | Freq: Four times a day (QID) | ORAL | Status: AC | PRN

## 2012-03-31 MED ORDER — HYDROMORPHONE HCL PF 1 MG/ML IJ SOLN
1.0000 mg | Freq: Once | INTRAMUSCULAR | Status: AC
  Administered 2012-03-31: 1 mg via INTRAVENOUS
  Filled 2012-03-31: qty 1

## 2012-03-31 MED ORDER — ONDANSETRON HCL 4 MG/2ML IJ SOLN
4.0000 mg | Freq: Once | INTRAMUSCULAR | Status: AC
  Administered 2012-03-31: 4 mg via INTRAVENOUS
  Filled 2012-03-31: qty 2
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
## 2012-03-31 NOTE — ED Notes (Signed)
Pt states that he got out of bed at 0300 and fell on his knee which jammed his hip. Pt is s/p hip replacement on 02/27/12. No medications taken for the pain. Pt in obvious distress and unable to get comfortable.

## 2012-03-31 NOTE — ED Provider Notes (Signed)
Medical screening examination/treatment/procedure(s) were performed by non-physician practitioner and as supervising physician I was immediately available for consultation/collaboration.   Celene Kras, MD 03/31/12 [phone]

## 2012-06-28 ENCOUNTER — Telehealth: Payer: Self-pay | Admitting: Internal Medicine
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
## 2012-11-20 ENCOUNTER — Other Ambulatory Visit: Payer: Self-pay | Admitting: Pharmacist

## 2012-11-20 ENCOUNTER — Encounter: Payer: Self-pay | Admitting: *Deleted

## 2012-11-20 DIAGNOSIS — I480 Paroxysmal atrial fibrillation: Secondary | ICD-10-CM | POA: Insufficient documentation

## 2012-11-20 DIAGNOSIS — I4891 Unspecified atrial fibrillation: Secondary | ICD-10-CM

## 2012-11-28 ENCOUNTER — Ambulatory Visit (INDEPENDENT_AMBULATORY_CARE_PROVIDER_SITE_OTHER): Payer: Medicare Other | Admitting: Pharmacist

## 2012-11-28 DIAGNOSIS — E785 Hyperlipidemia, unspecified: Secondary | ICD-10-CM

## 2012-11-28 DIAGNOSIS — I4891 Unspecified atrial fibrillation: Secondary | ICD-10-CM

## 2012-11-28 LAB — POCT INR: INR: 1.7

## 2012-11-28 MED ORDER — TRAZODONE HCL 100 MG PO TABS: 100.0000 mg | ORAL_TABLET | Freq: Every day | ORAL | Status: DC

## 2012-11-28 MED ORDER — PITAVASTATIN CALCIUM 2 MG PO TABS: 2.0000 mg | ORAL_TABLET | Freq: Every day | ORAL | Status: DC

## 2012-11-28 NOTE — Addendum Note (Signed)
Addended by: Henrene Pastor on: 11/28/2012 04:52 PM   Modules accepted: Orders, Medications

## 2012-12-12 ENCOUNTER — Other Ambulatory Visit: Payer: Self-pay | Admitting: Nurse Practitioner

## 2012-12-13 ENCOUNTER — Other Ambulatory Visit: Payer: Self-pay | Admitting: *Deleted

## 2012-12-13 MED ORDER — LEVOTHYROXINE SODIUM 88 MCG PO TABS: 88.0000 ug | ORAL_TABLET | Freq: Every day | ORAL | Status: DC

## 2012-12-23 ENCOUNTER — Encounter: Payer: Self-pay | Admitting: Pharmacist

## 2012-12-23 ENCOUNTER — Ambulatory Visit (INDEPENDENT_AMBULATORY_CARE_PROVIDER_SITE_OTHER): Payer: Medicare Other | Admitting: Pharmacist

## 2012-12-23 DIAGNOSIS — I4891 Unspecified atrial fibrillation: Secondary | ICD-10-CM

## 2012-12-23 LAB — POCT INR: INR: 2.1

## 2012-12-23 NOTE — Patient Instructions (Signed)
Anticoagulation Dose Instructions as of 12/23/2012     Glynis Smiles Tue Wed Thu Fri Sat   New Dose 5 mg 7.5 mg 5 mg 5 mg 7.5 mg 5 mg 5 mg      INR was 2.1 today

## 2012-12-25 ENCOUNTER — Other Ambulatory Visit: Payer: Self-pay | Admitting: Family Medicine

## 2013-01-01 ENCOUNTER — Emergency Department (HOSPITAL_COMMUNITY)
Admission: EM | Admit: 2013-01-01 | Discharge: 2013-01-02 | Disposition: A | Discharge status: Home / Self Care | Payer: Medicare Other | Attending: Emergency Medicine | Admitting: Emergency Medicine

## 2013-01-01 ENCOUNTER — Encounter (HOSPITAL_COMMUNITY): Payer: Self-pay | Admitting: Family Medicine

## 2013-01-01 ENCOUNTER — Other Ambulatory Visit: Payer: Self-pay

## 2013-01-01 ENCOUNTER — Emergency Department (HOSPITAL_COMMUNITY): Payer: Medicare Other

## 2013-01-01 DIAGNOSIS — F3289 Other specified depressive episodes: Secondary | ICD-10-CM | POA: Insufficient documentation

## 2013-01-01 DIAGNOSIS — Z8669 Personal history of other diseases of the nervous system and sense organs: Secondary | ICD-10-CM | POA: Insufficient documentation

## 2013-01-01 DIAGNOSIS — F329 Major depressive disorder, single episode, unspecified: Secondary | ICD-10-CM | POA: Insufficient documentation

## 2013-01-01 DIAGNOSIS — Z8739 Personal history of other diseases of the musculoskeletal system and connective tissue: Secondary | ICD-10-CM | POA: Insufficient documentation

## 2013-01-01 DIAGNOSIS — N189 Chronic kidney disease, unspecified: Secondary | ICD-10-CM | POA: Insufficient documentation

## 2013-01-01 DIAGNOSIS — Z79899 Other long term (current) drug therapy: Secondary | ICD-10-CM | POA: Insufficient documentation

## 2013-01-01 DIAGNOSIS — R059 Cough, unspecified: Secondary | ICD-10-CM | POA: Insufficient documentation

## 2013-01-01 DIAGNOSIS — Z794 Long term (current) use of insulin: Secondary | ICD-10-CM | POA: Insufficient documentation

## 2013-01-01 DIAGNOSIS — F411 Generalized anxiety disorder: Secondary | ICD-10-CM | POA: Insufficient documentation

## 2013-01-01 DIAGNOSIS — E119 Type 2 diabetes mellitus without complications: Secondary | ICD-10-CM | POA: Insufficient documentation

## 2013-01-01 DIAGNOSIS — Z950 Presence of cardiac pacemaker: Secondary | ICD-10-CM | POA: Insufficient documentation

## 2013-01-01 DIAGNOSIS — I2510 Atherosclerotic heart disease of native coronary artery without angina pectoris: Secondary | ICD-10-CM | POA: Insufficient documentation

## 2013-01-01 DIAGNOSIS — Z7901 Long term (current) use of anticoagulants: Secondary | ICD-10-CM | POA: Insufficient documentation

## 2013-01-01 DIAGNOSIS — J189 Pneumonia, unspecified organism: Secondary | ICD-10-CM

## 2013-01-01 DIAGNOSIS — R509 Fever, unspecified: Secondary | ICD-10-CM | POA: Insufficient documentation

## 2013-01-01 DIAGNOSIS — R05 Cough: Secondary | ICD-10-CM | POA: Insufficient documentation

## 2013-01-01 DIAGNOSIS — E039 Hypothyroidism, unspecified: Secondary | ICD-10-CM | POA: Insufficient documentation

## 2013-01-01 DIAGNOSIS — I129 Hypertensive chronic kidney disease with stage 1 through stage 4 chronic kidney disease, or unspecified chronic kidney disease: Secondary | ICD-10-CM | POA: Insufficient documentation

## 2013-01-01 MED ORDER — ACETAMINOPHEN 325 MG PO TABS
650.0000 mg | ORAL_TABLET | Freq: Four times a day (QID) | ORAL | Status: DC | PRN
  Administered 2013-01-01: 650 mg via ORAL
  Filled 2013-01-01: qty 2

## 2013-01-01 NOTE — ED Notes (Signed)
Per EMS pt has been experiencing fever, body aches and a productive cough x3 days. States pt has pain on coughing and is coughing up brown/clear sputum. Per EMS pt has wheezes in bilateral lower bases. Pt given 5mg  Albuterol Neb tx PTA.

## 2013-01-02 ENCOUNTER — Telehealth (HOSPITAL_COMMUNITY): Payer: Self-pay | Admitting: Emergency Medicine

## 2013-01-02 LAB — CBC WITH DIFFERENTIAL/PLATELET
Lymphocytes Relative: 4 % — ABNORMAL LOW (ref 12–46)
Lymphs Abs: 0.5 10*3/uL — ABNORMAL LOW (ref 0.7–4.0)
Neutrophils Relative %: 90 % — ABNORMAL HIGH (ref 43–77)
Platelets: 148 10*3/uL — ABNORMAL LOW (ref 150–400)
RBC: 4.36 MIL/uL (ref 4.22–5.81)
WBC: 11.9 10*3/uL — ABNORMAL HIGH (ref 4.0–10.5)

## 2013-01-02 LAB — COMPREHENSIVE METABOLIC PANEL
ALT: 16 U/L (ref 0–53)
AST: 24 U/L (ref 0–37)
Alkaline Phosphatase: 67 U/L (ref 39–117)
CO2: 26 mEq/L (ref 19–32)
GFR calc Af Amer: 80 mL/min — ABNORMAL LOW (ref >90)
GFR calc non Af Amer: 69 mL/min — ABNORMAL LOW (ref >90)
Glucose, Bld: 138 mg/dL — ABNORMAL HIGH (ref 70–99)
Potassium: 4.3 mEq/L (ref 3.5–5.1)
Sodium: 139 mEq/L (ref 135–145)
Total Protein: 6.7 g/dL (ref 6.0–8.3)

## 2013-01-02 LAB — CG4 I-STAT (LACTIC ACID): Lactic Acid, Venous: 1.63 mmol/L (ref 0.5–2.2)

## 2013-01-02 LAB — LACTIC ACID, PLASMA: Lactic Acid, Venous: 1.5 mmol/L (ref 0.5–2.2)

## 2013-01-02 MED ORDER — AZITHROMYCIN 250 MG PO TABS: 250.0000 mg | ORAL_TABLET | Freq: Every day | ORAL | Status: DC

## 2013-01-02 MED ORDER — DEXTROSE 5 % IV SOLN
1.0000 g | Freq: Once | INTRAVENOUS | Status: DC
  Filled 2013-01-02: qty 10

## 2013-01-02 MED ORDER — AZITHROMYCIN 250 MG PO TABS
500.0000 mg | ORAL_TABLET | Freq: Once | ORAL | Status: AC
  Administered 2013-01-02: 500 mg via ORAL
  Filled 2013-01-02: qty 2
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
## 2013-01-02 NOTE — ED Notes (Signed)
Pt requesting temperature be retaken, states had drank a sip of water prior to previous PO Temp at 0100. Temp now 99.49F

## 2013-01-02 NOTE — ED Notes (Signed)
Marlon Pel PA reviewed chart and ordered medication change. Stop Azithromax and start Doxycycline 100mg  tabs. Take 100mg  every 12 hours for 10 days, disp 20 with no refills.

## 2013-01-02 NOTE — ED Notes (Signed)
Pt reminded of need for urine sample.  

## 2013-01-02 NOTE — ED Notes (Signed)
Rocephin infusion complete.

## 2013-01-02 NOTE — ED Notes (Signed)
Pt comfortable with d/c and f/u instrcutions. Prescriptions x1

## 2013-01-02 NOTE — ED Notes (Signed)
Per request from Dr. Judd Lien, pt ambulated in hallway with pulse ox. Pt DOE, SpO2 88%-91% during ambulation. Pt ambulated without difficulty, mentates well entire time, denies pain.

## 2013-01-08 LAB — CULTURE, BLOOD (ROUTINE X 2): Culture: NO GROWTH

## 2013-01-10 ENCOUNTER — Ambulatory Visit: Payer: Self-pay | Admitting: Nurse Practitioner

## 2013-01-17 ENCOUNTER — Encounter: Payer: Self-pay | Admitting: Internal Medicine

## 2013-01-22 ENCOUNTER — Ambulatory Visit (INDEPENDENT_AMBULATORY_CARE_PROVIDER_SITE_OTHER): Payer: Medicare Other | Admitting: Nurse Practitioner

## 2013-01-22 ENCOUNTER — Encounter: Payer: Self-pay | Admitting: Nurse Practitioner

## 2013-01-22 VITALS — BP 141/71 | HR 73 | Temp 98.9°F | Ht 66.5 in | Wt 224.0 lb

## 2013-01-22 DIAGNOSIS — E785 Hyperlipidemia, unspecified: Secondary | ICD-10-CM

## 2013-01-22 DIAGNOSIS — I10 Essential (primary) hypertension: Secondary | ICD-10-CM

## 2013-01-22 DIAGNOSIS — IMO0001 Reserved for inherently not codable concepts without codable children: Secondary | ICD-10-CM

## 2013-01-22 DIAGNOSIS — E109 Type 1 diabetes mellitus without complications: Secondary | ICD-10-CM

## 2013-01-22 DIAGNOSIS — I4891 Unspecified atrial fibrillation: Secondary | ICD-10-CM

## 2013-01-22 DIAGNOSIS — I359 Nonrheumatic aortic valve disorder, unspecified: Secondary | ICD-10-CM

## 2013-01-22 LAB — POCT INR: INR: 2.3

## 2013-01-22 NOTE — Progress Notes (Signed)
Subjective:    Patient ID: Andre Malone, male    DOB: Feb 19, 1951, 62 y.o.   MRN: [id number]  Hyperlipidemia This is a chronic problem. The current episode started more than 1 year ago. The problem is uncontrolled. Recent lipid tests were reviewed and are high. Exacerbating diseases include diabetes. There are no known factors aggravating his hyperlipidemia. Pertinent negatives include no chest pain, leg pain, myalgias or shortness of breath. Treatments tried: currently on no meds. Compliance problems include adherence to diet and adherence to exercise.  Risk factors for coronary artery disease include hypertension, diabetes mellitus and male sex.  Hypertension This is a chronic problem. The current episode started more than 1 year ago. The problem is unchanged. The problem is controlled. Associated symptoms include malaise/fatigue. Pertinent negatives include no chest pain, orthopnea, palpitations, peripheral edema, PND or shortness of breath. There are no associated agents to hypertension. Risk factors for coronary artery disease include diabetes mellitus, dyslipidemia, male gender and sedentary lifestyle. Past treatments include angiotensin blockers. The current treatment provides moderate improvement. Compliance problems include diet and exercise.  Hypertensive end-organ damage includes CAD/MI and a thyroid problem.  Diabetes He presents for his follow-up diabetic visit. He has type 2 diabetes mellitus. No MedicAlert identification noted. The initial diagnosis of diabetes was made 10 years ago. His disease course has been fluctuating. There are no hypoglycemic associated symptoms. Associated symptoms include fatigue and polydipsia (occcassional). Pertinent negatives for diabetes include no chest pain, no polyphagia, no polyuria, no visual change, no weakness and no weight loss. There are no hypoglycemic complications. Symptoms are stable. Diabetic complications include heart disease. Pertinent  negatives for diabetic complications include no peripheral neuropathy. Risk factors for coronary artery disease include dyslipidemia, hypertension, male sex and sedentary lifestyle. Current diabetic treatment includes oral agent (monotherapy) and insulin injections. He is compliant with treatment most of the time. He is currently taking insulin at bedtime. Insulin injections are given by patient. Rotation sites for injection include the abdominal wall. His weight is stable. When asked about meal planning, he reported none. He has not had a previous visit with a dietician. He never participates in exercise. His home blood glucose trend is fluctuating minimally. His breakfast blood glucose is taken between 8-9 am. His breakfast blood glucose range is generally 130-140 mg/dl. His overall blood glucose range is 130-140 mg/dl. An ACE inhibitor/angiotensin II receptor blocker is contraindicated. He does not see a podiatrist.Eye exam is current (over a year ago- patient told to make appointment. ).  Thyroid Problem Presents for follow-up visit. Symptoms include fatigue. Patient reports no diaphoresis, diarrhea, dry skin, leg swelling, palpitations, visual change or weight loss. The symptoms have been stable. His past medical history is significant for diabetes and hyperlipidemia.  Benign Prostatic Hypertrophy This is a chronic problem. The problem has been resolved since onset. Irritative symptoms include urgency. Irritative symptoms do not include nocturia. Obstructive symptoms do not include dribbling. Pertinent negatives include no hesitancy. AUA score is 0-7. He is sexually active. Nothing aggravates the symptoms. Past treatments include nothing. The treatment provided significant relief. He has been using treatment for 2 or more years.  depression Currently on zoloft- Doing well -no C/O side effects- Helps him to stay calm and from worrying all the time about silly stuff. RLS Mirapex nightly helps a  lot. insomnia trazadone to sleep- feels rested in AM- Not groggy throughout day . AOrtic Valve On coumadin- no bleeding   Review of Systems  Constitutional: Positive for malaise/fatigue and  fatigue. Negative for weight loss and diaphoresis.  Respiratory: Positive for cough (nonproductive.). Negative for shortness of breath.   Cardiovascular: Negative for chest pain, palpitations, orthopnea and PND.  Gastrointestinal: Negative for diarrhea.  Endocrine: Positive for polydipsia (occcassional). Negative for polyphagia and polyuria.  Genitourinary: Positive for urgency. Negative for hesitancy and nocturia.  Musculoskeletal: Negative for myalgias.  Neurological: Negative for weakness.       Objective:   Physical Exam  Constitutional: He is oriented to person, place, and time. He appears well-developed and well-nourished.  HENT:  Head: Normocephalic.  Right Ear: External ear normal.  Left Ear: External ear normal.  Nose: Nose normal.  Mouth/Throat: Oropharynx is clear and moist.  Eyes: EOM are normal. Pupils are equal, round, and reactive to light.  Neck: Normal range of motion. Neck supple. No thyromegaly present.  Cardiovascular: Normal rate, regular rhythm, normal heart sounds and intact distal pulses.   No murmur heard. Pulmonary/Chest: Effort normal and breath sounds normal. He has no wheezes. He has no rales.  Abdominal: Soft. Bowel sounds are normal.  Genitourinary: Prostate normal and penis normal.  Musculoskeletal: Normal range of motion.  Neurological: He is alert and oriented to person, place, and time.  Skin: Skin is warm and dry.  Psychiatric: He has a normal mood and affect. His behavior is normal. Judgment and thought content normal.   See diabetic foot exam BP 141/71  Pulse 73  Temp(Src) 98.9 F (37.2 C) (Oral)  Ht 5' 6.5" (1.689 m)  Wt 224 lb (101.606 kg)  BMI 35.62 kg/m2 Results for orders placed in visit on 01/22/13  POCT GLYCOSYLATED HEMOGLOBIN (HGB A1C)       Result Value Range   Hemoglobin A1C 6.2%    POCT INR      Result Value Range   INR 2.3           Assessment & Plan:  1. HTN (hypertension) Low NA+ diet - POCT glycosylated hemoglobin (Hb A1C) - COMPLETE METABOLIC PANEL WITH GFR - NMR Lipoprofile with Lipids - Vitamin D 25 hydroxy  2. Other and unspecified hyperlipidemia Low fat diet and exercise - POCT glycosylated hemoglobin (Hb A1C) - COMPLETE METABOLIC PANEL WITH GFR - NMR Lipoprofile with Lipids - Vitamin D 25 hydroxy  3. Diabetes mellitus, insulin dependent (IDDM), controlled Count carbs - POCT glycosylated hemoglobin (Hb A1C) - COMPLETE METABOLIC PANEL WITH GFR - NMR Lipoprofile with Lipids - Vitamin D 25 hydroxy  4. Aortic valve disorders See AVS fro coumadin dosing - INR  5. A-fib Continue coumadin as Rx Follow-up with cardiologist every 6 months.  [name] Daphine Deutscher, FNP

## 2013-01-22 NOTE — Patient Instructions (Addendum)
Anticoagulation Dose Instructions as of 01/22/2013     Andre Malone Tue Wed Thu Fri Sat   New Dose 5 mg 7.5 mg 5 mg 5 mg 7.5 mg 5 mg 5 mg    Description       Continue current dose

## 2013-01-23 LAB — COMPLETE METABOLIC PANEL WITH GFR
AST: 14 U/L (ref 0–37)
Albumin: 4.4 g/dL (ref 3.5–5.2)
BUN: 14 mg/dL (ref 6–23)
Calcium: 10.2 mg/dL (ref 8.4–10.5)
Chloride: 104 mEq/L (ref 96–112)
Potassium: 4.7 mEq/L (ref 3.5–5.3)
Sodium: 139 mEq/L (ref 135–145)
Total Protein: 7.2 g/dL (ref 6.0–8.3)

## 2013-01-23 LAB — VITAMIN D 25 HYDROXY (VIT D DEFICIENCY, FRACTURES): Vit D, 25-Hydroxy: 44 ng/mL (ref 30–89)

## 2013-01-24 LAB — NMR LIPOPROFILE WITH LIPIDS
Cholesterol, Total: 219 mg/dL — ABNORMAL HIGH (ref <200)
HDL Particle Number: 23 umol/L — ABNORMAL LOW (ref >=30.5)
LDL Particle Number: 2520 nmol/L — ABNORMAL HIGH (ref <1000)
Large HDL-P: 1.5 umol/L — ABNORMAL LOW (ref >=4.8)
Large VLDL-P: 3.5 nmol/L — ABNORMAL HIGH (ref <=2.7)
Small LDL Particle Number: 1629 nmol/L — ABNORMAL HIGH (ref <=527)
Triglycerides: 165 mg/dL — ABNORMAL HIGH (ref <150)
VLDL Size: 51 nm — ABNORMAL HIGH (ref <=46.6)

## 2013-01-28 ENCOUNTER — Other Ambulatory Visit: Payer: Self-pay | Admitting: Nurse Practitioner

## 2013-01-28 ENCOUNTER — Ambulatory Visit (INDEPENDENT_AMBULATORY_CARE_PROVIDER_SITE_OTHER): Payer: Medicare Other | Admitting: Pharmacist

## 2013-01-28 DIAGNOSIS — I4891 Unspecified atrial fibrillation: Secondary | ICD-10-CM

## 2013-01-28 MED ORDER — SIMVASTATIN 40 MG PO TABS: 40.0000 mg | ORAL_TABLET | Freq: Every day | ORAL | Status: DC

## 2013-01-28 MED ORDER — ATORVASTATIN CALCIUM 40 MG PO TABS: 40.0000 mg | ORAL_TABLET | Freq: Every day | ORAL | Status: DC

## 2013-01-28 NOTE — Patient Instructions (Signed)
Called to make follow up protime appt Appt made for 03/03/13 at 3:20pm

## 2013-01-31 ENCOUNTER — Emergency Department (HOSPITAL_COMMUNITY)
Admission: EM | Admit: 2013-01-31 | Discharge: 2013-01-31 | Disposition: A | Discharge status: Home / Self Care | Payer: Medicare Other | Attending: Emergency Medicine | Admitting: Emergency Medicine

## 2013-01-31 ENCOUNTER — Encounter (HOSPITAL_COMMUNITY): Payer: Self-pay | Admitting: *Deleted

## 2013-01-31 ENCOUNTER — Emergency Department (HOSPITAL_COMMUNITY): Payer: Medicare Other

## 2013-01-31 DIAGNOSIS — F3289 Other specified depressive episodes: Secondary | ICD-10-CM | POA: Insufficient documentation

## 2013-01-31 DIAGNOSIS — Z7901 Long term (current) use of anticoagulants: Secondary | ICD-10-CM | POA: Insufficient documentation

## 2013-01-31 DIAGNOSIS — I129 Hypertensive chronic kidney disease with stage 1 through stage 4 chronic kidney disease, or unspecified chronic kidney disease: Secondary | ICD-10-CM | POA: Insufficient documentation

## 2013-01-31 DIAGNOSIS — F411 Generalized anxiety disorder: Secondary | ICD-10-CM | POA: Insufficient documentation

## 2013-01-31 DIAGNOSIS — F329 Major depressive disorder, single episode, unspecified: Secondary | ICD-10-CM | POA: Insufficient documentation

## 2013-01-31 DIAGNOSIS — Z9861 Coronary angioplasty status: Secondary | ICD-10-CM | POA: Insufficient documentation

## 2013-01-31 DIAGNOSIS — E119 Type 2 diabetes mellitus without complications: Secondary | ICD-10-CM | POA: Insufficient documentation

## 2013-01-31 DIAGNOSIS — S40019A Contusion of unspecified shoulder, initial encounter: Secondary | ICD-10-CM | POA: Insufficient documentation

## 2013-01-31 DIAGNOSIS — Y92009 Unspecified place in unspecified non-institutional (private) residence as the place of occurrence of the external cause: Secondary | ICD-10-CM | POA: Insufficient documentation

## 2013-01-31 DIAGNOSIS — Y9389 Activity, other specified: Secondary | ICD-10-CM | POA: Insufficient documentation

## 2013-01-31 DIAGNOSIS — Z8679 Personal history of other diseases of the circulatory system: Secondary | ICD-10-CM | POA: Insufficient documentation

## 2013-01-31 DIAGNOSIS — W1809XA Striking against other object with subsequent fall, initial encounter: Secondary | ICD-10-CM | POA: Insufficient documentation

## 2013-01-31 DIAGNOSIS — Z79899 Other long term (current) drug therapy: Secondary | ICD-10-CM | POA: Insufficient documentation

## 2013-01-31 DIAGNOSIS — H5440 Blindness, one eye, unspecified eye: Secondary | ICD-10-CM | POA: Insufficient documentation

## 2013-01-31 DIAGNOSIS — N189 Chronic kidney disease, unspecified: Secondary | ICD-10-CM | POA: Insufficient documentation

## 2013-01-31 DIAGNOSIS — I2510 Atherosclerotic heart disease of native coronary artery without angina pectoris: Secondary | ICD-10-CM | POA: Insufficient documentation

## 2013-01-31 DIAGNOSIS — S40012A Contusion of left shoulder, initial encounter: Secondary | ICD-10-CM

## 2013-01-31 DIAGNOSIS — M129 Arthropathy, unspecified: Secondary | ICD-10-CM | POA: Insufficient documentation

## 2013-01-31 DIAGNOSIS — E039 Hypothyroidism, unspecified: Secondary | ICD-10-CM | POA: Insufficient documentation

## 2013-01-31 DIAGNOSIS — Z950 Presence of cardiac pacemaker: Secondary | ICD-10-CM | POA: Insufficient documentation

## 2013-01-31 DIAGNOSIS — Z794 Long term (current) use of insulin: Secondary | ICD-10-CM | POA: Insufficient documentation

## 2013-01-31 DIAGNOSIS — Z96649 Presence of unspecified artificial hip joint: Secondary | ICD-10-CM | POA: Insufficient documentation

## 2013-01-31 DIAGNOSIS — Z9889 Other specified postprocedural states: Secondary | ICD-10-CM | POA: Insufficient documentation

## 2013-01-31 MED ORDER — OXYCODONE-ACETAMINOPHEN 5-325 MG PO TABS: 1.0000 | ORAL_TABLET | Freq: Once | ORAL | Status: AC

## 2013-01-31 MED ORDER — OXYCODONE-ACETAMINOPHEN 5-325 MG PO TABS
ORAL_TABLET | ORAL | Status: AC
  Administered 2013-01-31: 1 via ORAL
  Filled 2013-01-31: qty 1

## 2013-01-31 MED ORDER — OXYCODONE-ACETAMINOPHEN 5-325 MG PO TABS: 2.0000 | ORAL_TABLET | ORAL | Status: DC | PRN

## 2013-01-31 MED ORDER — GI COCKTAIL ~~LOC~~
ORAL | Status: AC
  Filled 2013-01-31: qty 30

## 2013-01-31 MED ORDER — GI COCKTAIL ~~LOC~~
30.0000 mL | Freq: Once | ORAL | Status: AC
  Administered 2013-01-31: 30 mL via ORAL

## 2013-01-31 NOTE — ED Notes (Signed)
Tripped and fell onto shoulder when working in garden.  Pain lt shoulder and down his arm.  Increased pain with movement.

## 2013-01-31 NOTE — ED Notes (Signed)
Patient with no complaints at this time. Respirations even and unlabored. Skin warm/dry. Discharge instructions reviewed with patient at this time. Patient given opportunity to voice concerns/ask questions. I Patient discharged at this time and left Emergency Department with steady gait.   
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
## 2013-02-03 MED FILL — Oxycodone w/ Acetaminophen Tab 5-325 MG: ORAL | Qty: 6 | Status: AC

## 2013-02-10 ENCOUNTER — Other Ambulatory Visit: Payer: Self-pay | Admitting: *Deleted

## 2013-02-10 MED ORDER — LEVOTHYROXINE SODIUM 88 MCG PO TABS: 88.0000 ug | ORAL_TABLET | Freq: Every day | ORAL | Status: DC

## 2013-03-11 ENCOUNTER — Other Ambulatory Visit: Payer: Self-pay | Admitting: Family Medicine

## 2013-03-11 ENCOUNTER — Other Ambulatory Visit: Payer: Self-pay | Admitting: Orthopaedic Surgery

## 2013-03-11 DIAGNOSIS — M25512 Pain in left shoulder: Secondary | ICD-10-CM

## 2013-03-12 ENCOUNTER — Telehealth: Payer: Self-pay | Admitting: Pharmacist

## 2013-03-13 MED ORDER — ENOXAPARIN SODIUM 100 MG/ML ~~LOC~~ SOLN: 100.0000 mg | Freq: Two times a day (BID) | SUBCUTANEOUS | Status: DC

## 2013-03-13 NOTE — Telephone Encounter (Signed)
Last seen 11/28/12, last seen 01/22/13

## 2013-03-13 NOTE — Telephone Encounter (Signed)
See scanned instructions for Lovenox and warfarin prior to procedure planned for 03/26/13.  INstructions and rx for Lovenox left for patient to p/u at the front desk. Confirmed with nurse at [facility] that pt will only need to hold warfarin 4 days prior to procedure.  No need to hold metformin.

## 2013-03-20 ENCOUNTER — Other Ambulatory Visit: Payer: Medicare Other

## 2013-03-26 ENCOUNTER — Other Ambulatory Visit: Payer: Medicare Other

## 2013-03-26 ENCOUNTER — Ambulatory Visit
Admission: RE | Admit: 2013-03-26 | Discharge: 2013-03-26 | Disposition: A | Discharge status: Home / Self Care | Payer: Medicare Other | Source: Ambulatory Visit | Attending: Orthopaedic Surgery | Admitting: Orthopaedic Surgery

## 2013-03-26 ENCOUNTER — Ambulatory Visit (INDEPENDENT_AMBULATORY_CARE_PROVIDER_SITE_OTHER): Payer: Medicare Other | Admitting: Pharmacist

## 2013-03-26 DIAGNOSIS — M25512 Pain in left shoulder: Secondary | ICD-10-CM

## 2013-03-26 DIAGNOSIS — I4891 Unspecified atrial fibrillation: Secondary | ICD-10-CM

## 2013-03-26 LAB — POCT INR: INR: 1.2

## 2013-03-26 MED ORDER — IOHEXOL 180 MG/ML  SOLN: 15.0000 mL | Freq: Once | INTRAMUSCULAR | Status: AC | PRN

## 2013-03-26 NOTE — Patient Instructions (Signed)
Anticoagulation Dose Instructions as of 03/26/2013     Andre Malone Tue Wed Thu Fri Sat   New Dose 5 mg 7.5 mg 5 mg 5 mg 7.5 mg 5 mg 5 mg    Description       Continue to hold for procedure.  Restart as directed       INR was 1.2 today

## 2013-03-28 ENCOUNTER — Ambulatory Visit (INDEPENDENT_AMBULATORY_CARE_PROVIDER_SITE_OTHER): Payer: Medicare Other | Admitting: Family Medicine

## 2013-03-28 ENCOUNTER — Other Ambulatory Visit: Payer: Medicare Other

## 2013-03-28 VITALS — BP 150/80 | HR 76 | Temp 96.9°F | Ht 66.5 in | Wt 230.0 lb

## 2013-03-28 DIAGNOSIS — I4891 Unspecified atrial fibrillation: Secondary | ICD-10-CM

## 2013-03-28 LAB — POCT INR: INR: 2.1

## 2013-03-28 NOTE — Patient Instructions (Addendum)
Atrial Fibrillation Your caregiver has diagnosed you with atrial fibrillation (AFib). The heart normally beats very regularly; AFib is a type of irregular heartbeat. The heart rate may be faster or slower than normal. This can prevent your heart from pumping as well as it should. AFib can be constant (chronic) or intermittent (paroxysmal). CAUSES  Atrial fibrillation may be caused by:  Heart disease, including heart attack, coronary artery disease, heart failure, diseases of the heart valves, and others.  Blood clot in the lungs (pulmonary embolism).  Pneumonia or other infections.  Chronic lung disease.  Thyroid disease.  Toxins. These include alcohol, some medications (such as decongestant medications or diet pills), and caffeine. In some people, no cause for AFib can be found. This is referred to as Lone Atrial Fibrillation. SYMPTOMS   Palpitations or a fluttering in your chest.  A vague sense of chest discomfort.  Shortness of breath.  Sudden onset of lightheadedness or weakness. Sometimes, the first sign of AFib can be a complication of the condition. This could be a stroke or heart failure. DIAGNOSIS  Your description of your condition may make your caregiver suspicious of atrial fibrillation. Your caregiver will examine your pulse to determine if fibrillation is present. An EKG (electrocardiogram) will confirm the diagnosis. Further testing may help determine what caused you to have atrial fibrillation. This may include chest x-ray, echocardiogram, blood tests, or CT scans. PREVENTION  If you have previously had atrial fibrillation, your caregiver may advise you to avoid substances known to cause the condition (such as stimulant medications, and possibly caffeine or alcohol). You may be advised to use medications to prevent recurrence. Proper treatment of any underlying condition is important to help prevent recurrence. PROGNOSIS  Atrial fibrillation does tend to become a  chronic condition over time. It can cause significant complications (see below). Atrial fibrillation is not usually immediately life-threatening, but it can shorten your life expectancy. This seems to be worse in women. If you have lone atrial fibrillation and are under 60 years old, the risk of complications is very low, and life expectancy is not shortened. RISKS AND COMPLICATIONS  Complications of atrial fibrillation can include stroke, chest pain, and heart failure. Your caregiver will recommend treatments for the atrial fibrillation, as well as for any underlying conditions, to help minimize risk of complications. TREATMENT  Treatment for AFib is divided into several categories:  Treatment of any underlying condition.  Converting you out of AFib into a regular (sinus) rhythm.  Controlling rapid heart rate.  Prevention of blood clots and stroke. Medications and procedures are available to convert your atrial fibrillation to sinus rhythm. However, recent studies have shown that this may not offer you any advantage, and cardiac experts are continuing research and debate on this topic. More important is controlling your rapid heartbeat. The rapid heartbeat causes more symptoms, and places strain on your heart. Your caregiver will advise you on the use of medications that can control your heart rate. Atrial fibrillation is a strong stroke risk. You can lessen this risk by taking blood thinning medications such as Coumadin (warfarin), or sometimes aspirin. These medications need close monitoring by your caregiver. Over-medication can cause bleeding. Too little medication may not protect against stroke. HOME CARE INSTRUCTIONS   If your caregiver prescribed medicine to make your heartbeat more normally, take as directed.  If blood thinners were prescribed by your caregiver, take EXACTLY as directed.  Perform blood tests EXACTLY as directed.  Quit smoking. Smoking increases your cardiac and   lung  (pulmonary) risks.  DO NOT drink alcohol.  DO NOT drink caffeinated drinks (e.g. coffee, soda, chocolate, and leaf teas). You may drink decaffeinated coffee, soda or tea.  If you are overweight, you should choose a reduced calorie diet to lose weight. Please see a registered dietitian if you need more information about healthy weight loss. DO NOT USE DIET PILLS as they may aggravate heart problems.  If you have other heart problems that are causing AFib, you may need to eat a low salt, fat, and cholesterol diet. Your caregiver will tell you if this is necessary.  Exercise every day to improve your physical fitness. Stay active unless advised otherwise.  If your caregiver has given you a follow-up appointment, it is very important to keep that appointment. Not keeping the appointment could result in heart failure or stroke. If there is any problem keeping the appointment, you must call back to this facility for assistance. SEEK MEDICAL CARE IF:  You notice a change in the rate, rhythm or strength of your heartbeat.  You develop an infection or any other change in your overall health status. SEEK IMMEDIATE MEDICAL CARE IF:   You develop chest pain, abdominal pain, sweating, weakness or feel sick to your stomach (nausea).  You develop shortness of breath.  You develop swollen feet and ankles.  You develop dizziness, numbness, or weakness of your face or limbs, or any change in vision or speech. MAKE SURE YOU:   Understand these instructions.  Will watch your condition.  Will get help right away if you are not doing well or get worse. Document Released: 08/21/2005 Document Revised: 11/13/2011 Document Reviewed: 03/30/2010 Exit[employer] Patient Information 2014 [employer].  

## 2013-03-28 NOTE — Progress Notes (Signed)
  Subjective:    Patient ID: RESHAUN BRISENO, male    DOB: 23-Apr-1951, 62 y.o.   MRN: [id number]  HPI This 62 y.o. male presents for evaluation of pro-time.  Her PTINR is 2.1. She is taking warfarin for atrial fibrillation.  He denies any bleeding.   Review of Systems. No chest pain, SOB, HA, dizziness, vision change, N/V, diarrhea, constipation, dysuria, urinary urgency or frequency, myalgias, arthralgias or rash.     Objective:   Physical Exam  Vital signs noted  Well developed well nourished male.  HEENT - Head atraumatic Normocephalic                Eyes - PERRLA, Conjuctiva - clear Sclera- Clear EOM                Throat - oropharanx wnl Respiratory - Lungs CTA bilateral Neuro - Grossly intact.      Assessment & Plan:  A-fib Continue with current regimen and continue current anticoag regimen.

## 2013-04-10 ENCOUNTER — Telehealth: Payer: Self-pay | Admitting: Pharmacist

## 2013-04-10 NOTE — Telephone Encounter (Signed)
Patient asked about effects of prednisone on warfarin . He has been given a 7 day taper.  Prednisone can increase INR and advised pt to come in next week for recheck protime but he states that he cannon afford copay.  Instructed patient to take only 5mg  daily for next 7 days.  Call is any s/s of bleeding and to come in for recheck protime as soon as he is able.  Also warned that prednisone can increase BG and for patient to monitor more closely over next 2 weeks.

## 2013-04-14 ENCOUNTER — Ambulatory Visit: Payer: Medicare Other | Attending: Orthopaedic Surgery | Admitting: Physical Therapy

## 2013-04-14 DIAGNOSIS — IMO0001 Reserved for inherently not codable concepts without codable children: Secondary | ICD-10-CM | POA: Insufficient documentation

## 2013-04-14 DIAGNOSIS — M542 Cervicalgia: Secondary | ICD-10-CM | POA: Insufficient documentation

## 2013-04-14 DIAGNOSIS — M25519 Pain in unspecified shoulder: Secondary | ICD-10-CM | POA: Insufficient documentation

## 2013-04-14 DIAGNOSIS — R5381 Other malaise: Secondary | ICD-10-CM | POA: Insufficient documentation

## 2013-04-14 DIAGNOSIS — M546 Pain in thoracic spine: Secondary | ICD-10-CM | POA: Insufficient documentation

## 2013-04-18 ENCOUNTER — Ambulatory Visit: Payer: Medicare Other | Admitting: Physical Therapy

## 2013-04-19 ENCOUNTER — Emergency Department (HOSPITAL_COMMUNITY)
Admission: EM | Admit: 2013-04-19 | Discharge: 2013-04-19 | Disposition: A | Discharge status: Home / Self Care | Payer: Medicare Other | Attending: Emergency Medicine | Admitting: Emergency Medicine

## 2013-04-19 ENCOUNTER — Other Ambulatory Visit: Payer: Self-pay

## 2013-04-19 ENCOUNTER — Encounter (HOSPITAL_COMMUNITY): Payer: Self-pay | Admitting: *Deleted

## 2013-04-19 ENCOUNTER — Emergency Department (HOSPITAL_COMMUNITY): Payer: Medicare Other

## 2013-04-19 DIAGNOSIS — Z7901 Long term (current) use of anticoagulants: Secondary | ICD-10-CM | POA: Insufficient documentation

## 2013-04-19 DIAGNOSIS — Z8739 Personal history of other diseases of the musculoskeletal system and connective tissue: Secondary | ICD-10-CM | POA: Insufficient documentation

## 2013-04-19 DIAGNOSIS — I129 Hypertensive chronic kidney disease with stage 1 through stage 4 chronic kidney disease, or unspecified chronic kidney disease: Secondary | ICD-10-CM | POA: Insufficient documentation

## 2013-04-19 DIAGNOSIS — F3289 Other specified depressive episodes: Secondary | ICD-10-CM | POA: Insufficient documentation

## 2013-04-19 DIAGNOSIS — R112 Nausea with vomiting, unspecified: Secondary | ICD-10-CM | POA: Insufficient documentation

## 2013-04-19 DIAGNOSIS — N189 Chronic kidney disease, unspecified: Secondary | ICD-10-CM | POA: Insufficient documentation

## 2013-04-19 DIAGNOSIS — Z794 Long term (current) use of insulin: Secondary | ICD-10-CM | POA: Insufficient documentation

## 2013-04-19 DIAGNOSIS — Z87828 Personal history of other (healed) physical injury and trauma: Secondary | ICD-10-CM | POA: Insufficient documentation

## 2013-04-19 DIAGNOSIS — Z8679 Personal history of other diseases of the circulatory system: Secondary | ICD-10-CM | POA: Insufficient documentation

## 2013-04-19 DIAGNOSIS — R0609 Other forms of dyspnea: Secondary | ICD-10-CM | POA: Insufficient documentation

## 2013-04-19 DIAGNOSIS — G8929 Other chronic pain: Secondary | ICD-10-CM | POA: Insufficient documentation

## 2013-04-19 DIAGNOSIS — Z87442 Personal history of urinary calculi: Secondary | ICD-10-CM | POA: Insufficient documentation

## 2013-04-19 DIAGNOSIS — Z8669 Personal history of other diseases of the nervous system and sense organs: Secondary | ICD-10-CM | POA: Insufficient documentation

## 2013-04-19 DIAGNOSIS — Z79899 Other long term (current) drug therapy: Secondary | ICD-10-CM | POA: Insufficient documentation

## 2013-04-19 DIAGNOSIS — E039 Hypothyroidism, unspecified: Secondary | ICD-10-CM | POA: Insufficient documentation

## 2013-04-19 DIAGNOSIS — R0989 Other specified symptoms and signs involving the circulatory and respiratory systems: Secondary | ICD-10-CM | POA: Insufficient documentation

## 2013-04-19 DIAGNOSIS — E119 Type 2 diabetes mellitus without complications: Secondary | ICD-10-CM | POA: Insufficient documentation

## 2013-04-19 DIAGNOSIS — Z9889 Other specified postprocedural states: Secondary | ICD-10-CM | POA: Insufficient documentation

## 2013-04-19 DIAGNOSIS — I2510 Atherosclerotic heart disease of native coronary artery without angina pectoris: Secondary | ICD-10-CM | POA: Insufficient documentation

## 2013-04-19 DIAGNOSIS — Z96649 Presence of unspecified artificial hip joint: Secondary | ICD-10-CM | POA: Insufficient documentation

## 2013-04-19 DIAGNOSIS — F329 Major depressive disorder, single episode, unspecified: Secondary | ICD-10-CM | POA: Insufficient documentation

## 2013-04-19 DIAGNOSIS — F411 Generalized anxiety disorder: Secondary | ICD-10-CM | POA: Insufficient documentation

## 2013-04-19 DIAGNOSIS — Z950 Presence of cardiac pacemaker: Secondary | ICD-10-CM | POA: Insufficient documentation

## 2013-04-19 DIAGNOSIS — M79609 Pain in unspecified limb: Secondary | ICD-10-CM | POA: Insufficient documentation

## 2013-04-19 DIAGNOSIS — H5440 Blindness, one eye, unspecified eye: Secondary | ICD-10-CM | POA: Insufficient documentation

## 2013-04-19 DIAGNOSIS — M549 Dorsalgia, unspecified: Secondary | ICD-10-CM | POA: Insufficient documentation

## 2013-04-19 DIAGNOSIS — Z954 Presence of other heart-valve replacement: Secondary | ICD-10-CM | POA: Insufficient documentation

## 2013-04-19 MED ORDER — OXYCODONE-ACETAMINOPHEN 5-325 MG PO TABS: 2.0000 | ORAL_TABLET | ORAL | Status: DC | PRN

## 2013-04-19 MED ORDER — FENTANYL CITRATE 0.05 MG/ML IJ SOLN
100.0000 ug | Freq: Once | INTRAMUSCULAR | Status: AC
  Administered 2013-04-19: 100 ug via INTRAVENOUS
  Filled 2013-04-19: qty 2

## 2013-04-19 MED ORDER — OXYCODONE-ACETAMINOPHEN 5-325 MG PO TABS
2.0000 | ORAL_TABLET | Freq: Once | ORAL | Status: AC
  Administered 2013-04-19: 2 via ORAL
  Filled 2013-04-19: qty 2

## 2013-04-19 MED ORDER — DIAZEPAM 5 MG PO TABS
5.0000 mg | ORAL_TABLET | Freq: Once | ORAL | Status: AC
  Administered 2013-04-19: 5 mg via ORAL
  Filled 2013-04-19: qty 1

## 2013-04-19 MED ORDER — ONDANSETRON HCL 4 MG/2ML IJ SOLN
4.0000 mg | Freq: Once | INTRAMUSCULAR | Status: AC
  Administered 2013-04-19: 4 mg via INTRAVENOUS
  Filled 2013-04-19: qty 2

## 2013-04-19 MED ORDER — DIAZEPAM 5 MG PO TABS: 5.0000 mg | ORAL_TABLET | Freq: Two times a day (BID) | ORAL | Status: DC

## 2013-04-19 NOTE — ED Provider Notes (Signed)
Date: 04/19/2013  Rate: 86  Rhythm: Atrial sensing and ventricular electronic pacemaker  QRS Axis: normal  Intervals: normal  ST/T Wave abnormalities: normal  Conduction Disutrbances:left bundle branch block  Narrative Interpretation: Atrial sensing and ventricular pacemaker. When compared with ECG of 02/01/1999 410, no significant changes are seen.  Old EKG Reviewed: unchanged  Medical screening examination/treatment/procedure(s) were performed by non-physician practitioner and as supervising physician I was immediately available for consultation/collaboration.   Dione Booze, MD 04/19/13 [phone]

## 2013-04-19 NOTE — ED Notes (Signed)
The pt is c/o lt arm and shoulder pain for 2 months.  He has had some sob vomiting and he has been seeing a ortho doctor for the pain in his back.  He has ben out of his oxycontin for 2 days.  He  Saw  A physical therapist today and his pain has increased
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
## 2013-04-21 ENCOUNTER — Other Ambulatory Visit: Payer: Self-pay | Admitting: *Deleted

## 2013-04-21 MED ORDER — TRAZODONE HCL 100 MG PO TABS: 100.0000 mg | ORAL_TABLET | Freq: Every day | ORAL | Status: DC

## 2013-04-21 NOTE — Telephone Encounter (Signed)
LAST RF 03/13/13. LAST OV 03/28/13. CALL IN [facility].

## 2013-04-23 ENCOUNTER — Other Ambulatory Visit: Payer: Self-pay | Admitting: Orthopaedic Surgery

## 2013-04-23 DIAGNOSIS — M542 Cervicalgia: Secondary | ICD-10-CM

## 2013-04-23 DIAGNOSIS — M549 Dorsalgia, unspecified: Secondary | ICD-10-CM

## 2013-04-24 NOTE — Telephone Encounter (Signed)
TRAZADONE CALLED IN TO [facility].

## 2013-04-25 ENCOUNTER — Other Ambulatory Visit: Payer: Self-pay | Admitting: Orthopaedic Surgery

## 2013-04-25 ENCOUNTER — Ambulatory Visit
Admission: RE | Admit: 2013-04-25 | Discharge: 2013-04-25 | Disposition: A | Discharge status: Home / Self Care | Payer: Medicare Other | Source: Ambulatory Visit | Attending: Orthopaedic Surgery | Admitting: Orthopaedic Surgery

## 2013-04-25 DIAGNOSIS — M542 Cervicalgia: Secondary | ICD-10-CM

## 2013-04-25 DIAGNOSIS — M549 Dorsalgia, unspecified: Secondary | ICD-10-CM

## 2013-05-07 ENCOUNTER — Telehealth: Payer: Self-pay | Admitting: Cardiovascular Disease

## 2013-05-07 NOTE — Telephone Encounter (Signed)
Spoke with pt. He reports pain in left upper quadrant--chest, shoulder and back. Started several months ago after a fall. Pain is all the time. Worse with movement. I have asked him to contact primary care to have this pain evaluated. He is agreeable with this plan. Pt is also past due for follow up with Dr. Clifton James. Appt scheduled for him to see Norma Fredrickson, NP on May 23, 2013 at 1:30. Pt also past due for pacer check and appt with Dr.Klein. I tried to transfer pt to scheduling to arrange these appointments but was disconnected.  I then called pt back at home number but there was no answer.  I called cell phone listed for pt and spoke with a male who stated pt was there and this was his number. Call was then disconnected again. I will ask schedulers to contact pt to schedule follow up for EP appts.

## 2013-05-07 NOTE — Telephone Encounter (Signed)
Ne problem    Pain since may 8 .  Upper left quad due to surgery - 2010 aorta aneurysm.  C/O still having pain.

## 2013-05-08 ENCOUNTER — Ambulatory Visit (INDEPENDENT_AMBULATORY_CARE_PROVIDER_SITE_OTHER): Payer: Self-pay | Admitting: Pharmacist

## 2013-05-08 ENCOUNTER — Encounter: Payer: Self-pay | Admitting: Family Medicine

## 2013-05-08 ENCOUNTER — Ambulatory Visit (INDEPENDENT_AMBULATORY_CARE_PROVIDER_SITE_OTHER): Payer: Medicare Other | Admitting: Family Medicine

## 2013-05-08 VITALS — BP 175/82 | HR 74 | Temp 98.0°F | Ht 66.5 in | Wt 227.0 lb

## 2013-05-08 DIAGNOSIS — R079 Chest pain, unspecified: Secondary | ICD-10-CM

## 2013-05-08 DIAGNOSIS — G8929 Other chronic pain: Secondary | ICD-10-CM

## 2013-05-08 DIAGNOSIS — I4891 Unspecified atrial fibrillation: Secondary | ICD-10-CM

## 2013-05-08 MED ORDER — GABAPENTIN 100 MG PO CAPS: 100.0000 mg | ORAL_CAPSULE | Freq: Three times a day (TID) | ORAL | Status: DC

## 2013-05-08 NOTE — Progress Notes (Signed)
  Subjective:    Patient ID: Andre Malone, male    DOB: April 28, 1951, 63 y.o.   MRN: [id number]  HPI This is a 62 year old male with multiple medical problems including hypertension, insulin-dependent diabetes, coronary artery disease, A. fib, history of thoracic aortic aneurysm presenting with neck and shoulder pain. Patient had a fall back in May and has had persistent left-sided shoulder, back, and neck pain since this point. Patient has been seen by orthopedics for this issue with multiple image studies including CT of the neck and shoulder (MRI contraindicated in the setting of defibrillator placement). Patient's had multiple rounds of physical therapy as well as narcotics including Percocet. Patient states that Percocet has not been very effective in managing pain. Patient states she's had a flare of pain over the past 3-4 days. Patient denies any recent strenuous activity. Pain has radiated somewhat to the chest. Pain is not similar to prior episodes of chest issues but patient is somewhat concerned. No nausea or diaphoresis. Has had some left-sided shoulder pain radiation down the arm. However, this is chronic. Patient is also notes his blood pressures gone up secondary to pain.   Review of Systems  All other systems reviewed and are negative.       Objective:   Physical Exam  Constitutional:  Obese   HENT:  Head: Atraumatic.  Eyes: Pupils are equal, round, and reactive to light.  Neck:  Decreased ROM 2/2 pain    Cardiovascular: Normal rate.   Pulmonary/Chest: Effort normal and breath sounds normal.  Abdominal:  Obese abdomen,non tender    Musculoskeletal:       Arms: + TTP in noted area Decreased ROM 2/2 pain     EKG: normal sinus rhythm, old anterior infarct, unchanged from previous tracings.        Assessment & Plan:  Chronic pain - Plan: gabapentin (NEURONTIN) 100 MG capsule  Chest pain - Plan: EKG 12-Lead  Patient has had a fairly extensive workup of this  issue. This seems to be a subacute on chronic issue. We'll start patient on Neurontin as this may give some benefit from a neuropathic standpoint. Will start on 100 mg 3 times a day of titrate as clinically indicated. Discussed with patient that it may take some time for this medication to kick in. However, medication is fairly effective once it is built up into system. Plan to followup on a biweekly Timothy basis for this issue. Discuss cardiovascular red flags. EKG today within normal limits with no acute changes in comparison to old. Followup as needed.

## 2013-05-15 ENCOUNTER — Encounter: Payer: Self-pay | Admitting: Cardiology

## 2013-05-15 ENCOUNTER — Encounter: Payer: Self-pay | Admitting: Cardiovascular Disease

## 2013-05-15 ENCOUNTER — Encounter: Payer: Self-pay | Admitting: Internal Medicine

## 2013-05-20 ENCOUNTER — Other Ambulatory Visit: Payer: Self-pay | Admitting: *Deleted

## 2013-05-20 ENCOUNTER — Other Ambulatory Visit: Payer: Self-pay

## 2013-05-20 MED ORDER — LEVOTHYROXINE SODIUM 88 MCG PO TABS: 88.0000 ug | ORAL_TABLET | Freq: Every day | ORAL | Status: DC

## 2013-05-20 MED ORDER — WARFARIN SODIUM 5 MG PO TABS: 5.0000 mg | ORAL_TABLET | Freq: Every day | ORAL | Status: DC

## 2013-05-23 ENCOUNTER — Ambulatory Visit (INDEPENDENT_AMBULATORY_CARE_PROVIDER_SITE_OTHER): Payer: Medicare Other | Admitting: *Deleted

## 2013-05-23 ENCOUNTER — Ambulatory Visit
Admission: RE | Admit: 2013-05-23 | Discharge: 2013-05-23 | Disposition: A | Discharge status: Home / Self Care | Payer: Medicare Other | Source: Ambulatory Visit | Attending: Nurse Practitioner | Admitting: Nurse Practitioner

## 2013-05-23 ENCOUNTER — Encounter: Payer: Self-pay | Admitting: Nurse Practitioner

## 2013-05-23 ENCOUNTER — Ambulatory Visit (INDEPENDENT_AMBULATORY_CARE_PROVIDER_SITE_OTHER): Payer: Medicare Other | Admitting: Nurse Practitioner

## 2013-05-23 VITALS — BP 140/100 | HR 67 | Ht 66.0 in | Wt 239.8 lb

## 2013-05-23 DIAGNOSIS — I10 Essential (primary) hypertension: Secondary | ICD-10-CM

## 2013-05-23 DIAGNOSIS — I442 Atrioventricular block, complete: Secondary | ICD-10-CM

## 2013-05-23 DIAGNOSIS — I259 Chronic ischemic heart disease, unspecified: Secondary | ICD-10-CM

## 2013-05-23 DIAGNOSIS — Z950 Presence of cardiac pacemaker: Secondary | ICD-10-CM

## 2013-05-23 LAB — PACEMAKER DEVICE OBSERVATION
AL AMPLITUDE: 4.4 mv
AL IMPEDENCE PM: 387.5 Ohm
ATRIAL PACING PM: 1
BATTERY VOLTAGE: 2.9178 V
DEVICE MODEL PM: 2330065
RV LEAD IMPEDENCE PM: 612.5 Ohm
RV LEAD THRESHOLD: 0.75 V
VENTRICULAR PACING PM: 100

## 2013-05-23 MED ORDER — AMOXICILLIN 500 MG PO TABS: ORAL_TABLET | ORAL | Status: DC

## 2013-05-23 NOTE — Progress Notes (Signed)
Pacemaker check in clinic. Normal device function. Thresholds and  impedances consistent with previous measurements.  R-waves decreased from 12mV on 9/15 to 3.36mV bipolar, 4.84mV unipolar tip and 3.33mV unipolar ring.  He was reprogrammed unipolar tip.  The patient did mention that he had a recent accident and dislocated his right shoulder.  We will get a CXR today for lead placement.   Device programmed to maximize longevity. 61 mode switches, <1%.  The longest episode was 11:50 minutes.  These episodes appear to be atrial tachy.  No high ventricular rates noted. Device programmed at appropriate safety margins. Histogram distribution appropriate for patient activity level. Device programmed to optimize intrinsic conduction. Estimated longevity 6.6 years.  VIP programmed on with an extension of .  Marland Kitchen Patient enrolled in remote follow-up/TTM's with [employer]. Plan to follow every 3 months remotely and see annually in office. Patient education completed.  Merlin 08/25/13.
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
## 2013-05-23 NOTE — Patient Instructions (Addendum)
Stay on your current medicines  I am giving you a prescription for Amoxicillin 500 mg to take 4 tablets 30 to 60 minutes prior to ALL dental procedures  We will update your ultrasound of your heart and send you for a CXR today.  Go to [facility] [employer] for [employer] - on the first floor - you can walk in  See Dr. Clifton James in 6 months  Call the [facility] office at [phone] if you have any questions, problems or concerns.

## 2013-05-28 ENCOUNTER — Encounter (HOSPITAL_COMMUNITY): Payer: Self-pay | Admitting: Emergency Medicine

## 2013-05-28 ENCOUNTER — Inpatient Hospital Stay (HOSPITAL_COMMUNITY)
Admission: EM | Admit: 2013-05-28 | Discharge: 2013-06-01 | DRG: 282 | Disposition: A | Discharge status: Home / Self Care | Payer: Medicare Other | Attending: Cardiology | Admitting: Cardiology

## 2013-05-28 ENCOUNTER — Emergency Department (HOSPITAL_COMMUNITY): Payer: Medicare Other

## 2013-05-28 ENCOUNTER — Ambulatory Visit (INDEPENDENT_AMBULATORY_CARE_PROVIDER_SITE_OTHER): Payer: Medicare Other | Admitting: Family Medicine

## 2013-05-28 ENCOUNTER — Encounter: Payer: Self-pay | Admitting: Family Medicine

## 2013-05-28 ENCOUNTER — Telehealth: Payer: Self-pay | Admitting: Pharmacist

## 2013-05-28 VITALS — BP 168/78 | HR 70 | Temp 97.5°F | Resp 20

## 2013-05-28 DIAGNOSIS — I498 Other specified cardiac arrhythmias: Secondary | ICD-10-CM | POA: Diagnosis present

## 2013-05-28 DIAGNOSIS — E119 Type 2 diabetes mellitus without complications: Secondary | ICD-10-CM | POA: Diagnosis present

## 2013-05-28 DIAGNOSIS — M25519 Pain in unspecified shoulder: Secondary | ICD-10-CM | POA: Diagnosis present

## 2013-05-28 DIAGNOSIS — R079 Chest pain, unspecified: Secondary | ICD-10-CM

## 2013-05-28 DIAGNOSIS — Z79899 Other long term (current) drug therapy: Secondary | ICD-10-CM

## 2013-05-28 DIAGNOSIS — G2581 Restless legs syndrome: Secondary | ICD-10-CM | POA: Diagnosis present

## 2013-05-28 DIAGNOSIS — Z794 Long term (current) use of insulin: Secondary | ICD-10-CM

## 2013-05-28 DIAGNOSIS — I2510 Atherosclerotic heart disease of native coronary artery without angina pectoris: Secondary | ICD-10-CM

## 2013-05-28 DIAGNOSIS — Z954 Presence of other heart-valve replacement: Secondary | ICD-10-CM

## 2013-05-28 DIAGNOSIS — E039 Hypothyroidism, unspecified: Secondary | ICD-10-CM | POA: Diagnosis present

## 2013-05-28 DIAGNOSIS — R001 Bradycardia, unspecified: Secondary | ICD-10-CM

## 2013-05-28 DIAGNOSIS — F3289 Other specified depressive episodes: Secondary | ICD-10-CM | POA: Diagnosis present

## 2013-05-28 DIAGNOSIS — Z6838 Body mass index (BMI) 38.0-38.9, adult: Secondary | ICD-10-CM

## 2013-05-28 DIAGNOSIS — F411 Generalized anxiety disorder: Secondary | ICD-10-CM | POA: Diagnosis present

## 2013-05-28 DIAGNOSIS — Z7901 Long term (current) use of anticoagulants: Secondary | ICD-10-CM

## 2013-05-28 DIAGNOSIS — I214 Non-ST elevation (NSTEMI) myocardial infarction: Secondary | ICD-10-CM

## 2013-05-28 DIAGNOSIS — M19049 Primary osteoarthritis, unspecified hand: Secondary | ICD-10-CM | POA: Diagnosis present

## 2013-05-28 DIAGNOSIS — G510 Bell's palsy: Secondary | ICD-10-CM | POA: Diagnosis present

## 2013-05-28 DIAGNOSIS — Z950 Presence of cardiac pacemaker: Secondary | ICD-10-CM

## 2013-05-28 DIAGNOSIS — E669 Obesity, unspecified: Secondary | ICD-10-CM | POA: Diagnosis present

## 2013-05-28 DIAGNOSIS — Z87442 Personal history of urinary calculi: Secondary | ICD-10-CM

## 2013-05-28 DIAGNOSIS — I10 Essential (primary) hypertension: Secondary | ICD-10-CM | POA: Diagnosis present

## 2013-05-28 DIAGNOSIS — H5440 Blindness, one eye, unspecified eye: Secondary | ICD-10-CM | POA: Diagnosis present

## 2013-05-28 DIAGNOSIS — I4891 Unspecified atrial fibrillation: Secondary | ICD-10-CM

## 2013-05-28 DIAGNOSIS — F329 Major depressive disorder, single episode, unspecified: Secondary | ICD-10-CM | POA: Diagnosis present

## 2013-05-28 DIAGNOSIS — G8929 Other chronic pain: Secondary | ICD-10-CM | POA: Diagnosis present

## 2013-05-28 DIAGNOSIS — I359 Nonrheumatic aortic valve disorder, unspecified: Secondary | ICD-10-CM

## 2013-05-28 DIAGNOSIS — E7800 Pure hypercholesterolemia, unspecified: Secondary | ICD-10-CM | POA: Diagnosis present

## 2013-05-28 HISTORY — DX: Bell's palsy: G51.0

## 2013-05-28 HISTORY — DX: Bicuspid aortic valve: Q23.81

## 2013-05-28 HISTORY — DX: Hyperlipidemia, unspecified: E78.5

## 2013-05-28 HISTORY — DX: Atrioventricular block, complete: I44.2

## 2013-05-28 HISTORY — DX: Long term (current) use of anticoagulants: Z79.01

## 2013-05-28 HISTORY — DX: Type 2 diabetes mellitus without complications: E11.9

## 2013-05-28 HISTORY — DX: Congenital insufficiency of aortic valve: Q23.1

## 2013-05-28 HISTORY — DX: Obesity, unspecified: E66.9

## 2013-05-28 LAB — POCT I-STAT, CHEM 8
BUN: 13 mg/dL (ref 6–23)
Calcium, Ion: 1.28 mmol/L (ref 1.13–1.30)
Chloride: 105 mEq/L (ref 96–112)
Creatinine, Ser: 0.9 mg/dL (ref 0.50–1.35)
Glucose, Bld: 53 mg/dL — ABNORMAL LOW (ref 70–99)
HCT: 39 % (ref 39.0–52.0)
Hemoglobin: 13.3 g/dL (ref 13.0–17.0)
Potassium: 4 mEq/L (ref 3.5–5.1)
Sodium: 142 mEq/L (ref 135–145)
TCO2: 26 mmol/L (ref 0–100)

## 2013-05-28 LAB — CBC WITH DIFFERENTIAL/PLATELET
Basophils Relative: 0 % (ref 0–1)
Eosinophils Absolute: 0.2 10*3/uL (ref 0.0–0.7)
Eosinophils Relative: 3 % (ref 0–5)
HCT: 37.2 % — ABNORMAL LOW (ref 39.0–52.0)
Lymphs Abs: 1.2 10*3/uL (ref 0.7–4.0)
MCH: 31.8 pg (ref 26.0–34.0)
MCHC: 34.7 g/dL (ref 30.0–36.0)
MCV: 91.6 fL (ref 78.0–100.0)
Monocytes Absolute: 0.7 10*3/uL (ref 0.1–1.0)
Monocytes Relative: 10 % (ref 3–12)
Neutrophils Relative %: 69 % (ref 43–77)
Platelets: 156 10*3/uL (ref 150–400)
RBC: 4.06 MIL/uL — ABNORMAL LOW (ref 4.22–5.81)

## 2013-05-28 LAB — POCT I-STAT TROPONIN I: Troponin i, poc: 0.01 ng/mL (ref 0.00–0.08)

## 2013-05-28 LAB — GLUCOSE, CAPILLARY: Glucose-Capillary: 117 mg/dL — ABNORMAL HIGH (ref 70–99)

## 2013-05-28 LAB — PROTIME-INR: Prothrombin Time: 27 seconds — ABNORMAL HIGH (ref 11.6–15.2)

## 2013-05-28 MED ORDER — INFLUENZA VAC SPLIT QUAD 0.5 ML IM SUSP
0.5000 mL | INTRAMUSCULAR | Status: AC
  Filled 2013-05-28: qty 0.5

## 2013-05-28 MED ORDER — WARFARIN SODIUM 7.5 MG PO TABS
7.5000 mg | ORAL_TABLET | ORAL | Status: DC
  Filled 2013-05-28: qty 1

## 2013-05-28 MED ORDER — WARFARIN - PHARMACIST DOSING INPATIENT: Freq: Every day | Status: DC

## 2013-05-28 MED ORDER — MORPHINE SULFATE 4 MG/ML IJ SOLN
4.0000 mg | Freq: Once | INTRAMUSCULAR | Status: AC
  Administered 2013-05-28: 4 mg via INTRAVENOUS
  Filled 2013-05-28: qty 1

## 2013-05-28 MED ORDER — NITROGLYCERIN 0.4 MG SL SUBL
0.4000 mg | SUBLINGUAL_TABLET | Freq: Once | SUBLINGUAL | Status: AC
  Administered 2013-05-28: 0.4 mg via SUBLINGUAL

## 2013-05-28 MED ORDER — MORPHINE SULFATE 4 MG/ML IJ SOLN: 4.0000 mg | INTRAMUSCULAR | Status: DC | PRN

## 2013-05-28 MED ORDER — METHOCARBAMOL 500 MG PO TABS
500.0000 mg | ORAL_TABLET | ORAL | Status: DC | PRN
  Administered 2013-05-28: 500 mg via ORAL
  Filled 2013-05-28: qty 1

## 2013-05-28 MED ORDER — ASPIRIN 81 MG PO CHEW
81.0000 mg | CHEWABLE_TABLET | Freq: Once | ORAL | Status: AC
  Administered 2013-05-28: 81 mg via ORAL

## 2013-05-28 MED ORDER — SIMVASTATIN 40 MG PO TABS
40.0000 mg | ORAL_TABLET | Freq: Every day | ORAL | Status: DC
  Administered 2013-05-28 – 2013-05-30 (×3): 40 mg via ORAL
  Filled 2013-05-28 (×4): qty 1

## 2013-05-28 MED ORDER — PANTOPRAZOLE SODIUM 40 MG PO TBEC
40.0000 mg | DELAYED_RELEASE_TABLET | Freq: Every day | ORAL | Status: DC
  Administered 2013-05-29 – 2013-06-01 (×4): 40 mg via ORAL
  Filled 2013-05-28 (×4): qty 1

## 2013-05-28 MED ORDER — WARFARIN SODIUM 5 MG PO TABS
5.0000 mg | ORAL_TABLET | ORAL | Status: AC
  Administered 2013-05-28: 5 mg via ORAL
  Filled 2013-05-28: qty 1

## 2013-05-28 MED ORDER — LOSARTAN POTASSIUM 25 MG PO TABS
12.5000 mg | ORAL_TABLET | Freq: Every day | ORAL | Status: DC
Start: 2013-05-29 — End: 2013-06-01
  Administered 2013-05-29 – 2013-06-01 (×4): 12.5 mg via ORAL
  Filled 2013-05-28 (×4): qty 0.5

## 2013-05-28 MED ORDER — PRAMIPEXOLE DIHYDROCHLORIDE 0.25 MG PO TABS
0.2500 mg | ORAL_TABLET | Freq: Every day | ORAL | Status: DC | PRN
  Administered 2013-05-28 – 2013-05-31 (×2): 0.25 mg via ORAL
  Filled 2013-05-28 (×3): qty 1

## 2013-05-28 MED ORDER — INSULIN ASPART 100 UNIT/ML ~~LOC~~ SOLN
0.0000 [IU] | Freq: Three times a day (TID) | SUBCUTANEOUS | Status: DC
  Administered 2013-05-29 – 2013-05-31 (×3): 3 [IU] via SUBCUTANEOUS
  Administered 2013-05-31 – 2013-06-01 (×3): 2 [IU] via SUBCUTANEOUS

## 2013-05-28 MED ORDER — SODIUM CHLORIDE 0.9 % IJ SOLN
3.0000 mL | Freq: Two times a day (BID) | INTRAMUSCULAR | Status: DC
  Administered 2013-05-28 – 2013-05-30 (×3): 3 mL via INTRAVENOUS

## 2013-05-28 MED ORDER — TRAZODONE HCL 100 MG PO TABS
100.0000 mg | ORAL_TABLET | Freq: Every day | ORAL | Status: DC
  Administered 2013-05-28 – 2013-05-31 (×4): 100 mg via ORAL
  Filled 2013-05-28 (×5): qty 1

## 2013-05-28 MED ORDER — ONDANSETRON HCL 4 MG/2ML IJ SOLN
4.0000 mg | Freq: Four times a day (QID) | INTRAMUSCULAR | Status: DC | PRN
  Administered 2013-06-01: 4 mg via INTRAVENOUS
  Filled 2013-05-28: qty 2

## 2013-05-28 MED ORDER — TRAMADOL HCL 50 MG PO TABS
50.0000 mg | ORAL_TABLET | Freq: Four times a day (QID) | ORAL | Status: DC | PRN
  Administered 2013-05-28 – 2013-06-01 (×3): 50 mg via ORAL
  Filled 2013-05-28 (×4): qty 1

## 2013-05-28 MED ORDER — SODIUM CHLORIDE 0.9 % IJ SOLN: 3.0000 mL | INTRAMUSCULAR | Status: DC | PRN

## 2013-05-28 MED ORDER — SODIUM CHLORIDE 0.9 % IV SOLN: 250.0000 mL | INTRAVENOUS | Status: DC | PRN

## 2013-05-28 MED ORDER — NITROGLYCERIN 0.4 MG SL SUBL: 0.4000 mg | SUBLINGUAL_TABLET | SUBLINGUAL | Status: DC | PRN

## 2013-05-28 MED ORDER — SERTRALINE HCL 100 MG PO TABS
100.0000 mg | ORAL_TABLET | Freq: Every day | ORAL | Status: DC
Start: 2013-05-29 — End: 2013-06-01
  Administered 2013-05-29 – 2013-06-01 (×4): 100 mg via ORAL
  Filled 2013-05-28 (×4): qty 1

## 2013-05-28 MED ORDER — LEVOTHYROXINE SODIUM 88 MCG PO TABS
88.0000 ug | ORAL_TABLET | Freq: Every day | ORAL | Status: DC
  Administered 2013-05-29 – 2013-06-01 (×4): 88 ug via ORAL
  Filled 2013-05-28 (×5): qty 1

## 2013-05-28 MED ORDER — GABAPENTIN 100 MG PO CAPS
100.0000 mg | ORAL_CAPSULE | Freq: Three times a day (TID) | ORAL | Status: DC
  Administered 2013-05-28 – 2013-06-01 (×11): 100 mg via ORAL
  Filled 2013-05-28 (×14): qty 1
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
## 2013-05-28 NOTE — Addendum Note (Signed)
Addended by: Ardine Eng A on: 05/28/2013 05:26 PM   Modules accepted: Orders
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
## 2013-05-28 NOTE — Patient Instructions (Signed)
Chest Pain (Nonspecific) °It is often hard to give a specific diagnosis for the cause of chest pain. There is always a chance that your pain could be related to something serious, such as a heart attack or a blood clot in the lungs. You need to follow up with your caregiver for further evaluation. °CAUSES  °· Heartburn. °· Pneumonia or bronchitis. °· Anxiety or stress. °· Inflammation around your heart (pericarditis) or lung (pleuritis or pleurisy). °· A blood clot in the lung. °· A collapsed lung (pneumothorax). It can develop suddenly on its own (spontaneous pneumothorax) or from injury (trauma) to the chest. °· Shingles infection (herpes zoster virus). °The chest wall is composed of bones, muscles, and cartilage. Any of these can be the source of the pain. °· The bones can be bruised by injury. °· The muscles or cartilage can be strained by coughing or overwork. °· The cartilage can be affected by inflammation and become sore (costochondritis). °DIAGNOSIS  °Lab tests or other studies, such as X-rays, electrocardiography, stress testing, or cardiac imaging, may be needed to find the cause of your pain.  °TREATMENT  °· Treatment depends on what may be causing your chest pain. Treatment may include: °· Acid blockers for heartburn. °· Anti-inflammatory medicine. °· Pain medicine for inflammatory conditions. °· Antibiotics if an infection is present. °· You may be advised to change lifestyle habits. This includes stopping smoking and avoiding alcohol, caffeine, and chocolate. °· You may be advised to keep your head raised (elevated) when sleeping. This reduces the chance of acid going backward from your stomach into your esophagus. °· Most of the time, nonspecific chest pain will improve within 2 to 3 days with rest and mild pain medicine. °HOME CARE INSTRUCTIONS  °· If antibiotics were prescribed, take your antibiotics as directed. Finish them even if you start to feel better. °· For the next few days, avoid physical  activities that bring on chest pain. Continue physical activities as directed. °· Do not smoke. °· Avoid drinking alcohol. °· Only take over-the-counter or prescription medicine for pain, discomfort, or fever as directed by your caregiver. °· Follow your caregiver's suggestions for further testing if your chest pain does not go away. °· Keep any follow-up appointments you made. If you do not go to an appointment, you could develop lasting (chronic) problems with pain. If there is any problem keeping an appointment, you must call to reschedule. °SEEK MEDICAL CARE IF:  °· You think you are having problems from the medicine you are taking. Read your medicine instructions carefully. °· Your chest pain does not go away, even after treatment. °· You develop a rash with blisters on your chest. °SEEK IMMEDIATE MEDICAL CARE IF:  °· You have increased chest pain or pain that spreads to your arm, neck, jaw, back, or abdomen. °· You develop shortness of breath, an increasing cough, or you are coughing up blood. °· You have severe back or abdominal pain, feel nauseous, or vomit. °· You develop severe weakness, fainting, or chills. °· You have a fever. °THIS IS AN EMERGENCY. Do not wait to see if the pain will go away. Get medical help at once. Call your local emergency services (911 in [place] Do not drive yourself to the hospital. °MAKE SURE YOU:  °· Understand these instructions. °· Will watch your condition. °· Will get help right away if you are not doing well or get worse. °Document Released: 05/31/2005 Document Revised: 11/13/2011 Document Reviewed: 03/26/2008 °Exit[employer]® Patient Information ©2014 [employer]   [employer]. ° °
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
## 2013-05-28 NOTE — ED Notes (Signed)
CBG check of 117 after eating peanut butter crackers.

## 2013-05-28 NOTE — Telephone Encounter (Signed)
Patient called - he states that he had chest pain this am but has resolved.  Patient instructed to come to office to be evaluated.
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
## 2013-05-28 NOTE — ED Notes (Signed)
Per EMS pt came from [facility] family [facility] c/o CP x2 days in left chest that radiates across chest rated pain 8/10 with diaphoresis. Pain increases with palpation and breathing. Pt was given 3 SL nitroglycerin tablets and 2mg  of morphine with no relief. Pt was given an additional 2mg  morphine that decreased pain to 6/10. Pt was given 324 ASA. Some depression noted in EKG.

## 2013-05-29 DIAGNOSIS — I2510 Atherosclerotic heart disease of native coronary artery without angina pectoris: Secondary | ICD-10-CM

## 2013-05-29 DIAGNOSIS — I498 Other specified cardiac arrhythmias: Secondary | ICD-10-CM

## 2013-05-29 DIAGNOSIS — I359 Nonrheumatic aortic valve disorder, unspecified: Secondary | ICD-10-CM

## 2013-05-29 DIAGNOSIS — I214 Non-ST elevation (NSTEMI) myocardial infarction: Secondary | ICD-10-CM

## 2013-05-29 DIAGNOSIS — I369 Nonrheumatic tricuspid valve disorder, unspecified: Secondary | ICD-10-CM

## 2013-05-29 DIAGNOSIS — R079 Chest pain, unspecified: Secondary | ICD-10-CM

## 2013-05-29 LAB — LIPID PANEL
HDL: 33 mg/dL — ABNORMAL LOW (ref >39)
Total CHOL/HDL Ratio: 6.1 RATIO
Triglycerides: 130 mg/dL (ref <150)
VLDL: 26 mg/dL (ref 0–40)

## 2013-05-29 LAB — BASIC METABOLIC PANEL
Chloride: 103 mEq/L (ref 96–112)
Creatinine, Ser: 0.95 mg/dL (ref 0.50–1.35)
GFR calc Af Amer: >90 mL/min (ref >90)
Potassium: 3.6 mEq/L (ref 3.5–5.1)
Sodium: 138 mEq/L (ref 135–145)

## 2013-05-29 LAB — TROPONIN I
Troponin I: 0.34 ng/mL (ref <0.30)
Troponin I: <0.3 ng/mL (ref <0.30)
Troponin I: 0.33 ng/mL (ref <0.30)

## 2013-05-29 LAB — HEMOGLOBIN A1C
Hgb A1c MFr Bld: 6.7 % — ABNORMAL HIGH (ref <5.7)
Mean Plasma Glucose: 146 mg/dL — ABNORMAL HIGH (ref <117)

## 2013-05-29 LAB — CBC
HCT: 37.2 % — ABNORMAL LOW (ref 39.0–52.0)
MCH: 30.9 pg (ref 26.0–34.0)
MCHC: 33.6 g/dL (ref 30.0–36.0)
MCV: 91.9 fL (ref 78.0–100.0)
Platelets: 154 10*3/uL (ref 150–400)
RDW: 14.7 % (ref 11.5–15.5)
WBC: 5.7 10*3/uL (ref 4.0–10.5)

## 2013-05-29 LAB — T4, FREE: Free T4: 1.07 ng/dL (ref 0.80–1.80)

## 2013-05-29 LAB — PROTIME-INR: INR: 2.34 — ABNORMAL HIGH (ref 0.00–1.49)

## 2013-05-29 MED ORDER — ASPIRIN EC 81 MG PO TBEC
81.0000 mg | DELAYED_RELEASE_TABLET | Freq: Every day | ORAL | Status: DC
  Administered 2013-05-29 – 2013-06-01 (×3): 81 mg via ORAL
  Filled 2013-05-29 (×4): qty 1

## 2013-05-29 MED ORDER — HYDROMORPHONE HCL PF 1 MG/ML IJ SOLN
1.0000 mg | INTRAMUSCULAR | Status: DC | PRN
  Administered 2013-05-29 – 2013-06-01 (×15): 1 mg via INTRAVENOUS
  Filled 2013-05-29 (×15): qty 1

## 2013-05-29 MED ORDER — NITROGLYCERIN 2 % TD OINT
0.5000 [in_us] | TOPICAL_OINTMENT | Freq: Three times a day (TID) | TRANSDERMAL | Status: DC
  Administered 2013-05-29: 0.5 [in_us] via TOPICAL
  Filled 2013-05-29: qty 30

## 2013-05-29 NOTE — Progress Notes (Signed)
ANTICOAGULATION CONSULT NOTE - Follow Up Consult  Pharmacy Consult for Heparin  Indication: chest pain/ACS, St. Jude mechanical AVR   Allergies  Allergen Reactions  . Ace Inhibitors Other (See Comments)    cough  . Benadryl [Diphenhydramine Hcl] Other (See Comments)    Makes restless legs worse  . Diphenhydramine Hcl Other (See Comments)    Makes restless legs worse  . Metoprolol Other (See Comments)    Kidney failure  . Morphine And Related Other (See Comments)    Makes restless legs worse  . Nsaids Other (See Comments)    REACTION: Currently taking Coumadin  . Valsartan Other (See Comments)    REACTION:shuts down Kidney    Patient Measurements: Height: 5\' 6"  (167.6 cm) Weight: 235 lb 6.4 oz (106.777 kg) IBW/kg (Calculated) : 63.8 Heparin Dosing Weight: 88 kg  Vital Signs: Temp: 97.7 F (36.5 C) (09/25 0500) Temp src: Oral (09/25 0500) BP: 143/63 mmHg (09/25 0500) Pulse Rate: 59 (09/25 0500)  Labs:  Recent Labs  05/28/13 1728 05/28/13 1738 05/28/13 2159 05/29/13 0805 05/29/13 0815  HGB 12.9* 13.3  --   --  12.5*  HCT 37.2* 39.0  --   --  37.2*  PLT 156  --   --   --  154  LABPROT 27.0*  --   --  24.9*  --   INR 2.61*  --   --  2.34*  --   CREATININE  --  0.90  --  0.95  --   TROPONINI  --   --  0.34* <0.30  --     Estimated Creatinine Clearance: 93.6 ml/min (by C-G formula based on Cr of 0.95).   Medications:  Warfarin PTA (on hold)  Assessment: 62 y/o M here with NSTEMI, on chronic warfarin for St. Jude mechanical AVR. Warfarin is ON HOLD while awaiting cath, will start heparin when INR is <2. INR is 2.34 this AM (pt did get a dose of warfarin 9/24). CBC ok, renal function stable, no overt bleeding noted.   Goal of Therapy:  Heparin level 0.3-0.7 units/ml Monitor platelets by anticoagulation protocol: Yes   Plan:  -Warfarin on HOLD -Daily INR -Start heparin when INR <2  Thank you for allowing me to take part in this patient's care,  Abran Duke, PharmD Clinical Pharmacist Phone: [phone] Pager: [phone] 05/29/2013 9:59 AM

## 2013-05-29 NOTE — Progress Notes (Signed)
  Echocardiogram 2D Echocardiogram has been performed.  Brady Plant 05/29/2013, 1:02 PM

## 2013-05-29 NOTE — Progress Notes (Signed)
ANTICOAGULATION CONSULT NOTE  Pharmacy Consult for Heparin Indication: h/o St. Jude AVR, ACS  Allergies  Allergen Reactions  . Ace Inhibitors Other (See Comments)    cough  . Benadryl [Diphenhydramine Hcl] Other (See Comments)    Makes restless legs worse  . Diphenhydramine Hcl Other (See Comments)    Makes restless legs worse  . Metoprolol Other (See Comments)    Kidney failure  . Morphine And Related Other (See Comments)    Makes restless legs worse  . Nsaids Other (See Comments)    REACTION: Currently taking Coumadin  . Valsartan Other (See Comments)    REACTION:shuts down Kidney   Labs:  Recent Labs  05/28/13 1728 05/28/13 1738 05/28/13 2159  HGB 12.9* 13.3  --   HCT 37.2* 39.0  --   PLT 156  --   --   LABPROT 27.0*  --   --   INR 2.61*  --   --   CREATININE  --  0.90  --   TROPONINI  --   --  0.34*    Estimated Creatinine Clearance: 98.8 ml/min (by C-G formula based on Cr of 0.9).  Assessment: 62 y.o male on chronic coumadin therapy for AVR, INR 2.61  Heparin ordered for ACS with elevated troponin.   Goal of Therapy:  INR 2-3 Monitor platelets by anticoagulation protocol: Yes   Plan:  No heparin for now as INR > 2 F/U INR and plan for treatment in am  Geannie Risen, PharmD, BCPS 05/29/2013,4:21 AM

## 2013-05-29 NOTE — Progress Notes (Signed)
CRITICAL VALUE ALERT  Critical value received:  Troponin 0.34  Date of notification:  05/29/2013  Time of notification:  0055  Critical value read back:yes  Nurse who received alert:  Dorna Leitz RN   MD notified (1st page):  Dr. Terressa Koyanagi  Time of first page:  [phone]  MD notified (2nd page):  Time of second page:  Responding MD:  Dr. Terressa Koyanagi  Time MD responded:  [phone]
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
## 2013-05-30 ENCOUNTER — Encounter (HOSPITAL_COMMUNITY)
Admission: EM | Disposition: A | Discharge status: Home / Self Care | Payer: Self-pay | Source: Home / Self Care | Attending: Cardiology

## 2013-05-30 DIAGNOSIS — I4891 Unspecified atrial fibrillation: Secondary | ICD-10-CM

## 2013-05-30 DIAGNOSIS — I2510 Atherosclerotic heart disease of native coronary artery without angina pectoris: Secondary | ICD-10-CM

## 2013-05-30 DIAGNOSIS — I442 Atrioventricular block, complete: Secondary | ICD-10-CM

## 2013-05-30 HISTORY — PX: LEFT HEART CATHETERIZATION WITH CORONARY ANGIOGRAM: SHX5451

## 2013-05-30 LAB — CBC
HCT: 39.3 % (ref 39.0–52.0)
Hemoglobin: 12.7 g/dL — ABNORMAL LOW (ref 13.0–17.0)
MCV: 92.7 fL (ref 78.0–100.0)
Platelets: 155 10*3/uL (ref 150–400)
RBC: 4.24 MIL/uL (ref 4.22–5.81)
WBC: 5.5 10*3/uL (ref 4.0–10.5)

## 2013-05-30 LAB — GLUCOSE, CAPILLARY
Glucose-Capillary: 65 mg/dL — ABNORMAL LOW (ref 70–99)
Glucose-Capillary: 84 mg/dL (ref 70–99)
Glucose-Capillary: 115 mg/dL — ABNORMAL HIGH (ref 70–99)
Glucose-Capillary: 177 mg/dL — ABNORMAL HIGH (ref 70–99)

## 2013-05-30 LAB — BASIC METABOLIC PANEL
CO2: 26 mEq/L (ref 19–32)
Chloride: 105 mEq/L (ref 96–112)
Creatinine, Ser: 0.97 mg/dL (ref 0.50–1.35)
Glucose, Bld: 89 mg/dL (ref 70–99)
Potassium: 4 mEq/L (ref 3.5–5.1)
Sodium: 140 mEq/L (ref 135–145)

## 2013-05-30 LAB — PROTIME-INR
INR: 1.79 — ABNORMAL HIGH (ref 0.00–1.49)
Prothrombin Time: 20.3 seconds — ABNORMAL HIGH (ref 11.6–15.2)

## 2013-05-30 SURGERY — LEFT HEART CATHETERIZATION WITH CORONARY ANGIOGRAM
Anesthesia: LOCAL

## 2013-05-30 MED ORDER — WARFARIN SODIUM 7.5 MG PO TABS
7.5000 mg | ORAL_TABLET | Freq: Once | ORAL | Status: AC
  Administered 2013-05-30: 7.5 mg via ORAL
  Filled 2013-05-30: qty 1

## 2013-05-30 MED ORDER — MIDAZOLAM HCL 2 MG/2ML IJ SOLN
INTRAMUSCULAR | Status: AC
  Filled 2013-05-30: qty 2

## 2013-05-30 MED ORDER — HEPARIN (PORCINE) IN NACL 100-0.45 UNIT/ML-% IJ SOLN
1300.0000 [IU]/h | INTRAMUSCULAR | Status: DC
  Administered 2013-05-30: 1300 [IU]/h via INTRAVENOUS
  Filled 2013-05-30 (×2): qty 250

## 2013-05-30 MED ORDER — INFLUENZA VAC SPLIT QUAD 0.5 ML IM SUSP
0.5000 mL | INTRAMUSCULAR | Status: AC
  Administered 2013-05-31: 0.5 mL via INTRAMUSCULAR
  Filled 2013-05-30: qty 0.5

## 2013-05-30 MED ORDER — ISOSORBIDE MONONITRATE ER 60 MG PO TB24
60.0000 mg | ORAL_TABLET | Freq: Every day | ORAL | Status: DC
  Administered 2013-05-30 – 2013-06-01 (×3): 60 mg via ORAL
  Filled 2013-05-30 (×3): qty 1

## 2013-05-30 MED ORDER — ALPRAZOLAM 0.25 MG PO TABS
0.2500 mg | ORAL_TABLET | Freq: Three times a day (TID) | ORAL | Status: DC | PRN
  Administered 2013-05-30: 0.25 mg via ORAL
  Filled 2013-05-30: qty 1

## 2013-05-30 MED ORDER — ACETAMINOPHEN 325 MG PO TABS: 650.0000 mg | ORAL_TABLET | ORAL | Status: DC | PRN

## 2013-05-30 MED ORDER — SODIUM CHLORIDE 0.9 % IV SOLN: 250.0000 mL | INTRAVENOUS | Status: DC | PRN

## 2013-05-30 MED ORDER — LIDOCAINE HCL (PF) 1 % IJ SOLN
INTRAMUSCULAR | Status: AC
  Filled 2013-05-30: qty 30

## 2013-05-30 MED ORDER — WARFARIN - PHARMACIST DOSING INPATIENT
Freq: Every day | Status: DC
  Administered 2013-05-31: 18:00:00

## 2013-05-30 MED ORDER — ASPIRIN 81 MG PO CHEW
324.0000 mg | CHEWABLE_TABLET | ORAL | Status: AC
  Administered 2013-05-30: 324 mg via ORAL
  Filled 2013-05-30: qty 4

## 2013-05-30 MED ORDER — VERAPAMIL HCL 2.5 MG/ML IV SOLN
INTRAVENOUS | Status: AC
  Filled 2013-05-30: qty 2

## 2013-05-30 MED ORDER — HEPARIN (PORCINE) IN NACL 2-0.9 UNIT/ML-% IJ SOLN
INTRAMUSCULAR | Status: AC
  Filled 2013-05-30: qty 1000

## 2013-05-30 MED ORDER — AMLODIPINE BESYLATE 5 MG PO TABS
5.0000 mg | ORAL_TABLET | Freq: Every day | ORAL | Status: DC
  Administered 2013-05-30 – 2013-06-01 (×3): 5 mg via ORAL
  Filled 2013-05-30 (×3): qty 1

## 2013-05-30 MED ORDER — SODIUM CHLORIDE 0.9 % IJ SOLN
3.0000 mL | Freq: Two times a day (BID) | INTRAMUSCULAR | Status: DC
  Administered 2013-05-30: 3 mL via INTRAVENOUS

## 2013-05-30 MED ORDER — HEPARIN SODIUM (PORCINE) 1000 UNIT/ML IJ SOLN
INTRAMUSCULAR | Status: AC
  Filled 2013-05-30: qty 1

## 2013-05-30 MED ORDER — SODIUM CHLORIDE 0.9 % IV SOLN
1.0000 mL/kg/h | INTRAVENOUS | Status: DC
  Administered 2013-05-30: 1 mL/kg/h via INTRAVENOUS

## 2013-05-30 MED ORDER — FENTANYL CITRATE 0.05 MG/ML IJ SOLN
INTRAMUSCULAR | Status: AC
  Filled 2013-05-30: qty 2

## 2013-05-30 MED ORDER — NITROGLYCERIN 0.2 MG/ML ON CALL CATH LAB
INTRAVENOUS | Status: AC
  Filled 2013-05-30: qty 1

## 2013-05-30 MED ORDER — SODIUM CHLORIDE 0.9 % IJ SOLN: 3.0000 mL | INTRAMUSCULAR | Status: DC | PRN

## 2013-05-30 MED ORDER — SODIUM CHLORIDE 0.9 % IV SOLN: 1.0000 mL/kg/h | INTRAVENOUS | Status: AC

## 2013-05-30 NOTE — Interval H&P Note (Signed)
History and Physical Interval Note:  05/30/2013 1:07 PM  Andre Malone  has presented today for surgery, with the diagnosis of Chest pain  The various methods of treatment have been discussed with the patient and family. After consideration of risks, benefits and other options for treatment, the patient has consented to  Procedure(s): LEFT HEART CATHETERIZATION WITH CORONARY ANGIOGRAM (N/A) as a surgical intervention .  The patient's history has been reviewed, patient examined, no change in status, stable for surgery.  I have reviewed the patient's chart and labs.  Questions were answered to the patient's satisfaction.    Cath Lab Visit (complete for each Cath Lab visit)  Clinical Evaluation Leading to the Procedure:   ACS: yes  Non-ACS:    Anginal Classification: CCS IV  Anti-ischemic medical therapy: No Therapy  Non-Invasive Test Results: No non-invasive testing performed  Prior CABG: No previous CABG       Peter [place] MD,FACC
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
## 2013-05-30 NOTE — CV Procedure (Signed)
Cardiac Catheterization Procedure Note  Name: Andre Malone MRN: [id number] DOB: [birth year]  Procedure:  Selective Coronary Angiography  Indication: 62 year old white male with known history of coronary disease. He is status post stenting of the right coronary in 2006 with a bare-metal stent. He is status post mechanical aortic valve replacement and Bentall procedure. He presents now with chest pain and elevated troponin levels consistent with a non-ST elevation myocardial infarction. ECG shows a right bundle branch block. Coumadin was held for the procedure.   Procedural Details: The right wrist was prepped, draped, and anesthetized with 1% lidocaine. Using the modified Seldinger technique, a 5 French sheath was introduced into the right radial artery. 3 mg of verapamil was administered through the sheath, weight-based unfractionated heparin was administered intravenously. Because of distorted aortic anatomy from his prior Bentall procedure and a loop at the origin of the innominate artery it was quite difficult to engage the coronaries. The right coronary was engaged with a JR 4 catheter. The left coronary was engaged with an ERAD left catheter. Catheter exchanges were performed over an exchange length guidewire. There were no immediate procedural complications. A TR band was used for radial hemostasis at the completion of the procedure.  The patient was transferred to the post catheterization recovery area for further monitoring.  Procedural Findings: Hemodynamics: AO 142/68 with a mean of 99 mmHg   Coronary angiography: Coronary dominance: right  Left mainstem: The left main coronary has mild tapering distally to 20%.  Left anterior descending (LAD): The left anterior descending artery gives rise to a moderate-sized first diagonal branch and a smaller second diagonal branch. The LAD is diffusely diseased. There is a 40% stenosis at the ostium. At the bifurcation of the first diagonal  branch there is diffuse moderate disease up to 50% involving the LAD proximal and distal to the diagonal as well as the proximal diagonal. In the mid LAD at the takeoff of the second diagonal there is a 60-70% stenosis.  There is a moderate-sized ramus intermediate branch. There is a  70% lesion in the proximal ramus branch.  Left circumflex (LCx): The left circumflex is a large vessel giving rise to a large posterior lateral branch. There is a 30% stenosis in the mid circumflex.  Right coronary artery (RCA): The right coronary is a dominant vessel. It has diffuse disease in the proximal vessel up to 30-40%. The previous stent in the mid vessel is still widely patent. There is a 40% stenosis in the distal RCA. In the mid PDA there is a focal 80-90% stenosis. The vessel at this point is only 2 mm in diameter.   Final Conclusions:   1. 3 vessel coronary disease. Patient has moderate diffuse disease in the proximal and mid LAD. There is a moderate lesion in the proximal ramus intermediate branch. There is a severe stenosis in the mid PDA. The prior stent in the mid RCA is patent.   Recommendations:  Based on his angiographic data I think that the mid PDA lesion is likely his culprit. This is a small distal branch. The patient has not been on any anti-anginal therapy. He is not a candidate for beta blocker therapy due to his resting bradycardia. I recommend aggressive medical therapy at this point. We will start him on amlodipine and a long-acting nitrate. We will resume his Coumadin therapy. Continue statin therapy. Ranexa could also be added if needed. If he has refractory symptoms despite optimal medical therapy PCI of the mid PDA  could be considered.   Theron Arista [facility] 05/30/2013, 1:56 PM

## 2013-05-30 NOTE — Progress Notes (Signed)
ANTICOAGULATION CONSULT NOTE - Follow Up Consult  Pharmacy Consult for Heparin Indication: St. Jude MVR  Allergies  Allergen Reactions  . Ace Inhibitors Other (See Comments)    cough  . Benadryl [Diphenhydramine Hcl] Other (See Comments)    Makes restless legs worse  . Diphenhydramine Hcl Other (See Comments)    Makes restless legs worse  . Metoprolol Other (See Comments)    Kidney failure  . Morphine And Related Other (See Comments)    Makes restless legs worse  . Nsaids Other (See Comments)    REACTION: Currently taking Coumadin  . Valsartan Other (See Comments)    REACTION:shuts down Kidney    Patient Measurements: Height: 5\' 6"  (167.6 cm) Weight: 235 lb 9.6 oz (106.867 kg) IBW/kg (Calculated) : 63.8 Heparin Dosing Weight:   Vital Signs: Temp: 98.8 F (37.1 C) (09/26 0508) Temp src: Oral (09/26 0508) BP: 142/83 mmHg (09/26 0508) Pulse Rate: 59 (09/26 0508)  Labs:  Recent Labs  05/28/13 1728 05/28/13 1738  05/29/13 0805 05/29/13 0815 05/29/13 1446 05/29/13 2028 05/30/13 0445  HGB 12.9* 13.3  --   --  12.5*  --   --  12.7*  HCT 37.2* 39.0  --   --  37.2*  --   --  39.3  PLT 156  --   --   --  154  --   --  155  LABPROT 27.0*  --   --  24.9*  --   --   --  20.3*  INR 2.61*  --   --  2.34*  --   --   --  1.79*  CREATININE  --  0.90  --  0.95  --   --   --  0.97  TROPONINI  --   --   < > <0.30  --  <0.30 0.33*  --   < > = values in this interval not displayed.  Estimated Creatinine Clearance: 91.6 ml/min (by C-G formula based on Cr of 0.97).   Medications:  Scheduled:  . [START ON 05/31/2013] aspirin  324 mg Oral Pre-Cath  . aspirin EC  81 mg Oral Daily  . gabapentin  100 mg Oral TID  . insulin aspart  0-15 Units Subcutaneous TID WC  . levothyroxine  88 mcg Oral QAC breakfast  . losartan  12.5 mg Oral Daily  . pantoprazole  40 mg Oral Q0600  . sertraline  100 mg Oral Daily  . simvastatin  40 mg Oral QHS  . sodium chloride  3 mL Intravenous Q12H  .  sodium chloride  3 mL Intravenous Q12H  . traZODone  100 mg Oral QHS    Assessment: 62yo male with St. Jude MVR with INR 1,79 this AM, to start Heparin.  Pt may go to cath lab today.  No bleeding problems noted.  Goal of Therapy:  Heparin level 0.3-0.7 units/ml Monitor platelets by anticoagulation protocol: Yes   Plan:  1.  Heparin 1300 units/hr, no bolus 2.  Heparin level 8 hours 3.  Daily HL, CBC 4.  F/U for resume Coumadin  Marisue Humble, PharmD Clinical Pharmacist [facility]
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
## 2013-05-30 NOTE — Progress Notes (Signed)
Called by RN re: chest pain  Pt has had recurrent chest pain this pm. He is tolerating his medications well, but may be anxious due to being in the hospital and his medical problems.  Will add low-dose PRN Xanax overnight.   MD to see in am.

## 2013-05-30 NOTE — Progress Notes (Signed)
ANTICOAGULATION CONSULT NOTE - Follow Up Consult  Pharmacy Consult for Coumadin Indication: Mechanical AVR  Allergies  Allergen Reactions  . Ace Inhibitors Other (See Comments)    cough  . Benadryl [Diphenhydramine Hcl] Other (See Comments)    Makes restless legs worse  . Diphenhydramine Hcl Other (See Comments)    Makes restless legs worse  . Metoprolol Other (See Comments)    Kidney failure  . Morphine And Related Other (See Comments)    Makes restless legs worse  . Nsaids Other (See Comments)    REACTION: Currently taking Coumadin  . Valsartan Other (See Comments)    REACTION:shuts down Kidney    Patient Measurements: Height: 5\' 6"  (167.6 cm) Weight: 235 lb 9.6 oz (106.867 kg) IBW/kg (Calculated) : 63.8 Heparin Dosing Weight:   Vital Signs: Temp: 98.8 F (37.1 C) (09/26 0508) Temp src: Oral (09/26 0508) BP: 142/83 mmHg (09/26 0508) Pulse Rate: 66 (09/26 1304)  Labs:  Recent Labs  05/28/13 1728 05/28/13 1738  05/29/13 0805 05/29/13 0815 05/29/13 1446 05/29/13 2028 05/30/13 0445  HGB 12.9* 13.3  --   --  12.5*  --   --  12.7*  HCT 37.2* 39.0  --   --  37.2*  --   --  39.3  PLT 156  --   --   --  154  --   --  155  LABPROT 27.0*  --   --  24.9*  --   --   --  20.3*  INR 2.61*  --   --  2.34*  --   --   --  1.79*  CREATININE  --  0.90  --  0.95  --   --   --  0.97  TROPONINI  --   --   < > <0.30  --  <0.30 0.33*  --   < > = values in this interval not displayed.  Estimated Creatinine Clearance: 91.6 ml/min (by C-G formula based on Cr of 0.97).   Medications:  Prescriptions prior to admission  Medication Sig Dispense Refill  . amoxicillin (AMOXIL) 500 MG tablet Take 4 tablets (2gm) 30 to 60 minutes prior to dental procedures  16 tablet  3  . gabapentin (NEURONTIN) 100 MG capsule Take 1 capsule (100 mg total) by mouth 3 (three) times daily.  90 capsule  3  . insulin glargine (LANTUS) 100 UNIT/ML injection Inject 50 Units into the skin daily after supper.        . levothyroxine (SYNTHROID, LEVOTHROID) 88 MCG tablet Take 1 tablet (88 mcg total) by mouth daily.  30 tablet  2  . losartan (COZAAR) 25 MG tablet Take 12.5 mg by mouth daily.       . metFORMIN (GLUCOPHAGE) 1000 MG tablet Take 1,000 mg by mouth 2 (two) times daily with a meal.       . methocarbamol (ROBAXIN) 500 MG tablet Take 500 mg by mouth as needed.       . Multiple Vitamins-Minerals (ADULT GUMMY PO) Take 2 tablets by mouth.      . pramipexole (MIRAPEX) 0.25 MG tablet Take 1 tablet by mouth daily as needed (restless leg).       . sertraline (ZOLOFT) 100 MG tablet Take 100 mg by mouth daily.        . simvastatin (ZOCOR) 40 MG tablet Take 1 tablet (40 mg total) by mouth at bedtime.  90 tablet  3  . Tamsulosin HCl (FLOMAX) 0.4 MG CAPS Take 0.4 mg by  mouth as needed. For kidney stones      . traMADol (ULTRAM) 50 MG tablet Take 50-100 mg by mouth every 6 (six) hours as needed. pain      . traZODone (DESYREL) 100 MG tablet Take 1 tablet (100 mg total) by mouth at bedtime.  30 tablet  1  . warfarin (COUMADIN) 5 MG tablet Take 1-1.5 tablets (5-7.5 mg total) by mouth daily. 7.5 mg on Monday and Thursdays. 5 mg on all other days.  45 tablet  2    Assessment: 61yom s/p cath finding moderate CAD to restart Coumadin for mechanical AVR. Heparin was discontinued post-cath by MD. INR decreased from 2.34 to 1.79 while Coumadin on hold for cath. Per patient and Coumadin Clinic notes, PTA regimen is 5mg  daily except 7.5mg  on Mondays and Thursdays. With held dose on 9/25, will restart patient with Coumadin 7.5mg  tonight and can most likely restart PTA regimen tomorrow. - H/H and Plts stable - No significant bleeding reported  Goal of Therapy:  INR 2-3 Monitor platelets by anticoagulation protocol: Yes   Plan:  1. Coumadin 7.5mg  po x 1 today 2. Daily INR  Cleon Dew [phone] 05/30/2013,3:11 PM

## 2013-05-31 LAB — CBC
HCT: 35.9 % — ABNORMAL LOW (ref 39.0–52.0)
Hemoglobin: 12.1 g/dL — ABNORMAL LOW (ref 13.0–17.0)
MCH: 30.9 pg (ref 26.0–34.0)
MCHC: 33.7 g/dL (ref 30.0–36.0)
MCV: 91.8 fL (ref 78.0–100.0)
Platelets: 156 10*3/uL (ref 150–400)
WBC: 5.8 10*3/uL (ref 4.0–10.5)

## 2013-05-31 LAB — GLUCOSE, CAPILLARY: Glucose-Capillary: 172 mg/dL — ABNORMAL HIGH (ref 70–99)

## 2013-05-31 LAB — PROTIME-INR: INR: 1.58 — ABNORMAL HIGH (ref 0.00–1.49)

## 2013-05-31 MED ORDER — ATORVASTATIN CALCIUM 20 MG PO TABS
20.0000 mg | ORAL_TABLET | Freq: Every day | ORAL | Status: DC
  Administered 2013-05-31: 20 mg via ORAL
  Filled 2013-05-31 (×2): qty 1

## 2013-05-31 MED ORDER — WARFARIN SODIUM 7.5 MG PO TABS
7.5000 mg | ORAL_TABLET | Freq: Once | ORAL | Status: AC
  Administered 2013-05-31: 7.5 mg via ORAL
  Filled 2013-05-31: qty 1

## 2013-05-31 MED ORDER — HEPARIN (PORCINE) IN NACL 100-0.45 UNIT/ML-% IJ SOLN
1500.0000 [IU]/h | INTRAMUSCULAR | Status: AC
  Administered 2013-05-31: 1300 [IU]/h via INTRAVENOUS
  Filled 2013-05-31 (×2): qty 250

## 2013-05-31 NOTE — Progress Notes (Signed)
Pt reported that he passing kidney stones. A small brown particle was seen in his urinal. The particle was taken out of his urinal and placed in a specimen cut. We will show it to MD in AM.

## 2013-05-31 NOTE — Progress Notes (Signed)
ANTICOAGULATION CONSULT NOTE - Follow Up Consult  Pharmacy Consult for Heparin, Coumadin Indication: St. Jude AVR  Allergies  Allergen Reactions  . Ace Inhibitors Other (See Comments)    cough  . Benadryl [Diphenhydramine Hcl] Other (See Comments)    Makes restless legs worse  . Diphenhydramine Hcl Other (See Comments)    Makes restless legs worse  . Metoprolol Other (See Comments)    Kidney failure  . Morphine And Related Other (See Comments)    Makes restless legs worse  . Nsaids Other (See Comments)    REACTION: Currently taking Coumadin  . Valsartan Other (See Comments)    REACTION:shuts down Kidney    Patient Measurements: Height: 5\' 6"  (167.6 cm) Weight: 235 lb 9.6 oz (106.867 kg) IBW/kg (Calculated) : 63.8 Heparin Dosing Weight:   Vital Signs: Temp: 98.2 F (36.8 C) (09/27 0534) Temp src: Oral (09/27 0534) BP: 136/53 mmHg (09/27 0534) Pulse Rate: 60 (09/27 0534)  Labs:  Recent Labs  05/28/13 1738  05/29/13 0805 05/29/13 0815 05/29/13 1446 05/29/13 2028 05/30/13 0445 05/30/13 1054 05/31/13 0535  HGB 13.3  --   --  12.5*  --   --  12.7*  --  12.1*  HCT 39.0  --   --  37.2*  --   --  39.3  --  35.9*  PLT  --   --   --  154  --   --  155  --  156  LABPROT  --   --  24.9*  --   --   --  20.3*  --  18.4*  INR  --   --  2.34*  --   --   --  1.79*  --  1.58*  CREATININE 0.90  --  0.95  --   --   --  0.97  --   --   TROPONINI  --   < > <0.30  --  <0.30 0.33*  --  <0.30  --   < > = values in this interval not displayed.  Estimated Creatinine Clearance: 91.6 ml/min (by C-G formula based on Cr of 0.97).   Medications:  Scheduled:  . amLODipine  5 mg Oral Daily  . aspirin EC  81 mg Oral Daily  . gabapentin  100 mg Oral TID  . insulin aspart  0-15 Units Subcutaneous TID WC  . isosorbide mononitrate  60 mg Oral Daily  . levothyroxine  88 mcg Oral QAC breakfast  . losartan  12.5 mg Oral Daily  . pantoprazole  40 mg Oral Q0600  . sertraline  100 mg Oral  Daily  . simvastatin  40 mg Oral QHS  . sodium chloride  3 mL Intravenous Q12H  . traZODone  100 mg Oral QHS  . Warfarin - Pharmacist Dosing Inpatient   Does not apply q1800    Assessment: 62yo male with St. Jude AVR.  Heparin d/c'd s/p cath yesterday and Coumadin resumed.  INR 1.58 this AM.  Discussed need for bridge with S.Weaver, PA and will continue heparin.  Hg 12.1 and pltc is stable.  Pt was only briefly on heparin prior to cath, so there is no heparin level to verify previous rate therapeutic.  No bolus as INR > 1.5  Goal of Therapy:  Heparin level 0.3-0.7 units/ml and INR 2-3 Monitor platelets by anticoagulation protocol: Yes   Plan:  1.  Heparin 1300 units/hr 2.  Repeat Coumadin 7.5mg  3.  Heparin level 8hr after heparin initiated 4.  Daily  HL, CBC, INR  Marisue Humble, PharmD Clinical Pharmacist [facility]

## 2013-05-31 NOTE — Progress Notes (Signed)
SUBJECTIVE:  No chest pain.  No SOB.   PHYSICAL EXAM Filed Vitals:   05/30/13 1700 05/30/13 1815 05/30/13 1938 05/31/13 0534  BP: 146/61 156/68 142/60 136/53  Pulse: 58 64 54 60  Temp:   98.2 F (36.8 C) 98.2 F (36.8 C)  TempSrc:   Oral Oral  Resp:   18 18  Height:      Weight:      SpO2:   94% 98%   General:  No distress Lungs:  Clear Heart:  RRR, mechanical S2 with murmur Abdomen:  Positive bowel sounds, no rebound no guarding Extremities:  Right wrist without bruising or bleeding.  LABS:  Results for orders placed during the hospital encounter of 05/28/13 (from the past 24 hour(s))  GLUCOSE, CAPILLARY     Status: Abnormal   Collection Time    05/30/13 12:32 PM      Result Value Range   Glucose-Capillary 115 (*) 70 - 99 mg/dL  GLUCOSE, CAPILLARY     Status: Abnormal   Collection Time    05/30/13  5:25 PM      Result Value Range   Glucose-Capillary 171 (*) 70 - 99 mg/dL  GLUCOSE, CAPILLARY     Status: Abnormal   Collection Time    05/30/13  9:39 PM      Result Value Range   Glucose-Capillary 177 (*) 70 - 99 mg/dL   Comment 1 Notify RN    PROTIME-INR     Status: Abnormal   Collection Time    05/31/13  5:35 AM      Result Value Range   Prothrombin Time 18.4 (*) 11.6 - 15.2 seconds   INR 1.58 (*) 0.00 - 1.49  CBC     Status: Abnormal   Collection Time    05/31/13  5:35 AM      Result Value Range   WBC 5.8  4.0 - 10.5 K/uL   RBC 3.91 (*) 4.22 - 5.81 MIL/uL   Hemoglobin 12.1 (*) 13.0 - 17.0 g/dL   HCT 16.1 (*) 09.6 - 04.5 %   MCV 91.8  78.0 - 100.0 fL   MCH 30.9  26.0 - 34.0 pg   MCHC 33.7  30.0 - 36.0 g/dL   RDW 40.9  81.1 - 91.4 %   Platelets 156  150 - 400 K/uL  GLUCOSE, CAPILLARY     Status: Abnormal   Collection Time    05/31/13  8:11 AM      Result Value Range   Glucose-Capillary 125 (*) 70 - 99 mg/dL   Comment 1 Documented in Chart     Comment 2 Notify RN      Intake/Output Summary (Last 24 hours) at 05/31/13 1111 Last data filed at  05/31/13 0521  Gross per 24 hour  Intake    321 ml  Output    600 ml  Net   -279 ml     ASSESSMENT AND PLAN:  CHEST PAIN/NSTEMI:   Cath demonstrated moderate diffuse disease in the proximal and mid LAD. There is a moderate lesion in the proximal ramus intermediate branch. There is a severe stenosis in the mid PDA. The prior stent in the mid RCA is patent. Aggressive medical therapy with Norvasc and nitrates.  Renexa if needed as an outpatient.   MECHANICAL ST JUDE:  He has been bridged before with Lovenox.  We will bridge with heparin so he will stay until the INR is greater than two  CHB:  Pacemaker  KIDNEY  STONE:   He passes 3 - 4 per year.  No blood visible in the urine.  No further pain.  HTN:    Continue current therapy.   Fayrene Fearing [facility] 05/31/2013 11:11 AM

## 2013-05-31 NOTE — Progress Notes (Signed)
Came to walk with pt however sts he is having 6/10 lower back pain due to passing kidney stone. Asked to rest until this pm. Will f/u. Ethelda Chick CES, ACSM 9:35 AM 05/31/2013

## 2013-05-31 NOTE — Progress Notes (Signed)
CARDIAC REHAB PHASE I   PRE:  Rate/Rhythm: 54 SB    BP: sitting 148/80    SaO2:   MODE:  Ambulation: 500 ft   POST:  Rate/Rhythm: 79 SR    BP: sitting 160/80     SaO2: 97 RA  Pt denied angina (describes someone pressing a finger in between his shoulder blades) in bed. Upon getting up pt sts he can feel angina beginning. Able to walk 500 ft independently with quick pace. Angina began radiating to left shoulder. Still present after walk. Applied 2L O2 in recliner. Pt declined NTG at this time. Wife present with him. Gave diet sheets and discussed NTG. Pt overwhelmed by everything.  [id number]   Elissa Lovett [place] CES, ACSM 05/31/2013 1:58 PM

## 2013-06-01 ENCOUNTER — Encounter (HOSPITAL_COMMUNITY): Payer: Self-pay | Admitting: Physician Assistant

## 2013-06-01 LAB — GLUCOSE, CAPILLARY: Glucose-Capillary: 129 mg/dL — ABNORMAL HIGH (ref 70–99)

## 2013-06-01 LAB — CBC
HCT: 36.2 % — ABNORMAL LOW (ref 39.0–52.0)
Hemoglobin: 12.4 g/dL — ABNORMAL LOW (ref 13.0–17.0)
MCH: 31.2 pg (ref 26.0–34.0)
MCHC: 34.3 g/dL (ref 30.0–36.0)
MCV: 91.2 fL (ref 78.0–100.0)
Platelets: 141 10*3/uL — ABNORMAL LOW (ref 150–400)
RBC: 3.97 MIL/uL — ABNORMAL LOW (ref 4.22–5.81)
RDW: 14.1 % (ref 11.5–15.5)
WBC: 7 10*3/uL (ref 4.0–10.5)

## 2013-06-01 LAB — HEPARIN LEVEL (UNFRACTIONATED)
Heparin Unfractionated: 0.26 IU/mL — ABNORMAL LOW (ref 0.30–0.70)
Heparin Unfractionated: 0.39 IU/mL (ref 0.30–0.70)

## 2013-06-01 LAB — PROTIME-INR
INR: 1.67 — ABNORMAL HIGH (ref 0.00–1.49)
Prothrombin Time: 19.2 seconds — ABNORMAL HIGH (ref 11.6–15.2)

## 2013-06-01 MED ORDER — WARFARIN SODIUM 7.5 MG PO TABS
7.5000 mg | ORAL_TABLET | Freq: Once | ORAL | Status: DC
  Filled 2013-06-01: qty 1

## 2013-06-01 MED ORDER — ISOSORBIDE MONONITRATE ER 60 MG PO TB24: 60.0000 mg | ORAL_TABLET | Freq: Every day | ORAL | Status: DC

## 2013-06-01 MED ORDER — ENOXAPARIN SODIUM 120 MG/0.8ML ~~LOC~~ SOLN: 110.0000 mg | Freq: Two times a day (BID) | SUBCUTANEOUS | Status: DC

## 2013-06-01 MED ORDER — NITROGLYCERIN 0.4 MG SL SUBL: 0.4000 mg | SUBLINGUAL_TABLET | SUBLINGUAL | Status: DC | PRN

## 2013-06-01 MED ORDER — ENOXAPARIN SODIUM 120 MG/0.8ML ~~LOC~~ SOLN
110.0000 mg | Freq: Two times a day (BID) | SUBCUTANEOUS | Status: DC
  Filled 2013-06-01: qty 0.8

## 2013-06-01 MED ORDER — ENOXAPARIN SODIUM 120 MG/0.8ML ~~LOC~~ SOLN: 110.0000 mg | Freq: Once | SUBCUTANEOUS | Status: DC

## 2013-06-01 MED ORDER — ASPIRIN 81 MG PO TBEC: 81.0000 mg | DELAYED_RELEASE_TABLET | Freq: Every day | ORAL | Status: DC

## 2013-06-01 MED ORDER — AMLODIPINE BESYLATE 5 MG PO TABS: 5.0000 mg | ORAL_TABLET | Freq: Every day | ORAL | Status: DC

## 2013-06-01 MED ORDER — SIMVASTATIN 40 MG PO TABS: 20.0000 mg | ORAL_TABLET | Freq: Every day | ORAL | Status: DC

## 2013-06-01 MED ORDER — METFORMIN HCL 1000 MG PO TABS: 1000.0000 mg | ORAL_TABLET | Freq: Two times a day (BID) | ORAL | Status: DC

## 2013-06-01 MED ORDER — ENOXAPARIN SODIUM 120 MG/0.8ML ~~LOC~~ SOLN
110.0000 mg | Freq: Once | SUBCUTANEOUS | Status: AC
  Administered 2013-06-01: 110 mg via SUBCUTANEOUS
  Filled 2013-06-01: qty 0.8
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
## 2013-06-01 NOTE — Progress Notes (Signed)
   CARE MANAGEMENT NOTE 06/01/2013  Patient:  Andre Malone, Andre Malone   Account Number:  [id number]  Date Initiated:  06/01/2013  Documentation initiated by:  [facility]  Subjective/Objective Assessment:   NWG:NFAOZ pain     Action/Plan:   discharge planning   Anticipated DC Date:  06/01/2013   Anticipated DC Plan:  HOME/SELF CARE      DC Planning Services  CM consult      Choice offered to / List presented to:             Status of service:  Completed, signed off Medicare Important Message given?   (If response is "NO", the following Medicare IM given date fields will be blank) Date Medicare IM given:   Date Additional Medicare IM given:    Discharge Disposition:  HOME/SELF CARE  Per UR Regulation:    If discussed at Long Length of Stay Meetings, dates discussed:    Comments:  06/01/13 15:30 PA called to see if CM could check on price and availability of generic lovenox.  CM called pt's pharmacy in [place] [phone]).  [facility] states pt has had enoxaparin in the past.  [facility] states they have it in stock.   Insurance coverage same as when pt had enoxaparin in past and cost would be approximately $6.00. CM spoke to Andre Malone who stated "that sounds about right."  PA notified of findings.  No other Cm needs were communicated.  [name], [phone].

## 2013-06-01 NOTE — Progress Notes (Signed)
SUBJECTIVE:  No chest pain.  No SOB.  He wants to go home.    PHYSICAL EXAM Filed Vitals:   05/31/13 0534 05/31/13 1420 05/31/13 2100 06/01/13 0500  BP: 136/53 137/55 152/72 158/64  Pulse: 60 57 65 64  Temp: 98.2 F (36.8 C) 98 F (36.7 C) 97.8 F (36.6 C) 98 F (36.7 C)  TempSrc: Oral Oral    Resp: 18 18 18 18   Height:      Weight:    226 lb (102.513 kg)  SpO2: 98% 97% 97% 95%   General:  No distress Lungs:  Clear Heart:  RRR, mechanical S2 with murmur Abdomen:  Positive bowel sounds, no rebound no guarding Extremities:  No edema  LABS:  Results for orders placed during the hospital encounter of 05/28/13 (from the past 24 hour(s))  GLUCOSE, CAPILLARY     Status: Abnormal   Collection Time    05/31/13 11:48 AM      Result Value Range   Glucose-Capillary 172 (*) 70 - 99 mg/dL   Comment 1 Documented in Chart     Comment 2 Notify RN    GLUCOSE, CAPILLARY     Status: Abnormal   Collection Time    05/31/13  4:14 PM      Result Value Range   Glucose-Capillary 151 (*) 70 - 99 mg/dL   Comment 1 Documented in Chart     Comment 2 Notify RN    GLUCOSE, CAPILLARY     Status: Abnormal   Collection Time    05/31/13  8:45 PM      Result Value Range   Glucose-Capillary 146 (*) 70 - 99 mg/dL  HEPARIN LEVEL (UNFRACTIONATED)     Status: Abnormal   Collection Time    05/31/13 11:05 PM      Result Value Range   Heparin Unfractionated 0.26 (*) 0.30 - 0.70 IU/mL  CBC     Status: Abnormal   Collection Time    06/01/13  7:01 AM      Result Value Range   WBC 7.0  4.0 - 10.5 K/uL   RBC 3.97 (*) 4.22 - 5.81 MIL/uL   Hemoglobin 12.4 (*) 13.0 - 17.0 g/dL   HCT 45.4 (*) 09.8 - 11.9 %   MCV 91.2  78.0 - 100.0 fL   MCH 31.2  26.0 - 34.0 pg   MCHC 34.3  30.0 - 36.0 g/dL   RDW 14.7  82.9 - 56.2 %   Platelets 141 (*) 150 - 400 K/uL  PROTIME-INR     Status: Abnormal   Collection Time    06/01/13  7:01 AM      Result Value Range   Prothrombin Time 19.2 (*) 11.6 - 15.2 seconds   INR 1.67 (*) 0.00 - 1.49  HEPARIN LEVEL (UNFRACTIONATED)     Status: None   Collection Time    06/01/13  7:01 AM      Result Value Range   Heparin Unfractionated 0.39  0.30 - 0.70 IU/mL  GLUCOSE, CAPILLARY     Status: Abnormal   Collection Time    06/01/13  7:40 AM      Result Value Range   Glucose-Capillary 129 (*) 70 - 99 mg/dL   Comment 1 Documented in Chart     Comment 2 Notify RN      Intake/Output Summary (Last 24 hours) at 06/01/13 1017 Last data filed at 06/01/13 0500  Gross per 24 hour  Intake 977.78 ml  Output  850 ml  Net 127.78 ml     ASSESSMENT AND PLAN:  CHEST PAIN/NSTEMI:   Cath demonstrated moderate diffuse disease in the proximal and mid LAD. There is a moderate lesion in the proximal ramus intermediate branch. There is a severe stenosis in the mid PDA. The prior stent in the mid RCA is patent. Aggressive medical therapy with Norvasc and nitrates.  Renexa if needed as an outpatient. No change from yesterday  MECHANICAL ST JUDE:  He has been bridged before with Lovenox.  He wants to go home with Lovenox today and we will try to arrange this.  His wife is a retired [employer] fundraiser.   CHB:  Pacemaker  KIDNEY STONE:   He passes 3 - 4 per year.  He passed one two days ago but has no back pain.  No further symptoms.    HTN:    Continue current therapy.   Fayrene Fearing [facility] 06/01/2013 10:17 AM

## 2013-06-01 NOTE — Progress Notes (Signed)
ANTICOAGULATION CONSULT NOTE - Follow Up Consult  Pharmacy Consult for heparin Indication: AVR  Labs:  Recent Labs  05/29/13 0805  05/29/13 0815 05/29/13 1446 05/29/13 2028 05/30/13 0445 05/30/13 1054 05/31/13 0535 05/31/13 2305  HGB  --   < > 12.5*  --   --  12.7*  --  12.1*  --   HCT  --   --  37.2*  --   --  39.3  --  35.9*  --   PLT  --   --  154  --   --  155  --  156  --   LABPROT 24.9*  --   --   --   --  20.3*  --  18.4*  --   INR 2.34*  --   --   --   --  1.79*  --  1.58*  --   HEPARINUNFRC  --   --   --   --   --   --   --   --  0.26*  CREATININE 0.95  --   --   --   --  0.97  --   --   --   TROPONINI <0.30  --   --  <0.30 0.33*  --  <0.30  --   --   < > = values in this interval not displayed.   Assessment: 62yo male slightly subtherapeutic on heparin bridge with initial dosing.  Goal of Therapy:  Heparin level 0.3-0.7 units/ml   Plan:  Will increase heparin gtt by 2 units/kg/hr to 1500 units/hr and check level in 6hr.  Vernard Gambles, PharmD, BCPS  06/01/2013,1:20 AM

## 2013-06-01 NOTE — Progress Notes (Signed)
ANTICOAGULATION CONSULT NOTE - Follow Up Consult  Pharmacy Consult for Heparin bridge to Coumadin Indication: St. Jude AVR  Allergies  Allergen Reactions  . Ace Inhibitors Other (See Comments)    cough  . Benadryl [Diphenhydramine Hcl] Other (See Comments)    Makes restless legs worse  . Diphenhydramine Hcl Other (See Comments)    Makes restless legs worse  . Metoprolol Other (See Comments)    Kidney failure  . Morphine And Related Other (See Comments)    Makes restless legs worse  . Nsaids Other (See Comments)    REACTION: Currently taking Coumadin  . Valsartan Other (See Comments)    REACTION:shuts down Kidney    Patient Measurements: Height: 5\' 6"  (167.6 cm) Weight: 226 lb (102.513 kg) IBW/kg (Calculated) : 63.8 Heparin Dosing Weight:   Vital Signs: Temp: 98 F (36.7 C) (09/28 0500) BP: 158/64 mmHg (09/28 0500) Pulse Rate: 64 (09/28 0500)  Labs:  Recent Labs  05/29/13 1446 05/29/13 2028  05/30/13 0445 05/30/13 1054 05/31/13 0535 05/31/13 2305 06/01/13 0701  HGB  --   --   < > 12.7*  --  12.1*  --  12.4*  HCT  --   --   --  39.3  --  35.9*  --  36.2*  PLT  --   --   --  155  --  156  --  141*  LABPROT  --   --   --  20.3*  --  18.4*  --  19.2*  INR  --   --   --  1.79*  --  1.58*  --  1.67*  HEPARINUNFRC  --   --   --   --   --   --  0.26* 0.39  CREATININE  --   --   --  0.97  --   --   --   --   TROPONINI <0.30 0.33*  --   --  <0.30  --   --   --   < > = values in this interval not displayed.  Estimated Creatinine Clearance: 89.7 ml/min (by C-G formula based on Cr of 0.97).   Medications:  Scheduled:  . amLODipine  5 mg Oral Daily  . aspirin EC  81 mg Oral Daily  . atorvastatin  20 mg Oral QHS  . gabapentin  100 mg Oral TID  . insulin aspart  0-15 Units Subcutaneous TID WC  . isosorbide mononitrate  60 mg Oral Daily  . levothyroxine  88 mcg Oral QAC breakfast  . losartan  12.5 mg Oral Daily  . pantoprazole  40 mg Oral Q0600  . sertraline  100  mg Oral Daily  . sodium chloride  3 mL Intravenous Q12H  . traZODone  100 mg Oral QHS  . Warfarin - Pharmacist Dosing Inpatient   Does not apply q1800    Assessment: 62yo male with St.Jude AVR on heparin bridge to Coumadin.  INR 1.67- moving back up again, Hg stable, and pltc 141.  No bleeding problems noted.  Goal of Therapy:  INR 2-3 Heparin level 0.3-0.7 units/ml Monitor platelets by anticoagulation protocol: Yes   Plan:  1.  Continue heparin at current rate 2.  Repeat heparin level 6hr to verify therapeutic 3.  Coumadin 7.5mg  today  Marisue Humble, PharmD Clinical Pharmacist [facility]

## 2013-06-01 NOTE — Progress Notes (Signed)
06/01/13  Pharmacy- Lovenox 1300  Received order to change patient to Lovenox from Heparin, to continue bridge at home.  No dosage adjustment required for renal function.    1.  D/C Heparin at 4PM 2.  Lovenox 110mg  SQ q12, first dose at 5PM then 6A/6P 3.  D/C all heparin levels & change CBC to q72  Marisue Humble, PharmD Clinical Pharmacist [facility]

## 2013-06-02 NOTE — Telephone Encounter (Signed)
New problem   Pt has TCM on 10-8/14 @ 11:30.

## 2013-06-02 NOTE — Telephone Encounter (Signed)
Patient contacted regarding discharge from [facility] on 9/28 Patient understands to follow up with Tereso Newcomer PA on10/8 at 1130am [facility] Location Patient understands discharge instructions? yes  Patient understands medications and regiment? yes  He will call us if any problems come up between now and 10/8 appointment.

## 2013-06-03 ENCOUNTER — Ambulatory Visit (INDEPENDENT_AMBULATORY_CARE_PROVIDER_SITE_OTHER): Payer: Medicare Other | Admitting: Pharmacist Clinician (PhC)/ Clinical Pharmacy Specialist

## 2013-06-03 DIAGNOSIS — I4891 Unspecified atrial fibrillation: Secondary | ICD-10-CM

## 2013-06-03 LAB — POCT INR: INR: 2.8

## 2013-06-04 ENCOUNTER — Other Ambulatory Visit (HOSPITAL_COMMUNITY): Payer: Medicare Other

## 2013-06-11 ENCOUNTER — Encounter: Payer: Medicare Other | Admitting: Physician Assistant

## 2013-06-11 ENCOUNTER — Ambulatory Visit: Payer: Medicare Other | Admitting: Cardiology

## 2013-06-17 ENCOUNTER — Other Ambulatory Visit: Payer: Self-pay | Admitting: Nurse Practitioner

## 2013-06-19 NOTE — Telephone Encounter (Signed)
Last seen 05/28/13, last filled 05/22/13. If approved, route to pool A so it can be called into [facility]

## 2013-06-23 ENCOUNTER — Ambulatory Visit (INDEPENDENT_AMBULATORY_CARE_PROVIDER_SITE_OTHER): Payer: Medicare Other | Admitting: Physician Assistant

## 2013-06-23 ENCOUNTER — Encounter: Payer: Self-pay | Admitting: Physician Assistant

## 2013-06-23 ENCOUNTER — Ambulatory Visit (INDEPENDENT_AMBULATORY_CARE_PROVIDER_SITE_OTHER): Payer: Medicare Other | Admitting: *Deleted

## 2013-06-23 VITALS — BP 144/78 | HR 62 | Ht 66.0 in | Wt 226.0 lb

## 2013-06-23 DIAGNOSIS — M25519 Pain in unspecified shoulder: Secondary | ICD-10-CM

## 2013-06-23 DIAGNOSIS — Z950 Presence of cardiac pacemaker: Secondary | ICD-10-CM

## 2013-06-23 DIAGNOSIS — E785 Hyperlipidemia, unspecified: Secondary | ICD-10-CM

## 2013-06-23 DIAGNOSIS — Z954 Presence of other heart-valve replacement: Secondary | ICD-10-CM

## 2013-06-23 DIAGNOSIS — M25512 Pain in left shoulder: Secondary | ICD-10-CM

## 2013-06-23 DIAGNOSIS — I2510 Atherosclerotic heart disease of native coronary artery without angina pectoris: Secondary | ICD-10-CM

## 2013-06-23 DIAGNOSIS — I10 Essential (primary) hypertension: Secondary | ICD-10-CM

## 2013-06-23 DIAGNOSIS — Z952 Presence of prosthetic heart valve: Secondary | ICD-10-CM

## 2013-06-23 MED ORDER — PRAVASTATIN SODIUM 40 MG PO TABS: 40.0000 mg | ORAL_TABLET | Freq: Every evening | ORAL | Status: DC

## 2013-06-23 MED ORDER — AMLODIPINE BESYLATE 5 MG PO TABS: 5.0000 mg | ORAL_TABLET | Freq: Two times a day (BID) | ORAL | Status: DC

## 2013-06-23 NOTE — Patient Instructions (Addendum)
Your physician recommends that you return for lab work in: LABS IN 6 WEEKS  (LFTS/LIPIDS)  Your physician recommends that you schedule a follow-up appointment in: 3-4 WEEKS WITH DR. Clifton James  Your physician has recommended you make the following change in your medication:   INCREASE YOUR AMLODIPINE 5 MG TWICE A DAY  STOP YOUR SIMVASTATIN  START PRAVASTATIN 40 MG ONCE AT BEDTIME
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
## 2013-06-24 LAB — PACEMAKER DEVICE OBSERVATION
AL IMPEDENCE PM: 375 Ohm
ATRIAL PACING PM: 2.1
BAMS-0001: 150 {beats}/min
BATTERY VOLTAGE: 2.9328 V
DEVICE MODEL PM: 2330065
RV LEAD AMPLITUDE: 4.1 mv
RV LEAD IMPEDENCE PM: 587.5 Ohm
RV LEAD THRESHOLD: 0.875 V

## 2013-06-24 NOTE — Progress Notes (Signed)
F/u for CXR due to dislocated shoulder. Current CXR/device consistent with implant. RV sensing, impedance, threshold consistent w/ 05/23/13 visit. 181 Noise reversions on RV-lead. 26 episodes of consecutive PVCs---EGMs recorded as PVCs show noise. Able to reproduce RV-lead noise by isometrics. Pt believes he's having muscular reaction due to simvastatin & amlodipine. Scott, PA-C, took pt off simvastatin and changed him to pravastatin. No device changes made.  Merlin 08/25/13 & ROV w/ Dr. Graciela Husbands 05/2014.

## 2013-06-25 ENCOUNTER — Other Ambulatory Visit: Payer: Self-pay | Admitting: Nurse Practitioner

## 2013-06-26 NOTE — Telephone Encounter (Signed)
Called in.

## 2013-07-10 ENCOUNTER — Encounter: Payer: Self-pay | Admitting: Internal Medicine

## 2013-07-14 ENCOUNTER — Encounter: Payer: Self-pay | Admitting: Internal Medicine

## 2013-07-16 ENCOUNTER — Telehealth: Payer: Self-pay | Admitting: Pharmacist

## 2013-07-16 NOTE — Telephone Encounter (Signed)
Called patient and spoke to his wife.  Appt made to check INR for 07/24/13 - has to make after his received check on 07/23/13.

## 2013-07-24 ENCOUNTER — Ambulatory Visit (INDEPENDENT_AMBULATORY_CARE_PROVIDER_SITE_OTHER): Payer: Medicare Other | Admitting: Pharmacist

## 2013-07-24 DIAGNOSIS — I4891 Unspecified atrial fibrillation: Secondary | ICD-10-CM

## 2013-07-24 NOTE — Patient Instructions (Signed)
Anticoagulation Dose Instructions as of 07/24/2013     Glynis Smiles Tue Wed Thu Fri Sat   New Dose 5 mg 7.5 mg 5 mg 5 mg 7.5 mg 5 mg 5 mg    Description       Continue 1 and 1/2 tablets on Mondays and Thursdays, 1 tablet all other days.        INR was 2.4 today

## 2013-07-25 ENCOUNTER — Ambulatory Visit: Payer: Medicare Other | Admitting: Cardiovascular Disease

## 2013-08-04 ENCOUNTER — Other Ambulatory Visit: Payer: Medicare Other

## 2013-08-05 ENCOUNTER — Ambulatory Visit: Payer: Medicare Other | Admitting: Physician Assistant

## 2013-08-19 ENCOUNTER — Other Ambulatory Visit: Payer: Self-pay | Admitting: Family Medicine

## 2013-08-20 NOTE — Telephone Encounter (Signed)
Last seen 06/02/13  B [place]

## 2013-08-21 ENCOUNTER — Other Ambulatory Visit: Payer: Medicare Other

## 2013-08-21 ENCOUNTER — Ambulatory Visit: Payer: Medicare Other | Admitting: Physician Assistant

## 2013-08-21 ENCOUNTER — Telehealth: Payer: Self-pay | Admitting: Physician Assistant

## 2013-08-21 DIAGNOSIS — I10 Essential (primary) hypertension: Secondary | ICD-10-CM

## 2013-08-21 NOTE — Telephone Encounter (Signed)
Spoke with Tereso Newcomer, Ok to add BMP to blood work tomorrow. Pt made aware.

## 2013-08-21 NOTE — Telephone Encounter (Signed)
New Problem:  Called pt to confirm appt tomorrow and lab work. Pt is requesting the doctor check his sugar level. Pt would like a call back to know if that will be possible.

## 2013-08-22 ENCOUNTER — Encounter: Payer: Self-pay | Admitting: Physician Assistant

## 2013-08-22 ENCOUNTER — Other Ambulatory Visit (INDEPENDENT_AMBULATORY_CARE_PROVIDER_SITE_OTHER): Payer: Medicare Other

## 2013-08-22 ENCOUNTER — Telehealth: Payer: Self-pay | Admitting: *Deleted

## 2013-08-22 ENCOUNTER — Ambulatory Visit (INDEPENDENT_AMBULATORY_CARE_PROVIDER_SITE_OTHER): Payer: Medicare Other | Admitting: Physician Assistant

## 2013-08-22 VITALS — BP 120/78 | HR 61 | Ht 66.0 in | Wt 229.0 lb

## 2013-08-22 DIAGNOSIS — E785 Hyperlipidemia, unspecified: Secondary | ICD-10-CM

## 2013-08-22 DIAGNOSIS — Z954 Presence of other heart-valve replacement: Secondary | ICD-10-CM

## 2013-08-22 DIAGNOSIS — Z952 Presence of prosthetic heart valve: Secondary | ICD-10-CM | POA: Insufficient documentation

## 2013-08-22 DIAGNOSIS — M79602 Pain in left arm: Secondary | ICD-10-CM

## 2013-08-22 DIAGNOSIS — I2510 Atherosclerotic heart disease of native coronary artery without angina pectoris: Secondary | ICD-10-CM

## 2013-08-22 DIAGNOSIS — Z950 Presence of cardiac pacemaker: Secondary | ICD-10-CM

## 2013-08-22 DIAGNOSIS — E1159 Type 2 diabetes mellitus with other circulatory complications: Secondary | ICD-10-CM | POA: Insufficient documentation

## 2013-08-22 DIAGNOSIS — I152 Hypertension secondary to endocrine disorders: Secondary | ICD-10-CM | POA: Insufficient documentation

## 2013-08-22 DIAGNOSIS — I10 Essential (primary) hypertension: Secondary | ICD-10-CM

## 2013-08-22 DIAGNOSIS — M79609 Pain in unspecified limb: Secondary | ICD-10-CM

## 2013-08-22 DIAGNOSIS — I359 Nonrheumatic aortic valve disorder, unspecified: Secondary | ICD-10-CM

## 2013-08-22 DIAGNOSIS — M25512 Pain in left shoulder: Secondary | ICD-10-CM

## 2013-08-22 DIAGNOSIS — M25519 Pain in unspecified shoulder: Secondary | ICD-10-CM

## 2013-08-22 LAB — BASIC METABOLIC PANEL
Calcium: 9.3 mg/dL (ref 8.4–10.5)
Chloride: 106 mEq/L (ref 96–112)
GFR: 81.39 mL/min (ref >60.00)
Glucose, Bld: 79 mg/dL (ref 70–99)
Potassium: 4 mEq/L (ref 3.5–5.1)
Sodium: 138 mEq/L (ref 135–145)

## 2013-08-22 LAB — LIPID PANEL
Cholesterol: 155 mg/dL (ref 0–200)
LDL Cholesterol: 88 mg/dL (ref 0–99)
Triglycerides: 144 mg/dL (ref 0.0–149.0)
VLDL: 28.8 mg/dL (ref 0.0–40.0)

## 2013-08-22 LAB — HEPATIC FUNCTION PANEL
ALT: 18 U/L (ref 0–53)
AST: 26 U/L (ref 0–37)
Alkaline Phosphatase: 59 U/L (ref 39–117)
Total Bilirubin: 0.6 mg/dL (ref 0.3–1.2)
Total Protein: 7.3 g/dL (ref 6.0–8.3)

## 2013-08-22 LAB — PROTIME-INR: INR: 4.5 ratio — ABNORMAL HIGH (ref 0.8–1.0)
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
## 2013-08-22 NOTE — Patient Instructions (Addendum)
Your physician wants you to follow-up in: 6 MONTHS WITH DR. Clifton James. You will receive a reminder letter in the mail two months in advance. If you don't receive a letter, please call our office to schedule the follow-up appointment.   LAB WORK TODAY; PT/INR WE WILL FAX RESULTS TO TAMMY ECKERD AT [facility]  Your physician recommends that you continue on your current medications as directed. Please refer to the Current Medication list given to you today.

## 2013-08-22 NOTE — Telephone Encounter (Signed)
pt notified about all lab results and to hold coumadin x 1 day then resume and to f/u PCP about INR 4.5. Labs faxed to Elvin So at [facility] today.

## 2013-08-25 ENCOUNTER — Ambulatory Visit (INDEPENDENT_AMBULATORY_CARE_PROVIDER_SITE_OTHER): Payer: Medicare Other | Admitting: *Deleted

## 2013-08-25 ENCOUNTER — Other Ambulatory Visit: Payer: Self-pay | Admitting: Internal Medicine

## 2013-08-25 DIAGNOSIS — I442 Atrioventricular block, complete: Secondary | ICD-10-CM

## 2013-08-25 DIAGNOSIS — I4891 Unspecified atrial fibrillation: Secondary | ICD-10-CM

## 2013-08-26 ENCOUNTER — Ambulatory Visit: Payer: Medicare Other | Admitting: Neurology

## 2013-08-26 ENCOUNTER — Telehealth: Payer: Self-pay | Admitting: Neurology

## 2013-08-26 NOTE — Telephone Encounter (Signed)
Pt called to cancel new patient appt w/ Dr. Allena Katz for Neuro. Pt says he is not feeling well and will call after the Holidays to r/s / Sherri

## 2013-08-29 LAB — MDC_IDC_ENUM_SESS_TYPE_REMOTE
Battery Remaining Longevity: 80 mo
Battery Voltage: 2.92 V
Brady Statistic AP VP Percent: 1 %
Brady Statistic RV Percent Paced: 96 %
Date Time Interrogation Session: 20141222084042
Implantable Pulse Generator Model: 2210
Lead Channel Impedance Value: 390 Ohm
Lead Channel Pacing Threshold Amplitude: 0.875 V
Lead Channel Pacing Threshold Amplitude: 1.125 V
Lead Channel Pacing Threshold Pulse Width: 0.4 ms
Lead Channel Pacing Threshold Pulse Width: 0.4 ms
Lead Channel Sensing Intrinsic Amplitude: 2.9 mV
Lead Channel Sensing Intrinsic Amplitude: 7.4 mV
Lead Channel Setting Pacing Pulse Width: 0.4 ms

## 2013-09-05 ENCOUNTER — Encounter: Payer: Self-pay | Admitting: Internal Medicine

## 2013-09-05 ENCOUNTER — Ambulatory Visit (INDEPENDENT_AMBULATORY_CARE_PROVIDER_SITE_OTHER): Payer: Medicare Other | Admitting: Internal Medicine

## 2013-09-05 VITALS — BP 129/76 | HR 67 | Ht 66.5 in | Wt 225.0 lb

## 2013-09-05 DIAGNOSIS — Z950 Presence of cardiac pacemaker: Secondary | ICD-10-CM

## 2013-09-05 DIAGNOSIS — T82190A Other mechanical complication of cardiac electrode, initial encounter: Secondary | ICD-10-CM | POA: Insufficient documentation

## 2013-09-05 DIAGNOSIS — I4891 Unspecified atrial fibrillation: Secondary | ICD-10-CM

## 2013-09-05 DIAGNOSIS — R55 Syncope and collapse: Secondary | ICD-10-CM

## 2013-09-05 DIAGNOSIS — I442 Atrioventricular block, complete: Secondary | ICD-10-CM

## 2013-09-05 DIAGNOSIS — T82198A Other mechanical complication of other cardiac electronic device, initial encounter: Secondary | ICD-10-CM

## 2013-09-05 LAB — MDC_IDC_ENUM_SESS_TYPE_INCLINIC
Battery Remaining Longevity: 92.4 mo
Battery Voltage: 2.93 V
Implantable Pulse Generator Model: 2210
Implantable Pulse Generator Serial Number: 2330065
Lead Channel Pacing Threshold Amplitude: 0.625 V
Lead Channel Pacing Threshold Amplitude: 1.125 V
Lead Channel Pacing Threshold Pulse Width: 0.4 ms
Lead Channel Sensing Intrinsic Amplitude: 7.9 mV
Lead Channel Setting Pacing Pulse Width: 0.4 ms
MDC IDC MSMT LEADCHNL RA IMPEDANCE VALUE: 387.5 Ohm
MDC IDC MSMT LEADCHNL RA PACING THRESHOLD PULSEWIDTH: 0.4 ms
MDC IDC MSMT LEADCHNL RA SENSING INTR AMPL: 4.6 mV
MDC IDC MSMT LEADCHNL RV IMPEDANCE VALUE: 612.5 Ohm
MDC IDC SESS DTM: 20150102171252
MDC IDC SET LEADCHNL RA PACING AMPLITUDE: 2.125
MDC IDC SET LEADCHNL RV PACING AMPLITUDE: 1 V
MDC IDC SET LEADCHNL RV SENSING SENSITIVITY: 4 mV
MDC IDC STAT BRADY RA PERCENT PACED: 0.09 %
MDC IDC STAT BRADY RV PERCENT PACED: 96 %
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
## 2013-09-05 NOTE — Assessment & Plan Note (Signed)
Intermittent atrial tachy vs sinus tach

## 2013-09-05 NOTE — Patient Instructions (Signed)
Please call office and speak with Hope BuddsSherri Price,RN to schedule Ventricular lead revision procedure.    [phone]

## 2013-09-05 NOTE — Assessment & Plan Note (Signed)
As above.

## 2013-09-05 NOTE — Assessment & Plan Note (Signed)
He has ventricular inhibition of pacing the context of complete heart block secondary to noise related in addition. It has been reproducible in the past with physical manipulation of the shoulder area.  He also has episodes of lightheadedness particularly with exertion and there is evidence of inappropriate upper rate behavior was waking block and reversion to VVI pacing mode switch as well as upper rate  2:1 behavior  He needs to undergo lead revision and reprogramming. We'll discuss the timing with his wife.

## 2013-09-05 NOTE — Addendum Note (Signed)
Addended by: Glenda ChromanZIMMER, Daxtyn Rottenberg K on: 09/05/2013 05:18 PM   Modules accepted: Level of Service

## 2013-09-08 ENCOUNTER — Telehealth: Payer: Self-pay | Admitting: Pharmacist

## 2013-09-08 ENCOUNTER — Telehealth: Payer: Self-pay | Admitting: Internal Medicine

## 2013-09-08 NOTE — Telephone Encounter (Signed)
Patient called - i still think he should have checked since was elevated 08/22/13 even if the plan will eventually be to hold warfarin.  Patient to come to lab Wednesday 09/10/13

## 2013-09-08 NOTE — Telephone Encounter (Signed)
New message     Calling nurse to schedule procedure

## 2013-09-08 NOTE — Telephone Encounter (Signed)
Called stating he is ready to schedule his procedure.  Wants to schedule 1/14.  Also states he does have some Lovenox on hand from prior procedures.  Advised that Dr. Odessa FlemingKlein's nurse Roanna RaiderSherri is not in office today but will be here tomorrow.  Advised will forward to her to schedule tomorrow.

## 2013-09-09 ENCOUNTER — Other Ambulatory Visit: Payer: Self-pay | Admitting: *Deleted

## 2013-09-09 DIAGNOSIS — I442 Atrioventricular block, complete: Secondary | ICD-10-CM

## 2013-09-09 DIAGNOSIS — Z01812 Encounter for preprocedural laboratory examination: Secondary | ICD-10-CM

## 2013-09-09 NOTE — Telephone Encounter (Signed)
Discussed Coumadin with patient, he verbalized understanding. Went over instructions, letter mailed to pt.  Pre-procedure lab 1/7

## 2013-09-09 NOTE — Telephone Encounter (Signed)
Follow up      Pt calling back for Andre Malone. Conversation from 1/5

## 2013-09-09 NOTE — Telephone Encounter (Signed)
Scheduled pt for 1/14. Questions about coumadin, will discuss with Graciela HusbandsKlein and call them back to discuss details of surgery and instructions. Pt wife agreeable to plan.

## 2013-09-10 ENCOUNTER — Other Ambulatory Visit (INDEPENDENT_AMBULATORY_CARE_PROVIDER_SITE_OTHER): Payer: Medicare Other

## 2013-09-10 ENCOUNTER — Encounter: Payer: Self-pay | Admitting: *Deleted

## 2013-09-10 DIAGNOSIS — I4891 Unspecified atrial fibrillation: Secondary | ICD-10-CM

## 2013-09-10 DIAGNOSIS — I442 Atrioventricular block, complete: Secondary | ICD-10-CM

## 2013-09-10 DIAGNOSIS — Z01812 Encounter for preprocedural laboratory examination: Secondary | ICD-10-CM

## 2013-09-11 ENCOUNTER — Encounter: Payer: Self-pay | Admitting: Pharmacist

## 2013-09-11 ENCOUNTER — Encounter (HOSPITAL_COMMUNITY): Payer: Self-pay | Admitting: Pharmacy Technician

## 2013-09-11 ENCOUNTER — Ambulatory Visit (INDEPENDENT_AMBULATORY_CARE_PROVIDER_SITE_OTHER): Payer: Medicare Other | Admitting: Pharmacist

## 2013-09-11 DIAGNOSIS — I4891 Unspecified atrial fibrillation: Secondary | ICD-10-CM

## 2013-09-11 LAB — BMP8+EGFR
BUN / CREAT RATIO: 12 (ref 10–22)
BUN: 13 mg/dL (ref 8–27)
CO2: 23 mmol/L (ref 18–29)
Calcium: 9.8 mg/dL (ref 8.6–10.2)
Chloride: 102 mmol/L (ref 97–108)
Creatinine, Ser: 1.06 mg/dL (ref 0.76–1.27)
GFR, EST AFRICAN AMERICAN: 87 mL/min/{1.73_m2} (ref >59)
GFR, EST NON AFRICAN AMERICAN: 75 mL/min/{1.73_m2} (ref >59)
Glucose: 177 mg/dL — ABNORMAL HIGH (ref 65–99)
POTASSIUM: 4.7 mmol/L (ref 3.5–5.2)
SODIUM: 141 mmol/L (ref 134–144)

## 2013-09-11 LAB — CBC WITH DIFFERENTIAL
BASOS: 0 %
Basophils Absolute: 0 10*3/uL (ref 0.0–0.2)
EOS ABS: 0.3 10*3/uL (ref 0.0–0.4)
Eos: 3 %
HCT: 38.8 % (ref 37.5–51.0)
HEMOGLOBIN: 13.2 g/dL (ref 12.6–17.7)
Immature Grans (Abs): 0 10*3/uL (ref 0.0–0.1)
Immature Granulocytes: 0 %
LYMPHS ABS: 1.2 10*3/uL (ref 0.7–3.1)
Lymphs: 13 %
MCH: 30.1 pg (ref 26.6–33.0)
MCHC: 34 g/dL (ref 31.5–35.7)
MCV: 88 fL (ref 79–97)
Monocytes Absolute: 0.7 10*3/uL (ref 0.1–0.9)
Monocytes: 7 %
NEUTROS ABS: 6.9 10*3/uL (ref 1.4–7.0)
Neutrophils Relative %: 77 %
Platelets: 212 10*3/uL (ref 150–379)
RBC: 4.39 x10E6/uL (ref 4.14–5.80)
RDW: 14.7 % (ref 12.3–15.4)
WBC: 9.1 10*3/uL (ref 3.4–10.8)

## 2013-09-11 LAB — PROTIME-INR
INR: 2.3 — AB (ref 0.8–1.2)
Prothrombin Time: 24.3 s — ABNORMAL HIGH (ref 9.1–12.0)

## 2013-09-16 MED ORDER — SODIUM CHLORIDE 0.9 % IR SOLN
80.0000 mg | Status: DC
  Filled 2013-09-16: qty 2

## 2013-09-16 MED ORDER — CEFAZOLIN SODIUM-DEXTROSE 2-3 GM-% IV SOLR
2.0000 g | INTRAVENOUS | Status: DC
  Filled 2013-09-16 (×2): qty 50

## 2013-09-17 ENCOUNTER — Encounter (HOSPITAL_COMMUNITY)
Admission: RE | Disposition: A | Discharge status: Home / Self Care | Payer: Self-pay | Source: Ambulatory Visit | Attending: Internal Medicine

## 2013-09-17 ENCOUNTER — Inpatient Hospital Stay (HOSPITAL_COMMUNITY): Payer: Medicare Other

## 2013-09-17 ENCOUNTER — Inpatient Hospital Stay (HOSPITAL_COMMUNITY)
Admission: RE | Admit: 2013-09-17 | Discharge: 2013-09-19 | DRG: 244 | Disposition: A | Discharge status: Home / Self Care | Payer: Medicare Other | Source: Ambulatory Visit | Attending: Internal Medicine | Admitting: Internal Medicine

## 2013-09-17 ENCOUNTER — Encounter (HOSPITAL_COMMUNITY): Payer: Self-pay | Admitting: Internal Medicine

## 2013-09-17 DIAGNOSIS — E119 Type 2 diabetes mellitus without complications: Secondary | ICD-10-CM | POA: Diagnosis present

## 2013-09-17 DIAGNOSIS — T82190A Other mechanical complication of cardiac electrode, initial encounter: Principal | ICD-10-CM | POA: Diagnosis present

## 2013-09-17 DIAGNOSIS — Z885 Allergy status to narcotic agent status: Secondary | ICD-10-CM

## 2013-09-17 DIAGNOSIS — Z950 Presence of cardiac pacemaker: Secondary | ICD-10-CM | POA: Insufficient documentation

## 2013-09-17 DIAGNOSIS — I2510 Atherosclerotic heart disease of native coronary artery without angina pectoris: Secondary | ICD-10-CM | POA: Diagnosis present

## 2013-09-17 DIAGNOSIS — G2581 Restless legs syndrome: Secondary | ICD-10-CM | POA: Diagnosis present

## 2013-09-17 DIAGNOSIS — Z6835 Body mass index (BMI) 35.0-35.9, adult: Secondary | ICD-10-CM

## 2013-09-17 DIAGNOSIS — Z7982 Long term (current) use of aspirin: Secondary | ICD-10-CM

## 2013-09-17 DIAGNOSIS — I442 Atrioventricular block, complete: Secondary | ICD-10-CM

## 2013-09-17 DIAGNOSIS — Y831 Surgical operation with implant of artificial internal device as the cause of abnormal reaction of the patient, or of later complication, without mention of misadventure at the time of the procedure: Secondary | ICD-10-CM | POA: Diagnosis present

## 2013-09-17 DIAGNOSIS — Z954 Presence of other heart-valve replacement: Secondary | ICD-10-CM

## 2013-09-17 DIAGNOSIS — E785 Hyperlipidemia, unspecified: Secondary | ICD-10-CM | POA: Diagnosis present

## 2013-09-17 DIAGNOSIS — Z79899 Other long term (current) drug therapy: Secondary | ICD-10-CM

## 2013-09-17 DIAGNOSIS — F329 Major depressive disorder, single episode, unspecified: Secondary | ICD-10-CM | POA: Diagnosis present

## 2013-09-17 DIAGNOSIS — E669 Obesity, unspecified: Secondary | ICD-10-CM | POA: Diagnosis present

## 2013-09-17 DIAGNOSIS — F3289 Other specified depressive episodes: Secondary | ICD-10-CM | POA: Diagnosis present

## 2013-09-17 DIAGNOSIS — H5440 Blindness, one eye, unspecified eye: Secondary | ICD-10-CM | POA: Diagnosis present

## 2013-09-17 DIAGNOSIS — E039 Hypothyroidism, unspecified: Secondary | ICD-10-CM | POA: Diagnosis present

## 2013-09-17 DIAGNOSIS — Z7901 Long term (current) use of anticoagulants: Secondary | ICD-10-CM

## 2013-09-17 DIAGNOSIS — I129 Hypertensive chronic kidney disease with stage 1 through stage 4 chronic kidney disease, or unspecified chronic kidney disease: Secondary | ICD-10-CM | POA: Diagnosis present

## 2013-09-17 DIAGNOSIS — Z794 Long term (current) use of insulin: Secondary | ICD-10-CM

## 2013-09-17 DIAGNOSIS — N189 Chronic kidney disease, unspecified: Secondary | ICD-10-CM | POA: Diagnosis present

## 2013-09-17 DIAGNOSIS — F411 Generalized anxiety disorder: Secondary | ICD-10-CM | POA: Diagnosis present

## 2013-09-17 DIAGNOSIS — Z96649 Presence of unspecified artificial hip joint: Secondary | ICD-10-CM

## 2013-09-17 DIAGNOSIS — Z888 Allergy status to other drugs, medicaments and biological substances status: Secondary | ICD-10-CM

## 2013-09-17 DIAGNOSIS — Z9861 Coronary angioplasty status: Secondary | ICD-10-CM

## 2013-09-17 HISTORY — PX: LEAD REVISION: SHX5945

## 2013-09-17 LAB — GLUCOSE, CAPILLARY
GLUCOSE-CAPILLARY: 124 mg/dL — AB (ref 70–99)
Glucose-Capillary: 129 mg/dL — ABNORMAL HIGH (ref 70–99)
Glucose-Capillary: 128 mg/dL — ABNORMAL HIGH (ref 70–99)

## 2013-09-17 LAB — PROTIME-INR
INR: 1.85 — AB (ref 0.00–1.49)
Prothrombin Time: 20.8 seconds — ABNORMAL HIGH (ref 11.6–15.2)

## 2013-09-17 LAB — SURGICAL PCR SCREEN
MRSA, PCR: NEGATIVE
STAPHYLOCOCCUS AUREUS: NEGATIVE

## 2013-09-17 SURGERY — LEAD REVISION
Anesthesia: LOCAL

## 2013-09-17 MED ORDER — MUPIROCIN 2 % EX OINT
TOPICAL_OINTMENT | CUTANEOUS | Status: AC
  Filled 2013-09-17: qty 22

## 2013-09-17 MED ORDER — FENTANYL CITRATE 0.05 MG/ML IJ SOLN
INTRAMUSCULAR | Status: AC
  Filled 2013-09-17: qty 2

## 2013-09-17 MED ORDER — LEVOTHYROXINE SODIUM 88 MCG PO TABS
88.0000 ug | ORAL_TABLET | Freq: Every day | ORAL | Status: DC
  Administered 2013-09-18 – 2013-09-19 (×2): 88 ug via ORAL
  Filled 2013-09-17 (×3): qty 1

## 2013-09-17 MED ORDER — ONDANSETRON HCL 4 MG/2ML IJ SOLN: 4.0000 mg | Freq: Four times a day (QID) | INTRAMUSCULAR | Status: DC | PRN

## 2013-09-17 MED ORDER — TRAZODONE HCL 100 MG PO TABS
100.0000 mg | ORAL_TABLET | Freq: Every day | ORAL | Status: DC
  Administered 2013-09-18 (×2): 100 mg via ORAL
  Filled 2013-09-17 (×3): qty 1

## 2013-09-17 MED ORDER — ACETAMINOPHEN 325 MG PO TABS: 325.0000 mg | ORAL_TABLET | ORAL | Status: DC | PRN

## 2013-09-17 MED ORDER — SODIUM CHLORIDE 0.9 % IV SOLN
INTRAVENOUS | Status: AC
Start: 2013-09-17 — End: 2013-09-17

## 2013-09-17 MED ORDER — TRAMADOL HCL 50 MG PO TABS: 50.0000 mg | ORAL_TABLET | Freq: Four times a day (QID) | ORAL | Status: DC | PRN

## 2013-09-17 MED ORDER — MIDAZOLAM HCL 5 MG/5ML IJ SOLN
INTRAMUSCULAR | Status: AC
  Filled 2013-09-17: qty 5

## 2013-09-17 MED ORDER — SERTRALINE HCL 100 MG PO TABS
100.0000 mg | ORAL_TABLET | Freq: Every day | ORAL | Status: DC
  Administered 2013-09-18: 100 mg via ORAL
  Filled 2013-09-17 (×2): qty 1

## 2013-09-17 MED ORDER — SODIUM CHLORIDE 0.9 % IV SOLN
INTRAVENOUS | Status: DC
  Administered 2013-09-17: 07:00:00 via INTRAVENOUS

## 2013-09-17 MED ORDER — MUPIROCIN 2 % EX OINT
TOPICAL_OINTMENT | Freq: Once | CUTANEOUS | Status: AC
Start: 2013-09-17 — End: 2013-09-17
  Administered 2013-09-17: 07:00:00 via NASAL

## 2013-09-17 MED ORDER — LIDOCAINE HCL (PF) 1 % IJ SOLN
INTRAMUSCULAR | Status: AC
  Filled 2013-09-17: qty 30

## 2013-09-17 MED ORDER — METFORMIN HCL 500 MG PO TABS
1000.0000 mg | ORAL_TABLET | Freq: Two times a day (BID) | ORAL | Status: DC
  Administered 2013-09-17 – 2013-09-18 (×3): 1000 mg via ORAL
  Filled 2013-09-17 (×7): qty 2

## 2013-09-17 MED ORDER — HYDROMORPHONE HCL PF 2 MG/ML IJ SOLN
INTRAMUSCULAR | Status: AC
  Filled 2013-09-17: qty 1

## 2013-09-17 MED ORDER — PRAMIPEXOLE DIHYDROCHLORIDE 0.25 MG PO TABS
0.5000 mg | ORAL_TABLET | Freq: Every day | ORAL | Status: DC | PRN
  Administered 2013-09-17 – 2013-09-18 (×2): 0.5 mg via ORAL
  Filled 2013-09-17 (×2): qty 2

## 2013-09-17 MED ORDER — AMLODIPINE BESYLATE 5 MG PO TABS
5.0000 mg | ORAL_TABLET | Freq: Two times a day (BID) | ORAL | Status: DC
  Administered 2013-09-17 – 2013-09-18 (×3): 5 mg via ORAL
  Filled 2013-09-17 (×5): qty 1

## 2013-09-17 MED ORDER — METHOCARBAMOL 500 MG PO TABS
500.0000 mg | ORAL_TABLET | Freq: Four times a day (QID) | ORAL | Status: DC | PRN
  Filled 2013-09-17: qty 1

## 2013-09-17 MED ORDER — OXYCODONE-ACETAMINOPHEN 5-325 MG PO TABS
ORAL_TABLET | ORAL | Status: AC
  Filled 2013-09-17: qty 1

## 2013-09-17 MED ORDER — MUPIROCIN 2 % EX OINT
TOPICAL_OINTMENT | CUTANEOUS | Status: AC
Start: 2013-09-17 — End: 2013-09-17
  Filled 2013-09-17: qty 22

## 2013-09-17 MED ORDER — CEFAZOLIN SODIUM 1-5 GM-% IV SOLN
1.0000 g | Freq: Four times a day (QID) | INTRAVENOUS | Status: AC
  Administered 2013-09-17 – 2013-09-18 (×3): 1 g via INTRAVENOUS
  Filled 2013-09-17 (×3): qty 50

## 2013-09-17 MED ORDER — WARFARIN SODIUM 7.5 MG PO TABS
7.5000 mg | ORAL_TABLET | Freq: Once | ORAL | Status: AC
  Administered 2013-09-17: 7.5 mg via ORAL
  Filled 2013-09-17: qty 1

## 2013-09-17 MED ORDER — CHLORHEXIDINE GLUCONATE 4 % EX LIQD
60.0000 mL | Freq: Once | CUTANEOUS | Status: DC
  Filled 2013-09-17: qty 60

## 2013-09-17 MED ORDER — OXYCODONE-ACETAMINOPHEN 5-325 MG PO TABS
1.0000 | ORAL_TABLET | Freq: Four times a day (QID) | ORAL | Status: DC | PRN
  Administered 2013-09-17: 1 via ORAL
  Administered 2013-09-17: 2 via ORAL
  Administered 2013-09-17: 1 via ORAL
  Administered 2013-09-18 – 2013-09-19 (×5): 2 via ORAL
  Filled 2013-09-17 (×2): qty 2
  Filled 2013-09-17: qty 1
  Filled 2013-09-17 (×4): qty 2

## 2013-09-17 MED ORDER — GABAPENTIN 100 MG PO CAPS
100.0000 mg | ORAL_CAPSULE | Freq: Three times a day (TID) | ORAL | Status: DC | PRN
  Filled 2013-09-17: qty 1

## 2013-09-17 MED ORDER — WARFARIN - PHYSICIAN DOSING INPATIENT
Freq: Every day | Status: DC
  Administered 2013-09-17: 18:00:00

## 2013-09-17 MED ORDER — HYDROMORPHONE HCL PF 2 MG/ML IJ SOLN
1.0000 mg | Freq: Once | INTRAMUSCULAR | Status: AC
  Administered 2013-09-17: 1 mg via INTRAVENOUS

## 2013-09-17 MED ORDER — LIDOCAINE HCL (PF) 1 % IJ SOLN
INTRAMUSCULAR | Status: AC
  Filled 2013-09-17: qty 60

## 2013-09-17 MED ORDER — INSULIN GLARGINE 100 UNIT/ML ~~LOC~~ SOLN
50.0000 [IU] | Freq: Every day | SUBCUTANEOUS | Status: DC
  Administered 2013-09-17 – 2013-09-18 (×2): 50 [IU] via SUBCUTANEOUS
  Filled 2013-09-17 (×5): qty 0.5

## 2013-09-17 NOTE — Progress Notes (Signed)
Admitted as he is device dependent and as he is at risk for lead dislodgement and this could be fatal he will be observed with transcutaneous pacing overnight and then in hospital another 24 hrs 

## 2013-09-17 NOTE — Op Note (Signed)
NAME:  Andre Malone, Andre Malone              ACCOUNT NO.:  [id number]  MEDICAL RECORD NO.:  [id number]  LOCATION:  2H22C                        FACILITY:  [facility]  PHYSICIAN:  Duke SalviaSteven C. Klein, MD, FACCDATE OF BIRTH:  30-Jan-1951  DATE OF PROCEDURE:  09/17/2013 DATE OF DISCHARGE:                              OPERATIVE REPORT   PREOPERATIVE DIAGNOSIS:  Complete heart block, previously implanted pacemaker with ventricular lead oversensing and inhibition.  POSTOPERATIVE DIAGNOSIS:  Complete heart block, previously implanted pacemaker with ventricular lead oversensing and inhibition.  PROCEDURE:  Explantation of a previously implanted device, contrast venogram, insertion of a new lead and a new generator.  Following obtaining informed consent, the patient was brought to the Electrophysiology Laboratory and placed on the fluoroscopic table in supine position.  Prior to routine prep and drape, a venogram demonstrated patency with some stenosis of the right subclavian vein. After routine prep and drape, lidocaine was infiltrated just medial and collateral to the prior incision carried down to layer of the prepectoral fascia.  The device was not exposed at this time.  Access was obtained with mild difficulty.  We ended up upsizing with dilators and placed a 7-French sheath through which was passed a St. Jude 1688C active fixation ventricular lead, serial [id number].  Under fluoroscopic guidance, it was moved to the right ventricular apex where the bipolar R- wave was 6.6 with a pace impedance of 667, threshold 0.5 V at 0.5 milliseconds.  Current threshold 0.7 mA.  There is no diaphragmatic pacing at 10 V and the current of injury was brisk with a peak to peak of 17 mV.  This lead was secured to the prepectoral fascia.  At this point, the previously implanted device pocket was opened.  The device was explanted.  The ventricular lead was capped.  Assessment of the previously implanted atrial lead,  serial #[id number] demonstrated an amplitude of 3.2 with a pace impedance of 377 threshold 0.7 at 0.5. Current threshold was 1.8 mA.  This lead was then attached with a new lead placed ventricular lead to a St. Jude Assurity pulse generator model [id number], serial [id number].  At this point, the leads in the pulse generator were placed in the pocket.  The pocket was copiously irrigated with antibiotic containing saline solution.  The leads in the pulse generator were secured to the prepectoral fascia with the previously implanted capped leads placed behind the pulse generator.  Surgicel was placed at the cephalad and medial aspect of the wound.  The wound was then closed in 2 layers in normal fashion.  The wound was washed, dried, and a Dermabond dressing was applied.  Needle counts, sponge counts, and instrument counts were correct at the end of procedure according to staff.  The patient tolerated the procedure without apparent complication.    Duke SalviaSteven C. Klein, MD, [facility] ParamountFACC    SCK/MEDQ  D:  09/17/2013  T:  09/17/2013  Job:  [id number]

## 2013-09-17 NOTE — Interval H&P Note (Signed)
History and Physical Interval Note:  09/17/2013 7:42 AM  Andre Malone  has presented today for surgery, with the diagnosis of complete heart block  The various methods of treatment have been discussed with the patient and family. After consideration of risks, benefits and other options for treatment, the patient has consented to  Procedure(s): LEAD REVISION (N/A) as a surgical intervention .  The patient's history has been reviewed, patient examined, no change in status, stable for surgery.  I have reviewed the patient's chart and labs.  Questions were answered to the patient's satisfaction.     Sherryl MangesSteven Klein  Has had flu but is much better now two weeks later  Plan will be venogram and new lead insertion if vein is occluded would consider lead extraction and reinsertion as opposed to contralateral vein insertion with tunneling

## 2013-09-17 NOTE — Progress Notes (Signed)
Utilization Review Completed.Andre Malone T1/14/2015  
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
## 2013-09-17 NOTE — CV Procedure (Signed)
Andre Malone Andre Malone [id number]  [id number]  Preop [id number]:complete heart block , ventricular lead oversensing and pacing inhibition,  Postop Dx same/   Procedure:venogram, RV lead insertion with pulse generator replacement   Cx: None   Dictation number [id number]  Sherryl MangesSteven Norberto Wishon, MD 09/17/2013 9:53 AM

## 2013-09-18 ENCOUNTER — Inpatient Hospital Stay (HOSPITAL_COMMUNITY): Payer: Medicare Other

## 2013-09-18 DIAGNOSIS — I442 Atrioventricular block, complete: Secondary | ICD-10-CM

## 2013-09-18 LAB — GLUCOSE, CAPILLARY
GLUCOSE-CAPILLARY: 128 mg/dL — AB (ref 70–99)
GLUCOSE-CAPILLARY: 84 mg/dL (ref 70–99)
Glucose-Capillary: 144 mg/dL — ABNORMAL HIGH (ref 70–99)
Glucose-Capillary: 130 mg/dL — ABNORMAL HIGH (ref 70–99)

## 2013-09-18 LAB — PROTIME-INR
INR: 1.94 — ABNORMAL HIGH (ref 0.00–1.49)
INR: 1.91 — ABNORMAL HIGH (ref 0.00–1.49)
Prothrombin Time: 21.6 seconds — ABNORMAL HIGH (ref 11.6–15.2)
Prothrombin Time: 21.3 seconds — ABNORMAL HIGH (ref 11.6–15.2)

## 2013-09-18 MED ORDER — WARFARIN - PHARMACIST DOSING INPATIENT: Freq: Every day | Status: DC

## 2013-09-18 MED ORDER — WARFARIN SODIUM 7.5 MG PO TABS
7.5000 mg | ORAL_TABLET | ORAL | Status: AC
  Administered 2013-09-18: 7.5 mg via ORAL
  Filled 2013-09-18: qty 1

## 2013-09-18 NOTE — Discharge Summary (Addendum)
ELECTROPHYSIOLOGY PROCEDURE DISCHARGE SUMMARY    Patient ID: Andre Malone,  MRN: [id number], DOB/AGE: [birth year] 63 y.o.  Admit date: 09/17/2013 Discharge date: 09/19/2013  Primary Care Physician: Andre Heap, MD Primary Cardiologist: Andre Malone Electrophysiologist: Andre Manges, MD  Primary Discharge Diagnosis:  Complete heart block with ventricular lead malfunction status post RV lead revision and pacemaker generator change this admission  Secondary Discharge Diagnosis:  1.  Type II diabetes 2.  CAD - a. nonobstructive cardiac cath 09/2010 b. normal stress Myoview- no evidence of ischemia, no WMAs, EF 55% 02/2011; b. s/p NSTEMI - LHC (9/14): dLM 20, oLAD 40, pLAD 50 involving diagonal, mLAD 60-70, pRI 70, mCFX 30, pRCA 30-40, mRCA stent ok, dRCA 40, mPDA (small vessel - 2mm) 80-90 (likely culprit) => agg Med Rx rec with consideration of PCI of PDA is refractory sx's despite max med Rx  3.  Hypertension 4.  Hypothyroidism 5.  Hyperlipidemia 6.  Aortic valve disease s/p mechanical AVR; Bentall procedure   Allergies  Allergen Reactions  . Ace Inhibitors Other (See Comments)    cough  . Benadryl [Diphenhydramine Hcl] Other (See Comments)    Makes restless legs worse  . Diphenhydramine Hcl Other (See Comments)    Makes restless legs worse  . Imdur [Isosorbide Dinitrate]     headache  . Metoprolol Other (See Comments)    Kidney failure  . Morphine And Related Other (See Comments)    Makes restless legs worse  . Nsaids Other (See Comments)    REACTION: Currently taking Coumadin  . Valsartan Other (See Comments)    REACTION:shuts down Kidney    Procedures This Admission:  1.  Right ventricular lead revision and pacemaker generator change on 09-17-2013 by Andre Andre Malone.  The patient received a STJ model number 1688 RV lead with STJ Assurity pacemaker.  The previously implanted 2088 right ventricular lead was capped.  There were no early apparent complications.   2.  CXR on  09-18-2013 demonstrated no pneumothorax status post pacemaker implant.   Brief HPI: Andre Malone is a 63 year old male with a past medical history of complete heart block status post pacemaker implant.  He recently developed oversensing of noise on his ventricular lead resulting in inhibition of pacing and dizziness.  Risks, benefits, and alternatives to RV lead revision and pacemaker generator change were reviewed with the patient who wished to proceed.   Hospital Course:  The patient was admitted and underwent implantation of a new 1688 RV lead and STJ Assurity pacemaker with details as outlined above.   He was monitored on telemetry overnight which demonstrated sinus rhythm with ventricular pacing.  Left chest was without hematoma or ecchymosis.  The device was interrogated and found to be functioning normally.  CXR was obtained and demonstrated no pneumothorax status post device implantation. Due to device dependence, he was monitored in the hospital for 48 hours post implant.   Wound care, arm mobility, and restrictions were reviewed with the patient.  Andre Malone examined the patient and considered them stable for discharge to home.   Physical Exam: Filed Vitals:   09/18/13 0748 09/18/13 1345 09/18/13 2020 09/19/13 0356  BP: 115/43 125/57 140/61 129/66  Pulse: 68 65 66 60  Temp: 97.4 F (36.3 C) 97.2 F (36.2 C) 98.5 F (36.9 C) 97.6 F (36.4 C)  TempSrc: Oral Oral Oral Oral  Resp: 16 18 18 20   Height:      Weight:      SpO2: 96%  97% 99% 95%    GEN- The patient is well appearing, alert and oriented x 3 today.   Head- normocephalic, atraumatic Eyes-  Sclera clear, conjunctiva pink Ears- hearing intact Oropharynx- clear Neck- supple,  Lungs- Clear to ausculation bilaterally, normal work of breathing Heart- Regular rate and rhythm, mechanical A2 GI- soft, NT, ND, + BS Extremities- no clubbing, cyanosis, or edema MS- no significant deformity or atrophy Skin- pacemaker pocket is  without hematoma   Lab Results  Component Value Date   WBC 9.1 09/10/2013   HGB 13.2 09/10/2013   HCT 38.8 09/10/2013   MCV 88 09/10/2013   PLT 212 09/10/2013   No results found for this basename: NA, K, CL, CO2, BUN, CREATININE, CALCIUM, LABALBU, PROT, BILITOT, ALKPHOS, ALT, AST, GLUCOSE,  in the last 168 hours   Discharge Medications:    Medication List    ASK your doctor about these medications       ADULT GUMMY PO  Take 2 tablets by mouth.     amLODipine 5 MG tablet  Commonly known as:  NORVASC  Take 1 tablet (5 mg total) by mouth 2 (two) times daily.     amoxicillin 500 MG tablet  Commonly known as:  AMOXIL  Take 4 tablets (2gm) 30 to 60 minutes prior to dental procedures     aspirin 81 MG EC tablet  Take 1 tablet (81 mg total) by mouth daily.     gabapentin 100 MG capsule  Commonly known as:  NEURONTIN  Take 100 mg by mouth 3 (three) times daily as needed (for pain).     insulin glargine 100 UNIT/ML injection  Commonly known as:  LANTUS  Inject 50 Units into the skin daily after supper.     levothyroxine 88 MCG tablet  Commonly known as:  SYNTHROID, LEVOTHROID  TAKE ONE TABLET BY MOUTH ONCE DAILY     metFORMIN 1000 MG tablet  Commonly known as:  GLUCOPHAGE  Take 1 tablet (1,000 mg total) by mouth 2 (two) times daily with a meal.     methocarbamol 500 MG tablet  Commonly known as:  ROBAXIN  Take 500 mg by mouth every 6 (six) hours as needed for muscle spasms.     nitroGLYCERIN 0.4 MG SL tablet  Commonly known as:  NITROSTAT  Place 1 tablet (0.4 mg total) under the tongue every 5 (five) minutes x 3 doses as needed for chest pain.     oxyCODONE-acetaminophen 5-325 MG per tablet  Commonly known as:  PERCOCET/ROXICET  Take 1-2 tablets by mouth every 4 (four) hours as needed for moderate pain or severe pain.     pramipexole 0.25 MG tablet  Commonly known as:  MIRAPEX  Take 2 tablets by mouth daily as needed (restless leg).     pravastatin 40 MG tablet  Commonly  known as:  PRAVACHOL  Take 1 tablet (40 mg total) by mouth every evening.     sertraline 100 MG tablet  Commonly known as:  ZOLOFT  Take 100 mg by mouth daily.     tamsulosin 0.4 MG Caps capsule  Commonly known as:  FLOMAX  Take 0.4 mg by mouth daily as needed. For kidney stones     traMADol 50 MG tablet  Commonly known as:  ULTRAM  Take 50-100 mg by mouth every 6 (six) hours as needed. pain     traZODone 100 MG tablet  Commonly known as:  DESYREL  TAKE ONE TABLET BY MOUTH AT BEDTIME  warfarin 5 MG tablet  Commonly known as:  COUMADIN  TAKE ONE TO ONE & ONE-HALF TABLETS BY MOUTH ONCE DAILY. TAKE 7.5 MGS ON MONDAYS AND THURSDAYS. TAKE 5 MGS ON ALL OTHER DAYS        Disposition:   Future Appointments Provider Department Dept Phone   09/29/2013 11:00 AM [facility] [phone]   10/10/2013 1:00 PM Glendale Chard, MD [facility] [phone]       Duration of Discharge Encounter: Greater than 30 minutes including physician time.  Signed,  Hillis Range MD
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
## 2013-09-19 ENCOUNTER — Encounter (HOSPITAL_COMMUNITY): Payer: Self-pay | Admitting: *Deleted

## 2013-09-19 LAB — PROTIME-INR
INR: 1.78 — ABNORMAL HIGH (ref 0.00–1.49)
Prothrombin Time: 20.2 seconds — ABNORMAL HIGH (ref 11.6–15.2)

## 2013-09-19 LAB — GLUCOSE, CAPILLARY: Glucose-Capillary: 89 mg/dL (ref 70–99)

## 2013-09-19 MED ORDER — WARFARIN SODIUM 5 MG PO TABS: ORAL_TABLET | ORAL | Status: DC

## 2013-09-19 NOTE — Progress Notes (Signed)
NURSING PROGRESS NOTE  Andre Malone [id number] Discharge Data: 09/19/2013 10:32 AM Attending Provider: No att. providers found WUJ:[id number], Dorinda Hill, MD     [name] to be D/C'd Home per MD order.  Discussed with the patient the After Visit Summary and all questions fully answered. All IV's discontinued with no bleeding noted. All belongings returned to patient for patient to take home.   Last Vital Signs:  Blood pressure 129/66, pulse 60, temperature 97.6 F (36.4 C), temperature source Oral, resp. rate 20, height 5' 6.5" (1.689 m), weight 102.059 kg (225 lb), SpO2 95.00%.  Discharge Medication List   Medication List         ADULT GUMMY PO  Take 2 tablets by mouth.     amLODipine 5 MG tablet  Commonly known as:  NORVASC  Take 1 tablet (5 mg total) by mouth 2 (two) times daily.     amoxicillin 500 MG tablet  Commonly known as:  AMOXIL  Take 4 tablets (2gm) 30 to 60 minutes prior to dental procedures     aspirin 81 MG EC tablet  Take 1 tablet (81 mg total) by mouth daily.     gabapentin 100 MG capsule  Commonly known as:  NEURONTIN  Take 100 mg by mouth 3 (three) times daily as needed (for pain).     insulin glargine 100 UNIT/ML injection  Commonly known as:  LANTUS  Inject 50 Units into the skin daily after supper.     levothyroxine 88 MCG tablet  Commonly known as:  SYNTHROID, LEVOTHROID  TAKE ONE TABLET BY MOUTH ONCE DAILY     metFORMIN 1000 MG tablet  Commonly known as:  GLUCOPHAGE  Take 1 tablet (1,000 mg total) by mouth 2 (two) times daily with a meal.     methocarbamol 500 MG tablet  Commonly known as:  ROBAXIN  Take 500 mg by mouth every 6 (six) hours as needed for muscle spasms.     nitroGLYCERIN 0.4 MG SL tablet  Commonly known as:  NITROSTAT  Place 1 tablet (0.4 mg total) under the tongue every 5 (five) minutes x 3 doses as needed for chest pain.     oxyCODONE-acetaminophen 5-325 MG per tablet  Commonly known as:  PERCOCET/ROXICET  Take 1-2  tablets by mouth every 4 (four) hours as needed for moderate pain or severe pain.     pramipexole 0.25 MG tablet  Commonly known as:  MIRAPEX  Take 2 tablets by mouth daily as needed (restless leg).     pravastatin 40 MG tablet  Commonly known as:  PRAVACHOL  Take 1 tablet (40 mg total) by mouth every evening.     sertraline 100 MG tablet  Commonly known as:  ZOLOFT  Take 100 mg by mouth daily.     tamsulosin 0.4 MG Caps capsule  Commonly known as:  FLOMAX  Take 0.4 mg by mouth daily as needed. For kidney stones     traMADol 50 MG tablet  Commonly known as:  ULTRAM  Take 50-100 mg by mouth every 6 (six) hours as needed. pain     traZODone 100 MG tablet  Commonly known as:  DESYREL  TAKE ONE TABLET BY MOUTH AT BEDTIME     warfarin 5 MG tablet  Commonly known as:  COUMADIN  - Take 1.5 tablets (7.5mg ) by mouth today (1/16) and tomorrow (1/17).  - On Sunday 1/18, resume home dose of 1.5 tablets (7.5mg ) on Mondays and Thursdays and 1 tablet (5mg ) on  all other days.

## 2013-09-19 NOTE — Discharge Instructions (Signed)
° ° °  Supplemental Discharge Instructions for  Pacemaker Patients  Activity No heavy lifting or vigorous activity with your left/right arm for 6 to 8 weeks.  Do not raise your left/right arm above your head for one week.  Gradually raise your affected arm as drawn below.          09/21/13                            09/22/13                         09/23/13                  09/24/13  NO DRIVING for  1 week   WOUND CARE   Keep the wound area clean and dry.  Do not get this area wet for one week. No showers for one week; you may shower on  09/26/13   .   The tape/steri-strips on your wound will fall off; do not pull them off.  No bandage is needed on the site.  DO  NOT apply any creams, oils, or ointments to the wound area.   If you notice any drainage or discharge from the wound, any swelling or bruising at the site, or you develop a fever > 101? F after you are discharged home, call the office at once.  Special Instructions   You are still able to use cellular telephones; use the ear opposite the side where you have your pacemaker/defibrillator.  Avoid carrying your cellular phone near your device.   When traveling through airports, show security personnel your identification card to avoid being screened in the metal detectors.  Ask the security personnel to use the hand wand.   Avoid arc welding equipment, MRI testing (magnetic resonance imaging), TENS units (transcutaneous nerve stimulators).  Call the office for questions about other devices.   Avoid electrical appliances that are in poor condition or are not properly grounded.   Microwave ovens are safe to be near or to operate.

## 2013-09-23 ENCOUNTER — Other Ambulatory Visit: Payer: Self-pay | Admitting: Family Medicine

## 2013-09-25 ENCOUNTER — Ambulatory Visit (INDEPENDENT_AMBULATORY_CARE_PROVIDER_SITE_OTHER): Payer: Medicare Other | Admitting: Pharmacist

## 2013-09-25 DIAGNOSIS — I4891 Unspecified atrial fibrillation: Secondary | ICD-10-CM

## 2013-09-25 LAB — POCT INR: INR: 2.2

## 2013-09-25 NOTE — Patient Instructions (Signed)
Anticoagulation Dose Instructions as of 09/25/2013     Andre SmilesSun Mon Tue Wed Thu Fri Sat   New Dose 5 mg 7.5 mg 5 mg 5 mg 7.5 mg 5 mg 5 mg    Description       Continue 1 and 1/2 tablets on Mondays and Thursdays, 1 tablet all other days.        INR was 2.2 today

## 2013-09-25 NOTE — Telephone Encounter (Signed)
Last ov 9/14 

## 2013-09-29 ENCOUNTER — Ambulatory Visit (INDEPENDENT_AMBULATORY_CARE_PROVIDER_SITE_OTHER): Payer: Medicare Other | Admitting: *Deleted

## 2013-09-29 DIAGNOSIS — Z950 Presence of cardiac pacemaker: Secondary | ICD-10-CM

## 2013-09-29 DIAGNOSIS — I442 Atrioventricular block, complete: Secondary | ICD-10-CM

## 2013-09-29 LAB — MDC_IDC_ENUM_SESS_TYPE_INCLINIC
Battery Remaining Longevity: 79.2 mo
Battery Voltage: 3.1 V
Brady Statistic RA Percent Paced: 14 %
Brady Statistic RV Percent Paced: 99.9 %
Date Time Interrogation Session: 20150126120646
Implantable Pulse Generator Model: 2240
Implantable Pulse Generator Serial Number: 7586540
Lead Channel Impedance Value: 387.5 Ohm
Lead Channel Impedance Value: 550 Ohm
Lead Channel Pacing Threshold Amplitude: 0.75 V
Lead Channel Pacing Threshold Pulse Width: 0.5 ms
Lead Channel Pacing Threshold Pulse Width: 0.5 ms
Lead Channel Setting Pacing Amplitude: 2 V
Lead Channel Setting Pacing Amplitude: 3.5 V
Lead Channel Setting Pacing Pulse Width: 0.5 ms
Lead Channel Setting Sensing Sensitivity: 5 mV
MDC IDC MSMT LEADCHNL RA PACING THRESHOLD AMPLITUDE: 1 V
MDC IDC MSMT LEADCHNL RA PACING THRESHOLD AMPLITUDE: 1 V
MDC IDC MSMT LEADCHNL RA PACING THRESHOLD PULSEWIDTH: 0.5 ms
MDC IDC MSMT LEADCHNL RA SENSING INTR AMPL: 4.1 mV
MDC IDC MSMT LEADCHNL RV PACING THRESHOLD AMPLITUDE: 0.75 V
MDC IDC MSMT LEADCHNL RV PACING THRESHOLD PULSEWIDTH: 0.5 ms
MDC IDC MSMT LEADCHNL RV SENSING INTR AMPL: 11.3 mV

## 2013-09-29 NOTE — Progress Notes (Signed)
Normal device function. Thresholds, sensing, and impedances consistent with implant measurements. Device programmed at chronic setting for RA lead (changed output from 2.5V to 2.0V), 3.5V on for extra safety margin until 3 month visit for RV lead. Histogram distribution appropriate for patient and level of activity. No mode switches or high ventricular rates noted. Patient educated about wound care, arm mobility, lifting restrictions.   ROV w/ BE 12/31/13 @ 2:00.

## 2013-10-01 ENCOUNTER — Encounter: Payer: Medicare Other | Admitting: Internal Medicine

## 2013-10-08 ENCOUNTER — Telehealth: Payer: Self-pay | Admitting: Pharmacist

## 2013-10-08 NOTE — Telephone Encounter (Signed)
Patient thought next appt with anticoagulation clinic was 11/03/13 and was wondering if could check Protime when see Andre Malone on 10/31/13.  Explained that next appt with anticoag clinic was acutally 12/04/13 and that there is a note to have protime checked when he comes 10/31/13

## 2013-10-10 ENCOUNTER — Encounter: Payer: Self-pay | Admitting: Neurology

## 2013-10-10 ENCOUNTER — Ambulatory Visit (INDEPENDENT_AMBULATORY_CARE_PROVIDER_SITE_OTHER): Payer: Medicare Other | Admitting: Neurology

## 2013-10-10 VITALS — BP 140/84 | HR 64 | Temp 97.7°F | Ht 66.93 in | Wt 230.1 lb

## 2013-10-10 DIAGNOSIS — M6258 Muscle wasting and atrophy, not elsewhere classified, other site: Secondary | ICD-10-CM

## 2013-10-10 DIAGNOSIS — M62542 Muscle wasting and atrophy, not elsewhere classified, left hand: Secondary | ICD-10-CM

## 2013-10-10 DIAGNOSIS — M79602 Pain in left arm: Secondary | ICD-10-CM

## 2013-10-10 DIAGNOSIS — M79609 Pain in unspecified limb: Secondary | ICD-10-CM

## 2013-10-10 DIAGNOSIS — M6250 Muscle wasting and atrophy, not elsewhere classified, unspecified site: Secondary | ICD-10-CM

## 2013-10-10 LAB — SEDIMENTATION RATE: SED RATE: 10 mm/h (ref 0–16)
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
## 2013-10-10 NOTE — Patient Instructions (Addendum)
1.  EMG of the left arm 2.  Check blood work today 3.  Pain management referral 4.  Return to clinic 6- weeks   [facility] been [facility] to [facility]: [facility] [address] [place], [place] [address] Phone: [phone] Fax: [phone]  Your appointment has been scheduled for 10-22-2013 at 9:00 am.  Please have a valid ID and insurance card with you.

## 2013-10-11 LAB — C-REACTIVE PROTEIN

## 2013-10-11 LAB — CK: Total CK: 58 U/L (ref 7–232)

## 2013-10-12 LAB — ALDOLASE: Aldolase: 5.7 U/L (ref <=8.1)

## 2013-10-13 NOTE — Progress Notes (Signed)
Notes have been faxed.  Kelly sent them through epic and she will teach us how to do it today.

## 2013-10-17 ENCOUNTER — Encounter: Payer: Self-pay | Admitting: Internal Medicine

## 2013-10-21 ENCOUNTER — Other Ambulatory Visit: Payer: Self-pay | Admitting: Family Medicine

## 2013-10-24 ENCOUNTER — Encounter: Payer: Self-pay | Admitting: Cardiology

## 2013-10-24 ENCOUNTER — Telehealth: Payer: Self-pay | Admitting: Cardiology

## 2013-10-31 ENCOUNTER — Ambulatory Visit: Payer: Medicare Other | Admitting: Nurse Practitioner

## 2013-11-05 ENCOUNTER — Other Ambulatory Visit: Payer: Self-pay | Admitting: *Deleted

## 2013-11-05 ENCOUNTER — Emergency Department (HOSPITAL_COMMUNITY)
Admission: EM | Admit: 2013-11-05 | Discharge: 2013-11-05 | Discharge status: Against Medical Advice | Payer: Medicare Other

## 2013-11-05 MED ORDER — AMLODIPINE BESYLATE 5 MG PO TABS: 5.0000 mg | ORAL_TABLET | Freq: Two times a day (BID) | ORAL | Status: DC

## 2013-11-05 MED ORDER — PRAVASTATIN SODIUM 40 MG PO TABS: 40.0000 mg | ORAL_TABLET | Freq: Every evening | ORAL | Status: DC

## 2013-11-17 ENCOUNTER — Encounter: Payer: Medicare Other | Admitting: Neurology

## 2013-11-18 ENCOUNTER — Other Ambulatory Visit: Payer: Self-pay | Admitting: Family Medicine

## 2013-11-19 ENCOUNTER — Encounter: Payer: Self-pay | Admitting: Nurse Practitioner

## 2013-11-19 ENCOUNTER — Ambulatory Visit (INDEPENDENT_AMBULATORY_CARE_PROVIDER_SITE_OTHER): Payer: Medicare Other | Admitting: Nurse Practitioner

## 2013-11-19 ENCOUNTER — Encounter: Payer: Self-pay | Admitting: Cardiology

## 2013-11-19 VITALS — BP 136/68 | HR 62 | Temp 97.7°F | Ht 66.63 in | Wt 233.0 lb

## 2013-11-19 DIAGNOSIS — I4891 Unspecified atrial fibrillation: Secondary | ICD-10-CM

## 2013-11-19 DIAGNOSIS — I10 Essential (primary) hypertension: Secondary | ICD-10-CM

## 2013-11-19 DIAGNOSIS — E785 Hyperlipidemia, unspecified: Secondary | ICD-10-CM

## 2013-11-19 DIAGNOSIS — E119 Type 2 diabetes mellitus without complications: Secondary | ICD-10-CM

## 2013-11-19 LAB — POCT CBC
Granulocyte percent: 81.1 %G — AB (ref 37–80)
HCT, POC: 40.2 % — AB (ref 43.5–53.7)
HEMOGLOBIN: 12.9 g/dL — AB (ref 14.1–18.1)
Lymph, poc: 1.4 (ref 0.6–3.4)
MCH, POC: 28 pg (ref 27–31.2)
MCHC: 32.1 g/dL (ref 31.8–35.4)
MCV: 87.3 fL (ref 80–97)
MPV: 8.5 fL (ref 0–99.8)
POC Granulocyte: 7.5 — AB (ref 2–6.9)
POC LYMPH PERCENT: 15.6 %L (ref 10–50)
Platelet Count, POC: 171 10*3/uL (ref 142–424)
RBC: 4.6 M/uL — AB (ref 4.69–6.13)
RDW, POC: 14.8 %
WBC: 9.2 10*3/uL (ref 4.6–10.2)

## 2013-11-19 LAB — POCT INR: INR: 2.5

## 2013-11-19 MED ORDER — TRAZODONE HCL 100 MG PO TABS: ORAL_TABLET | ORAL | Status: DC
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
## 2013-11-19 NOTE — Patient Instructions (Signed)
Anticoagulation Dose Instructions as of 11/19/2013     Andre Malone Tue Wed Thu Fri Sat   New Dose 5 mg 7.5 mg 5 mg 5 mg 7.5 mg 5 mg 5 mg    Description       Continue 1 and 1/2 tablets on Mondays and Thursdays, 1 tablet all other days.      recheck in 6 weeks   Health Maintenance, Males A healthy lifestyle and preventative care can promote health and wellness.  Maintain regular health, dental, and eye exams.  Eat a healthy diet. Foods like vegetables, fruits, whole grains, low-fat dairy products, and lean protein foods contain the nutrients you need and are low in calories. Decrease your intake of foods high in solid fats, added sugars, and salt. Get information about a proper diet from your health care provider, if necessary.  Regular physical exercise is one of the most important things you can do for your health. Most adults should get at least 150 minutes of moderate-intensity exercise (any activity that increases your heart rate and causes you to sweat) each week. In addition, most adults need muscle-strengthening exercises on 2 or more days a week.   Maintain a healthy weight. The body mass index (BMI) is a screening tool to identify possible weight problems. It provides an estimate of body fat based on height and weight. Your health care provider can find your BMI and can help you achieve or maintain a healthy weight. For males 20 years and older:  A BMI below 18.5 is considered underweight.  A BMI of 18.5 to 24.9 is normal.  A BMI of 25 to 29.9 is considered overweight.  A BMI of 30 and above is considered obese.  Maintain normal blood lipids and cholesterol by exercising and minimizing your intake of saturated fat. Eat a balanced diet with plenty of fruits and vegetables. Blood tests for lipids and cholesterol should begin at age 70 and be repeated every 5 years. If your lipid or cholesterol levels are high, you are over 50, or you are at high risk for heart disease, you may  need your cholesterol levels checked more frequently.Ongoing high lipid and cholesterol levels should be treated with medicines, if diet and exercise are not working.  If you smoke, find out from your health care provider how to quit. If you do not use tobacco, do not start.  Lung cancer screening is recommended for adults aged 73 80 years who are at high risk for developing lung cancer because of a history of smoking. A yearly low-dose CT scan of the lungs is recommended for people who have at least a 30-pack-year history of smoking and are a current smoker or have quit within the past 15 years. A pack year of smoking is smoking an average of 1 pack of cigarettes a day for 1 year (for example, a 30-pack-year history of smoking could mean smoking 1 pack a day for 30 years or 2 packs a day for 15 years). Yearly screening should continue until the smoker has stopped smoking for at least 15 years. Yearly screening should be stopped for people who develop a health problem that would prevent them from having lung cancer treatment.  If you choose to drink alcohol, do not have more than 2 drinks per day. One drink is considered to be 12 oz (360 mL) of beer, 5 oz (150 mL) of wine, or 1.5 oz (45 mL) of liquor.  Avoid use of street drugs. Do not share  needles with anyone. Ask for help if you need support or instructions about stopping the use of drugs.  High blood pressure causes heart disease and increases the risk of stroke. Blood pressure should be checked at least every 1 2 years. Ongoing high blood pressure should be treated with medicines if weight loss and exercise are not effective.  If you are 52 63 years old, ask your health care provider if you should take aspirin to prevent heart disease.  Diabetes screening involves taking a blood sample to check your fasting blood sugar level. This should be done once every 3 years after age 20, if you are at a normal weight and without risk factors for diabetes.  Testing should be considered at a younger age or be carried out more frequently if you are overweight and have at least 1 risk factor for diabetes.  Colorectal cancer can be detected and often prevented. Most routine colorectal cancer screening begins at the age of 69 and continues through age 17. However, your health care provider may recommend screening at an earlier age if you have risk factors for colon cancer. On a yearly basis, your health care provider may provide home test kits to check for hidden blood in the stool. A small camera at the end of a tube may be used to directly examine the colon (sigmoidoscopy or colonoscopy) to detect the earliest forms of colorectal cancer. Talk to your health care provider about this at age 14, when routine screening begins. A direct exam of the colon should be repeated every 5 10 years through age 83, unless early forms of pre-cancerous polyps or small growths are found.  People who are at an increased risk for hepatitis B should be screened for this virus. You are considered at high risk for hepatitis B if:  You were born in a country where hepatitis B occurs often. Talk with your health care provider about which countries are considered high-risk.  Your parents were born in a high-risk country and you have not received a shot to protect against hepatitis B (hepatitis B vaccine).  You have HIV or AIDS.  You use needles to inject street drugs.  You live with, or have sex with, someone who has hepatitis B.  You are a man who has sex with other men (MSM).  You get hemodialysis treatment.  You take certain medicines for conditions like cancer, organ transplantation, and autoimmune conditions.  Hepatitis C blood testing is recommended for all people born from [birth year] through 1965 and any individual with known risk factors for hepatitis C.  Healthy men should no longer receive prostate-specific antigen (PSA) blood tests as part of routine cancer screening.  Talk to your health care provider about prostate cancer screening.  Testicular cancer screening is not recommended for adolescents or adult males who have no symptoms. Screening includes self-exam, a health care provider exam, and other screening tests. Consult with your health care provider about any symptoms you have or any concerns you have about testicular cancer.  Practice safe sex. Use condoms and avoid high-risk sexual practices to reduce the spread of sexually transmitted infections (STIs).  Use sunscreen. Apply sunscreen liberally and repeatedly throughout the day. You should seek shade when your shadow is shorter than you. Protect yourself by wearing long sleeves, pants, a wide-brimmed hat, and sunglasses year round, whenever you are outdoors.  Tell your health care provider of new moles or changes in moles, especially if there is a change in shape or  color. Also tell your provider if a mole is larger than the size of a pencil eraser.  A one-time screening for abdominal aortic aneurysm (AAA) and surgical repair of large AAAs by ultrasound is recommended for men aged 63 75 years who are current or former smokers.  Stay current with your vaccines (immunizations). Document Released: 02/17/2008 Document Revised: 06/11/2013 Document Reviewed: 01/16/2011 [facility] Patient Information 2014 [place], [place].

## 2013-11-20 ENCOUNTER — Ambulatory Visit: Payer: Medicare Other | Attending: Anesthesiology | Admitting: Physical Therapy

## 2013-11-20 DIAGNOSIS — M542 Cervicalgia: Secondary | ICD-10-CM | POA: Insufficient documentation

## 2013-11-20 DIAGNOSIS — R42 Dizziness and giddiness: Secondary | ICD-10-CM | POA: Insufficient documentation

## 2013-11-20 DIAGNOSIS — R5381 Other malaise: Secondary | ICD-10-CM | POA: Insufficient documentation

## 2013-11-20 LAB — MICROALBUMIN / CREATININE URINE RATIO
Creatinine, Ur: 181.9 mg/dL (ref 22.0–328.0)
MICROALB/CREAT RATIO: 14.2 mg/g creat (ref 0.0–30.0)
Microalbumin, Urine: 25.9 ug/mL — ABNORMAL HIGH (ref 0.0–17.0)

## 2013-11-21 LAB — PSA, TOTAL AND FREE
PSA, Free Pct: 45 %
PSA, Free: 0.27 ng/mL
PSA: 0.6 ng/mL (ref 0.0–4.0)

## 2013-11-21 LAB — BASIC METABOLIC PANEL
BUN/Creatinine Ratio: 17 (ref 10–22)
BUN: 16 mg/dL (ref 8–27)
CHLORIDE: 101 mmol/L (ref 97–108)
CO2: 22 mmol/L (ref 18–29)
CREATININE: 0.94 mg/dL (ref 0.76–1.27)
Calcium: 9.8 mg/dL (ref 8.6–10.2)
GFR calc Af Amer: 100 mL/min/{1.73_m2} (ref >59)
GFR, EST NON AFRICAN AMERICAN: 87 mL/min/{1.73_m2} (ref >59)
GLUCOSE: 250 mg/dL — AB (ref 65–99)
Potassium: 4.6 mmol/L (ref 3.5–5.2)
Sodium: 141 mmol/L (ref 134–144)

## 2013-11-21 LAB — NMR, LIPOPROFILE
Cholesterol: 197 mg/dL (ref <200)
HDL Cholesterol by NMR: 45 mg/dL (ref >=40)
HDL Particle Number: 30.8 umol/L (ref >=30.5)
LDL PARTICLE NUMBER: 1860 nmol/L — AB (ref <1000)
LDL Size: 20.7 nm (ref >20.5)
LDLC SERPL CALC-MCNC: 97 mg/dL (ref <100)
LP-IR SCORE: 79 — AB (ref <=45)
Small LDL Particle Number: 922 nmol/L — ABNORMAL HIGH (ref <=527)
TRIGLYCERIDES BY NMR: 274 mg/dL — AB (ref <150)

## 2013-11-21 LAB — VITAMIN D 25 HYDROXY (VIT D DEFICIENCY, FRACTURES): VIT D 25 HYDROXY: 23.1 ng/mL — AB (ref 30.0–100.0)

## 2013-11-24 ENCOUNTER — Telehealth: Payer: Self-pay | Admitting: Nurse Practitioner

## 2013-11-24 NOTE — Telephone Encounter (Signed)
Pt wants copy of labs to take to [facility] app[facility].  Coming to pick up copy.  rs

## 2013-11-25 ENCOUNTER — Ambulatory Visit: Payer: Medicare Other | Admitting: Neurology

## 2013-11-25 LAB — POCT GLYCOSYLATED HEMOGLOBIN (HGB A1C): HEMOGLOBIN A1C: 6.6

## 2013-11-25 NOTE — Addendum Note (Signed)
Addended by: Prescott GumLAND, Konstance Happel M on: 11/25/2013 03:49 PM   Modules accepted: Orders

## 2013-11-26 ENCOUNTER — Other Ambulatory Visit: Payer: Medicare Other

## 2013-12-11 ENCOUNTER — Encounter: Payer: Medicare Other | Admitting: Neurology

## 2013-12-17 ENCOUNTER — Other Ambulatory Visit: Payer: Self-pay | Admitting: Family Medicine

## 2013-12-17 ENCOUNTER — Telehealth: Payer: Self-pay | Admitting: Nurse Practitioner

## 2013-12-17 NOTE — Telephone Encounter (Signed)
done

## 2013-12-19 ENCOUNTER — Ambulatory Visit: Payer: Medicare Other | Admitting: Neurology

## 2013-12-31 ENCOUNTER — Encounter: Payer: Medicare Other | Admitting: Cardiology

## 2014-01-01 ENCOUNTER — Ambulatory Visit (INDEPENDENT_AMBULATORY_CARE_PROVIDER_SITE_OTHER): Payer: Medicare Other | Admitting: Pharmacist

## 2014-01-01 DIAGNOSIS — I4891 Unspecified atrial fibrillation: Secondary | ICD-10-CM

## 2014-01-01 LAB — POCT INR: INR: 3.1

## 2014-01-01 NOTE — Patient Instructions (Signed)
Anticoagulation Dose Instructions as of 01/01/2014     Andre SmilesSun Mon Tue Wed Thu Fri Sat   New Dose 5 mg 7.5 mg 5 mg 5 mg 7.5 mg 5 mg 5 mg    Description       No warfarin today, then restart regular dose of 1 and 1/2 tablets on Mondays and Thursdays, 1 tablet all other days.        INR was 3.1 today

## 2014-01-06 ENCOUNTER — Encounter: Payer: Medicare Other | Admitting: Cardiology

## 2014-01-08 ENCOUNTER — Encounter: Payer: Medicare Other | Admitting: Cardiology

## 2014-01-22 ENCOUNTER — Telehealth: Payer: Self-pay | Admitting: Nurse Practitioner

## 2014-01-22 MED ORDER — WARFARIN SODIUM 5 MG PO TABS: ORAL_TABLET | ORAL | Status: DC

## 2014-01-22 NOTE — Telephone Encounter (Signed)
done

## 2014-02-03 ENCOUNTER — Encounter: Payer: Self-pay | Admitting: Cardiology

## 2014-02-03 ENCOUNTER — Ambulatory Visit (INDEPENDENT_AMBULATORY_CARE_PROVIDER_SITE_OTHER): Payer: Medicare Other | Admitting: Cardiology

## 2014-02-03 ENCOUNTER — Encounter: Payer: Self-pay | Admitting: Internal Medicine

## 2014-02-03 VITALS — BP 132/75 | HR 66 | Ht 66.0 in | Wt 233.0 lb

## 2014-02-03 DIAGNOSIS — Z950 Presence of cardiac pacemaker: Secondary | ICD-10-CM

## 2014-02-03 DIAGNOSIS — Z45018 Encounter for adjustment and management of other part of cardiac pacemaker: Secondary | ICD-10-CM

## 2014-02-03 DIAGNOSIS — I442 Atrioventricular block, complete: Secondary | ICD-10-CM

## 2014-02-03 LAB — MDC_IDC_ENUM_SESS_TYPE_INCLINIC
Battery Voltage: 3.01 V
Brady Statistic RA Percent Paced: 8.7 %
Date Time Interrogation Session: 20150602143621
Implantable Pulse Generator Model: 2240
Implantable Pulse Generator Serial Number: 7586540
Lead Channel Pacing Threshold Amplitude: 0.5 V
Lead Channel Pacing Threshold Amplitude: 1 V
Lead Channel Pacing Threshold Pulse Width: 0.5 ms
Lead Channel Sensing Intrinsic Amplitude: 4.7 mV
Lead Channel Setting Pacing Amplitude: 2.5 V
Lead Channel Setting Pacing Pulse Width: 0.5 ms
MDC IDC MSMT BATTERY REMAINING LONGEVITY: 110.4 mo
MDC IDC MSMT LEADCHNL RA IMPEDANCE VALUE: 410 Ohm
MDC IDC MSMT LEADCHNL RV IMPEDANCE VALUE: 650 Ohm
MDC IDC MSMT LEADCHNL RV PACING THRESHOLD PULSEWIDTH: 0.5 ms
MDC IDC SET LEADCHNL RA PACING AMPLITUDE: 2 V
MDC IDC SET LEADCHNL RV SENSING SENSITIVITY: 5 mV
MDC IDC STAT BRADY RV PERCENT PACED: 99.87 %
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
## 2014-02-03 NOTE — Patient Instructions (Addendum)
Remote monitoring is used to monitor your Pacemaker of ICD from home. This monitoring reduces the number of office visits required to check your device to one time per year. It allows [place] to keep an eye on the functioning of your device to ensure it is working properly. You are scheduled for a device check from home on 05/06/14. You may send your transmission at any time that day. If you have a wireless device, the transmission will be sent automatically. After your physician reviews your transmission, you will receive a postcard with your next transmission date.     Your physician wants you to follow-up in: 1 year with Dr. Logan Bores will receive a reminder letter in the mail two months in advance. If you don't receive a letter, please call our office to schedule the follow-up appointment.

## 2014-02-05 ENCOUNTER — Ambulatory Visit (INDEPENDENT_AMBULATORY_CARE_PROVIDER_SITE_OTHER): Payer: Medicare Other | Admitting: Neurology

## 2014-02-05 ENCOUNTER — Encounter: Payer: Self-pay | Admitting: Neurology

## 2014-02-05 DIAGNOSIS — M79602 Pain in left arm: Secondary | ICD-10-CM

## 2014-02-05 DIAGNOSIS — M79609 Pain in unspecified limb: Secondary | ICD-10-CM

## 2014-02-05 NOTE — Progress Notes (Signed)
See procedure note for EMG results.  Andre Maines K. Melitta Tigue, DO  

## 2014-02-05 NOTE — Procedures (Signed)
[facility]  [address] [place], [address]  [place], [place] [address] Tel: [phone] Fax:  [phone] Test Date:  02/05/2014  Patient: Andre Malone DOB: 1951-08-09 Physician: Nita Sickle, DO  Sex: Male Height: 5\' 6"  Ref Phys: Nita Sickle  ID#: [id number] Temp: 33.0C Technician: Ala Bent R. NCS T.   Patient Complaints: This is a 63 year-old man presenting with left arm pain and weakness.    NCV & EMG Findings: Electrodiagnostic testing of the left upper extremity was prematurely terminated at patient's request due to intolerance of procedure.  Findings are as follows: 1. The nerve conduction portion of testing shows normal median, ulnar, radial, and lateral cutaneous sensory responses.  Medial antebrachial cutaneous responses are absent bilaterally and may be technical in nature. 2. Median and ulnar motor studies are normal. 3. No active or chronic motor axon loss changes affecting the first dorsal interosseous or extensor indicis proprius muscles.   Impression: The nerve conduction portion of the left upper extremity is essentially normal.  The needle electrode examination is too limited to make any meaningful conclusions.   ___________________________ Nita Sickle, DO    Nerve Conduction Studies Anti Sensory Summary Table   Site NR Peak (ms) Norm Peak (ms) P-T Amp (V) Norm P-T Amp  Left Lat Ante Brach Cutan Anti Sensory (Lat Forearm)  33C  Lat Biceps    2.5  8.9       2.4  13.0   Left Med Ante Brach Cutan Anti Sensory (Med Forearm)  33C  Elbow NR      Right Med Ante Brach Cutan Anti Sensory (Med Forearm)  33C  Elbow NR      Left Median Anti Sensory (2nd Digit)  33C  Wrist    3.2 <3.7 24.9 >20  Left Radial Anti Sensory (Base 1st Digit)  33C  Wrist    2.0 <2.7 20.6 >20  Left Ulnar Anti Sensory (5th Digit)  33C  Wrist    2.7 <3.5 20.1 >10   Motor Summary Table   Site NR Onset (ms) Norm Onset (ms) O-P Amp (mV) Norm O-P Amp Site1 Site2 Delta-0  (ms) Dist (cm) Vel (m/s) Norm Vel (m/s)  Left Median Motor (Abd Poll Brev)  33C  Wrist    3.8 <4.4 9.7 >4 Elbow Wrist 4.5 22.0 49 >49  Elbow    8.3  8.6         Left Ulnar Motor (Abd Dig Minimi)  33C  Wrist    2.6 <3.5 6.4 >6 B Elbow Wrist 3.5 20.5 59 >49  B Elbow    6.1  5.9  A Elbow B Elbow 1.7 10.0 59 >49  A Elbow    7.8  5.8          F Wave Studies   NR F-Lat (ms) Lat Norm (ms) L-R F-Lat (ms)  Left Ulnar (Mrkrs) (Abd Dig Min)  33C     27.27 <33    EMG   Side Muscle Ins Act Fibs Psw Fasc Number Recrt Dur Dur. Amp Amp. Poly Poly. Comment  Left 1stDorInt Nml Nml Nml Nml Nml Nml Nml Nml Nml Nml Nml Nml N/A  Left Ext Indicis Nml Nml Nml Nml Nml Nml Nml Nml Nml Nml Nml Nml N/A      Waveforms:

## 2014-02-16 ENCOUNTER — Telehealth: Payer: Self-pay | Admitting: Neurology

## 2014-02-16 NOTE — Telephone Encounter (Signed)
Pt cancel 6 week follow up sch for Thursday of this, he did not want to r/s / Sherri S.

## 2014-02-19 ENCOUNTER — Ambulatory Visit: Payer: Medicare Other | Admitting: Neurology

## 2014-02-20 ENCOUNTER — Ambulatory Visit: Payer: Medicare Other | Admitting: Nurse Practitioner

## 2014-02-23 ENCOUNTER — Other Ambulatory Visit: Payer: Self-pay | Admitting: Cardiovascular Disease

## 2014-02-26 ENCOUNTER — Other Ambulatory Visit: Payer: Self-pay

## 2014-02-26 ENCOUNTER — Other Ambulatory Visit: Payer: Self-pay | Admitting: *Deleted

## 2014-02-26 MED ORDER — WARFARIN SODIUM 5 MG PO TABS: ORAL_TABLET | ORAL | Status: DC

## 2014-02-26 MED ORDER — LEVOTHYROXINE SODIUM 88 MCG PO TABS: ORAL_TABLET | ORAL | Status: DC

## 2014-02-26 MED ORDER — TRAZODONE HCL 100 MG PO TABS: ORAL_TABLET | ORAL | Status: DC

## 2014-02-26 MED ORDER — AMLODIPINE BESYLATE 5 MG PO TABS: ORAL_TABLET | ORAL | Status: DC

## 2014-03-03 ENCOUNTER — Telehealth: Payer: Self-pay | Admitting: Neurology

## 2014-03-03 NOTE — Telephone Encounter (Signed)
Pt called/returning your call at 3:03PM [phone]

## 2014-03-03 NOTE — Telephone Encounter (Signed)
Patient wants to finish the test.  Will you call and schedule this for 60 minutes please?

## 2014-03-03 NOTE — Telephone Encounter (Signed)
Let me know and I will call him back.

## 2014-03-03 NOTE — Telephone Encounter (Signed)
Please call pt to sch 60 min EMG / Sherri  °

## 2014-03-03 NOTE — Telephone Encounter (Signed)
Left message for patient to call me back. 

## 2014-03-03 NOTE — Telephone Encounter (Signed)
Patient only tolerated half of the test and asked to stop the needle portion due to pain.  If he chooses to have the test completed, he will need 60-min.  Donika K. Allena KatzPatel, DO

## 2014-03-03 NOTE — Telephone Encounter (Signed)
Pt says he still needs to finish his NCV, says it was not completed at last visit. Is this correct? How much time does he need? CB# [phone] / Roanna RaiderSherri

## 2014-03-26 ENCOUNTER — Other Ambulatory Visit: Payer: Self-pay | Admitting: Cardiovascular Disease

## 2014-04-02 ENCOUNTER — Ambulatory Visit: Payer: Medicare Other | Admitting: Nurse Practitioner

## 2014-04-02 ENCOUNTER — Encounter: Payer: Self-pay | Admitting: Nurse Practitioner

## 2014-04-02 ENCOUNTER — Ambulatory Visit (INDEPENDENT_AMBULATORY_CARE_PROVIDER_SITE_OTHER): Payer: Medicare Other | Admitting: Nurse Practitioner

## 2014-04-02 VITALS — BP 110/64 | HR 64 | Temp 97.8°F | Ht 66.75 in | Wt 234.0 lb

## 2014-04-02 DIAGNOSIS — E785 Hyperlipidemia, unspecified: Secondary | ICD-10-CM

## 2014-04-02 DIAGNOSIS — I10 Essential (primary) hypertension: Secondary | ICD-10-CM

## 2014-04-02 DIAGNOSIS — E119 Type 2 diabetes mellitus without complications: Secondary | ICD-10-CM

## 2014-04-02 DIAGNOSIS — N189 Chronic kidney disease, unspecified: Secondary | ICD-10-CM

## 2014-04-02 DIAGNOSIS — Z954 Presence of other heart-valve replacement: Secondary | ICD-10-CM

## 2014-04-02 DIAGNOSIS — E871 Hypo-osmolality and hyponatremia: Secondary | ICD-10-CM

## 2014-04-02 DIAGNOSIS — I4891 Unspecified atrial fibrillation: Secondary | ICD-10-CM

## 2014-04-02 DIAGNOSIS — E039 Hypothyroidism, unspecified: Secondary | ICD-10-CM

## 2014-04-02 DIAGNOSIS — E663 Overweight: Secondary | ICD-10-CM

## 2014-04-02 DIAGNOSIS — Z713 Dietary counseling and surveillance: Secondary | ICD-10-CM

## 2014-04-02 LAB — POCT GLYCOSYLATED HEMOGLOBIN (HGB A1C): HEMOGLOBIN A1C: 6.3

## 2014-04-02 LAB — POCT INR: INR: 3.2

## 2014-04-02 NOTE — Progress Notes (Signed)
Subjective:    Patient ID: Andre Malone, male    DOB: 02/23/1951, 63 y.o.   MRN: [id number]  Patient presents today for follow up of chronic medical problems -New pacemaker placed in 09/17/13 with new leads   Diabetes He presents for his follow-up diabetic visit. He has type 2 diabetes mellitus. No MedicAlert identification noted. The initial diagnosis of diabetes was made 10 years ago. His disease course has been fluctuating. There are no hypoglycemic associated symptoms. Pertinent negatives for hypoglycemia include no dizziness. Pertinent negatives for diabetes include no chest pain, no fatigue, no polydipsia, no polyphagia, no polyuria, no visual change, no weakness and no weight loss. There are no hypoglycemic complications. Symptoms are stable. Diabetic complications include heart disease. Pertinent negatives for diabetic complications include no peripheral neuropathy. Risk factors for coronary artery disease include dyslipidemia, hypertension, male sex and sedentary lifestyle. Current diabetic treatment includes oral agent (monotherapy) and insulin injections. He is compliant with treatment most of the time. He is currently taking insulin at bedtime. Insulin injections are given by patient. Rotation sites for injection include the abdominal wall. His weight is stable. He is following a diabetic diet. When asked about meal planning, he reported none. He has not had a previous visit with a dietician. He never participates in exercise. His home blood glucose trend is fluctuating minimally. His breakfast blood glucose is taken between 8-9 am. His breakfast blood glucose range is generally 90-110 mg/dl. An ACE inhibitor/angiotensin II receptor blocker is contraindicated. He does not see a podiatrist.Eye exam is current (Has appt scheduled for 12/05/13).  Hypertension This is a chronic problem. The current episode started more than 1 year ago. The problem is unchanged. The problem is controlled.  Pertinent negatives include no chest pain, malaise/fatigue, orthopnea, palpitations, peripheral edema, PND or shortness of breath. There are no associated agents to hypertension. Risk factors for coronary artery disease include diabetes mellitus, dyslipidemia, male gender and sedentary lifestyle. Past treatments include angiotensin blockers. The current treatment provides moderate improvement. Compliance problems include diet and exercise.  Hypertensive end-organ damage includes CAD/MI and a thyroid problem.  Hyperlipidemia This is a chronic problem. The current episode started more than 1 year ago. The problem is controlled. Exacerbating diseases include diabetes and hypothyroidism. There are no known factors aggravating his hyperlipidemia. Pertinent negatives include no chest pain, leg pain, myalgias or shortness of breath. Current antihyperlipidemic treatment includes statins and exercise. Compliance problems include adherence to diet and adherence to exercise.  Risk factors for coronary artery disease include hypertension, diabetes mellitus and male sex.  Thyroid Problem Presents for follow-up visit. Patient reports no constipation, diaphoresis, diarrhea, dry skin, fatigue, leg swelling, palpitations, visual change or weight loss. The symptoms have been stable. His past medical history is significant for diabetes and hyperlipidemia.  Benign Prostatic Hypertrophy This is a chronic problem. The problem has been resolved since onset. Irritative symptoms do not include nocturia or urgency. Obstructive symptoms do not include dribbling. Pertinent negatives include no hematuria or hesitancy. He is sexually active. Nothing aggravates the symptoms. Past treatments include nothing. The treatment provided significant relief. He has been using treatment for 2 or more years.  depression Currently on zoloft- Doing well -no C/O side effects RLS Mirapex and tramadol - helps a lot with symptoms insomnia trazadone to  sleep everynight- feels rested in AM Aortic Valve On coumadin- Denies bleeding   Review of Systems  Constitutional: Negative for weight loss, malaise/fatigue, diaphoresis and fatigue.  Respiratory: Negative for cough and  shortness of breath.   Cardiovascular: Negative for chest pain, palpitations, orthopnea and PND.  Gastrointestinal: Negative for diarrhea, constipation and blood in stool.  Endocrine: Negative for polydipsia, polyphagia and polyuria.  Genitourinary: Negative for hesitancy, urgency, hematuria and nocturia.  Musculoskeletal: Negative for myalgias.  Neurological: Negative for dizziness and weakness.  Psychiatric/Behavioral: Negative for sleep disturbance.  All other systems reviewed and are negative.      Objective:   Physical Exam  Constitutional: He is oriented to person, place, and time. He appears well-developed and well-nourished.  HENT:  Head: Normocephalic.  Right Ear: External ear normal.  Left Ear: External ear normal.  Nose: Nose normal.  Mouth/Throat: Oropharynx is clear and moist.  Eyes: EOM are normal. Pupils are equal, round, and reactive to light.  Neck: Normal range of motion. Neck supple. No thyromegaly present.  Cardiovascular: Normal rate, regular rhythm, normal heart sounds and intact distal pulses.   No murmur heard. Pulmonary/Chest: Effort normal and breath sounds normal. He has no wheezes. He has no rales.  Abdominal: Soft. Bowel sounds are normal.  Musculoskeletal: Normal range of motion. He exhibits edema (1+ edema bil lower ext).  Neurological: He is alert and oriented to person, place, and time.  Skin: Skin is warm and dry.  Psychiatric: He has a normal mood and affect. His behavior is normal. Judgment and thought content normal.    BP 110/64  Pulse 64  Temp(Src) 97.8 F (36.6 C) (Oral)  Ht 5' 6.75" (1.695 m)  Wt 234 lb (106.142 kg)  BMI 36.94 kg/m2  Results for orders placed in visit on 04/02/14  POCT GLYCOSYLATED HEMOGLOBIN  (HGB A1C)      Result Value Ref Range   Hemoglobin A1C 6.3%    POCT INR      Result Value Ref Range   INR 3.2          Assessment & Plan:   1. DIABETES MELLITUS, TYPE II   2. HYPERLIPIDEMIA-MIXED   3. Essential hypertension   4. A-fib   5. RENAL INSUFFICIENCY, CHRONIC   6. PROSTHETIC VALVE-MECHANICAL   7. OVERWEIGHT/OBESITY   8. HYPOTHYROIDISM   9. Hyponatremia   10. Weight loss counseling, encounter for    Orders Placed This Encounter  Procedures  . CMP14+EGFR  . NMR, lipoprofile  . POCT glycosylated hemoglobin (Hb A1C)    Labs pending Health maintenance reviewed Diet and exercise encouraged Continue all meds Follow up  In 3 months   [place], FNP

## 2014-04-02 NOTE — Patient Instructions (Signed)
Anticoagulation Dose Instructions as of 04/02/2014     Andre SmilesSun Mon Tue Wed Thu Fri Sat   New Dose 5 mg 7.5 mg 5 mg 5 mg 7.5 mg 5 mg 5 mg    Description       No warfarin today, then decrease warfarin dose to 1 and 1/2 tablets on Thursdays, 1 tablet all other days.        INR was 3.2 today

## 2014-04-03 ENCOUNTER — Telehealth: Payer: Self-pay | Admitting: Neurology

## 2014-04-03 ENCOUNTER — Other Ambulatory Visit: Payer: Self-pay | Admitting: *Deleted

## 2014-04-03 DIAGNOSIS — M79602 Pain in left arm: Secondary | ICD-10-CM

## 2014-04-03 LAB — NMR, LIPOPROFILE
Cholesterol: 189 mg/dL (ref 100–199)
HDL Cholesterol by NMR: 37 mg/dL — ABNORMAL LOW (ref >39)
HDL PARTICLE NUMBER: 31.7 umol/L (ref >=30.5)
LDL Particle Number: 1316 nmol/L — ABNORMAL HIGH (ref <1000)
LDL SIZE: 20.2 nm (ref >20.5)
LDLC SERPL CALC-MCNC: 94 mg/dL (ref 0–99)
LP-IR SCORE: 79 — AB (ref <=45)
Small LDL Particle Number: 709 nmol/L — ABNORMAL HIGH (ref <=527)
Triglycerides by NMR: 289 mg/dL — ABNORMAL HIGH (ref 0–149)

## 2014-04-03 LAB — CMP14+EGFR
A/G RATIO: 1.5 (ref 1.1–2.5)
ALK PHOS: 75 IU/L (ref 39–117)
ALT: 14 IU/L (ref 0–44)
AST: 19 IU/L (ref 0–40)
Albumin: 4.2 g/dL (ref 3.6–4.8)
BUN / CREAT RATIO: 11 (ref 10–22)
BUN: 11 mg/dL (ref 8–27)
CO2: 26 mmol/L (ref 18–29)
CREATININE: 0.97 mg/dL (ref 0.76–1.27)
Calcium: 9.7 mg/dL (ref 8.6–10.2)
Chloride: 99 mmol/L (ref 97–108)
GFR calc Af Amer: 96 mL/min/{1.73_m2} (ref >59)
GFR, EST NON AFRICAN AMERICAN: 83 mL/min/{1.73_m2} (ref >59)
GLOBULIN, TOTAL: 2.8 g/dL (ref 1.5–4.5)
Glucose: 179 mg/dL — ABNORMAL HIGH (ref 65–99)
Potassium: 4.4 mmol/L (ref 3.5–5.2)
Sodium: 140 mmol/L (ref 134–144)
Total Bilirubin: 0.4 mg/dL (ref 0.0–1.2)
Total Protein: 7 g/dL (ref 6.0–8.5)

## 2014-04-03 NOTE — Telephone Encounter (Signed)
Pt canc 04/09/14 [facility] appt w/ Dr. Allena KatzPatel. He did not wish to r/s / Sherri S.

## 2014-04-09 ENCOUNTER — Encounter: Payer: Medicare Other | Admitting: Neurology

## 2014-05-04 ENCOUNTER — Ambulatory Visit: Payer: Self-pay

## 2014-05-06 ENCOUNTER — Ambulatory Visit (INDEPENDENT_AMBULATORY_CARE_PROVIDER_SITE_OTHER): Payer: Medicare Other | Admitting: *Deleted

## 2014-05-06 ENCOUNTER — Encounter: Payer: Self-pay | Admitting: Internal Medicine

## 2014-05-06 DIAGNOSIS — I442 Atrioventricular block, complete: Secondary | ICD-10-CM

## 2014-05-06 NOTE — Progress Notes (Signed)
Remote pacemaker transmission.   

## 2014-05-08 LAB — MDC_IDC_ENUM_SESS_TYPE_REMOTE
Battery Remaining Percentage: 95.5 %
Battery Voltage: 3.02 V
Brady Statistic AP VP Percent: 6.6 %
Brady Statistic AP VS Percent: 1 %
Brady Statistic AS VP Percent: 93 %
Brady Statistic RA Percent Paced: 6.5 %
Date Time Interrogation Session: 20150902080011
Implantable Pulse Generator Model: 2240
Implantable Pulse Generator Serial Number: 7586540
Lead Channel Impedance Value: 610 Ohm
Lead Channel Pacing Threshold Amplitude: 1 V
Lead Channel Pacing Threshold Pulse Width: 0.5 ms
Lead Channel Sensing Intrinsic Amplitude: 12 mV
Lead Channel Sensing Intrinsic Amplitude: 4.2 mV
Lead Channel Setting Pacing Amplitude: 2.5 V
Lead Channel Setting Sensing Sensitivity: 5 mV
MDC IDC MSMT BATTERY REMAINING LONGEVITY: 106 mo
MDC IDC MSMT LEADCHNL RA IMPEDANCE VALUE: 380 Ohm
MDC IDC MSMT LEADCHNL RV PACING THRESHOLD AMPLITUDE: 0.5 V
MDC IDC MSMT LEADCHNL RV PACING THRESHOLD PULSEWIDTH: 0.5 ms
MDC IDC SET LEADCHNL RA PACING AMPLITUDE: 2 V
MDC IDC SET LEADCHNL RV PACING PULSEWIDTH: 0.5 ms
MDC IDC STAT BRADY AS VS PERCENT: 1 %
MDC IDC STAT BRADY RV PERCENT PACED: 99 %

## 2014-05-14 ENCOUNTER — Encounter: Payer: Self-pay | Admitting: Cardiology

## 2014-05-21 ENCOUNTER — Telehealth: Payer: Self-pay | Admitting: Cardiology

## 2014-05-21 NOTE — Telephone Encounter (Signed)
Pt called and stated that he received a phone call stating that his lead impedance was low and that he needed to see MD. But then a received my chart e-mail that stated everything was within normal limits and his next remote transmission was 12-7. I informed pt that I send e-mail / letters that are given to me to send and that everything is very generic. I looked at pts last transmission and seen that the lead impedance was lower than normal limits. I informed pt that his name was probably given to me by mistake and that his lead impedance is in fact lower than normal limits and that he needs to keep his 9-22 appt. with MD. He verbalized understanding.

## 2014-05-26 ENCOUNTER — Ambulatory Visit (INDEPENDENT_AMBULATORY_CARE_PROVIDER_SITE_OTHER): Payer: Medicare Other | Admitting: *Deleted

## 2014-05-26 DIAGNOSIS — I480 Paroxysmal atrial fibrillation: Secondary | ICD-10-CM

## 2014-05-26 DIAGNOSIS — I442 Atrioventricular block, complete: Secondary | ICD-10-CM

## 2014-05-26 DIAGNOSIS — I4891 Unspecified atrial fibrillation: Secondary | ICD-10-CM

## 2014-05-26 LAB — MDC_IDC_ENUM_SESS_TYPE_INCLINIC
Battery Voltage: 3.01 V
Date Time Interrogation Session: 20150922144750
Implantable Pulse Generator Model: 2240
Implantable Pulse Generator Serial Number: 7586540
Lead Channel Pacing Threshold Amplitude: 0.5 V
Lead Channel Pacing Threshold Amplitude: 1 V
Lead Channel Pacing Threshold Pulse Width: 0.5 ms
Lead Channel Pacing Threshold Pulse Width: 0.5 ms
Lead Channel Sensing Intrinsic Amplitude: 8.5 mV
Lead Channel Setting Pacing Amplitude: 2 V
Lead Channel Setting Pacing Amplitude: 2.5 V
Lead Channel Setting Pacing Pulse Width: 0.5 ms
Lead Channel Setting Sensing Sensitivity: 4 mV
MDC IDC MSMT BATTERY REMAINING LONGEVITY: 104.4 mo
MDC IDC MSMT LEADCHNL RA IMPEDANCE VALUE: 375 Ohm
MDC IDC MSMT LEADCHNL RA SENSING INTR AMPL: 2.7 mV
MDC IDC MSMT LEADCHNL RV IMPEDANCE VALUE: 550 Ohm
MDC IDC STAT BRADY RA PERCENT PACED: 9 %
MDC IDC STAT BRADY RV PERCENT PACED: 99.93 %

## 2014-05-26 NOTE — Progress Notes (Signed)
Pacemaker check in clinic for low atrial lead impedance alert. RA lead impedance on todays check 360-380. Normal sensing and threshold testing. 1349 mode switch episodes--all atrial noise reversions. Follow up as planned.

## 2014-06-05 ENCOUNTER — Ambulatory Visit (INDEPENDENT_AMBULATORY_CARE_PROVIDER_SITE_OTHER): Payer: Medicare Other | Admitting: Pharmacist

## 2014-06-05 ENCOUNTER — Encounter: Payer: Self-pay | Admitting: Pharmacist

## 2014-06-05 VITALS — BP 122/78 | HR 70 | Ht 66.75 in | Wt 229.0 lb

## 2014-06-05 DIAGNOSIS — Z23 Encounter for immunization: Secondary | ICD-10-CM

## 2014-06-05 DIAGNOSIS — I480 Paroxysmal atrial fibrillation: Secondary | ICD-10-CM

## 2014-06-05 DIAGNOSIS — G2581 Restless legs syndrome: Secondary | ICD-10-CM

## 2014-06-05 DIAGNOSIS — I4891 Unspecified atrial fibrillation: Secondary | ICD-10-CM

## 2014-06-05 DIAGNOSIS — Z0000 Encounter for general adult medical examination without abnormal findings: Secondary | ICD-10-CM

## 2014-06-05 LAB — POCT INR: INR: 2.9

## 2014-06-05 NOTE — Patient Instructions (Addendum)
Anticoagulation Dose Instructions as of 06/05/2014     Andre Malone Tue Wed Thu Fri Sat   New Dose 5 mg 7.5 mg 5 mg 5 mg 7.5 mg 5 mg 5 mg    Description       Continue current warfarin dose of 1 and 1/2 tablets on Thursdays, 1 tablet all other days.        INR was 2.9 today   Health Maintenance Summary    INFLUENZA VACCINE Next due Fall 2016 Given today 06/05/2014    ZOSTAVAX / Shingles vaccine Postponed 09/14/2014 Cost verified today - $95    HEMOGLOBIN A1C Next Due 10/03/2014  Last was 6.3% (04/02/2014)    URINE MICROALBUMIN Next Due 11/20/2014  Last was 11/2013    OPHTHALMOLOGY EXAM Next Due 02/01/2015  Last was 01/2014    FOOT EXAM Next Due 04/03/2015  Last was 03/2014    PNEUMOCOCCAL VACCINE Next Due 11/19/2016  Last was 11/20/2011   Colonoscopy Not recommended ue to need for  warfarin   Hemocult (colon cancer screening) Per pt done at [facility]  Patient to bring in report    TETANUS/TDAP Next Due 09/16/2021  Last was 09/17/2011      Preventive Care for Adults A healthy lifestyle and preventive care can promote health and wellness. Preventive health guidelines for men include the following key practices:  A routine yearly physical is a good way to check with your health care provider about your health and preventative screening. It is a chance to share any concerns and updates on your health and to receive a thorough exam.  Visit your dentist for a routine exam and preventative care every 6 months. Brush your teeth twice a day and floss once a day. Good oral hygiene prevents tooth decay and gum disease.  The frequency of eye exams is based on your age, health, family medical history, use of contact lenses, and other factors. Follow your health care provider's recommendations for frequency of eye exams.  Eat a healthy diet. Foods such as vegetables, fruits, whole grains, low-fat dairy products, and lean protein foods contain the nutrients you need without too many calories. Decrease your intake  of foods high in solid fats, added sugars, and salt. Eat the right amount of calories for you.Get information about a proper diet from your health care provider, if necessary.  Regular physical exercise is one of the most important things you can do for your health. Most adults should get at least 150 minutes of moderate-intensity exercise (any activity that increases your heart rate and causes you to sweat) each week. In addition, most adults need muscle-strengthening exercises on 2 or more days a week.  Maintain a healthy weight. The body mass index (BMI) is a screening tool to identify possible weight problems. It provides an estimate of body fat based on height and weight. Your health care provider can find your BMI and can help you achieve or maintain a healthy weight.For adults 20 years and older:  A BMI below 18.5 is considered underweight.  A BMI of 18.5 to 24.9 is normal.  A BMI of 25 to 29.9 is considered overweight.  A BMI of 30 and above is considered obese.  Maintain normal blood lipids and cholesterol levels by exercising and minimizing your intake of saturated fat. Eat a balanced diet with plenty of fruit and vegetables. Blood tests for lipids and cholesterol should begin at age 4 and be repeated every 5 years. If your lipid or cholesterol levels  are high, you are over 50, or you are at high risk for heart disease, you may need your cholesterol levels checked more frequently.Ongoing high lipid and cholesterol levels should be treated with medicines if diet and exercise are not working.  If you smoke, find out from your health care provider how to quit. If you do not use tobacco, do not start.  Lung cancer screening is recommended for adults aged 86-80 years who are at high risk for developing lung cancer because of a history of smoking. A yearly low-dose CT scan of the lungs is recommended for people who have at least a 30-pack-year history of smoking and are a current smoker or  have quit within the past 15 years. A pack year of smoking is smoking an average of 1 pack of cigarettes a day for 1 year (for example: 1 pack a day for 30 years or 2 packs a day for 15 years). Yearly screening should continue until the smoker has stopped smoking for at least 15 years. Yearly screening should be stopped for people who develop a health problem that would prevent them from having lung cancer treatment.  If you choose to drink alcohol, do not have more than 2 drinks per day. One drink is considered to be 12 ounces (355 mL) of beer, 5 ounces (148 mL) of wine, or 1.5 ounces (44 mL) of liquor.  Avoid use of street drugs. Do not share needles with anyone. Ask for help if you need support or instructions about stopping the use of drugs.  High blood pressure causes heart disease and increases the risk of stroke. Your blood pressure should be checked at least every 1-2 years. Ongoing high blood pressure should be treated with medicines, if weight loss and exercise are not effective.  If you are 35-58 years old, ask your health care provider if you should take aspirin to prevent heart disease.  Diabetes screening involves taking a blood sample to check your fasting blood sugar level. This should be done once every 3 years, after age 76, if you are within normal weight and without risk factors for diabetes. Testing should be considered at a younger age or be carried out more frequently if you are overweight and have at least 1 risk factor for diabetes.  Colorectal cancer can be detected and often prevented. Most routine colorectal cancer screening begins at the age of 78 and continues through age 26. However, your health care provider may recommend screening at an earlier age if you have risk factors for colon cancer. On a yearly basis, your health care provider may provide home test kits to check for hidden blood in the stool. Use of a small camera at the end of a tube to directly examine the colon  (sigmoidoscopy or colonoscopy) can detect the earliest forms of colorectal cancer. Talk to your health care provider about this at age 65, when routine screening begins. Direct exam of the colon should be repeated every 5-10 years through age 29, unless early forms of precancerous polyps or small growths are found.  People who are at an increased risk for hepatitis B should be screened for this virus. You are considered at high risk for hepatitis B if:  You were born in a country where hepatitis B occurs often. Talk with your health care provider about which countries are considered high risk.  Your parents were born in a high-risk country and you have not received a shot to protect against hepatitis B (hepatitis  B vaccine).  You have HIV or AIDS.  You use needles to inject street drugs.  You live with, or have sex with, someone who has hepatitis B.  You are a man who has sex with other men (MSM).  You get hemodialysis treatment.  You take certain medicines for conditions such as cancer, organ transplantation, and autoimmune conditions.  Hepatitis C blood testing is recommended for all people born from [birth year] through 1965 and any individual with known risks for hepatitis C.  Practice safe sex. Use condoms and avoid high-risk sexual practices to reduce the spread of sexually transmitted infections (STIs). STIs include gonorrhea, chlamydia, syphilis, trichomonas, herpes, HPV, and human immunodeficiency virus (HIV). Herpes, HIV, and HPV are viral illnesses that have no cure. They can result in disability, cancer, and death.  If you are at risk of being infected with HIV, it is recommended that you take a prescription medicine daily to prevent HIV infection. This is called preexposure prophylaxis (PrEP). You are considered at risk if:  You are a man who has sex with other men (MSM) and have other risk factors.  You are a heterosexual man, are sexually active, and are at increased risk for HIV  infection.  You take drugs by injection.  You are sexually active with a partner who has HIV.  Talk with your health care provider about whether you are at high risk of being infected with HIV. If you choose to begin PrEP, you should first be tested for HIV. You should then be tested every 3 months for as long as you are taking PrEP.  A one-time screening for abdominal aortic aneurysm (AAA) and surgical repair of large AAAs by ultrasound are recommended for men ages 17 to 65 years who are current or former smokers.  Healthy men should no longer receive prostate-specific antigen (PSA) blood tests as part of routine cancer screening. Talk with your health care provider about prostate cancer screening.  Testicular cancer screening is not recommended for adult males who have no symptoms. Screening includes self-exam, a health care provider exam, and other screening tests. Consult with your health care provider about any symptoms you have or any concerns you have about testicular cancer.  Use sunscreen. Apply sunscreen liberally and repeatedly throughout the day. You should seek shade when your shadow is shorter than you. Protect yourself by wearing long sleeves, pants, a wide-brimmed hat, and sunglasses year round, whenever you are outdoors.  Once a month, do a whole-body skin exam, using a mirror to look at the skin on your back. Tell your health care provider about new moles, moles that have irregular borders, moles that are larger than a pencil eraser, or moles that have changed in shape or color.  Stay current with required vaccines (immunizations).  Influenza vaccine. All adults should be immunized every year.  Tetanus, diphtheria, and acellular pertussis (Td, Tdap) vaccine. An adult who has not previously received Tdap or who does not know his vaccine status should receive 1 dose of Tdap. This initial dose should be followed by tetanus and diphtheria toxoids (Td) booster doses every 10 years.  Adults with an unknown or incomplete history of completing a 3-dose immunization series with Td-containing vaccines should begin or complete a primary immunization series including a Tdap dose. Adults should receive a Td booster every 10 years.  Varicella vaccine. An adult without evidence of immunity to varicella should receive 2 doses or a second dose if he has previously received 1 dose.  Human  papillomavirus (HPV) vaccine. Males aged 27-21 years who have not received the vaccine previously should receive the 3-dose series. Males aged 22-26 years may be immunized. Immunization is recommended through the age of 21 years for any male who has sex with males and did not get any or all doses earlier. Immunization is recommended for any person with an immunocompromised condition through the age of 66 years if he did not get any or all doses earlier. During the 3-dose series, the second dose should be obtained 4-8 weeks after the first dose. The third dose should be obtained 24 weeks after the first dose and 16 weeks after the second dose.  Zoster vaccine. One dose is recommended for adults aged 21 years or older unless certain conditions are present.  Measles, mumps, and rubella (MMR) vaccine. Adults born before [birth year] generally are considered immune to measles and mumps. Adults born in [birth year] or later should have 1 or more doses of MMR vaccine unless there is a contraindication to the vaccine or there is laboratory evidence of immunity to each of the three diseases. A routine second dose of MMR vaccine should be obtained at least 28 days after the first dose for students attending postsecondary schools, health care workers, or international travelers. People who received inactivated measles vaccine or an unknown type of measles vaccine during 1963-1967 should receive 2 doses of MMR vaccine. People who received inactivated mumps vaccine or an unknown type of mumps vaccine before 1979 and are at high risk for mumps  infection should consider immunization with 2 doses of MMR vaccine. Unvaccinated health care workers born before [birth year] who lack laboratory evidence of measles, mumps, or rubella immunity or laboratory confirmation of disease should consider measles and mumps immunization with 2 doses of MMR vaccine or rubella immunization with 1 dose of MMR vaccine.  Pneumococcal 13-valent conjugate (PCV13) vaccine. When indicated, a person who is uncertain of his immunization history and has no record of immunization should receive the PCV13 vaccine. An adult aged 47 years or older who has certain medical conditions and has not been previously immunized should receive 1 dose of PCV13 vaccine. This PCV13 should be followed with a dose of pneumococcal polysaccharide (PPSV23) vaccine. The PPSV23 vaccine dose should be obtained at least 8 weeks after the dose of PCV13 vaccine. An adult aged 31 years or older who has certain medical conditions and previously received 1 or more doses of PPSV23 vaccine should receive 1 dose of PCV13. The PCV13 vaccine dose should be obtained 1 or more years after the last PPSV23 vaccine dose.  Pneumococcal polysaccharide (PPSV23) vaccine. When PCV13 is also indicated, PCV13 should be obtained first. All adults aged 53 years and older should be immunized. An adult younger than age 34 years who has certain medical conditions should be immunized. Any person who resides in a nursing home or long-term care facility should be immunized. An adult smoker should be immunized. People with an immunocompromised condition and certain other conditions should receive both PCV13 and PPSV23 vaccines. People with human immunodeficiency virus (HIV) infection should be immunized as soon as possible after diagnosis. Immunization during chemotherapy or radiation therapy should be avoided. Routine use of PPSV23 vaccine is not recommended for American Indians, [place] Natives, or people younger than 65 years unless there are  medical conditions that require PPSV23 vaccine. When indicated, people who have unknown immunization and have no record of immunization should receive PPSV23 vaccine. One-time revaccination 5 years after the first dose of PPSV23  is recommended for people aged 19-64 years who have chronic kidney failure, nephrotic syndrome, asplenia, or immunocompromised conditions. People who received 1-2 doses of PPSV23 before age 7 years should receive another dose of PPSV23 vaccine at age 22 years or later if at least 5 years have passed since the previous dose. Doses of PPSV23 are not needed for people immunized with PPSV23 at or after age 37 years.  Meningococcal vaccine. Adults with asplenia or persistent complement component deficiencies should receive 2 doses of quadrivalent meningococcal conjugate (MenACWY-D) vaccine. The doses should be obtained at least 2 months apart. Microbiologists working with certain meningococcal bacteria, [employer], people at risk during an outbreak, and people who travel to or live in countries with a high rate of meningitis should be immunized. A first-year college student up through age 28 years who is living in a residence hall should receive a dose if he did not receive a dose on or after his 16th birthday. Adults who have certain high-risk conditions should receive one or more doses of vaccine.  Hepatitis A vaccine. Adults who wish to be protected from this disease, have certain high-risk conditions, work with hepatitis A-infected animals, work in hepatitis A research labs, or travel to or work in countries with a high rate of hepatitis A should be immunized. Adults who were previously unvaccinated and who anticipate close contact with an international adoptee during the first 60 days after arrival in the [place] from a country with a high rate of hepatitis A should be immunized.  Hepatitis B vaccine. Adults should be immunized if they wish to be protected from this  disease, have certain high-risk conditions, may be exposed to blood or other infectious body fluids, are household contacts or sex partners of hepatitis B positive people, are clients or workers in certain care facilities, or travel to or work in countries with a high rate of hepatitis B. Preventive Service / Frequency Ages 54 and over  Blood pressure check.** / Every 1 to 2 years.  Lipid and cholesterol check.**/ Every 5 years beginning at age 5.  Lung cancer screening. / Every year if you are aged 41-80 years and have a 30-pack-year history of smoking and currently smoke or have quit within the past 15 years. Yearly screening is stopped once you have quit smoking for at least 15 years or develop a health problem that would prevent you from having lung cancer treatment.  Fecal occult blood test (FOBT) of stool. / Every year beginning at age 54 and continuing until age 44. You may not have to do this test if you get a colonoscopy every 10 years.  Flexible sigmoidoscopy** or colonoscopy.** / Every 5 years for a flexible sigmoidoscopy or every 10 years for a colonoscopy beginning at age 16 and continuing until age 67.  Hepatitis C blood test.** / For all people born from [birth year] through 1965 and any individual with known risks for hepatitis C.  Abdominal aortic aneurysm (AAA) screening.** / A one-time screening for ages 58 to 36 years who are current or former smokers.  Skin self-exam. / Monthly.  Influenza vaccine. / Every year.  Tetanus, diphtheria, and acellular pertussis (Tdap/Td) vaccine.** / 1 dose of Td every 10 years.  Varicella vaccine.** / Consult your health care provider.  Zoster vaccine.** / 1 dose for adults aged 69 years or older.  Pneumococcal 13-valent conjugate (PCV13) vaccine.** / Consult your health care provider.  Pneumococcal polysaccharide (PPSV23) vaccine.** / 1 dose for all adults aged  93 years and older.  Meningococcal vaccine.** / Consult your health care  provider.  Hepatitis A vaccine.** / Consult your health care provider.  Hepatitis B vaccine.** / Consult your health care provider.  Haemophilus influenzae type b (Hib) vaccine.** / Consult your health care provider. **Family history and personal history of risk and conditions may change your health care provider's recommendations. Document Released: 10/17/2001 Document Revised: 08/26/2013 Document Reviewed: 01/16/2011 [facility] Patient Information 2015 [place], [place]. This information is not intended to replace advice given to you by your health care provider. Make sure you discuss any questions you have with your health care provider.

## 2014-06-05 NOTE — Progress Notes (Signed)
Subjective:    Andre Malone is a 63 y.o. male who presents for Medicare Initial Wellness Visit and recheck Protime.  Patient states he has been out of town in [place] recently.  His brother was in a motorcycle accident and after several days in hospital he passed away.  While he was in [place] Mr. Haq had INR checked at [employer] [facility].  When assessing patient depression scores it was higher then usual but much is related to his brother death. He is dealing well with this loss.    Preventive Screening-Counseling & Management  Tobacco History  Smoking status  . Never Smoker   Smokeless tobacco  . Never Used    Current Problems (verified) Patient Active Problem List   Diagnosis Date Noted  . Atrioventricular block, complete 09/17/2013  . Pacemaker  st Judes   . 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 S/P AVR (aortic valve replacement) 08/22/2013  . HTN (hypertension) 08/22/2013  . NSTEMI (non-ST elevated myocardial infarction) 05/29/2013  . A-fib 11/20/2012  . Avascular necrosis of bone 02/29/2012  . Acute blood loss anemia 02/29/2012  . Hyponatremia 02/29/2012  . CHEST PAIN 09/27/2010  . PROSTHETIC VALVE-MECHANICAL 06/22/2009  . 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 CAD, NATIVE VESSEL 12/28/2008  . HYPOTHYROIDISM 12/23/2008  . DIABETES MELLITUS, TYPE II 12/23/2008  . HYPERLIPIDEMIA-MIXED 12/23/2008  . OVERWEIGHT/OBESITY 12/23/2008  . POST TRAUMATIC STRESS SYNDROME 12/23/2008  . BLINDNESS, RIGHT EYE 12/23/2008  . CAD, UNSPECIFIED SITE 12/23/2008  . Aortic valve disorders 12/23/2008  . THORACIC AORTIC ANEURYSM 12/23/2008  . RENAL INSUFFICIENCY, CHRONIC 12/23/2008  . SYNCOPE 12/23/2008  . HEADACHE, CHRONIC 12/23/2008    Medications Prior to Visit Current Outpatient Prescriptions on File Prior to Visit  Medication Sig Dispense Refill  . aspirin EC 81 MG EC tablet Take 1 tablet (81 mg total) by mouth daily.      .  insulin glargine (LANTUS) 100 UNIT/ML injection Inject 50 Units into the skin daily after supper.       . levothyroxine (SYNTHROID, LEVOTHROID) 88 MCG tablet TAKE ONE TABLET BY MOUTH ONCE DAILY  90 tablet  1  . metFORMIN (GLUCOPHAGE) 1000 MG tablet Take 1 tablet (1,000 mg total) by mouth 2 (two) times daily with a meal.      . methocarbamol (ROBAXIN) 500 MG tablet Take 500 mg by mouth every 6 (six) hours as needed for muscle spasms.       . Multiple Vitamins-Minerals (ADULT GUMMY PO) Take 2 tablets by mouth.      . nitroGLYCERIN (NITROSTAT) 0.4 MG SL tablet Place 1 tablet (0.4 mg total) under the tongue every 5 (five) minutes x 3 doses as needed for chest pain.  25 tablet  12  . Oxycodone HCl 10 MG TABS Take 10 mg by mouth every 6 (six) hours as needed.       . pramipexole (MIRAPEX) 0.25 MG tablet Take 2 tablets by mouth daily as needed (restless leg).       . pravastatin (PRAVACHOL) 40 MG tablet TAKE 1 TABLET DAILY IN EVENING  30 tablet  5  . sertraline (ZOLOFT) 100 MG tablet Take 100 mg by mouth daily.        . traZODone (DESYREL) 100 MG tablet TAKE ONE TABLET BY MOUTH AT BEDTIME  90 tablet  0  . warfarin (COUMADIN) 5 MG tablet TAKE ONE AND ONE-HALF TABLETS BY MOUTH ONCE DAILY ON MONDAYS AND THURSDAYS. TAKE ONE TABLET BY MOUTH  ONCE DAILY ON ALL OTHER DAYS.  135 tablet  0  . Tamsulosin HCl (FLOMAX) 0.4 MG CAPS Take 0.4 mg by mouth daily as needed. For kidney stones       No current facility-administered medications on file prior to visit.    Current Medications (verified) Current Outpatient Prescriptions  Medication Sig Dispense Refill  . amLODipine (NORVASC) 5 MG tablet Take 5 mg by mouth daily.      Marland Kitchen aspirin EC 81 MG EC tablet Take 1 tablet (81 mg total) by mouth daily.      . insulin glargine (LANTUS) 100 UNIT/ML injection Inject 50 Units into the skin daily after supper.       . levothyroxine (SYNTHROID, LEVOTHROID) 88 MCG tablet TAKE ONE TABLET BY MOUTH ONCE DAILY  90 tablet  1  .  metFORMIN (GLUCOPHAGE) 1000 MG tablet Take 1 tablet (1,000 mg total) by mouth 2 (two) times daily with a meal.      . methocarbamol (ROBAXIN) 500 MG tablet Take 500 mg by mouth every 6 (six) hours as needed for muscle spasms.       . Multiple Vitamins-Minerals (ADULT GUMMY PO) Take 2 tablets by mouth.      . nitroGLYCERIN (NITROSTAT) 0.4 MG SL tablet Place 1 tablet (0.4 mg total) under the tongue every 5 (five) minutes x 3 doses as needed for chest pain.  25 tablet  12  . Oxycodone HCl 10 MG TABS Take 10 mg by mouth every 6 (six) hours as needed.       . pramipexole (MIRAPEX) 0.25 MG tablet Take 2 tablets by mouth daily as needed (restless leg).       . pravastatin (PRAVACHOL) 40 MG tablet TAKE 1 TABLET DAILY IN EVENING  30 tablet  5  . sertraline (ZOLOFT) 100 MG tablet Take 100 mg by mouth daily.        . traZODone (DESYREL) 100 MG tablet TAKE ONE TABLET BY MOUTH AT BEDTIME  90 tablet  0  . warfarin (COUMADIN) 5 MG tablet TAKE ONE AND ONE-HALF TABLETS BY MOUTH ONCE DAILY ON MONDAYS AND THURSDAYS. TAKE ONE TABLET BY MOUTH ONCE DAILY ON ALL OTHER DAYS.  135 tablet  0  . Tamsulosin HCl (FLOMAX) 0.4 MG CAPS Take 0.4 mg by mouth daily as needed. For kidney stones       No current facility-administered medications for this visit.     Allergies (verified) Ace inhibitors; Benadryl; Diphenhydramine hcl; Imdur; Metoprolol; Morphine and related; Nsaids; and Valsartan   PAST HISTORY  Family History Family History  Problem Relation Age of Onset  . Heart disease Father     Deceased  . Alcohol abuse Father   . Other Mother     Deceased at young age  . Stroke Mother     after dilvery  . Heart attack Brother   . Healthy Son   . Healthy Daughter   . Other Brother     MVA    Social History History  Substance Use Topics  . Smoking status: Never Smoker   . Smokeless tobacco: Never Used  . Alcohol Use: No    Are there smokers in your home (other than you)?  No  Risk Factors Current  exercise habits: active - walks in woods and helping son with home repairs but no formal exercise  Dietary issues discussed: limiting calories   Cardiac risk factors: advanced age (older than 60 for men, 45 for women), diabetes mellitus, dyslipidemia, family history of premature  cardiovascular disease, hypertension, male gender, obesity (BMI >= 30 kg/m2) and sedentary lifestyle.  Depression Screen (Note: if answer to either of the following is "Yes", a more complete depression screening is indicated)   Q1: Over the past two weeks, have you felt down, depressed or hopeless? Yes  Q2: Over the past two weeks, have you felt little interest or pleasure in doing things? No  Have you lost interest or pleasure in daily life? No  Do you often feel hopeless? No  Do you cry easily over simple problems? No  Activities of Daily Living In your present state of health, do you have any difficulty performing the following activities?:  Driving? No Managing money?  No Feeding yourself? No Getting from bed to chair? No Climbing a flight of stairs? No Preparing food and eating?: No Bathing or showering? No Getting dressed: No Getting to the toilet? No Using the toilet:No Moving around from place to place: No In the past year have you fallen or had a near fall?:No   Are you sexually active?  Yes  Do you have more than one partner?  No  Hearing Difficulties: No Do you often ask people to speak up or repeat themselves? No Do you experience ringing or noises in your ears? No Do you have difficulty understanding soft or whispered voices? No   Do you feel that you have a problem with memory? No  Do you often misplace items? No  Do you feel safe at home?  Yes  Cognitive Testing  Alert? Yes  Normal Appearance?Yes  Oriented to person? Yes  Place? Yes   Time? Yes  Recall of three objects?  Yes  Can perform simple calculations? Yes  Displays appropriate judgment?Yes  Can read the correct time from a  watch face?Yes   Advanced Directives have been discussed with the patient? Yes   List the Names of Other Physician/Practitioners you currently use: 1.  Cardiology - Dr Sherryl Manges 2.  Neurology - Dr Nita Sickle 3.  Pain - Dr Gerilyn Pilgrim  Indicate any recent Medical Services you may have received from other than [facility] providers in the past year (date may be approximate).  Immunization History  Administered Date(s) Administered  . Influenza,inj,Quad PF,36+ Mos 05/31/2013, 06/05/2014    Screening Tests Health Maintenance  Topic Date Due  . Influenza Vaccine  04/04/2014  . Zostavax  09/14/2014 (Originally 07/24/2011)  . Hemoglobin A1c  10/03/2014  . Urine Microalbumin  11/20/2014  . Ophthalmology Exam  02/01/2015  . Foot Exam  04/03/2015  . Pneumococcal Polysaccharide Vaccine (##2) 11/19/2016  . Tetanus/tdap  09/16/2021    All answers were reviewed with the patient and necessary referrals were made:  Henrene Pastor, [facility]   06/05/2014   History reviewed: allergies, current medications, past family history, past medical history, past social history, past surgical history and problem list   Objective:    Blood pressure 122/78, pulse 70, height 5' 6.75" (1.695 m), weight 229 lb (103.874 kg). Body mass index is 36.15 kg/(m^2).  INR was 2.9 today in office   Assessment:     Initial Medicare Wellness Visit Therapeutic anticoagulation     Plan:     During the course of the visit the patient was educated and counseled about appropriate screening and preventive services including:    Pneumococcal vaccine - UTD  Influenza vaccine - given in office today  Td vaccine - UTD  Shingles Vaccine / Zostavax - check on cost $95 - patient declined due to  cost  Screening electrocardiogram - UTD  Prostate cancer screening - UTD  Colorectal cancer screening - patient to [facility].    [facility] - UTD [facility]  Orders Placed This  Encounter  Procedures  . Vitamin B12  . Vit D  25 hydroxy (rtn osteoporosis monitoring)  . Thyroid Panel With TSH  . Anemia Profile B  . Methylmalonic Acid, Serum    Anticoagulation Dose Instructions as of 06/05/2014     Glynis SmilesSun Mon Tue Wed Thu Fri Sat   New Dose 5 mg 5 mg 5 mg 5 mg 7.5 mg 5 mg 5 mg    Description       Continue current warfarin dose of 1 and 1/2 tablets on Thursdays, 1 tablet all other days.       RTC in 6 weeks to check INR Diet review for nutrition referral? Patient declined   Patient Instructions (the written plan) was given to the patient.  Medicare Attestation I have personally reviewed: The patient's medical and social history Their use of alcohol, tobacco or illicit drugs Their current medications and supplements The patient's functional ability including ADLs,fall risks, home safety risks, cognitive, and hearing and visual impairment Diet and physical activities Evidence for depression or mood disorders  The patient's weight, height, BMI, and HR/BP have been recorded in the chart.  I have made referrals, counseling, and provided education to the patient based on review of the above and I have provided the patient with a written personalized care plan for preventive services.     Henrene Pastorckard, Chanique Duca, [facility]   06/05/2014

## 2014-06-09 LAB — THYROID PANEL WITH TSH
FREE THYROXINE INDEX: 2.2 (ref 1.2–4.9)
T3 Uptake Ratio: 28 % (ref 24–39)
T4, Total: 7.8 ug/dL (ref 4.5–12.0)
TSH: 3.42 u[IU]/mL (ref 0.450–4.500)

## 2014-06-09 LAB — ANEMIA PROFILE B
BASOS: 1 %
Basophils Absolute: 0 10*3/uL (ref 0.0–0.2)
EOS ABS: 0.5 10*3/uL — AB (ref 0.0–0.4)
Eos: 6 %
Ferritin: 33 ng/mL (ref 30–400)
HEMATOCRIT: 41.6 % (ref 37.5–51.0)
HEMOGLOBIN: 13.9 g/dL (ref 12.6–17.7)
IRON: 65 ug/dL (ref 40–155)
Immature Grans (Abs): 0 10*3/uL (ref 0.0–0.1)
Immature Granulocytes: 0 %
Iron Saturation: 18 % (ref 15–55)
LYMPHS: 17 %
Lymphocytes Absolute: 1.4 10*3/uL (ref 0.7–3.1)
MCH: 29 pg (ref 26.6–33.0)
MCHC: 33.4 g/dL (ref 31.5–35.7)
MCV: 87 fL (ref 79–97)
Monocytes Absolute: 0.6 10*3/uL (ref 0.1–0.9)
Monocytes: 7 %
NEUTROS ABS: 5.9 10*3/uL (ref 1.4–7.0)
Neutrophils Relative %: 69 %
Platelets: 185 10*3/uL (ref 150–379)
RBC: 4.8 x10E6/uL (ref 4.14–5.80)
RDW: 15.3 % (ref 12.3–15.4)
RETIC CT PCT: 1.4 % (ref 0.6–2.6)
TIBC: 368 ug/dL (ref 250–450)
UIBC: 303 ug/dL (ref 150–375)
Vitamin B-12: 457 pg/mL (ref 211–946)
WBC: 8.5 10*3/uL (ref 3.4–10.8)

## 2014-06-09 LAB — METHYLMALONIC ACID, SERUM: METHYLMALONIC ACID: 219 nmol/L (ref 0–378)

## 2014-06-09 LAB — VITAMIN D 25 HYDROXY (VIT D DEFICIENCY, FRACTURES): VIT D 25 HYDROXY: 33.3 ng/mL (ref 30.0–100.0)

## 2014-06-10 ENCOUNTER — Encounter: Payer: Self-pay | Admitting: Pharmacist

## 2014-06-11 ENCOUNTER — Encounter: Payer: Self-pay | Admitting: Internal Medicine

## 2014-06-18 ENCOUNTER — Telehealth: Payer: Self-pay | Admitting: Pharmacist

## 2014-06-18 NOTE — Telephone Encounter (Signed)
Labs were WNL and released to patient via My Chart on 06/10/2014. Lab was also instructed to fax to Dr [place]ilyn [place].

## 2014-06-19 MED ORDER — METFORMIN HCL 1000 MG PO TABS: 1000.0000 mg | ORAL_TABLET | Freq: Two times a day (BID) | ORAL | Status: DC

## 2014-06-19 NOTE — Telephone Encounter (Signed)
[facility] because he will not received from [facility] / mail order until next week. Rx sent.

## 2014-07-02 ENCOUNTER — Other Ambulatory Visit: Payer: Self-pay | Admitting: Cardiovascular Disease

## 2014-07-06 ENCOUNTER — Telehealth: Payer: Self-pay | Admitting: Pharmacist

## 2014-07-06 NOTE — Telephone Encounter (Signed)
Patient called to up date medication list.  He is taking amlodipine 5mg  1 tablet bid - previous AVS stated qd.

## 2014-07-24 ENCOUNTER — Ambulatory Visit (INDEPENDENT_AMBULATORY_CARE_PROVIDER_SITE_OTHER): Payer: Medicare Other | Admitting: Pharmacist

## 2014-07-24 DIAGNOSIS — I4891 Unspecified atrial fibrillation: Secondary | ICD-10-CM

## 2014-07-24 DIAGNOSIS — I480 Paroxysmal atrial fibrillation: Secondary | ICD-10-CM

## 2014-07-24 LAB — POCT INR: INR: 2.8

## 2014-07-24 NOTE — Patient Instructions (Signed)
Anticoagulation Dose Instructions as of 07/24/2014      Glynis SmilesSun Mon Tue Wed Thu Fri Sat   New Dose 5 mg 5 mg 5 mg 5 mg 7.5 mg 5 mg 5 mg    Description        Continue current warfarin dose of 1 and 1/2 tablets on Thursdays, 1 tablet all other days.        INR was 2.8 today

## 2014-08-03 ENCOUNTER — Other Ambulatory Visit: Payer: Self-pay | Admitting: Cardiovascular Disease

## 2014-08-05 ENCOUNTER — Telehealth: Payer: Self-pay | Admitting: Pharmacist

## 2014-08-05 ENCOUNTER — Other Ambulatory Visit: Payer: Self-pay | Admitting: Cardiovascular Disease

## 2014-08-05 MED ORDER — PRAVASTATIN SODIUM 40 MG PO TABS: 40.0000 mg | ORAL_TABLET | Freq: Every evening | ORAL | Status: DC

## 2014-08-05 MED ORDER — AMLODIPINE BESYLATE 5 MG PO TABS
5.0000 mg | ORAL_TABLET | Freq: Two times a day (BID) | ORAL | Status: DC
Start: 2014-08-05 — End: 2014-09-02

## 2014-08-05 NOTE — Telephone Encounter (Signed)
Patient in need of pravastatin and amlodipine Rx.   He has follow up with cardio 12/7 and 12/17. appt in our office 09/2014

## 2014-08-10 ENCOUNTER — Encounter: Payer: Self-pay | Admitting: Internal Medicine

## 2014-08-10 ENCOUNTER — Ambulatory Visit (INDEPENDENT_AMBULATORY_CARE_PROVIDER_SITE_OTHER): Payer: Medicare Other | Admitting: *Deleted

## 2014-08-10 DIAGNOSIS — I480 Paroxysmal atrial fibrillation: Secondary | ICD-10-CM

## 2014-08-10 DIAGNOSIS — I442 Atrioventricular block, complete: Secondary | ICD-10-CM

## 2014-08-10 NOTE — Progress Notes (Signed)
Remote pacemaker transmission.   

## 2014-08-13 ENCOUNTER — Encounter (HOSPITAL_COMMUNITY): Payer: Self-pay | Admitting: Cardiology

## 2014-08-15 LAB — MDC_IDC_ENUM_SESS_TYPE_REMOTE
Battery Remaining Longevity: 101 mo
Battery Remaining Percentage: 95.5 %
Battery Voltage: 3.01 V
Brady Statistic AS VP Percent: 89 %
Brady Statistic AS VS Percent: 1 %
Brady Statistic RA Percent Paced: 11 %
Implantable Pulse Generator Model: 2240
Implantable Pulse Generator Serial Number: 7586540
Lead Channel Impedance Value: 550 Ohm
Lead Channel Sensing Intrinsic Amplitude: 12 mV
Lead Channel Setting Pacing Amplitude: 2 V
Lead Channel Setting Pacing Amplitude: 2.5 V
Lead Channel Setting Pacing Pulse Width: 0.5 ms
MDC IDC MSMT LEADCHNL RA IMPEDANCE VALUE: 350 Ohm
MDC IDC MSMT LEADCHNL RA SENSING INTR AMPL: 4.5 mV
MDC IDC SESS DTM: 20151207083936
MDC IDC SET LEADCHNL RV SENSING SENSITIVITY: 4 mV
MDC IDC STAT BRADY AP VP PERCENT: 11 %
MDC IDC STAT BRADY AP VS PERCENT: 1 %
MDC IDC STAT BRADY RV PERCENT PACED: 99 %

## 2014-08-17 ENCOUNTER — Other Ambulatory Visit: Payer: Self-pay | Admitting: Nurse Practitioner

## 2014-08-20 ENCOUNTER — Encounter: Payer: Self-pay | Admitting: Internal Medicine

## 2014-08-20 ENCOUNTER — Ambulatory Visit (INDEPENDENT_AMBULATORY_CARE_PROVIDER_SITE_OTHER): Payer: Medicare Other | Admitting: *Deleted

## 2014-08-20 ENCOUNTER — Encounter: Payer: Medicare Other | Admitting: Internal Medicine

## 2014-08-20 DIAGNOSIS — I442 Atrioventricular block, complete: Secondary | ICD-10-CM

## 2014-08-20 LAB — MDC_IDC_ENUM_SESS_TYPE_INCLINIC
Battery Remaining Longevity: 102 mo
Battery Voltage: 3.01 V
Brady Statistic RV Percent Paced: 99.75 %
Date Time Interrogation Session: 20151217150719
Implantable Pulse Generator Model: 2240
Lead Channel Impedance Value: 550 Ohm
Lead Channel Pacing Threshold Amplitude: 0.75 V
Lead Channel Pacing Threshold Amplitude: 0.75 V
Lead Channel Pacing Threshold Pulse Width: 0.5 ms
Lead Channel Pacing Threshold Pulse Width: 0.5 ms
Lead Channel Pacing Threshold Pulse Width: 0.5 ms
Lead Channel Pacing Threshold Pulse Width: 0.5 ms
Lead Channel Sensing Intrinsic Amplitude: 1.5 mV
Lead Channel Sensing Intrinsic Amplitude: 9 mV
Lead Channel Setting Pacing Amplitude: 2.5 V
Lead Channel Setting Pacing Pulse Width: 0.5 ms
MDC IDC MSMT LEADCHNL RA IMPEDANCE VALUE: 375 Ohm
MDC IDC MSMT LEADCHNL RV PACING THRESHOLD AMPLITUDE: 0.75 V
MDC IDC MSMT LEADCHNL RV PACING THRESHOLD AMPLITUDE: 0.75 V
MDC IDC PG SERIAL: 7586540
MDC IDC SET LEADCHNL RA PACING AMPLITUDE: 2 V
MDC IDC SET LEADCHNL RV SENSING SENSITIVITY: 4 mV
MDC IDC STAT BRADY RA PERCENT PACED: 13 %

## 2014-08-20 NOTE — Progress Notes (Signed)
Pacemaker check in clinic for low chronic impedance and noise on the atrial lead.  Mode switch episodes appear to be noise.  Sensing and pacing thresholds stable both bipolar and unipolar.  Atrial sensitivity reprogrammed 0.5-1.265mV per Dr. Graciela HusbandsKlein.

## 2014-08-24 ENCOUNTER — Telehealth: Payer: Self-pay | Admitting: Pharmacist

## 2014-08-24 NOTE — Telephone Encounter (Signed)
Patient is schedule to see oral surgeon today at 3pm.  Loevnox bridging is not recommended at this patient since it is less than 12 hours prior to possible surgery.  Patient will call regarding what oral surgeon suggests (surgery today or at a later day) for further instructions.

## 2014-08-26 NOTE — Telephone Encounter (Signed)
Patient is consulting with oral surgeon today. No surgery has been scheduled yet.  He has restarted warfarin.  Instructed him to let us know when oral surgery is schedule and we will make bridging schedule for him.

## 2014-08-31 ENCOUNTER — Telehealth: Payer: Self-pay | Admitting: Internal Medicine

## 2014-08-31 NOTE — Telephone Encounter (Signed)
New problem   Pt need to have 5 teeth extracted and they have questions about Lovenox bridge for Warfarin.

## 2014-09-01 NOTE — Telephone Encounter (Signed)
Called patient and spoke with wife about his 5 teeth extraction that needs to be scheduled.  I told wife there are two options:  1. Schedule 2 teeth to be pulled at one appointment and 3 teeth at the other appointment keeping patient on warfarin at the lower end of his INR goal and using topical agents to control bleeding. 2.  Have all 5 teeth pulled at one time, stopping warfarin and bridging patient with lovenox.  Patient does not have a fib so the Chads2 score is irrelevant.  He does have htn and diabetes.  Patient has a history of blood loss anemia in 2013.  I would feel most comfortable having the teeth pulled at different time and keeping patient on warfarin.  Patient will call me back with his decision.

## 2014-09-02 ENCOUNTER — Other Ambulatory Visit: Payer: Self-pay | Admitting: Nurse Practitioner

## 2014-09-02 MED ORDER — AMLODIPINE BESYLATE 5 MG PO TABS: 5.0000 mg | ORAL_TABLET | Freq: Two times a day (BID) | ORAL | Status: DC

## 2014-09-02 NOTE — Telephone Encounter (Signed)
done

## 2014-09-07 ENCOUNTER — Telehealth: Payer: Self-pay | Admitting: Family Medicine

## 2014-09-07 NOTE — Telephone Encounter (Signed)
Can you please help with this one? I'm not sure how to advise, thank you.

## 2014-09-08 NOTE — Telephone Encounter (Signed)
Called patient and he decided to have all 5 teeth pulled at once since they will be sedating him for the procedure.  He does not have an appointment yet but will call when it is scheduled so that we can make out a calendar for when to stop the warfarin, start lovenox, etc.  I explained the general plan with lovenox and warfarin to patient on the phone today.

## 2014-09-10 ENCOUNTER — Encounter: Payer: Self-pay | Admitting: Cardiology

## 2014-09-14 ENCOUNTER — Telehealth: Payer: Self-pay | Admitting: Nurse Practitioner

## 2014-09-14 NOTE — Telephone Encounter (Signed)
Be will need to hold warfarin prior to surgery and bridge with lovenox.  Need appt date to be able to make bridging plan.   Spoke with Margo CommonKatsue - she states they are in process of making appt and will let us know when scheduled.

## 2014-09-16 NOTE — Telephone Encounter (Signed)
Called patient to see if Dr Monia Pouchiggs has scheduled oral suregery yet.  Patient states that Dr Monia PouchIggs office has not scheduled yet.  He also states that he has the flu so will not try to schedule until he feels better.   He will notify us so that we can make lovenox bridging schedule when date of surgery known.

## 2014-09-18 ENCOUNTER — Other Ambulatory Visit: Payer: Self-pay | Admitting: Nurse Practitioner

## 2014-09-28 DIAGNOSIS — Z954 Presence of other heart-valve replacement: Secondary | ICD-10-CM | POA: Diagnosis not present

## 2014-09-28 DIAGNOSIS — E119 Type 2 diabetes mellitus without complications: Secondary | ICD-10-CM | POA: Insufficient documentation

## 2014-09-28 DIAGNOSIS — Z79899 Other long term (current) drug therapy: Secondary | ICD-10-CM | POA: Insufficient documentation

## 2014-09-28 DIAGNOSIS — F419 Anxiety disorder, unspecified: Secondary | ICD-10-CM | POA: Insufficient documentation

## 2014-09-28 DIAGNOSIS — E669 Obesity, unspecified: Secondary | ICD-10-CM | POA: Diagnosis not present

## 2014-09-28 DIAGNOSIS — N189 Chronic kidney disease, unspecified: Secondary | ICD-10-CM | POA: Diagnosis not present

## 2014-09-28 DIAGNOSIS — G2581 Restless legs syndrome: Secondary | ICD-10-CM | POA: Insufficient documentation

## 2014-09-28 DIAGNOSIS — Z950 Presence of cardiac pacemaker: Secondary | ICD-10-CM | POA: Diagnosis not present

## 2014-09-28 DIAGNOSIS — Z7982 Long term (current) use of aspirin: Secondary | ICD-10-CM | POA: Insufficient documentation

## 2014-09-28 DIAGNOSIS — H548 Legal blindness, as defined in USA: Secondary | ICD-10-CM | POA: Diagnosis not present

## 2014-09-28 DIAGNOSIS — Z9889 Other specified postprocedural states: Secondary | ICD-10-CM | POA: Diagnosis not present

## 2014-09-28 DIAGNOSIS — I2510 Atherosclerotic heart disease of native coronary artery without angina pectoris: Secondary | ICD-10-CM | POA: Diagnosis not present

## 2014-09-28 DIAGNOSIS — R300 Dysuria: Secondary | ICD-10-CM | POA: Insufficient documentation

## 2014-09-28 DIAGNOSIS — Z7901 Long term (current) use of anticoagulants: Secondary | ICD-10-CM | POA: Insufficient documentation

## 2014-09-28 DIAGNOSIS — F329 Major depressive disorder, single episode, unspecified: Secondary | ICD-10-CM | POA: Insufficient documentation

## 2014-09-28 DIAGNOSIS — Z794 Long term (current) use of insulin: Secondary | ICD-10-CM | POA: Insufficient documentation

## 2014-09-28 DIAGNOSIS — E785 Hyperlipidemia, unspecified: Secondary | ICD-10-CM | POA: Diagnosis not present

## 2014-09-28 DIAGNOSIS — I129 Hypertensive chronic kidney disease with stage 1 through stage 4 chronic kidney disease, or unspecified chronic kidney disease: Secondary | ICD-10-CM | POA: Insufficient documentation

## 2014-09-28 DIAGNOSIS — E039 Hypothyroidism, unspecified: Secondary | ICD-10-CM | POA: Diagnosis not present

## 2014-09-28 LAB — COMPREHENSIVE METABOLIC PANEL
ALBUMIN: 4 g/dL (ref 3.5–5.2)
ALT: 18 U/L (ref 0–53)
ANION GAP: 8 (ref 5–15)
AST: 28 U/L (ref 0–37)
Alkaline Phosphatase: 75 U/L (ref 39–117)
BILIRUBIN TOTAL: 0.9 mg/dL (ref 0.3–1.2)
BUN: 13 mg/dL (ref 6–23)
CALCIUM: 9.5 mg/dL (ref 8.4–10.5)
CO2: 27 mmol/L (ref 19–32)
CREATININE: 1.14 mg/dL (ref 0.50–1.35)
Chloride: 99 mmol/L (ref 96–112)
GFR, EST AFRICAN AMERICAN: 77 mL/min — AB (ref >90)
GFR, EST NON AFRICAN AMERICAN: 67 mL/min — AB (ref >90)
GLUCOSE: 151 mg/dL — AB (ref 70–99)
POTASSIUM: 4.1 mmol/L (ref 3.5–5.1)
Sodium: 134 mmol/L — ABNORMAL LOW (ref 135–145)
TOTAL PROTEIN: 7.8 g/dL (ref 6.0–8.3)

## 2014-09-28 LAB — URINALYSIS, ROUTINE W REFLEX MICROSCOPIC
Bilirubin Urine: NEGATIVE
Glucose, UA: NEGATIVE mg/dL
KETONES UR: 15 mg/dL — AB
Nitrite: NEGATIVE
PH: 5 (ref 5.0–8.0)
Protein, ur: 100 mg/dL — AB
Specific Gravity, Urine: >1.03 — ABNORMAL HIGH (ref 1.005–1.030)
Urobilinogen, UA: 0.2 mg/dL (ref 0.0–1.0)

## 2014-09-28 LAB — CBC
HCT: 39.4 % (ref 39.0–52.0)
HEMOGLOBIN: 13.1 g/dL (ref 13.0–17.0)
MCH: 28.9 pg (ref 26.0–34.0)
MCHC: 33.2 g/dL (ref 30.0–36.0)
MCV: 87 fL (ref 78.0–100.0)
PLATELETS: 210 10*3/uL (ref 150–400)
RBC: 4.53 MIL/uL (ref 4.22–5.81)
RDW: 15.7 % — AB (ref 11.5–15.5)
WBC: 19.7 10*3/uL — ABNORMAL HIGH (ref 4.0–10.5)

## 2014-09-28 LAB — URINE MICROSCOPIC-ADD ON

## 2014-09-28 LAB — CBG MONITORING, ED: Glucose-Capillary: 148 mg/dL — ABNORMAL HIGH (ref 70–99)

## 2014-09-28 MED ORDER — OXYCODONE-ACETAMINOPHEN 5-325 MG PO TABS
1.0000 | ORAL_TABLET | Freq: Once | ORAL | Status: AC
  Administered 2014-09-28: 1 via ORAL
  Filled 2014-09-28: qty 1

## 2014-09-28 NOTE — ED Notes (Signed)
Patient here with complaint of urinary urgency coupled with dysuria. States that it came on today. States that he believes he passed a kidney stone today around 1600. Reports this pain doesn't feel anything like previous kidney stones. Patient is diabetic, has been receiving all scheduled doses of insulin and metformin, CBG @ 189.

## 2014-09-28 NOTE — ED Notes (Signed)
Patient attempted to provide urine sample, but unable at this time.

## 2014-09-29 ENCOUNTER — Emergency Department (HOSPITAL_COMMUNITY): Payer: PPO

## 2014-09-29 ENCOUNTER — Encounter (HOSPITAL_COMMUNITY): Payer: Self-pay | Admitting: Radiology

## 2014-09-29 ENCOUNTER — Emergency Department (HOSPITAL_COMMUNITY)
Admission: EM | Admit: 2014-09-29 | Discharge: 2014-09-29 | Disposition: A | Discharge status: Home / Self Care | Payer: PPO | Attending: Emergency Medicine | Admitting: Emergency Medicine

## 2014-09-29 DIAGNOSIS — R300 Dysuria: Secondary | ICD-10-CM

## 2014-09-29 DIAGNOSIS — R52 Pain, unspecified: Secondary | ICD-10-CM

## 2014-09-29 LAB — CBC WITH DIFFERENTIAL/PLATELET
Basophils Absolute: 0.1 10*3/uL (ref 0.0–0.1)
Basophils Relative: 0 % (ref 0–1)
Eosinophils Absolute: 0.1 10*3/uL (ref 0.0–0.7)
Eosinophils Relative: 0 % (ref 0–5)
HCT: 38.8 % — ABNORMAL LOW (ref 39.0–52.0)
Hemoglobin: 13.1 g/dL (ref 13.0–17.0)
Lymphocytes Relative: 7 % — ABNORMAL LOW (ref 12–46)
Lymphs Abs: 1.4 10*3/uL (ref 0.7–4.0)
MCH: 29.7 pg (ref 26.0–34.0)
MCHC: 33.8 g/dL (ref 30.0–36.0)
MCV: 88 fL (ref 78.0–100.0)
MONO ABS: 2.2 10*3/uL — AB (ref 0.1–1.0)
Monocytes Relative: 10 % (ref 3–12)
Neutro Abs: 17.4 10*3/uL — ABNORMAL HIGH (ref 1.7–7.7)
Neutrophils Relative %: 83 % — ABNORMAL HIGH (ref 43–77)
PLATELETS: 190 10*3/uL (ref 150–400)
RBC: 4.41 MIL/uL (ref 4.22–5.81)
RDW: 15.8 % — ABNORMAL HIGH (ref 11.5–15.5)
WBC: 21.1 10*3/uL — ABNORMAL HIGH (ref 4.0–10.5)

## 2014-09-29 LAB — PROTIME-INR
INR: 1.78 — ABNORMAL HIGH (ref 0.00–1.49)
PROTHROMBIN TIME: 20.9 s — AB (ref 11.6–15.2)

## 2014-09-29 MED ORDER — CEPHALEXIN 500 MG PO CAPS: 500.0000 mg | ORAL_CAPSULE | Freq: Two times a day (BID) | ORAL | Status: DC

## 2014-09-29 MED ORDER — OXYCODONE-ACETAMINOPHEN 5-325 MG PO TABS: 1.0000 | ORAL_TABLET | Freq: Four times a day (QID) | ORAL | Status: DC | PRN

## 2014-09-29 MED ORDER — SODIUM CHLORIDE 0.9 % IV SOLN
INTRAVENOUS | Status: DC
  Administered 2014-09-29: 01:00:00 via INTRAVENOUS

## 2014-09-29 MED ORDER — HYOSCYAMINE SULFATE 0.125 MG PO TABS
0.2500 mg | ORAL_TABLET | Freq: Once | ORAL | Status: AC
  Administered 2014-09-29: 0.25 mg via ORAL
  Filled 2014-09-29: qty 2

## 2014-09-29 MED ORDER — FENTANYL CITRATE 0.05 MG/ML IJ SOLN
100.0000 ug | Freq: Once | INTRAMUSCULAR | Status: AC
  Administered 2014-09-29: 100 ug via INTRAVENOUS
  Filled 2014-09-29: qty 2

## 2014-09-29 MED ORDER — IOHEXOL 300 MG/ML  SOLN
25.0000 mL | INTRAMUSCULAR | Status: AC
  Administered 2014-09-29: 25 mL via ORAL

## 2014-09-29 MED ORDER — DEXTROSE 5 % IV SOLN
1.0000 g | Freq: Once | INTRAVENOUS | Status: AC
  Administered 2014-09-29: 1 g via INTRAVENOUS
  Filled 2014-09-29: qty 10

## 2014-09-29 MED ORDER — IOHEXOL 300 MG/ML  SOLN
100.0000 mL | Freq: Once | INTRAMUSCULAR | Status: AC | PRN
  Administered 2014-09-29: 100 mL via INTRAVENOUS
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
## 2014-09-29 NOTE — ED Notes (Signed)
CT informed pt drank contrast  

## 2014-10-01 LAB — URINE CULTURE: Colony Count: 10000

## 2014-10-02 ENCOUNTER — Telehealth (HOSPITAL_BASED_OUTPATIENT_CLINIC_OR_DEPARTMENT_OTHER): Payer: Self-pay | Admitting: Emergency Medicine

## 2014-10-02 NOTE — Telephone Encounter (Signed)
Post ED Visit - Positive Culture Follow-up  Culture report reviewed by antimicrobial stewardship pharmacist: []  Wes Dulaney, Pharm.D., BCPS [x]  Celedonio MiyamotoJeremy Frens, Pharm.D., BCPS []  Georgina PillionElizabeth Martin, Pharm.D., BCPS []  Medicine BowMinh Pham, [address]Ph[address]D., BCPS, AAHIVP []  Estella HuskMichelle Turner, Pharm.D., BCPS, AAHIVP []  Elder CyphersLorie Poole, [address]Pharm[address]D., BCPS  Positive urine culture Klebsiella Treated with Cephalexin, organism sensitive to the same and no further patient follow-up is required at this time.  Berle MullMiller, Leone Mobley 10/02/2014, 11:35 AM

## 2014-10-08 ENCOUNTER — Telehealth: Payer: Self-pay | Admitting: Pharmacist

## 2014-10-08 NOTE — Telephone Encounter (Signed)
Called patient today to set up appt to have INR rechecked - he has not been into our office for INR since November.  He did present to ER at end of January with UTI / r/o kidney stone.  INR was checked then and was found to be subtherapeutic.  He has been on ABX for last week.   When I spoke to patient today he was going to Dr Charlott Hollerahlstadts office and [facility]  I instructed patient to let me know if INR checked at hospital.  If it is not then we need to get patient in office for check ASAP.

## 2014-10-22 ENCOUNTER — Telehealth: Payer: Self-pay | Admitting: Pharmacist

## 2014-10-22 NOTE — Telephone Encounter (Signed)
appt made fro 10/23/2014 at 12:00 noon Patient notified.

## 2014-10-23 ENCOUNTER — Ambulatory Visit (INDEPENDENT_AMBULATORY_CARE_PROVIDER_SITE_OTHER): Payer: PPO | Admitting: Pharmacist

## 2014-10-23 DIAGNOSIS — I4891 Unspecified atrial fibrillation: Secondary | ICD-10-CM

## 2014-10-23 DIAGNOSIS — Z0289 Encounter for other administrative examinations: Secondary | ICD-10-CM

## 2014-10-23 DIAGNOSIS — I480 Paroxysmal atrial fibrillation: Secondary | ICD-10-CM

## 2014-10-23 LAB — POCT INR: INR: 3.2

## 2014-10-23 NOTE — Patient Instructions (Signed)
Anticoagulation Dose Instructions as of 10/23/2014      Andre SmilesSun Mon Tue Wed Thu Fri Sat   New Dose 5 mg 5 mg 5 mg 5 mg 7.5 mg 5 mg 5 mg    Description        No warfarin today - Friday, February 19th. Then continue current warfarin dose of 1 and 1/2 tablets on Thursdays, 1 tablet all other days.        INR was 3.2 today

## 2014-10-29 ENCOUNTER — Other Ambulatory Visit: Payer: Self-pay | Admitting: Pharmacist

## 2014-11-02 ENCOUNTER — Telehealth: Payer: Self-pay | Admitting: Nurse Practitioner

## 2014-11-02 MED ORDER — ENOXAPARIN SODIUM 100 MG/ML ~~LOC~~ SOLN: 100.0000 mg | Freq: Two times a day (BID) | SUBCUTANEOUS | Status: DC

## 2014-11-02 NOTE — Telephone Encounter (Signed)
Date has not been set for dental procedure yet so I cannot give him specific direction on when to stop.  Patient want to know how much enoxaparin would cost if he did bridging while off warfarin.  Unfortunately I do not know what the cost would be but I will call pharmacy to find out cost.  Spoke with [facility] and cost is zero copay. Patient aware.  He will let me know when dental procedure scheduled to get more explicit directions.

## 2014-11-05 ENCOUNTER — Telehealth: Payer: Self-pay | Admitting: Nurse Practitioner

## 2014-11-05 MED ORDER — CLINDAMYCIN HCL 300 MG PO CAPS: 300.0000 mg | ORAL_CAPSULE | Freq: Four times a day (QID) | ORAL | Status: DC

## 2014-11-05 NOTE — Telephone Encounter (Signed)
Patient's wife aware rx sent to pharmacy  

## 2014-11-05 NOTE — Telephone Encounter (Signed)
Cleocin rx sent to pharmacy

## 2014-11-05 NOTE — Telephone Encounter (Signed)
PCP to address.

## 2014-11-09 ENCOUNTER — Encounter: Payer: Self-pay | Admitting: Internal Medicine

## 2014-11-09 ENCOUNTER — Ambulatory Visit (INDEPENDENT_AMBULATORY_CARE_PROVIDER_SITE_OTHER): Payer: Medicare (Managed Care) | Admitting: *Deleted

## 2014-11-09 DIAGNOSIS — I442 Atrioventricular block, complete: Secondary | ICD-10-CM

## 2014-11-09 NOTE — Progress Notes (Signed)
Remote pacemaker transmission.   

## 2014-11-11 LAB — MDC_IDC_ENUM_SESS_TYPE_REMOTE
Battery Voltage: 3.01 V
Brady Statistic AP VS Percent: 1 %
Brady Statistic AS VP Percent: 88 %
Brady Statistic AS VS Percent: 1 %
Brady Statistic RA Percent Paced: 11 %
Date Time Interrogation Session: 20160307070027
Implantable Pulse Generator Model: 2240
Implantable Pulse Generator Serial Number: 7586540
Lead Channel Impedance Value: 350 Ohm
Lead Channel Pacing Threshold Amplitude: 0.75 V
Lead Channel Pacing Threshold Amplitude: 0.75 V
Lead Channel Pacing Threshold Pulse Width: 0.5 ms
Lead Channel Sensing Intrinsic Amplitude: 11.1 mV
Lead Channel Sensing Intrinsic Amplitude: 4.7 mV
Lead Channel Setting Pacing Pulse Width: 0.5 ms
Lead Channel Setting Sensing Sensitivity: 4 mV
MDC IDC MSMT BATTERY REMAINING LONGEVITY: 107 mo
MDC IDC MSMT BATTERY REMAINING PERCENTAGE: 95.5 %
MDC IDC MSMT LEADCHNL RV IMPEDANCE VALUE: 550 Ohm
MDC IDC MSMT LEADCHNL RV PACING THRESHOLD PULSEWIDTH: 0.5 ms
MDC IDC SET LEADCHNL RA PACING AMPLITUDE: 2 V
MDC IDC SET LEADCHNL RV PACING AMPLITUDE: 2.5 V
MDC IDC STAT BRADY AP VP PERCENT: 12 %
MDC IDC STAT BRADY RV PERCENT PACED: 99 %

## 2014-11-16 ENCOUNTER — Encounter: Payer: Self-pay | Admitting: Cardiology

## 2014-12-04 ENCOUNTER — Telehealth: Payer: Self-pay | Admitting: *Deleted

## 2014-12-04 NOTE — Telephone Encounter (Signed)
Received request for surgical clearance for prostate biopsy and medication management from [facility].  Pt will need office visit.  I spoke with pt and appt made for him to see Tereso NewcomerScott Weaver, PA on December 08, 2014 at 11:00

## 2014-12-07 ENCOUNTER — Telehealth: Payer: Self-pay | Admitting: Pharmacist

## 2014-12-07 NOTE — Telephone Encounter (Signed)
Patient called to remind that protime is past due.  He states that he is seeing his cardiologist this week for procedure and will have INR checked then.  Will check for result and set up appt as needed.

## 2014-12-08 ENCOUNTER — Ambulatory Visit (INDEPENDENT_AMBULATORY_CARE_PROVIDER_SITE_OTHER): Payer: PPO | Admitting: Physician Assistant

## 2014-12-08 ENCOUNTER — Encounter: Payer: Self-pay | Admitting: Physician Assistant

## 2014-12-08 VITALS — BP 124/70 | HR 61 | Ht 66.0 in | Wt 222.0 lb

## 2014-12-08 DIAGNOSIS — I350 Nonrheumatic aortic (valve) stenosis: Secondary | ICD-10-CM | POA: Diagnosis not present

## 2014-12-08 DIAGNOSIS — Z01810 Encounter for preprocedural cardiovascular examination: Secondary | ICD-10-CM | POA: Diagnosis not present

## 2014-12-08 DIAGNOSIS — I10 Essential (primary) hypertension: Secondary | ICD-10-CM

## 2014-12-08 DIAGNOSIS — Z950 Presence of cardiac pacemaker: Secondary | ICD-10-CM

## 2014-12-08 DIAGNOSIS — E785 Hyperlipidemia, unspecified: Secondary | ICD-10-CM

## 2014-12-08 DIAGNOSIS — Z952 Presence of prosthetic heart valve: Secondary | ICD-10-CM

## 2014-12-08 DIAGNOSIS — Z954 Presence of other heart-valve replacement: Secondary | ICD-10-CM | POA: Diagnosis not present

## 2014-12-08 DIAGNOSIS — I442 Atrioventricular block, complete: Secondary | ICD-10-CM

## 2014-12-08 DIAGNOSIS — I2510 Atherosclerotic heart disease of native coronary artery without angina pectoris: Secondary | ICD-10-CM

## 2014-12-08 NOTE — Patient Instructions (Signed)
Your physician wants you to follow-up in: 6 MONTHS WITH DR. Clifton JamesMCALHANY You will receive a reminder letter in the mail two months in advance. If you don't receive a letter, please call our office to schedule the follow-up appointment.   CALL YOUR PRIMARY CARE OFFICE TO ARRANGE FOR LOVENOX BRIDGING  Your physician has requested that you have an echocardiogram. Echocardiography is a painless test that uses sound waves to create images of your heart. It provides your doctor with information about the size and shape of your heart and how well your heart's chambers and valves are working. This procedure takes approximately one hour. There are no restrictions for this procedure.
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
## 2014-12-09 ENCOUNTER — Telehealth: Payer: Self-pay

## 2014-12-09 NOTE — Telephone Encounter (Signed)
Patient having a prostate Bx Jan 15 2015 needs to be off Coumadin 5 days prior  Being done in office local anesthesia  Been given Lovenox through our office previously

## 2014-12-10 ENCOUNTER — Ambulatory Visit (INDEPENDENT_AMBULATORY_CARE_PROVIDER_SITE_OTHER): Payer: PPO | Admitting: Pharmacist

## 2014-12-10 DIAGNOSIS — I480 Paroxysmal atrial fibrillation: Secondary | ICD-10-CM

## 2014-12-10 DIAGNOSIS — I4891 Unspecified atrial fibrillation: Secondary | ICD-10-CM

## 2014-12-10 LAB — POCT INR: INR: 3.3

## 2014-12-10 MED ORDER — ENOXAPARIN SODIUM 100 MG/ML ~~LOC~~ SOLN: 100.0000 mg | Freq: Two times a day (BID) | SUBCUTANEOUS | Status: DC

## 2014-12-10 NOTE — Patient Instructions (Signed)
Anticoagulation Dose Instructions as of 12/10/2014      Glynis SmilesSun Mon Tue Wed Thu Fri Sat   New Dose 5 mg 5 mg 5 mg 5 mg 5 mg 5 mg 5 mg    Description        Hold today's dose - Thursday, April 7th, then decrease dos to  warfarin 5mg  1 tablet daily       INR was 3.3 today

## 2014-12-10 NOTE — Telephone Encounter (Signed)
Faxed copy of bridging plan to Dr Chauncey Readingahlstadt's office attn Victorino DikeJennifer to fax [phone] and also reviewed with patient in office today.

## 2014-12-10 NOTE — Telephone Encounter (Signed)
x

## 2014-12-12 ENCOUNTER — Other Ambulatory Visit: Payer: Self-pay | Admitting: Nurse Practitioner

## 2014-12-22 ENCOUNTER — Other Ambulatory Visit: Payer: Self-pay | Admitting: Nurse Practitioner

## 2014-12-23 ENCOUNTER — Ambulatory Visit (HOSPITAL_COMMUNITY): Payer: PPO | Attending: Cardiovascular Disease | Admitting: Cardiology

## 2014-12-23 DIAGNOSIS — I350 Nonrheumatic aortic (valve) stenosis: Secondary | ICD-10-CM

## 2014-12-23 NOTE — Progress Notes (Signed)
Echo performed. 

## 2014-12-23 NOTE — Telephone Encounter (Signed)
Last seen 04/02/14 MMM

## 2014-12-25 ENCOUNTER — Encounter: Payer: Self-pay | Admitting: Physician Assistant

## 2014-12-25 ENCOUNTER — Telehealth: Payer: Self-pay | Admitting: Physician Assistant

## 2014-12-25 DIAGNOSIS — I350 Nonrheumatic aortic (valve) stenosis: Secondary | ICD-10-CM

## 2014-12-25 NOTE — Telephone Encounter (Signed)
Echo 4/16:  Mild LVH, EF 50-55%, no RWMA, Gr 2 DD, mechanical AVR with mod AS (peak 67, mean 33) - worse compared to 2014 (? Related to anemia), mild MR, mod to severe LAE, reduced RVF, mild to mod TR, PASP 37 mmHg  Thayer Ohmhris, Can you take a look at his echo?  Gradients are higher and measurements suggest it could be related to anemia. I will have him get a CBC.  Should we repeat it again in 6 mos? Angelicia Lessner

## 2014-12-30 ENCOUNTER — Other Ambulatory Visit: Payer: Self-pay | Admitting: Cardiovascular Disease

## 2014-12-30 NOTE — Telephone Encounter (Signed)
Reviewed with Dr. Verne Carrowhristopher McAlhany. AVR gradients higher than expected. LV function normal. Please have patient get a CBC. Repeat Echo in 6 mos. Tereso NewcomerScott Anmol Paschen, PA-C   12/30/2014 1:53 PM

## 2014-12-30 NOTE — Telephone Encounter (Signed)
Pt notified. He will come to office for CBC on 01/01/15.  He is due for follow up with Dr. Clifton JamesMcAlhany in 6 months and will put in recalls for echo week prior to this appt.

## 2015-01-01 ENCOUNTER — Other Ambulatory Visit: Payer: PPO

## 2015-01-06 ENCOUNTER — Encounter: Payer: Self-pay | Admitting: Internal Medicine

## 2015-01-18 ENCOUNTER — Ambulatory Visit (INDEPENDENT_AMBULATORY_CARE_PROVIDER_SITE_OTHER): Payer: PPO | Admitting: Pharmacist

## 2015-01-18 VITALS — Wt 218.0 lb

## 2015-01-18 DIAGNOSIS — I480 Paroxysmal atrial fibrillation: Secondary | ICD-10-CM

## 2015-01-18 LAB — POCT INR: INR: 1.6

## 2015-01-18 NOTE — Patient Instructions (Signed)
Anticoagulation Dose Instructions as of 01/18/2015      Glynis SmilesSun Mon Tue Wed Thu Fri Sat   New Dose 5 mg 5 mg 5 mg 5 mg 5 mg 5 mg 5 mg    Description        Take 1 and 1/2 tablets today - Monday, May 16th and tomorrow Tuesday, May 17th.  Then restart usually warfarin dose of 5mg  1 tablet daily.      INR was 1.6 today

## 2015-01-22 ENCOUNTER — Other Ambulatory Visit: Payer: Self-pay | Admitting: Nurse Practitioner

## 2015-01-22 NOTE — Telephone Encounter (Signed)
Patient NTBS for follow up and lab work no more refills without being seen  

## 2015-02-04 ENCOUNTER — Telehealth: Payer: Self-pay | Admitting: Pharmacist

## 2015-02-04 MED ORDER — METFORMIN HCL 1000 MG PO TABS: 1000.0000 mg | ORAL_TABLET | Freq: Two times a day (BID) | ORAL | Status: DC

## 2015-02-04 NOTE — Telephone Encounter (Signed)
Patient usually gets metformin through [facility] but cannot get until next Thursday when he goes for a check up.  Asked that he bring copy of labs - he is due A1c and BMET.  Rx sent in for #60 metformin 1000mg  bid 0RF.

## 2015-02-08 ENCOUNTER — Encounter: Payer: Self-pay | Admitting: Internal Medicine

## 2015-02-08 ENCOUNTER — Ambulatory Visit (INDEPENDENT_AMBULATORY_CARE_PROVIDER_SITE_OTHER): Payer: PPO | Admitting: *Deleted

## 2015-02-08 DIAGNOSIS — I442 Atrioventricular block, complete: Secondary | ICD-10-CM

## 2015-02-09 NOTE — Progress Notes (Signed)
Remote pacemaker transmission.   

## 2015-02-10 LAB — HEMOGLOBIN A1C: HEMOGLOBIN A1C: 5.9 % (ref 4.0–6.0)

## 2015-02-10 LAB — LIPID PANEL
Cholesterol: 201 mg/dL — AB (ref 0–200)
HDL: 40 mg/dL (ref 35–70)
LDL Cholesterol: 99 mg/dL
Triglycerides: 312 mg/dL — AB (ref 40–160)

## 2015-02-11 LAB — CUP PACEART REMOTE DEVICE CHECK
Battery Voltage: 3.01 V
Brady Statistic AP VP Percent: 14 %
Brady Statistic AP VS Percent: 1 %
Brady Statistic RA Percent Paced: 14 %
Brady Statistic RV Percent Paced: 99 %
Date Time Interrogation Session: 20160606072747
Lead Channel Sensing Intrinsic Amplitude: 2.6 mV
Lead Channel Sensing Intrinsic Amplitude: 9.9 mV
Lead Channel Setting Pacing Amplitude: 2 V
Lead Channel Setting Pacing Amplitude: 2.5 V
Lead Channel Setting Pacing Pulse Width: 0.5 ms
Lead Channel Setting Sensing Sensitivity: 4 mV
MDC IDC MSMT BATTERY REMAINING LONGEVITY: 106 mo
MDC IDC MSMT BATTERY REMAINING PERCENTAGE: 95.5 %
MDC IDC MSMT LEADCHNL RA IMPEDANCE VALUE: 310 Ohm
MDC IDC MSMT LEADCHNL RV IMPEDANCE VALUE: 550 Ohm
MDC IDC PG SERIAL: 7586540
MDC IDC STAT BRADY AS VP PERCENT: 85 %
MDC IDC STAT BRADY AS VS PERCENT: 1 %
Pulse Gen Model: 2240

## 2015-02-20 ENCOUNTER — Other Ambulatory Visit: Payer: Self-pay | Admitting: Nurse Practitioner

## 2015-02-22 ENCOUNTER — Telehealth: Payer: Self-pay | Admitting: Nurse Practitioner

## 2015-02-22 ENCOUNTER — Other Ambulatory Visit: Payer: Self-pay | Admitting: Cardiovascular Disease

## 2015-02-22 MED ORDER — AMLODIPINE BESYLATE 5 MG PO TABS: ORAL_TABLET | ORAL | Status: DC

## 2015-02-22 NOTE — Telephone Encounter (Signed)
Spoke with pt- he needs amlodipine refilled.  Advised it has been almost a year since he saw MMM.  Scheduled him for a f/u appointment 04/01/15 - refills given to last until that appointment.

## 2015-02-24 ENCOUNTER — Ambulatory Visit (INDEPENDENT_AMBULATORY_CARE_PROVIDER_SITE_OTHER): Payer: PPO | Admitting: Pharmacist

## 2015-02-24 DIAGNOSIS — I480 Paroxysmal atrial fibrillation: Secondary | ICD-10-CM

## 2015-02-24 LAB — POCT INR: INR: 2.2

## 2015-02-24 NOTE — Patient Instructions (Signed)
Anticoagulation Dose Instructions as of 02/24/2015      Andre Malone Tue Wed Thu Fri Sat   New Dose 5 mg 5 mg 5 mg 5 mg 5 mg 5 mg 5 mg    Description        Continue usual warfarin dose of 5mg  1 tablet daily.      INR was 2.2 today

## 2015-03-03 ENCOUNTER — Encounter: Payer: Self-pay | Admitting: Cardiology

## 2015-03-17 ENCOUNTER — Encounter: Payer: Self-pay | Admitting: Cardiology

## 2015-03-27 ENCOUNTER — Other Ambulatory Visit: Payer: Self-pay | Admitting: Pharmacist

## 2015-03-31 ENCOUNTER — Ambulatory Visit: Payer: Self-pay | Admitting: Nurse Practitioner

## 2015-04-01 ENCOUNTER — Ambulatory Visit: Payer: Self-pay | Admitting: Nurse Practitioner

## 2015-04-10 ENCOUNTER — Other Ambulatory Visit: Payer: Self-pay | Admitting: Nurse Practitioner

## 2015-04-19 ENCOUNTER — Other Ambulatory Visit: Payer: Self-pay | Admitting: Nurse Practitioner

## 2015-04-23 ENCOUNTER — Encounter: Payer: Self-pay | Admitting: Nurse Practitioner

## 2015-04-23 ENCOUNTER — Ambulatory Visit (INDEPENDENT_AMBULATORY_CARE_PROVIDER_SITE_OTHER): Payer: PPO | Admitting: Nurse Practitioner

## 2015-04-23 VITALS — BP 134/71 | HR 57 | Temp 97.0°F | Ht 66.0 in | Wt 213.0 lb

## 2015-04-23 DIAGNOSIS — I480 Paroxysmal atrial fibrillation: Secondary | ICD-10-CM | POA: Diagnosis not present

## 2015-04-23 DIAGNOSIS — N183 Chronic kidney disease, stage 3 unspecified: Secondary | ICD-10-CM

## 2015-04-23 DIAGNOSIS — E119 Type 2 diabetes mellitus without complications: Secondary | ICD-10-CM | POA: Diagnosis not present

## 2015-04-23 DIAGNOSIS — I10 Essential (primary) hypertension: Secondary | ICD-10-CM | POA: Diagnosis not present

## 2015-04-23 DIAGNOSIS — E039 Hypothyroidism, unspecified: Secondary | ICD-10-CM

## 2015-04-23 DIAGNOSIS — I25119 Atherosclerotic heart disease of native coronary artery with unspecified angina pectoris: Secondary | ICD-10-CM | POA: Diagnosis not present

## 2015-04-23 DIAGNOSIS — E785 Hyperlipidemia, unspecified: Secondary | ICD-10-CM

## 2015-04-23 DIAGNOSIS — I214 Non-ST elevation (NSTEMI) myocardial infarction: Secondary | ICD-10-CM

## 2015-04-23 DIAGNOSIS — I359 Nonrheumatic aortic valve disorder, unspecified: Secondary | ICD-10-CM | POA: Diagnosis not present

## 2015-04-23 DIAGNOSIS — Z950 Presence of cardiac pacemaker: Secondary | ICD-10-CM

## 2015-04-23 LAB — POCT INR: INR: 1.9

## 2015-04-23 MED ORDER — AMLODIPINE BESYLATE 5 MG PO TABS: ORAL_TABLET | ORAL | Status: DC

## 2015-04-23 MED ORDER — METHOCARBAMOL 500 MG PO TABS: 500.0000 mg | ORAL_TABLET | Freq: Four times a day (QID) | ORAL | Status: DC | PRN

## 2015-04-23 MED ORDER — PRAVASTATIN SODIUM 40 MG PO TABS: ORAL_TABLET | ORAL | Status: DC

## 2015-04-23 MED ORDER — WARFARIN SODIUM 5 MG PO TABS: ORAL_TABLET | ORAL | Status: DC

## 2015-04-23 MED ORDER — LEVOTHYROXINE SODIUM 88 MCG PO TABS: 88.0000 ug | ORAL_TABLET | Freq: Every day | ORAL | Status: DC

## 2015-04-23 NOTE — Patient Instructions (Signed)
Exercise to Stay Healthy Exercise helps you become and stay healthy. EXERCISE IDEAS AND TIPS Choose exercises that:  You enjoy.  Fit into your day. You do not need to exercise really hard to be healthy. You can do exercises at a slow or medium level and stay healthy. You can:  Stretch before and after working out.  Try yoga, Pilates, or tai chi.  Lift weights.  Walk fast, swim, jog, run, climb stairs, bicycle, dance, or rollerskate.  Take aerobic classes. Exercises that burn about 150 calories:  Running 1  miles in 15 minutes.  Playing volleyball for 45 to 60 minutes.  Washing and waxing a car for 45 to 60 minutes.  Playing touch football for 45 minutes.  Walking 1  miles in 35 minutes.  Pushing a stroller 1  miles in 30 minutes.  Playing basketball for 30 minutes.  Raking leaves for 30 minutes.  Bicycling 5 miles in 30 minutes.  Walking 2 miles in 30 minutes.  Dancing for 30 minutes.  Shoveling snow for 15 minutes.  Swimming laps for 20 minutes.  Walking up stairs for 15 minutes.  Bicycling 4 miles in 15 minutes.  Gardening for 30 to 45 minutes.  Jumping rope for 15 minutes.  Washing windows or floors for 45 to 60 minutes. Document Released: 09/23/2010 Document Revised: 11/13/2011 Document Reviewed: 09/23/2010 Exit[employer] Patient Information 2015 [employer]. This information is not intended to replace advice given to you by your health care provider. Make sure you discuss any questions you have with your health care provider.  

## 2015-04-23 NOTE — Progress Notes (Signed)
Subjective:    Patient ID: Andre Malone, male    DOB: [birth year], 64 y.o.   MRN: [id number]  Patient presents today for follow up of chronic medical problems -New pacemaker placed in 09/17/13 with new leads   Diabetes He presents for his follow-up diabetic visit. He has type 2 diabetes mellitus. His disease course has been stable. Pertinent negatives for hypoglycemia include no dizziness. Pertinent negatives for diabetes include no chest pain, no fatigue, no polydipsia, no polyphagia, no polyuria, no visual change and no weakness. Diabetic complications include heart disease and peripheral neuropathy. Pertinent negatives for diabetic complications include no CVA or retinopathy. Risk factors for coronary artery disease include diabetes mellitus, dyslipidemia, hypertension, male sex and obesity. Current diabetic treatment includes insulin injections and oral agent (monotherapy). He is compliant with treatment most of the time. His weight is stable. He has not had a previous visit with a dietitian. He rarely participates in exercise. His breakfast blood glucose is taken between 8-9 am. His breakfast blood glucose range is generally 110-130 mg/dl. His overall blood glucose range is 110-130 mg/dl. An ACE inhibitor/angiotensin II receptor blocker is not being taken. He does not see a podiatrist.Eye exam is current.  Hypertension This is a chronic problem. The current episode started more than 1 year ago. The problem is controlled. Pertinent negatives include no chest pain, palpitations or shortness of breath. Past treatments include calcium channel blockers. The current treatment provides moderate improvement. Compliance problems include diet and exercise.  Hypertensive end-organ damage includes CAD/MI, heart failure and a thyroid problem. There is no history of CVA or retinopathy.  Hyperlipidemia This is a chronic problem. The current episode started more than 1 year ago. The problem is controlled.  Recent lipid tests were reviewed and are normal. Exacerbating diseases include diabetes and obesity. He has no history of hypothyroidism. Pertinent negatives include no chest pain, myalgias or shortness of breath. Current antihyperlipidemic treatment includes statins. The current treatment provides moderate improvement of lipids. Compliance problems include adherence to diet and adherence to exercise.  Risk factors for coronary artery disease include dyslipidemia, diabetes mellitus, hypertension, male sex and obesity.  Thyroid Problem Presents for follow-up visit. Patient reports no constipation, diaphoresis, diarrhea, fatigue, palpitations or visual change. The symptoms have been stable. His past medical history is significant for diabetes, heart failure and hyperlipidemia.  Benign Prostatic Hypertrophy This is a chronic problem. The problem has been waxing and waning since onset. Irritative symptoms do not include urgency. Pertinent negatives include no hematuria.  depression Currently on zoloft- Doing well -no C/O side effects RLS Mirapex and tramadol - helps a lot with symptoms insomnia trazadone to sleep everynight- feels rested in AM Aortic Valve replacement/ pacemaker/CAD/  On coumadin- Denies bleeding Chronic kidney disease GFR improving   Review of Systems  Constitutional: Negative for diaphoresis and fatigue.  Respiratory: Negative for cough and shortness of breath.   Cardiovascular: Negative for chest pain and palpitations.  Gastrointestinal: Negative for diarrhea, constipation and blood in stool.  Endocrine: Negative for polydipsia, polyphagia and polyuria.  Genitourinary: Negative for urgency and hematuria.  Musculoskeletal: Negative for myalgias.  Neurological: Negative for dizziness and weakness.  Psychiatric/Behavioral: Negative for sleep disturbance.  All other systems reviewed and are negative.      Objective:   Physical Exam  Constitutional: He is oriented to  person, place, and time. He appears well-developed and well-nourished.  HENT:  Head: Normocephalic.  Right Ear: External ear normal.  Left Ear: External ear normal.  Nose: Nose normal.  Mouth/Throat: Oropharynx is clear and moist.  Eyes: EOM are normal. Pupils are equal, round, and reactive to light.  Neck: Normal range of motion. Neck supple. No thyromegaly present.  Cardiovascular: Normal rate, regular rhythm, normal heart sounds and intact distal pulses.   No murmur heard. Audible aortic valve click  Pulmonary/Chest: Effort normal and breath sounds normal. He has no wheezes. He has no rales.  Abdominal: Soft. Bowel sounds are normal.  Musculoskeletal: Normal range of motion. He exhibits edema (1+ edema bil lower ext).  Neurological: He is alert and oriented to person, place, and time.  Skin: Skin is warm and dry.  Psychiatric: He has a normal mood and affect. His behavior is normal. Judgment and thought content normal.    BP 134/71 mmHg  Pulse 57  Temp(Src) 97 F (36.1 C) (Oral)  Ht _0  (1.676 m)  Wt 213 lb (96.616 kg)  BMI 34.40 kg/m2  Results for orders placed or performed in visit on 04/23/15  POCT INR  Result Value Ref Range   INR 1.9    Last hgba1c was 5.9% at [place]:   1. Atherosclerosis of native coronary artery of native heart with angina pectoris   2. Paroxysmal atrial fibrillation   3. Aortic valve disorder   4. NSTEMI (non-ST elevated myocardial infarction)   5. Essential hypertension   6. Hypothyroidism, unspecified hypothyroidism type   7. Type 2 diabetes mellitus without complication   8. Chronic kidney disease, stage 3 (moderate)   9. Hyperlipidemia with target LDL less than 100   10. Pacemaker  st [place] ordered this encounter  Medications  . amLODipine (NORVASC) 5 MG tablet    Sig: TAKE 1 TABLET (5 MG TOTAL) BY MOUTH 2 (TWO) TIMES DAILY.    Dispense:  180 tablet    Refill:  1    Order Specific Question:   Supervising Provider    Answer:  Chipper Herb [[id number]]  . methocarbamol (ROBAXIN) 500 MG tablet    Sig: Take 1 tablet (500 mg total) by mouth every 6 (six) hours as needed for muscle spasms.    Dispense:  60 tablet    Refill:  1    Order Specific Question:  Supervising Provider    Answer:  Chipper Herb [[id number]]  . warfarin (COUMADIN) 5 MG tablet    Sig: TAKE 1 &1/2 BY MOUTH EVERY DAY ON MONDAY AND THURDAY AND 1 TABLET ON ALL OTHER DAYS    Dispense:  135 tablet    Refill:  1    Order Specific Question:  Supervising Provider    Answer:  Chipper Herb [[id number]]  . pravastatin (PRAVACHOL) 40 MG tablet    Sig: TAKE 1 TABLET (40 MG TOTAL) BY MOUTH EVERY EVENING. FOR CHOLESTEROL    Dispense:  90 tablet    Refill:  1    Must be seen before next refill    Order Specific Question:  Supervising Provider    Answer:  Chipper Herb [[id number]]  . levothyroxine (SYNTHROID, LEVOTHROID) 88 MCG tablet    Sig: Take 1 tablet (88 mcg total) by mouth daily.    Dispense:  90 tablet    Refill:  2    Order Specific Question:  Supervising Provider    Answer:  Chipper Herb [[id number]]   Orders Placed This Encounter  Procedures  . CMP14+EGFR  . Lipid panel   Low  fat diet and exercise Refuses some of health maintenance See coumadin documantation- recheck INR iin 2 weeks RTO in 3 months follow up   S[place], FNP

## 2015-04-24 LAB — LIPID PANEL
CHOLESTEROL TOTAL: 204 mg/dL — AB (ref 100–199)
Chol/HDL Ratio: 3.8 ratio units (ref 0.0–5.0)
HDL: 54 mg/dL (ref >39)
LDL CALC: 133 mg/dL — AB (ref 0–99)
TRIGLYCERIDES: 87 mg/dL (ref 0–149)
VLDL Cholesterol Cal: 17 mg/dL (ref 5–40)

## 2015-04-24 LAB — CMP14+EGFR
ALK PHOS: 81 IU/L (ref 39–117)
ALT: 14 IU/L (ref 0–44)
AST: 21 IU/L (ref 0–40)
Albumin/Globulin Ratio: 1.5 (ref 1.1–2.5)
Albumin: 4.3 g/dL (ref 3.6–4.8)
BUN/Creatinine Ratio: 15 (ref 10–22)
BUN: 14 mg/dL (ref 8–27)
Bilirubin Total: 0.5 mg/dL (ref 0.0–1.2)
CALCIUM: 9.3 mg/dL (ref 8.6–10.2)
CO2: 23 mmol/L (ref 18–29)
CREATININE: 0.92 mg/dL (ref 0.76–1.27)
Chloride: 100 mmol/L (ref 97–108)
GFR calc Af Amer: 102 mL/min/{1.73_m2} (ref >59)
GFR calc non Af Amer: 88 mL/min/{1.73_m2} (ref >59)
Globulin, Total: 2.8 g/dL (ref 1.5–4.5)
Glucose: 83 mg/dL (ref 65–99)
Potassium: 4.2 mmol/L (ref 3.5–5.2)
Sodium: 139 mmol/L (ref 134–144)
Total Protein: 7.1 g/dL (ref 6.0–8.5)

## 2015-05-06 ENCOUNTER — Encounter: Payer: PPO | Admitting: Internal Medicine

## 2015-05-13 ENCOUNTER — Encounter: Payer: Self-pay | Admitting: Pharmacist

## 2015-05-19 ENCOUNTER — Telehealth: Payer: Self-pay | Admitting: Nurse Practitioner

## 2015-05-19 NOTE — Telephone Encounter (Signed)
appt made for 05/27/2015 at 10:30am

## 2015-05-27 ENCOUNTER — Encounter: Payer: Self-pay | Admitting: Pharmacist

## 2015-06-09 ENCOUNTER — Encounter: Payer: PPO | Admitting: Internal Medicine

## 2015-06-11 ENCOUNTER — Other Ambulatory Visit: Payer: Self-pay | Admitting: Nurse Practitioner

## 2015-06-25 ENCOUNTER — Telehealth: Payer: Self-pay | Admitting: Nurse Practitioner

## 2015-06-25 NOTE — Telephone Encounter (Signed)
Please advise 

## 2015-06-25 NOTE — Telephone Encounter (Signed)
NTBS.

## 2015-06-25 NOTE — Telephone Encounter (Signed)
Patient aware he will need to be seen  

## 2015-06-28 ENCOUNTER — Ambulatory Visit: Payer: PPO | Admitting: Cardiology

## 2015-06-28 ENCOUNTER — Other Ambulatory Visit (HOSPITAL_COMMUNITY): Payer: PPO

## 2015-06-28 ENCOUNTER — Other Ambulatory Visit: Payer: Self-pay | Admitting: Cardiovascular Disease

## 2015-06-28 DIAGNOSIS — I350 Nonrheumatic aortic (valve) stenosis: Secondary | ICD-10-CM

## 2015-06-30 ENCOUNTER — Encounter: Payer: PPO | Admitting: Nurse Practitioner

## 2015-07-14 ENCOUNTER — Telehealth: Payer: Self-pay | Admitting: Pharmacist

## 2015-07-14 NOTE — Telephone Encounter (Signed)
Patient in need of INR - he actually has checked 9 days ago at [facility].  Was 2.3.  INR appt made for 08/18/2015

## 2015-07-17 ENCOUNTER — Other Ambulatory Visit: Payer: Self-pay | Admitting: Nurse Practitioner

## 2015-07-26 ENCOUNTER — Ambulatory Visit (INDEPENDENT_AMBULATORY_CARE_PROVIDER_SITE_OTHER): Payer: PPO | Admitting: Cardiovascular Disease

## 2015-07-26 ENCOUNTER — Encounter: Payer: Self-pay | Admitting: Cardiovascular Disease

## 2015-07-26 VITALS — BP 130/66 | HR 69 | Ht 66.0 in | Wt 222.1 lb

## 2015-07-26 DIAGNOSIS — I442 Atrioventricular block, complete: Secondary | ICD-10-CM | POA: Diagnosis not present

## 2015-07-26 DIAGNOSIS — Z952 Presence of prosthetic heart valve: Secondary | ICD-10-CM

## 2015-07-26 DIAGNOSIS — I10 Essential (primary) hypertension: Secondary | ICD-10-CM | POA: Diagnosis not present

## 2015-07-26 DIAGNOSIS — I2510 Atherosclerotic heart disease of native coronary artery without angina pectoris: Secondary | ICD-10-CM

## 2015-07-26 DIAGNOSIS — Z954 Presence of other heart-valve replacement: Secondary | ICD-10-CM
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
## 2015-07-26 NOTE — Patient Instructions (Signed)
Medication Instructions:  Your physician recommends that you continue on your current medications as directed. Please refer to the Current Medication list given to you today.   Labwork: none  Testing/Procedures: Echo scheduled for 11/23  Follow-Up: Your physician wants you to follow-up in: 12 months.  You will receive a reminder letter in the mail two months in advance. If you don't receive a letter, please call our office to schedule the follow-up appointment.   Any Other Special Instructions Will Be Listed Below (If Applicable).     If you need a refill on your cardiac medications before your next appointment, please call your pharmacy.

## 2015-07-28 ENCOUNTER — Ambulatory Visit (HOSPITAL_COMMUNITY): Payer: PPO | Attending: Cardiovascular Disease

## 2015-07-28 DIAGNOSIS — R0989 Other specified symptoms and signs involving the circulatory and respiratory systems: Secondary | ICD-10-CM

## 2015-08-02 ENCOUNTER — Telehealth: Payer: Self-pay | Admitting: Cardiovascular Disease

## 2015-08-02 NOTE — Telephone Encounter (Signed)
Spoke with pt. He reports he was late for echo appt today and has to reschedule. He would like to know why he needs echo done.  I explained to pt the reason echo was ordered.  Echo rescheduled to December 20,2016 at 10:30.

## 2015-08-02 NOTE — Telephone Encounter (Signed)
New message      Pt is calling to see why did he have an echo appt scheduled when he just had one in April?  Pt was too late for his appt for an echo and was not see.

## 2015-08-18 ENCOUNTER — Ambulatory Visit: Payer: Self-pay | Admitting: Pharmacist

## 2015-08-24 ENCOUNTER — Other Ambulatory Visit (HOSPITAL_COMMUNITY): Payer: PPO

## 2015-08-25 ENCOUNTER — Encounter: Payer: Self-pay | Admitting: Pharmacist

## 2015-08-25 ENCOUNTER — Ambulatory Visit (INDEPENDENT_AMBULATORY_CARE_PROVIDER_SITE_OTHER): Payer: PPO | Admitting: Pharmacist

## 2015-08-25 VITALS — BP 134/74 | HR 68 | Ht 65.5 in | Wt 221.0 lb

## 2015-08-25 DIAGNOSIS — Z1211 Encounter for screening for malignant neoplasm of colon: Secondary | ICD-10-CM

## 2015-08-25 DIAGNOSIS — Z794 Long term (current) use of insulin: Secondary | ICD-10-CM

## 2015-08-25 DIAGNOSIS — Z0000 Encounter for general adult medical examination without abnormal findings: Secondary | ICD-10-CM | POA: Diagnosis not present

## 2015-08-25 DIAGNOSIS — E1140 Type 2 diabetes mellitus with diabetic neuropathy, unspecified: Secondary | ICD-10-CM

## 2015-08-25 DIAGNOSIS — I480 Paroxysmal atrial fibrillation: Secondary | ICD-10-CM | POA: Diagnosis not present

## 2015-08-25 DIAGNOSIS — Z23 Encounter for immunization: Secondary | ICD-10-CM | POA: Diagnosis not present

## 2015-08-25 LAB — POCT INR: INR: 2.8

## 2015-08-25 NOTE — Patient Instructions (Addendum)
Andre Malone , Thank you for taking time to come for your Medicare Wellness Visit. I appreciate your ongoing commitment to your health goals. Please review the following plan we discussed and let me know if I can assist you in the future.   These are the goals we discussed: Return stool and urine test as soon as possible Continue to be active as much as possible.   Increase non-starchy vegetables - carrots, green bean, squash, zucchini, tomatoes, onions, cauliflower, cucumbers, asparagus, okra (not fried), eggplant limit sugar and processed foods (cakes, cookies, ice cream, crackers and chips) Increase fresh fruit but limit serving sizes 1/2 cup or about the size of tennis or baseball limit red meat to no more than 1-2 times per week (serving size about the size of your palm) Choose whole grains / lean proteins - whole wheat bread, quinoa, whole grain rice (1/2 cup), fish, chicken, [place]   This is a list of the screening recommended for you and due dates:  Health Maintenance  Topic Date Due  . Shingles Vaccine  10/24/2015*  . Flu Shot   Fall 2018  .  Hepatitis C: One time screening is recommended by Center for Disease Control  (CDC) for  adults born from [birth year] through 1965.   04/22/2016*  . HIV Screening  04/22/2016*  . Hemoglobin A1C  10/24/2015  . Urine Protein Check  02/10/2016  . Eye exam for diabetics  03/22/2016  . Complete foot exam   04/22/2016  . Tetanus Vaccine  09/16/2021  *Topic was postponed. The date shown is not the original due date.    Fall Prevention in the Home  Falls can cause injuries and can affect people from all age groups. There are many simple things that you can do to make your home safe and to help prevent falls. WHAT CAN I DO ON THE OUTSIDE OF MY HOME?  Regularly repair the edges of walkways and driveways and fix any cracks.  Remove high doorway thresholds.  Trim any shrubbery on the main path into your home.  Use bright outdoor lighting.  Clear  walkways of debris and clutter, including tools and rocks.  Regularly check that handrails are securely fastened and in good repair. Both sides of any steps should have handrails.  Install guardrails along the edges of any raised decks or porches.  Have leaves, snow, and ice cleared regularly.  Use sand or salt on walkways during winter months.  In the garage, clean up any spills right away, including grease or oil spills. WHAT CAN I DO IN THE BATHROOM?  Use night lights.  Install grab bars by the toilet and in the tub and shower. Do not use towel bars as grab bars.  Use non-skid mats or decals on the floor of the tub or shower.  If you need to sit down while you are in the shower, use a plastic, non-slip stool.[employer]  Keep the floor dry. Immediately clean up any water that spills on the floor.  Remove soap buildup in the tub or shower on a regular basis.  Attach bath mats securely with double-sided non-slip rug tape.  Remove throw rugs and other tripping hazards from the floor. WHAT CAN I DO IN THE BEDROOM?  Use night lights.  Make sure that a bedside light is easy to reach.  Do not use oversized bedding that drapes onto the floor.  Have a firm chair that has side arms to use for getting dressed.  Remove throw rugs and other  tripping hazards from the floor. WHAT CAN I DO IN THE KITCHEN?   Clean up any spills right away.  Avoid walking on wet floors.  Place frequently used items in easy-to-reach places.  If you need to reach for something above you, use a sturdy step stool that has a grab bar.  Keep electrical cables out of the way.  Do not use floor polish or wax that makes floors slippery. If you have to use wax, make sure that it is non-skid floor wax.  Remove throw rugs and other tripping hazards from the floor. WHAT CAN I DO IN THE STAIRWAYS?  Do not leave any items on the stairs.  Make sure that there are handrails on both sides of the stairs. Fix handrails  that are broken or loose. Make sure that handrails are as long as the stairways.  Check any carpeting to make sure that it is firmly attached to the stairs. Fix any carpet that is loose or worn.  Avoid having throw rugs at the top or bottom of stairways, or secure the rugs with carpet tape to prevent them from moving.  Make sure that you have a light switch at the top of the stairs and the bottom of the stairs. If you do not have them, have them installed. WHAT ARE SOME OTHER FALL PREVENTION TIPS?  Wear closed-toe shoes that fit well and support your feet. Wear shoes that have rubber soles or low heels.  When you use a stepladder, make sure that it is completely opened and that the sides are firmly locked. Have someone hold the ladder while you are using it. Do not climb a closed stepladder.  Add color or contrast paint or tape to grab bars and handrails in your home. Place contrasting color strips on the first and last steps.  Use mobility aids as needed, such as canes, walkers, scooters, and crutches.  Turn on lights if it is dark. Replace any light bulbs that burn out.  Set up furniture so that there are clear paths. Keep the furniture in the same spot.  Fix any uneven floor surfaces.  Choose a carpet design that does not hide the edge of steps of a stairway.  Be aware of any and all pets.  Review your medicines with your healthcare provider. Some medicines can cause dizziness or changes in blood pressure, which increase your risk of falling. Talk with your health care provider about other ways that you can decrease your risk of falls. This may include working with a physical therapist or trainer to improve your strength, balance, and endurance.   This information is not intended to replace advice given to you by your health care provider. Make sure you discuss any questions you have with your health care provider.   Document Released: 08/11/2002 Document Revised: 01/05/2015  Document Reviewed: 09/25/2014 Elsevier Interactive Patient Education 2016 [employer].   Health Maintenance, Male A healthy lifestyle and preventative care can promote health and wellness.  Maintain regular health, dental, and eye exams.  Eat a healthy diet. Foods like vegetables, fruits, whole grains, low-fat dairy products, and lean protein foods contain the nutrients you need and are low in calories. Decrease your intake of foods high in solid fats, added sugars, and salt. Get information about a proper diet from your health care provider, if necessary.  Regular physical exercise is one of the most important things you can do for your health. Most adults should get at least 150 minutes of moderate-intensity  exercise (any activity that increases your heart rate and causes you to sweat) each week. In addition, most adults need muscle-strengthening exercises on 2 or more days a week.   Maintain a healthy weight. The body mass index (BMI) is a screening tool to identify possible weight problems. It provides an estimate of body fat based on height and weight. Your health care provider can find your BMI and can help you achieve or maintain a healthy weight. For males 20 years and older:  A BMI below 18.5 is considered underweight.  A BMI of 18.5 to 24.9 is normal.  A BMI of 25 to 29.9 is considered overweight.  A BMI of 30 and above is considered obese.  Maintain normal blood lipids and cholesterol by exercising and minimizing your intake of saturated fat. Eat a balanced diet with plenty of fruits and vegetables. Blood tests for lipids and cholesterol should begin at age 64 and be repeated every 5 years. If your lipid or cholesterol levels are high, you are over age 950, or you are at high risk for heart disease, you may need your cholesterol levels checked more frequently.Ongoing high lipid and cholesterol levels should be treated with medicines if diet and exercise are not working.  If you  smoke, find out from your health care provider how to quit. If you do not use tobacco, do not start.  Lung cancer screening is recommended for adults aged 55-80 years who are at high risk for developing lung cancer because of a history of smoking. A yearly low-dose CT scan of the lungs is recommended for people who have at least a 30-pack-year history of smoking and are current smokers or have quit within the past 15 years. A pack year of smoking is smoking an average of 1 pack of cigarettes a day for 1 year (for example, a 30-pack-year history of smoking could mean smoking 1 pack a day for 30 years or 2 packs a day for 15 years). Yearly screening should continue until the smoker has stopped smoking for at least 15 years. Yearly screening should be stopped for people who develop a health problem that would prevent them from having lung cancer treatment.  If you choose to drink alcohol, do not have more than 2 drinks per day. One drink is considered to be 12 oz (360 mL) of beer, 5 oz (150 mL) of wine, or 1.5 oz (45 mL) of liquor.  Avoid the use of street drugs. Do not share needles with anyone. Ask for help if you need support or instructions about stopping the use of drugs.  High blood pressure causes heart disease and increases the risk of stroke. High blood pressure is more likely to develop in:  People who have blood pressure in the end of the normal range (100-139/85-89 mm Hg).  People who are overweight or obese.  People who are African American.  If you are 5118-64 years of age, have your blood pressure checked every 3-5 years. If you are 64 years of age or older, have your blood pressure checked every year. You should have your blood pressure measured twice--once when you are at a hospital or clinic, and once when you are not at a hospital or clinic. Record the average of the two measurements. To check your blood pressure when you are not at a hospital or clinic, you can use:  An automated  blood pressure machine at a pharmacy.  A home blood pressure monitor.  If you are 45-79  years old, ask your health care provider if you should take aspirin to prevent heart disease.  Diabetes screening involves taking a blood sample to check your fasting blood sugar level. This should be done once every 3 years after age 4 if you are at a normal weight and without risk factors for diabetes. Testing should be considered at a younger age or be carried out more frequently if you are overweight and have at least 1 risk factor for diabetes.  Colorectal cancer can be detected and often prevented. Most routine colorectal cancer screening begins at the age of 55 and continues through age 76. However, your health care provider may recommend screening at an earlier age if you have risk factors for colon cancer. On a yearly basis, your health care provider may provide home test kits to check for hidden blood in the stool. A small camera at the end of a tube may be used to directly examine the colon (sigmoidoscopy or colonoscopy) to detect the earliest forms of colorectal cancer. Talk to your health care provider about this at age 51 when routine screening begins. A direct exam of the colon should be repeated every 5-10 years through age 6, unless early forms of precancerous polyps or small growths are found.  People who are at an increased risk for hepatitis B should be screened for this virus. You are considered at high risk for hepatitis B if:  You were born in a country where hepatitis B occurs often. Talk with your health care provider about which countries are considered high risk.  Your parents were born in a high-risk country and you have not received a shot to protect against hepatitis B (hepatitis B vaccine).  You have HIV or AIDS.  You use needles to inject street drugs.  You live with, or have sex with, someone who has hepatitis B.  You are a man who has sex with other men (MSM).  You get  hemodialysis treatment.  You take certain medicines for conditions like cancer, organ transplantation, and autoimmune conditions.  Hepatitis C blood testing is recommended for all people born from [birth year] through 1965 and any individual with known risk factors for hepatitis C.  Healthy men should no longer receive prostate-specific antigen (PSA) blood tests as part of routine cancer screening. Talk to your health care provider about prostate cancer screening.  Testicular cancer screening is not recommended for adolescents or adult males who have no symptoms. Screening includes self-exam, a health care provider exam, and other screening tests. Consult with your health care provider about any symptoms you have or any concerns you have about testicular cancer.  Practice safe sex. Use condoms and avoid high-risk sexual practices to reduce the spread of sexually transmitted infections (STIs).  You should be screened for STIs, including gonorrhea and chlamydia if:  You are sexually active and are younger than 24 years.  You are older than 24 years, and your health care provider tells you that you are at risk for this type of infection.  Your sexual activity has changed since you were last screened, and you are at an increased risk for chlamydia or gonorrhea. Ask your health care provider if you are at risk.  If you are at risk of being infected with HIV, it is recommended that you take a prescription medicine daily to prevent HIV infection. This is called pre-exposure prophylaxis (PrEP). You are considered at risk if:  You are a man who has sex with other men (MSM).  You are a heterosexual man who is sexually active with multiple partners.  You take drugs by injection.  You are sexually active with a partner who has HIV.  Talk with your health care provider about whether you are at high risk of being infected with HIV. If you choose to begin PrEP, you should first be tested for HIV. You should  then be tested every 3 months for as long as you are taking PrEP.  Use sunscreen. Apply sunscreen liberally and repeatedly throughout the day. You should seek shade when your shadow is shorter than you. Protect yourself by wearing long sleeves, pants, a wide-brimmed hat, and sunglasses year round whenever you are outdoors.  Tell your health care provider of new moles or changes in moles, especially if there is a change in shape or color. Also, tell your health care provider if a mole is larger than the size of a pencil eraser.  A one-time screening for abdominal aortic aneurysm (AAA) and surgical repair of large AAAs by ultrasound is recommended for men aged 65-75 years who are current or former smokers.  Stay current with your vaccines (immunizations).   This information is not intended to replace advice given to you by your health care provider. Make sure you discuss any questions you have with your health care provider.   Document Released: 02/17/2008 Document Revised: 09/11/2014 Document Reviewed: 01/16/2011 Elsevier Interactive Patient Education [employer]
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
## 2015-08-26 LAB — MICROALBUMIN / CREATININE URINE RATIO
CREATININE, UR: 93.5 mg/dL
MICROALB/CREAT RATIO: 84.6 mg/g{creat} — AB (ref 0.0–30.0)
MICROALBUM., U, RANDOM: 79.1 ug/mL

## 2015-09-06 ENCOUNTER — Other Ambulatory Visit: Payer: Self-pay | Admitting: Nurse Practitioner

## 2015-09-09 ENCOUNTER — Other Ambulatory Visit: Payer: PPO

## 2015-09-09 DIAGNOSIS — Z1212 Encounter for screening for malignant neoplasm of rectum: Secondary | ICD-10-CM | POA: Diagnosis not present

## 2015-09-09 NOTE — Progress Notes (Signed)
LAB only 

## 2015-09-10 LAB — FECAL OCCULT BLOOD, IMMUNOCHEMICAL: Fecal Occult Bld: NEGATIVE

## 2015-09-23 ENCOUNTER — Encounter: Payer: Self-pay | Admitting: Internal Medicine

## 2015-09-23 ENCOUNTER — Ambulatory Visit (INDEPENDENT_AMBULATORY_CARE_PROVIDER_SITE_OTHER): Payer: PPO | Admitting: Internal Medicine

## 2015-09-23 VITALS — BP 146/88 | HR 69 | Ht 66.0 in | Wt 220.8 lb

## 2015-09-23 DIAGNOSIS — Z950 Presence of cardiac pacemaker: Secondary | ICD-10-CM | POA: Diagnosis not present

## 2015-09-23 DIAGNOSIS — I442 Atrioventricular block, complete: Secondary | ICD-10-CM

## 2015-09-23 DIAGNOSIS — I480 Paroxysmal atrial fibrillation: Secondary | ICD-10-CM | POA: Diagnosis not present

## 2015-09-23 NOTE — Patient Instructions (Addendum)
Medication Instructions: 1) Stop amlodipine   Labwork: - none  Procedures/Testing: - none  Follow-Up: - Remote monitoring is used to monitor your Pacemaker of ICD from home. This monitoring reduces the number of office visits required to check your device to one time per year. It allows [place] to keep an eye on the functioning of your device to ensure it is working properly. You are scheduled for a device check from home on 12/23/15. You may send your transmission at any time that day. If you have a wireless device, the transmission will be sent automatically. After your physician reviews your transmission, you will receive a postcard with your next transmission date.  - Your physician wants you to follow-up in: 1 year with Dr. Logan Bores will receive a reminder letter in the mail two months in advance. If you don't receive a letter, please call our office to schedule the follow-up appointment.  Any Additional Special Instructions Will Be Listed Below (If Applicable). - Please call the office back in 2 weeks to let Dr. Graciela Husbands know what your blood pressure readings are and how your swelling is doing    If you need a refill on your cardiac medications before your next appointment, please call your pharmacy.
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
## 2015-09-24 ENCOUNTER — Ambulatory Visit: Payer: PPO | Admitting: Nurse Practitioner

## 2015-09-27 ENCOUNTER — Encounter: Payer: Self-pay | Admitting: Nurse Practitioner

## 2015-10-06 ENCOUNTER — Encounter: Payer: Self-pay | Admitting: Nurse Practitioner

## 2015-10-06 ENCOUNTER — Ambulatory Visit (INDEPENDENT_AMBULATORY_CARE_PROVIDER_SITE_OTHER): Payer: PPO | Admitting: Nurse Practitioner

## 2015-10-06 ENCOUNTER — Ambulatory Visit (INDEPENDENT_AMBULATORY_CARE_PROVIDER_SITE_OTHER): Payer: PPO

## 2015-10-06 VITALS — BP 137/80 | HR 61 | Temp 97.4°F

## 2015-10-06 DIAGNOSIS — Z950 Presence of cardiac pacemaker: Secondary | ICD-10-CM | POA: Diagnosis not present

## 2015-10-06 DIAGNOSIS — E039 Hypothyroidism, unspecified: Secondary | ICD-10-CM | POA: Diagnosis not present

## 2015-10-06 DIAGNOSIS — Z794 Long term (current) use of insulin: Secondary | ICD-10-CM | POA: Diagnosis not present

## 2015-10-06 DIAGNOSIS — N183 Chronic kidney disease, stage 3 unspecified: Secondary | ICD-10-CM

## 2015-10-06 DIAGNOSIS — E785 Hyperlipidemia, unspecified: Secondary | ICD-10-CM | POA: Diagnosis not present

## 2015-10-06 DIAGNOSIS — E1140 Type 2 diabetes mellitus with diabetic neuropathy, unspecified: Secondary | ICD-10-CM

## 2015-10-06 DIAGNOSIS — I25119 Atherosclerotic heart disease of native coronary artery with unspecified angina pectoris: Secondary | ICD-10-CM

## 2015-10-06 DIAGNOSIS — I359 Nonrheumatic aortic valve disorder, unspecified: Secondary | ICD-10-CM

## 2015-10-06 DIAGNOSIS — M7052 Other bursitis of knee, left knee: Secondary | ICD-10-CM

## 2015-10-06 DIAGNOSIS — I480 Paroxysmal atrial fibrillation: Secondary | ICD-10-CM | POA: Diagnosis not present

## 2015-10-06 DIAGNOSIS — I10 Essential (primary) hypertension: Secondary | ICD-10-CM | POA: Diagnosis not present

## 2015-10-06 DIAGNOSIS — M25562 Pain in left knee: Secondary | ICD-10-CM

## 2015-10-06 DIAGNOSIS — Z1159 Encounter for screening for other viral diseases: Secondary | ICD-10-CM | POA: Diagnosis not present

## 2015-10-06 LAB — POCT GLYCOSYLATED HEMOGLOBIN (HGB A1C): HEMOGLOBIN A1C: 5.8

## 2015-10-06 LAB — POCT INR: INR: 3.3

## 2015-10-06 MED ORDER — METFORMIN HCL 1000 MG PO TABS: ORAL_TABLET | ORAL | Status: DC

## 2015-10-06 MED ORDER — WARFARIN SODIUM 5 MG PO TABS: ORAL_TABLET | ORAL | Status: DC

## 2015-10-06 MED ORDER — LEVOTHYROXINE SODIUM 88 MCG PO TABS: 88.0000 ug | ORAL_TABLET | Freq: Every day | ORAL | Status: DC

## 2015-10-06 MED ORDER — TRAZODONE HCL 100 MG PO TABS: ORAL_TABLET | ORAL | Status: DC

## 2015-10-06 MED ORDER — PRAVASTATIN SODIUM 40 MG PO TABS
ORAL_TABLET | ORAL | Status: DC
Start: 2015-10-06 — End: 2016-04-07

## 2015-10-06 NOTE — Progress Notes (Signed)
Subjective:    Patient ID: Andre Malone, male    DOB: 1950-10-26, 65 y.o.   MRN: [id number]  Patient here today for follow up of chronic medical problems.  Outpatient Encounter Prescriptions as of 10/06/2015  Medication Sig  . insulin glargine (LANTUS) 100 UNIT/ML injection Inject 50 Units into the skin daily after supper.   . levothyroxine (SYNTHROID, LEVOTHROID) 88 MCG tablet Take 1 tablet (88 mcg total) by mouth daily.  . metFORMIN (GLUCOPHAGE) 1000 MG tablet TAKE 1 TABLET (1,000 MG TOTAL) BY MOUTH 2 (TWO) TIMES DAILY WITH A MEAL.  . methocarbamol (ROBAXIN) 500 MG tablet Take 1 tablet (500 mg total) by mouth every 6 (six) hours as needed for muscle spasms.  Marland Kitchen oxyCODONE (ROXICODONE) 15 MG immediate release tablet Take 15 mg by mouth every 6 (six) hours as needed.  . pramipexole (MIRAPEX) 0.5 MG tablet Take 1 mg by mouth at bedtime.  . pravastatin (PRAVACHOL) 40 MG tablet TAKE 1 TABLET (40 MG TOTAL) BY MOUTH EVERY EVENING. FOR CHOLESTEROL  . sertraline (ZOLOFT) 100 MG tablet Take 100 mg by mouth daily.    . Tamsulosin HCl (FLOMAX) 0.4 MG CAPS Take 0.4 mg by mouth daily as needed. For kidney stones  . traMADol (ULTRAM) 50 MG tablet Take 50 mg by mouth every 12 (twelve) hours as needed for moderate pain.   . traZODone (DESYREL) 100 MG tablet TAKE ONE TABLET BY MOUTH AT BEDTIME  . warfarin (COUMADIN) 5 MG tablet TAKE 1 &1/2 BY MOUTH EVERY DAY ON MONDAY AND THURDAY AND 1 TABLET ON ALL OTHER DAYS  . [DISCONTINUED] levothyroxine (SYNTHROID, LEVOTHROID) 88 MCG tablet TAKE ONE TABLET BY MOUTH ONCE DAILY  . [DISCONTINUED] metFORMIN (GLUCOPHAGE) 1000 MG tablet TAKE 1 TABLET (1,000 MG TOTAL) BY MOUTH 2 (TWO) TIMES DAILY WITH A MEAL.  . [DISCONTINUED] pravastatin (PRAVACHOL) 40 MG tablet TAKE 1 TABLET (40 MG TOTAL) BY MOUTH EVERY EVENING. FOR CHOLESTEROL  . [DISCONTINUED] traZODone (DESYREL) 100 MG tablet TAKE ONE TABLET BY MOUTH AT BEDTIME (Patient taking differently: 100 mg at bedtime as needed.  TAKE ONE TABLET BY MOUTH AT BEDTIME)  . [DISCONTINUED] warfarin (COUMADIN) 5 MG tablet TAKE 1 &1/2 BY MOUTH EVERY DAY ON MONDAY AND THURDAY AND 1 TABLET ON ALL OTHER DAYS  . aspirin EC 81 MG EC tablet Take 1 tablet (81 mg total) by mouth daily. (Patient not taking: Reported on 10/06/2015)   No facility-administered encounter medications on file as of 10/06/2015.      Diabetes He presents for his follow-up diabetic visit. He has type 2 diabetes mellitus. His disease course has been stable. Pertinent negatives for hypoglycemia include no dizziness. Pertinent negatives for diabetes include no chest pain, no fatigue, no polydipsia, no polyphagia, no polyuria, no visual change and no weakness. Diabetic complications include heart disease and peripheral neuropathy. Pertinent negatives for diabetic complications include no CVA or retinopathy. Risk factors for coronary artery disease include diabetes mellitus, dyslipidemia, hypertension, male sex and obesity. Current diabetic treatment includes insulin injections and oral agent (monotherapy). He is compliant with treatment most of the time. His weight is stable. He has not had a previous visit with a dietitian. He rarely participates in exercise. His breakfast blood glucose is taken between 8-9 am. His breakfast blood glucose range is generally 110-130 mg/dl. His overall blood glucose range is 110-130 mg/dl. An ACE inhibitor/angiotensin II receptor blocker is not being taken. He does not see a podiatrist.Eye exam is current.  Hypertension This is a chronic problem. The  current episode started more than 1 year ago. The problem is controlled. Pertinent negatives include no chest pain, palpitations or shortness of breath. Past treatments include calcium channel blockers. The current treatment provides moderate improvement. Compliance problems include diet and exercise.  Hypertensive end-organ damage includes CAD/MI, heart failure and a thyroid problem. There is no  history of CVA or retinopathy.  Hyperlipidemia This is a chronic problem. The current episode started more than 1 year ago. The problem is controlled. Recent lipid tests were reviewed and are normal. Exacerbating diseases include diabetes and obesity. He has no history of hypothyroidism. Pertinent negatives include no chest pain, myalgias or shortness of breath. Current antihyperlipidemic treatment includes statins. The current treatment provides moderate improvement of lipids. Compliance problems include adherence to diet and adherence to exercise.  Risk factors for coronary artery disease include dyslipidemia, diabetes mellitus, hypertension, male sex and obesity.  Thyroid Problem Presents for follow-up visit. Patient reports no constipation, diaphoresis, diarrhea, fatigue, palpitations or visual change. The symptoms have been stable. His past medical history is significant for diabetes, heart failure and hyperlipidemia.  Benign Prostatic Hypertrophy This is a chronic problem. The problem has been waxing and waning since onset. Irritative symptoms do not include urgency. Pertinent negatives include no hematuria.  depression Currently on zoloft- Doing well -no C/O side effects RLS Mirapex and tramadol - helps a lot with symptoms insomnia trazadone to sleep everynight- feels rested in AM Aortic Valve replacement/ pacemaker/CAD/  On coumadin- Denies bleeding. Had pacemaker leads replaced 09/17/13. Chronic kidney disease GFR improving   Review of Systems  Constitutional: Negative for diaphoresis and fatigue.  Respiratory: Negative for cough and shortness of breath.   Cardiovascular: Negative for chest pain and palpitations.  Gastrointestinal: Negative for diarrhea, constipation and blood in stool.  Endocrine: Negative for polydipsia, polyphagia and polyuria.  Genitourinary: Negative for urgency and hematuria.  Musculoskeletal: Negative for myalgias.  Neurological: Negative for dizziness and  weakness.  Psychiatric/Behavioral: Negative for sleep disturbance.  All other systems reviewed and are negative.      Objective:   Physical Exam  Constitutional: He is oriented to person, place, and time. He appears well-developed and well-nourished.  HENT:  Head: Normocephalic.  Right Ear: External ear normal.  Left Ear: External ear normal.  Nose: Nose normal.  Mouth/Throat: Oropharynx is clear and moist.  Eyes: EOM are normal. Pupils are equal, round, and reactive to light.  Neck: Normal range of motion. Neck supple. No thyromegaly present.  Cardiovascular: Normal rate, regular rhythm, normal heart sounds and intact distal pulses.   No murmur heard. Audible aortic valve click  Pulmonary/Chest: Effort normal and breath sounds normal. He has no wheezes. He has no rales.  Abdominal: Soft. Bowel sounds are normal.  Musculoskeletal: Normal range of motion. He exhibits edema (1+ edema bil lower ext).  Neurological: He is alert and oriented to person, place, and time.  Skin: Skin is warm and dry.  Psychiatric: He has a normal mood and affect. His behavior is normal. Judgment and thought content normal.    BP 137/80 mmHg  Pulse 61  Temp(Src) 97.4 F (36.3 C) (Oral)  Results for orders placed or performed in visit on 10/06/15  POCT INR  Result Value Ref Range   INR 3.3    Last hgba1c was 5.9% at [facility]  Left knee xray- no fracture or dislocation- mild joint space narrowing-Preliminary reading by Ronnald Collum, FNP  [facility]      Assessment & Plan:   1. Left knee  pain RICE care discussed - DG Knee 1-2 Views Left; Future  2. Paroxysmal atrial fibrillation (HCC) - POCT INR  3. Aortic valve disorder Keep follow up with cardiologist - warfarin (COUMADIN) 5 MG tablet; TAKE 1 &1/2 BY MOUTH EVERY DAY ON MONDAY AND THURDAY AND 1 TABLET ON ALL OTHER DAYS  Dispense: 135 tablet; Refill: 1  4. Hyperlipidemia with target LDL less than 100 Low fat diet - Lipid panel - pravastatin  (PRAVACHOL) 40 MG tablet; TAKE 1 TABLET (40 MG TOTAL) BY MOUTH EVERY EVENING. FOR CHOLESTEROL  Dispense: 90 tablet; Refill: 1  5. Atherosclerosis of native coronary artery of native heart with angina pectoris ([place])  6. Essential hypertension Do not add salt to diet - CMP14+EGFR  7. Hypothyroidism, unspecified hypothyroidism type  8. Type 2 diabetes mellitus with diabetic neuropathy, with long-term current use of insulin (HCC) Stricter carb counting  9. Chronic kidney disease, stage 3 (moderate)   10. Pacemaker  st Judes   12. Need for hepatitis C screening test - Hepatitis C antibody    Labs pending Health maintenance reviewed Diet and exercise encouraged Continue all meds Follow up  In 3 months  [place], FNP

## 2015-10-06 NOTE — Patient Instructions (Signed)
Anticoagulation Dose Instructions as of 10/06/2015      Andre Malone Tue Wed Thu Fri Sat   New Dose 5 mg 5 mg 5 mg 5 mg 5 mg 5 mg 5 mg    Description         Hold today and then continue Continue current warfarin dose of  - tablet daily       recheck in 2 weeks

## 2015-10-07 ENCOUNTER — Other Ambulatory Visit: Payer: Self-pay | Admitting: Nurse Practitioner

## 2015-10-07 LAB — LIPID PANEL
Chol/HDL Ratio: 4.8 ratio units (ref 0.0–5.0)
Cholesterol, Total: 203 mg/dL — ABNORMAL HIGH (ref 100–199)
HDL: 42 mg/dL (ref >39)
LDL Calculated: 127 mg/dL — ABNORMAL HIGH (ref 0–99)
Triglycerides: 168 mg/dL — ABNORMAL HIGH (ref 0–149)
VLDL CHOLESTEROL CAL: 34 mg/dL (ref 5–40)

## 2015-10-07 LAB — CMP14+EGFR
A/G RATIO: 1.5 (ref 1.1–2.5)
ALBUMIN: 4.4 g/dL (ref 3.6–4.8)
ALT: 13 IU/L (ref 0–44)
AST: 23 IU/L (ref 0–40)
Alkaline Phosphatase: 86 IU/L (ref 39–117)
BILIRUBIN TOTAL: 0.4 mg/dL (ref 0.0–1.2)
BUN / CREAT RATIO: 13 (ref 10–22)
BUN: 12 mg/dL (ref 8–27)
CHLORIDE: 102 mmol/L (ref 96–106)
CO2: 21 mmol/L (ref 18–29)
Calcium: 9.4 mg/dL (ref 8.6–10.2)
Creatinine, Ser: 0.94 mg/dL (ref 0.76–1.27)
GFR calc non Af Amer: 85 mL/min/{1.73_m2} (ref >59)
GFR, EST AFRICAN AMERICAN: 99 mL/min/{1.73_m2} (ref >59)
Globulin, Total: 3 g/dL (ref 1.5–4.5)
Glucose: 91 mg/dL (ref 65–99)
POTASSIUM: 4.4 mmol/L (ref 3.5–5.2)
Sodium: 140 mmol/L (ref 134–144)
TOTAL PROTEIN: 7.4 g/dL (ref 6.0–8.5)

## 2015-10-07 LAB — HEPATITIS C ANTIBODY: HEP C VIRUS AB: 0.1 {s_co_ratio} (ref 0.0–0.9)

## 2015-10-08 ENCOUNTER — Ambulatory Visit: Payer: Self-pay | Admitting: Nurse Practitioner

## 2015-10-19 DIAGNOSIS — M25519 Pain in unspecified shoulder: Secondary | ICD-10-CM | POA: Diagnosis not present

## 2015-10-19 DIAGNOSIS — G2581 Restless legs syndrome: Secondary | ICD-10-CM | POA: Diagnosis not present

## 2015-10-19 DIAGNOSIS — G8929 Other chronic pain: Secondary | ICD-10-CM | POA: Diagnosis not present

## 2015-10-19 DIAGNOSIS — Z79899 Other long term (current) drug therapy: Secondary | ICD-10-CM | POA: Diagnosis not present

## 2015-10-19 DIAGNOSIS — F5109 Other insomnia not due to a substance or known physiological condition: Secondary | ICD-10-CM | POA: Diagnosis not present

## 2015-10-19 DIAGNOSIS — E119 Type 2 diabetes mellitus without complications: Secondary | ICD-10-CM | POA: Diagnosis not present

## 2015-10-19 DIAGNOSIS — M5412 Radiculopathy, cervical region: Secondary | ICD-10-CM | POA: Diagnosis not present

## 2015-10-20 ENCOUNTER — Encounter: Payer: PPO | Admitting: Pharmacist

## 2015-10-20 ENCOUNTER — Ambulatory Visit (INDEPENDENT_AMBULATORY_CARE_PROVIDER_SITE_OTHER): Payer: PPO | Admitting: Pharmacist

## 2015-10-20 DIAGNOSIS — I480 Paroxysmal atrial fibrillation: Secondary | ICD-10-CM | POA: Diagnosis not present

## 2015-10-20 LAB — POCT INR: INR: 2.4

## 2015-10-20 NOTE — Patient Instructions (Signed)
Anticoagulation Dose Instructions as of 10/20/2015      Andre Malone Tue Wed Thu Fri Sat   New Dose 5 mg 5 mg 5 mg 5 mg 5 mg 5 mg 5 mg    Description        Continue current warfarin dose of  - Take 1 tablet daily      INR was 2.4 today

## 2015-11-24 ENCOUNTER — Encounter: Payer: Self-pay | Admitting: Pharmacist

## 2015-11-25 ENCOUNTER — Encounter: Payer: Self-pay | Admitting: Pharmacist

## 2015-11-26 ENCOUNTER — Ambulatory Visit (INDEPENDENT_AMBULATORY_CARE_PROVIDER_SITE_OTHER): Payer: PPO | Admitting: Pharmacist

## 2015-11-26 DIAGNOSIS — I480 Paroxysmal atrial fibrillation: Secondary | ICD-10-CM | POA: Diagnosis not present

## 2015-11-26 LAB — COAGUCHEK XS/INR WAIVED
INR: 2.5 — ABNORMAL HIGH (ref 0.9–1.1)
PROTHROMBIN TIME: 30.4 s

## 2015-11-26 NOTE — Patient Instructions (Signed)
Anticoagulation Dose Instructions as of 11/26/2015      Glynis SmilesSun Mon Tue Wed Thu Fri Sat   New Dose 5 mg 5 mg 5 mg 5 mg 5 mg 5 mg 5 mg    Description        Continue current warfarin dose of 5mg  - Take 1 tablet daily      INR was 2.5 today

## 2015-12-05 ENCOUNTER — Other Ambulatory Visit: Payer: Self-pay | Admitting: Nurse Practitioner

## 2015-12-06 ENCOUNTER — Telehealth: Payer: Self-pay | Admitting: Nurse Practitioner

## 2015-12-06 NOTE — Telephone Encounter (Signed)
Last TSH 06/2014.

## 2015-12-06 NOTE — Telephone Encounter (Signed)
Patients wife aware that he needs to be seen.

## 2015-12-06 NOTE — Telephone Encounter (Signed)
Last refill without being seen 

## 2015-12-09 DIAGNOSIS — Z79899 Other long term (current) drug therapy: Secondary | ICD-10-CM | POA: Diagnosis not present

## 2015-12-09 DIAGNOSIS — E119 Type 2 diabetes mellitus without complications: Secondary | ICD-10-CM | POA: Diagnosis not present

## 2015-12-09 DIAGNOSIS — M5412 Radiculopathy, cervical region: Secondary | ICD-10-CM | POA: Diagnosis not present

## 2015-12-09 DIAGNOSIS — M25519 Pain in unspecified shoulder: Secondary | ICD-10-CM | POA: Diagnosis not present

## 2015-12-09 DIAGNOSIS — F5109 Other insomnia not due to a substance or known physiological condition: Secondary | ICD-10-CM | POA: Diagnosis not present

## 2015-12-09 DIAGNOSIS — E0590 Thyrotoxicosis, unspecified without thyrotoxic crisis or storm: Secondary | ICD-10-CM | POA: Diagnosis not present

## 2015-12-09 DIAGNOSIS — I10 Essential (primary) hypertension: Secondary | ICD-10-CM | POA: Diagnosis not present

## 2015-12-09 DIAGNOSIS — G8929 Other chronic pain: Secondary | ICD-10-CM | POA: Diagnosis not present

## 2015-12-09 DIAGNOSIS — G2581 Restless legs syndrome: Secondary | ICD-10-CM | POA: Diagnosis not present

## 2015-12-10 ENCOUNTER — Ambulatory Visit: Payer: PPO | Admitting: Nurse Practitioner

## 2015-12-10 NOTE — Telephone Encounter (Signed)
noted 

## 2015-12-21 ENCOUNTER — Ambulatory Visit (INDEPENDENT_AMBULATORY_CARE_PROVIDER_SITE_OTHER): Payer: PPO | Admitting: Nurse Practitioner

## 2015-12-21 ENCOUNTER — Encounter: Payer: Self-pay | Admitting: Nurse Practitioner

## 2015-12-21 VITALS — BP 140/84 | HR 60 | Temp 97.5°F | Ht 66.0 in | Wt 227.0 lb

## 2015-12-21 DIAGNOSIS — Z794 Long term (current) use of insulin: Secondary | ICD-10-CM | POA: Diagnosis not present

## 2015-12-21 DIAGNOSIS — E559 Vitamin D deficiency, unspecified: Secondary | ICD-10-CM

## 2015-12-21 DIAGNOSIS — E1140 Type 2 diabetes mellitus with diabetic neuropathy, unspecified: Secondary | ICD-10-CM | POA: Diagnosis not present

## 2015-12-21 MED ORDER — "INSULIN SYRINGE-NEEDLE U-100 30G X 1/2"" 1 ML MISC": Status: DC

## 2015-12-21 NOTE — Progress Notes (Signed)
   Subjective:    Patient ID: Danae Chenhomas G Jarvis, male    DOB: 23-Jan-1951, 65 y.o.   MRN: [id number]  HPI Patient in today for follow up and it was to early so he wants to discuss several things: - he needs some insulin syringes- has been getting from [facility] but they only have very large needles. -Was on vitamin D 50,000u weekly- has ran out and wanted to know what to do.    Review of Systems  Constitutional: Negative.   HENT: Negative.   Respiratory: Negative.   Cardiovascular: Negative.   Genitourinary: Negative.   Neurological: Negative.   Psychiatric/Behavioral: Negative.   All other systems reviewed and are negative.      Objective:   Physical Exam  Constitutional: He is oriented to person, place, and time. He appears well-developed and well-nourished.  Cardiovascular: Normal rate, regular rhythm and normal heart sounds.   Pulmonary/Chest: Effort normal and breath sounds normal.  Neurological: He is alert and oriented to person, place, and time. He has normal reflexes.  Skin: Skin is warm.  Psychiatric: He has a normal mood and affect. His behavior is normal. Judgment and thought content normal.    BP 140/84 mmHg  Pulse 60  Temp(Src) 97.5 F (36.4 C) (Oral)  Ht 5\' 6"  (1.676 m)  Wt 227 lb (102.967 kg)  BMI 36.66 kg/m2        Assessment & Plan:   1. Type 2 diabetes mellitus with diabetic neuropathy, with long-term current use of insulin (HCC) Continue to watch diet - Insulin Syringe-Needle U-100 (SAFETY INSULIN SYRINGES) 30G X 1/2" 1 ML MISC; Use syringes daily for insulin injections  Dispense: 300 each; Refill: 3  2. Vitamin D deficiency Vitamin D 2000iu OTC daily Will check labs in 1 month  [name], FNP

## 2015-12-23 ENCOUNTER — Ambulatory Visit (INDEPENDENT_AMBULATORY_CARE_PROVIDER_SITE_OTHER): Payer: PPO | Admitting: *Deleted

## 2015-12-23 DIAGNOSIS — I442 Atrioventricular block, complete: Secondary | ICD-10-CM

## 2015-12-23 NOTE — Progress Notes (Signed)
Remote pacemaker transmission.   

## 2016-01-05 ENCOUNTER — Ambulatory Visit: Payer: PPO | Admitting: Family Medicine

## 2016-01-05 ENCOUNTER — Ambulatory Visit (INDEPENDENT_AMBULATORY_CARE_PROVIDER_SITE_OTHER): Payer: PPO | Admitting: Family Medicine

## 2016-01-05 ENCOUNTER — Encounter: Payer: Self-pay | Admitting: Family Medicine

## 2016-01-05 ENCOUNTER — Ambulatory Visit (INDEPENDENT_AMBULATORY_CARE_PROVIDER_SITE_OTHER): Payer: PPO

## 2016-01-05 VITALS — BP 175/79 | HR 67 | Temp 97.3°F | Ht 66.0 in | Wt 227.4 lb

## 2016-01-05 DIAGNOSIS — M25562 Pain in left knee: Secondary | ICD-10-CM

## 2016-01-05 DIAGNOSIS — M222X2 Patellofemoral disorders, left knee: Secondary | ICD-10-CM | POA: Diagnosis not present

## 2016-01-05 MED ORDER — DICLOFENAC SODIUM 75 MG PO TBEC: 75.0000 mg | DELAYED_RELEASE_TABLET | Freq: Two times a day (BID) | ORAL | Status: DC

## 2016-01-05 NOTE — Progress Notes (Signed)
Subjective:  Patient ID: Andre Malone, male    DOB: Jan 29, 1951  Age: 65 y.o. MRN: [id number]  CC: Knee Pain   HPI Andre Malone G Wessling presents for left knee pain for 1 week. Pain at lateral joint line and at calf moderately severe. Worse with bending. Having to use a cane for stability.    History Andre Malone has a past medical history of Type 2 diabetes mellitus (HCC); CAD (coronary artery disease); Complete heart block (HCC); Hypertension; Hypothyroidism; Depression; Anxiety; Thoracic aortic aneurysm (HCC); Arthritis; Blindness of one eye; Restless leg; Chronic kidney disease; Hyperlipidemia; Obesity; Bell's palsy; Bicuspid aortic valve; Chronic anticoagulation; and echocardiogram.   He has past surgical history that includes Aortic valve replacement; Aortic root replacement; Right elbow surgery; Tonsillectomy; Subxiphoid window; Pacemaker insertion (05/17/2009; 09/17/2013); Total hip arthroplasty (02/27/2012); Cardiac catheterization; left heart catheterization with coronary angiogram (N/A, 05/30/2013); Lead revision (N/A, 09/17/2013); and Cardiac valve replacement.   His family history includes Alcohol abuse in his father; Healthy in his daughter and son; Heart attack in his brother; Heart disease in his father and paternal grandfather; Other in his brother and mother; Stroke in his mother.He reports that he has never smoked. He has never used smokeless tobacco. He reports that he does not drink alcohol or use illicit drugs.    ROS Review of Systems  Constitutional: Negative for fever, chills and diaphoresis.  HENT: Negative for rhinorrhea and sore throat.   Respiratory: Negative for cough and shortness of breath.   Cardiovascular: Negative for chest pain.  Gastrointestinal: Negative for abdominal pain.  Musculoskeletal: Negative for myalgias and arthralgias.  Skin: Negative for rash.  Neurological: Negative for weakness and headaches.    Objective:  BP 175/79 mmHg  Pulse 67   Temp(Src) 97.3 F (36.3 C) (Oral)  Ht 5\' 6"  (1.676 m)  Wt 227 lb 6.4 oz (103.148 kg)  BMI 36.72 kg/m2  SpO2 95%  BP Readings from Last 3 Encounters:  01/05/16 175/79  12/21/15 140/84  10/06/15 137/80    Wt Readings from Last 3 Encounters:  01/05/16 227 lb 6.4 oz (103.148 kg)  12/21/15 227 lb (102.967 kg)  09/23/15 220 lb 12.8 oz (100.154 kg)     Physical Exam  Constitutional: He is oriented to person, place, and time. He appears well-developed and well-nourished.  HENT:  Head: Normocephalic and atraumatic.  Right Ear: External ear normal.  Left Ear: External ear normal.  Mouth/Throat: No oropharyngeal exudate or posterior oropharyngeal erythema.  Eyes: Pupils are equal, round, and reactive to light.  Neck: Normal range of motion. Neck supple.  Cardiovascular: Normal rate and regular rhythm.   No murmur heard. Pulmonary/Chest: Breath sounds normal. No respiratory distress.  Musculoskeletal: He exhibits tenderness.  Theleft knee has full range of motion without discomfort actively and passively. Gait is wtth a limp. The joint lines are tender laterally. The patella is palpable without tenderness or edema.  Lachman / anterior drawer signs are negative for signs of instability and pain free. McMurray testing reveals no pop or excessive discomfort. Varus and valgus stree maneuvers do not cause ligamentous stretch or instability. There is tenderness at medial joint  Neurological: He is alert and oriented to person, place, and time.  Vitals reviewed.    Lab Results  Component Value Date   WBC 21.1* 09/29/2014   HGB 13.1 09/29/2014   HCT 38.8* 09/29/2014   PLT 190 09/29/2014   GLUCOSE 91 10/06/2015   CHOL 203* 10/06/2015   TRIG 168* 10/06/2015   HDL  42 10/06/2015   LDLCALC 127* 10/06/2015   ALT 13 10/06/2015   AST 23 10/06/2015   NA 140 10/06/2015   K 4.4 10/06/2015   CL 102 10/06/2015   CREATININE 0.94 10/06/2015   BUN 12 10/06/2015   CO2 21 10/06/2015   TSH 3.420  06/05/2014   PSA 0.6 11/19/2013   INR 2.5* 11/26/2015   HGBA1C 5.8 10/06/2015    Ct Abdomen Pelvis W Contrast  09/29/2014  CLINICAL DATA:  Dysuria. Incontinence of urine. Sensation of having to urinate but cannot. EXAM: CT ABDOMEN AND PELVIS WITH CONTRAST TECHNIQUE: Multidetector CT imaging of the abdomen and pelvis was performed using the standard protocol following bolus administration of intravenous contrast. CONTRAST:  OMNIPAQUE IOHEXOL 300 MG/ML  SOLN COMPARISON:  07/24/2013 FINDINGS: Mild dependent changes in the lung bases. Postoperative changes in the heart and lower mediastinum. Diffuse fatty infiltration of the liver. The gallbladder, spleen, pancreas, adrenal glands, kidneys, inferior vena cava, and retroperitoneal lymph nodes are unremarkable. Calcification of the abdominal aorta without aneurysm. Small accessory spleen. Stomach, small bowel, and colon appear normal for degree of distention. No free air or free fluid in the abdomen. Pelvis: Bladder is decompressed but there is suggestion of possible bladder wall thickening. Consider infection. Appendix is normal. Diverticula in the sigmoid colon without evidence of diverticulitis. Prostate gland is diffusely enlarged. No free or loculated pelvic fluid collections. No pelvic mass or lymphadenopathy. Left hip arthroplasty with streak artifact obscuring visualization of some portions of the pelvis. Mild degenerative changes in the spine. No destructive bone lesions appreciated. IMPRESSION: Bladder is decompressed but suggest wall thickening. Consider infection. Prostate gland is enlarged. Diffuse fatty infiltration of the liver. No hydronephrosis or hydroureter. Electronically Signed   By: Burman Nieves M.D.   On: 09/29/2014 02:08    Assessment & Plan:   Andre Malone was seen today for knee pain.  Diagnoses and all orders for this visit:  Left knee pain -     DG Knee 1-2 Views Left -     Ambulatory referral to Physical  Therapy  Patellofemoral stress syndrome of left knee -     Ambulatory referral to Physical Therapy  Other orders -     diclofenac (VOLTAREN) 75 MG EC tablet; Take 1 tablet (75 mg total) by mouth 2 (two) times daily.    I am having Mr. Irion start on diclofenac. I am also having him maintain his sertraline, insulin glargine, tamsulosin, aspirin, traMADol, pramipexole, methocarbamol, oxyCODONE, warfarin, pravastatin, traZODone, metFORMIN, levothyroxine, and Insulin Syringe-Needle U-100.  Meds ordered this encounter  Medications  . diclofenac (VOLTAREN) 75 MG EC tablet    Sig: Take 1 tablet (75 mg total) by mouth 2 (two) times daily.    Dispense:  60 tablet    Refill:  2     Follow-up: Return in about 6 weeks (around 02/16/2016) for Pain.  Mechele Claude, M.D.

## 2016-01-05 NOTE — Addendum Note (Signed)
Addended by: Bearl MulberryUTHERFORD, NATALIE K on: 01/05/2016 05:39 PM   Modules accepted: Orders, SmartSet

## 2016-01-06 DIAGNOSIS — M228X2 Other disorders of patella, left knee: Secondary | ICD-10-CM | POA: Diagnosis not present

## 2016-01-10 ENCOUNTER — Ambulatory Visit (INDEPENDENT_AMBULATORY_CARE_PROVIDER_SITE_OTHER): Payer: PPO | Admitting: Pharmacist

## 2016-01-10 VITALS — BP 150/87 | HR 86

## 2016-01-10 DIAGNOSIS — I480 Paroxysmal atrial fibrillation: Secondary | ICD-10-CM

## 2016-01-10 LAB — COAGUCHEK XS/INR WAIVED
INR: 2.7 — ABNORMAL HIGH (ref 0.9–1.1)
Prothrombin Time: 32.7 s

## 2016-01-10 NOTE — Patient Instructions (Signed)
Anticoagulation Dose Instructions as of 01/10/2016      Andre Malone Mon Tue Wed Thu Fri Sat   New Dose 5 mg 5 mg 5 mg 5 mg 5 mg 5 mg 5 mg    Description        Continue current warfarin dose of 5mg  - Take 1 tablet daily      INR was 2.7 today

## 2016-01-20 ENCOUNTER — Telehealth: Payer: Self-pay | Admitting: Pharmacist

## 2016-01-20 ENCOUNTER — Other Ambulatory Visit (INDEPENDENT_AMBULATORY_CARE_PROVIDER_SITE_OTHER): Payer: PPO

## 2016-01-20 DIAGNOSIS — Z0000 Encounter for general adult medical examination without abnormal findings: Secondary | ICD-10-CM | POA: Diagnosis not present

## 2016-01-20 DIAGNOSIS — I480 Paroxysmal atrial fibrillation: Secondary | ICD-10-CM

## 2016-01-20 DIAGNOSIS — R310 Gross hematuria: Secondary | ICD-10-CM | POA: Diagnosis not present

## 2016-01-20 LAB — COAGUCHEK XS/INR WAIVED
INR: 4.6 — AB (ref 0.9–1.1)
Prothrombin Time: 55.6 s

## 2016-01-20 NOTE — Telephone Encounter (Signed)
Patient reports that he started a new medication recently and he has noticed that his urine looks cloudy and a little brownish in color.  He has appt in about an hour with his urologist.   It appears he was started on diclofenac which can increase INR.  I recommended he keep appt with urologist and to stop by here when he can today to have INR checked - could need warfarin adjustment since starting diclofenac.

## 2016-01-20 NOTE — Patient Instructions (Signed)
Anticoagulation Dose Instructions as of 01/20/2016      Andre SmilesSun Mon Tue Wed Thu Fri Sat   New Dose 5 mg 5 mg 5 mg 5 mg 5 mg 5 mg 5 mg    Description        No warfarin today or tomorrow.  Continue current warfarin dose of 5mg  - Take 1 tablet daily.  Stop diclofenac.      INR was 4.6 today

## 2016-01-28 ENCOUNTER — Encounter: Payer: Self-pay | Admitting: Cardiology

## 2016-01-28 LAB — CUP PACEART REMOTE DEVICE CHECK
Battery Remaining Longevity: 103 mo
Battery Voltage: 2.99 V
Brady Statistic AS VP Percent: 82 %
Brady Statistic RA Percent Paced: 17 %
Implantable Lead Implant Date: 20100913
Implantable Lead Location: 753860
Lead Channel Sensing Intrinsic Amplitude: 3.8 mV
Lead Channel Setting Pacing Amplitude: 2 V
Lead Channel Setting Pacing Amplitude: 2.5 V
MDC IDC LEAD IMPLANT DT: 20150114
MDC IDC LEAD LOCATION: 753859
MDC IDC MSMT BATTERY REMAINING PERCENTAGE: 95.5 %
MDC IDC MSMT LEADCHNL RA IMPEDANCE VALUE: 240 Ohm
MDC IDC MSMT LEADCHNL RV IMPEDANCE VALUE: 580 Ohm
MDC IDC MSMT LEADCHNL RV SENSING INTR AMPL: 9.1 mV
MDC IDC SESS DTM: 20170420095117
MDC IDC SET LEADCHNL RV PACING PULSEWIDTH: 0.5 ms
MDC IDC SET LEADCHNL RV SENSING SENSITIVITY: 4 mV
MDC IDC STAT BRADY AP VP PERCENT: 17 %
MDC IDC STAT BRADY AP VS PERCENT: 1 %
MDC IDC STAT BRADY AS VS PERCENT: 1 %
MDC IDC STAT BRADY RV PERCENT PACED: 99 %
Pulse Gen Serial Number: 7586540

## 2016-02-03 ENCOUNTER — Ambulatory Visit: Payer: PPO | Admitting: Nurse Practitioner

## 2016-02-04 ENCOUNTER — Ambulatory Visit (INDEPENDENT_AMBULATORY_CARE_PROVIDER_SITE_OTHER): Payer: PPO | Admitting: Nurse Practitioner

## 2016-02-04 ENCOUNTER — Encounter: Payer: Self-pay | Admitting: Nurse Practitioner

## 2016-02-04 VITALS — BP 132/79 | HR 84 | Temp 97.3°F | Ht 66.0 in | Wt 227.2 lb

## 2016-02-04 DIAGNOSIS — E559 Vitamin D deficiency, unspecified: Secondary | ICD-10-CM | POA: Diagnosis not present

## 2016-02-04 DIAGNOSIS — W57XXXA Bitten or stung by nonvenomous insect and other nonvenomous arthropods, initial encounter: Secondary | ICD-10-CM

## 2016-02-04 DIAGNOSIS — N183 Chronic kidney disease, stage 3 unspecified: Secondary | ICD-10-CM

## 2016-02-04 DIAGNOSIS — I480 Paroxysmal atrial fibrillation: Secondary | ICD-10-CM | POA: Diagnosis not present

## 2016-02-04 DIAGNOSIS — T148 Other injury of unspecified body region: Secondary | ICD-10-CM

## 2016-02-04 DIAGNOSIS — E039 Hypothyroidism, unspecified: Secondary | ICD-10-CM | POA: Diagnosis not present

## 2016-02-04 DIAGNOSIS — E1140 Type 2 diabetes mellitus with diabetic neuropathy, unspecified: Secondary | ICD-10-CM

## 2016-02-04 DIAGNOSIS — I10 Essential (primary) hypertension: Secondary | ICD-10-CM | POA: Diagnosis not present

## 2016-02-04 DIAGNOSIS — E663 Overweight: Secondary | ICD-10-CM

## 2016-02-04 DIAGNOSIS — G2581 Restless legs syndrome: Secondary | ICD-10-CM | POA: Diagnosis not present

## 2016-02-04 DIAGNOSIS — Z794 Long term (current) use of insulin: Secondary | ICD-10-CM

## 2016-02-04 DIAGNOSIS — E785 Hyperlipidemia, unspecified: Secondary | ICD-10-CM

## 2016-02-04 DIAGNOSIS — I2510 Atherosclerotic heart disease of native coronary artery without angina pectoris: Secondary | ICD-10-CM | POA: Diagnosis not present

## 2016-02-04 DIAGNOSIS — I25119 Atherosclerotic heart disease of native coronary artery with unspecified angina pectoris: Secondary | ICD-10-CM

## 2016-02-04 LAB — COAGUCHEK XS/INR WAIVED
INR: 3.2 — AB (ref 0.9–1.1)
PROTHROMBIN TIME: 38.3 s

## 2016-02-04 LAB — BAYER DCA HB A1C WAIVED: HB A1C: 6.4 % (ref <7.0)

## 2016-02-04 NOTE — Progress Notes (Signed)
Subjective:    Patient ID: Andre Malone, male    DOB: 1951-08-04, 65 y.o.   MRN: [id number]  Patient here today for follow up of chronic medical problems.  Outpatient Encounter Prescriptions as of 02/04/2016  Medication Sig  . aspirin EC 81 MG EC tablet Take 1 tablet (81 mg total) by mouth daily.  . insulin glargine (LANTUS) 100 UNIT/ML injection Inject 40 Units into the skin daily after supper.   . Insulin Syringe-Needle U-100 (SAFETY INSULIN SYRINGES) 30G X 1/2" 1 ML MISC Use syringes daily for insulin injections  . levothyroxine (SYNTHROID, LEVOTHROID) 88 MCG tablet Take 1 tablet (88 mcg total) by mouth daily.  . metFORMIN (GLUCOPHAGE) 1000 MG tablet TAKE 1 TABLET (1,000 MG TOTAL) BY MOUTH 2 (TWO) TIMES DAILY WITH A MEAL.  . methocarbamol (ROBAXIN) 500 MG tablet Take 1 tablet (500 mg total) by mouth every 6 (six) hours as needed for muscle spasms.  Marland Kitchen oxyCODONE (ROXICODONE) 15 MG immediate release tablet Take 15 mg by mouth every 6 (six) hours as needed.  . pramipexole (MIRAPEX) 0.5 MG tablet Take 1 mg by mouth 2 (two) times daily.   . pravastatin (PRAVACHOL) 40 MG tablet TAKE 1 TABLET (40 MG TOTAL) BY MOUTH EVERY EVENING. FOR CHOLESTEROL  . sertraline (ZOLOFT) 100 MG tablet Take 100 mg by mouth daily.    . Tamsulosin HCl (FLOMAX) 0.4 MG CAPS Take 0.4 mg by mouth daily as needed. Reported on 01/05/2016  . traMADol (ULTRAM) 50 MG tablet Take 50 mg by mouth every 12 (twelve) hours as needed for moderate pain.   . traZODone (DESYREL) 100 MG tablet TAKE ONE TABLET BY MOUTH AT BEDTIME  . warfarin (COUMADIN) 5 MG tablet TAKE 1 &1/2 BY MOUTH EVERY DAY ON MONDAY AND THURDAY AND 1 TABLET ON ALL OTHER DAYS   No facility-administered encounter medications on file as of 02/04/2016.     Diabetes He presents for his follow-up diabetic visit. He has type 2 diabetes mellitus. His disease course has been stable. Pertinent negatives for hypoglycemia include no dizziness. Pertinent negatives for diabetes  include no chest pain, no fatigue, no polydipsia, no polyphagia, no polyuria, no visual change and no weakness. Diabetic complications include heart disease and peripheral neuropathy. Pertinent negatives for diabetic complications include no CVA or retinopathy. Risk factors for coronary artery disease include diabetes mellitus, dyslipidemia, hypertension, male sex and obesity. Current diabetic treatment includes insulin injections and oral agent (monotherapy). He is compliant with treatment most of the time. His weight is stable. He has not had a previous visit with a dietitian. He rarely participates in exercise. His breakfast blood glucose is taken between 8-9 am. His breakfast blood glucose range is generally 110-130 mg/dl. His overall blood glucose range is 110-130 mg/dl. An ACE inhibitor/angiotensin II receptor blocker is not being taken. He does not see a podiatrist.Eye exam is current.  Hyperlipidemia This is a chronic problem. The current episode started more than 1 year ago. The problem is controlled. Recent lipid tests were reviewed and are normal. Exacerbating diseases include diabetes and obesity. He has no history of hypothyroidism. Pertinent negatives include no chest pain, myalgias or shortness of breath. Current antihyperlipidemic treatment includes statins. The current treatment provides moderate improvement of lipids. Compliance problems include adherence to diet and adherence to exercise.  Risk factors for coronary artery disease include dyslipidemia, diabetes mellitus, hypertension, male sex and obesity.  Hypertension This is a chronic problem. The current episode started more than 1 year ago. The problem  is controlled. Pertinent negatives include no chest pain, palpitations or shortness of breath. Past treatments include calcium channel blockers. The current treatment provides moderate improvement. Compliance problems include diet and exercise.  Hypertensive end-organ damage includes CAD/MI,  heart failure and a thyroid problem. There is no history of CVA or retinopathy.  Thyroid Problem Presents for follow-up visit. Patient reports no constipation, diaphoresis, diarrhea, fatigue, palpitations or visual change. The symptoms have been stable. His past medical history is significant for diabetes, heart failure and hyperlipidemia.  Benign Prostatic Hypertrophy This is a chronic problem. The problem has been waxing and waning since onset. Irritative symptoms do not include urgency. Pertinent negatives include no hematuria.  depression Currently on zoloft- Doing well -no C/O side effects RLS Mirapex and tramadol - helps a lot with symptoms insomnia trazadone to sleep everynight- feels rested in AM Aortic Valve replacement/ pacemaker/CAD/  On coumadin- Denies bleeding Chronic kidney disease GFR improving  * C/O multiple tick bites- feels ok but would like to have some testing done to make sure he is okay  Review of Systems  Constitutional: Negative for diaphoresis and fatigue.  Respiratory: Negative for cough and shortness of breath.   Cardiovascular: Negative for chest pain and palpitations.  Gastrointestinal: Negative for diarrhea, constipation and blood in stool.  Endocrine: Negative for polydipsia, polyphagia and polyuria.  Genitourinary: Negative for urgency and hematuria.  Musculoskeletal: Negative for myalgias.  Neurological: Negative for dizziness and weakness.  Psychiatric/Behavioral: Negative for sleep disturbance.  All other systems reviewed and are negative.      Objective:   Physical Exam  Constitutional: He is oriented to person, place, and time. He appears well-developed and well-nourished.  HENT:  Head: Normocephalic.  Right Ear: External ear normal.  Left Ear: External ear normal.  Nose: Nose normal.  Mouth/Throat: Oropharynx is clear and moist.  Eyes: EOM are normal. Pupils are equal, round, and reactive to light.  Neck: Normal range of motion. Neck  supple. No thyromegaly present.  Cardiovascular: Normal rate, regular rhythm, normal heart sounds and intact distal pulses.   No murmur heard. Audible aortic valve click  Pulmonary/Chest: Effort normal and breath sounds normal. He has no wheezes. He has no rales.  Abdominal: Soft. Bowel sounds are normal.  Musculoskeletal: Normal range of motion. He exhibits edema (1+ edema bil lower ext).  Neurological: He is alert and oriented to person, place, and time.  Skin: Skin is warm and dry.  Psychiatric: He has a normal mood and affect. His behavior is normal. Judgment and thought content normal.    BP 132/79 mmHg  Pulse 84  Temp(Src) 97.3 F (36.3 C) (Oral)  Ht '5\' 6"'  (1.676 m)  Wt 227 lb 3.2 oz (103.057 kg)  BMI 36.69 kg/m2  HGBA1c 6.4%      Assessment & Plan:   1. Essential hypertension Do not add salt  to diet - CMP14+EGFR - Lipid panel - Bayer DCA Hb A1c Waived  2. Type 2 diabetes mellitus with diabetic neuropathy, with long-term current use of insulin (HCC) Continue to watch carbs in diet - CMP14+EGFR - Lipid panel - Bayer DCA Hb A1c Waived  3. Chronic kidney disease, stage 3 (moderate) - CMP14+EGFR - Lipid panel - Bayer DCA Hb A1c Waived  4. Tick bite Labs pending - CMP14+EGFR - Lipid panel - [place] mtn spotted fvr abs pnl(IgG+IgM) - Lyme Ab/Western Blot Reflex - Bayer DCA Hb A1c Waived  5. Paroxysmal atrial fibrillation ([place]) Keep follow up with cardiology - CoaguChek XS/INR Waived  6. Atherosclerosis of native coronary artery of native heart without angina pectoris  7. Hypothyroidism, unspecified hypothyroidism type   8. Hyperlipidemia with target LDL less than 100 Low fat diet  9. Overweight Discussed diet and exercise for person with BMI >25 Will recheck weight in 3-6 months  10. Atherosclerosis of native coronary artery of native heart with angina pectoris ([place])  11. RLS (restless legs syndrome) 12. Vitamin D deficiency - VITAMIN D 25 Hydroxy  (Vit-D Deficiency, Fractures)  See INR documentation  Labs pending Health maintenance reviewed Diet and exercise encouraged Continue all meds Follow up  In 3 months   [place], FNP

## 2016-02-04 NOTE — Patient Instructions (Signed)

## 2016-02-07 DIAGNOSIS — I10 Essential (primary) hypertension: Secondary | ICD-10-CM | POA: Diagnosis not present

## 2016-02-07 DIAGNOSIS — G8929 Other chronic pain: Secondary | ICD-10-CM | POA: Diagnosis not present

## 2016-02-07 DIAGNOSIS — G2581 Restless legs syndrome: Secondary | ICD-10-CM | POA: Diagnosis not present

## 2016-02-07 DIAGNOSIS — Z79899 Other long term (current) drug therapy: Secondary | ICD-10-CM | POA: Diagnosis not present

## 2016-02-07 DIAGNOSIS — F5109 Other insomnia not due to a substance or known physiological condition: Secondary | ICD-10-CM | POA: Diagnosis not present

## 2016-02-07 DIAGNOSIS — E119 Type 2 diabetes mellitus without complications: Secondary | ICD-10-CM | POA: Diagnosis not present

## 2016-02-07 DIAGNOSIS — M5412 Radiculopathy, cervical region: Secondary | ICD-10-CM | POA: Diagnosis not present

## 2016-02-07 DIAGNOSIS — M25562 Pain in left knee: Secondary | ICD-10-CM | POA: Diagnosis not present

## 2016-02-07 DIAGNOSIS — M25519 Pain in unspecified shoulder: Secondary | ICD-10-CM | POA: Diagnosis not present

## 2016-02-08 LAB — CMP14+EGFR
A/G RATIO: 1.5 (ref 1.2–2.2)
ALK PHOS: 81 IU/L (ref 39–117)
ALT: 15 IU/L (ref 0–44)
AST: 21 IU/L (ref 0–40)
Albumin: 4.4 g/dL (ref 3.6–4.8)
BILIRUBIN TOTAL: 0.3 mg/dL (ref 0.0–1.2)
BUN / CREAT RATIO: 11 (ref 10–24)
BUN: 11 mg/dL (ref 8–27)
CHLORIDE: 101 mmol/L (ref 96–106)
CO2: 20 mmol/L (ref 18–29)
Calcium: 9.7 mg/dL (ref 8.6–10.2)
Creatinine, Ser: 0.98 mg/dL (ref 0.76–1.27)
GFR calc non Af Amer: 81 mL/min/{1.73_m2} (ref >59)
GFR, EST AFRICAN AMERICAN: 94 mL/min/{1.73_m2} (ref >59)
Globulin, Total: 3 g/dL (ref 1.5–4.5)
Glucose: 139 mg/dL — ABNORMAL HIGH (ref 65–99)
POTASSIUM: 4.6 mmol/L (ref 3.5–5.2)
Sodium: 138 mmol/L (ref 134–144)
TOTAL PROTEIN: 7.4 g/dL (ref 6.0–8.5)

## 2016-02-08 LAB — ROCKY MTN SPOTTED FVR ABS PNL(IGG+IGM)
RMSF IGG: NEGATIVE
RMSF IgM: 0.24 index (ref 0.00–0.89)

## 2016-02-08 LAB — LIPID PANEL
CHOLESTEROL TOTAL: 195 mg/dL (ref 100–199)
Chol/HDL Ratio: 5.3 ratio units — ABNORMAL HIGH (ref 0.0–5.0)
HDL: 37 mg/dL — AB (ref >39)
LDL Calculated: 105 mg/dL — ABNORMAL HIGH (ref 0–99)
Triglycerides: 266 mg/dL — ABNORMAL HIGH (ref 0–149)
VLDL Cholesterol Cal: 53 mg/dL — ABNORMAL HIGH (ref 5–40)

## 2016-02-08 LAB — VITAMIN D 25 HYDROXY (VIT D DEFICIENCY, FRACTURES): VIT D 25 HYDROXY: 30.2 ng/mL (ref 30.0–100.0)

## 2016-02-08 LAB — LYME AB/WESTERN BLOT REFLEX: Lyme IgG/IgM Ab: <0.91 {ISR} (ref 0.00–0.90)

## 2016-02-14 ENCOUNTER — Telehealth: Payer: Self-pay | Admitting: Nurse Practitioner

## 2016-02-14 MED ORDER — NAFTIFINE HCL 1 % EX CREA: TOPICAL_CREAM | Freq: Every day | CUTANEOUS | Status: DC

## 2016-02-14 NOTE — Telephone Encounter (Signed)
Patient aware that medication has been sent to pharmacy 

## 2016-02-14 NOTE — Telephone Encounter (Signed)
Prescription sent to pharmacy.

## 2016-02-15 ENCOUNTER — Ambulatory Visit: Payer: PPO | Admitting: Physician Assistant

## 2016-03-04 ENCOUNTER — Other Ambulatory Visit: Payer: Self-pay | Admitting: Nurse Practitioner

## 2016-03-06 NOTE — Telephone Encounter (Signed)
Last TSH 06/05/14

## 2016-03-16 ENCOUNTER — Telehealth: Payer: Self-pay | Admitting: Pharmacist

## 2016-03-16 ENCOUNTER — Ambulatory Visit (INDEPENDENT_AMBULATORY_CARE_PROVIDER_SITE_OTHER): Payer: PPO | Admitting: Pharmacist

## 2016-03-16 DIAGNOSIS — I480 Paroxysmal atrial fibrillation: Secondary | ICD-10-CM

## 2016-03-16 LAB — COAGUCHEK XS/INR WAIVED
INR: 4.1 — ABNORMAL HIGH (ref 0.9–1.1)
Prothrombin Time: 48.9 s

## 2016-03-16 MED ORDER — CLINDAMYCIN HCL 300 MG PO CAPS: 300.0000 mg | ORAL_CAPSULE | Freq: Four times a day (QID) | ORAL | Status: AC

## 2016-03-16 NOTE — Progress Notes (Signed)
Subjective:     Indication: atrial fibrillation Bleeding signs/symptoms: None Thromboembolic signs/symptoms: None  Missed Coumadin doses: None Medication changes: no Dietary changes: no Bacterial/viral infection: patient asked for ABX for dental infections.  Discussed with his PCP and Ok' d clindamycin for 7 days Other concerns: no  Objective:    INR Today: 4.1 Current dose: warfarin 5mg  - 1 tablet daily     Assessment:    Supratherapeutic INR for goal of 2-3   Plan:    1. New dose: no warfarin for 2 days - then decrease warfarin to 2.5mg  mondays and fridays, 5mg  all other days   2. Next INR: 2 weeks   3.  Per PCP clindamycin 300mg  qid for 7 days

## 2016-03-16 NOTE — Patient Instructions (Signed)
Anticoagulation Dose Instructions as of 03/16/2016      Andre SmilesSun Mon Tue Wed Thu Fri Sat   New Dose 5 mg 2.5 mg 5 mg 5 mg 5 mg 2.5 mg 5 mg    Description        No warfarin today (03/16/16) or tomorrow (03/17/2016). Then decrease dose to warfarin 5mg  take 1/2 tablet mondays and Fridays.  Take 1 tablet all other days.      INR was 4.1 today

## 2016-03-16 NOTE — Telephone Encounter (Signed)
Error

## 2016-03-17 ENCOUNTER — Telehealth: Payer: Self-pay | Admitting: Nurse Practitioner

## 2016-03-17 NOTE — Telephone Encounter (Signed)
Discussed with his PCP Andre PieriniMary Margaret Martin, NP.  Really wants him to continue clindamycin if possible.  Patient advised to take after he eats meal.   He will try again.   Call back if continues to have problems.

## 2016-03-23 ENCOUNTER — Ambulatory Visit (INDEPENDENT_AMBULATORY_CARE_PROVIDER_SITE_OTHER): Payer: PPO | Admitting: *Deleted

## 2016-03-23 DIAGNOSIS — I442 Atrioventricular block, complete: Secondary | ICD-10-CM | POA: Diagnosis not present

## 2016-03-23 NOTE — Progress Notes (Signed)
Remote pacemaker transmission.   

## 2016-03-24 ENCOUNTER — Encounter: Payer: Self-pay | Admitting: Cardiology

## 2016-03-24 LAB — CUP PACEART REMOTE DEVICE CHECK
Battery Remaining Percentage: 95.5 %
Battery Voltage: 2.99 V
Brady Statistic AP VS Percent: 1 %
Brady Statistic AS VP Percent: 80 %
Brady Statistic AS VS Percent: 1 %
Brady Statistic RA Percent Paced: 19 %
Brady Statistic RV Percent Paced: 99 %
Implantable Lead Implant Date: 20100913
Implantable Lead Location: 753860
Lead Channel Sensing Intrinsic Amplitude: 2.5 mV
Lead Channel Setting Pacing Amplitude: 2.5 V
MDC IDC LEAD IMPLANT DT: 20150114
MDC IDC LEAD LOCATION: 753859
MDC IDC MSMT BATTERY REMAINING LONGEVITY: 103 mo
MDC IDC MSMT LEADCHNL RA IMPEDANCE VALUE: 260 Ohm
MDC IDC MSMT LEADCHNL RV IMPEDANCE VALUE: 550 Ohm
MDC IDC SESS DTM: 20170720060013
MDC IDC SET LEADCHNL RA PACING AMPLITUDE: 2 V
MDC IDC SET LEADCHNL RV PACING PULSEWIDTH: 0.5 ms
MDC IDC SET LEADCHNL RV SENSING SENSITIVITY: 4 mV
MDC IDC STAT BRADY AP VP PERCENT: 19 %
Pulse Gen Serial Number: 7586540

## 2016-03-28 ENCOUNTER — Ambulatory Visit (INDEPENDENT_AMBULATORY_CARE_PROVIDER_SITE_OTHER): Payer: PPO | Admitting: Pharmacist

## 2016-03-28 DIAGNOSIS — I480 Paroxysmal atrial fibrillation: Secondary | ICD-10-CM | POA: Diagnosis not present

## 2016-03-28 LAB — COAGUCHEK XS/INR WAIVED
INR: 2.2 — AB (ref 0.9–1.1)
Prothrombin Time: 26.8 s

## 2016-04-06 DIAGNOSIS — F5109 Other insomnia not due to a substance or known physiological condition: Secondary | ICD-10-CM | POA: Diagnosis not present

## 2016-04-06 DIAGNOSIS — Z79899 Other long term (current) drug therapy: Secondary | ICD-10-CM | POA: Diagnosis not present

## 2016-04-06 DIAGNOSIS — M25519 Pain in unspecified shoulder: Secondary | ICD-10-CM | POA: Diagnosis not present

## 2016-04-06 DIAGNOSIS — G8929 Other chronic pain: Secondary | ICD-10-CM | POA: Diagnosis not present

## 2016-04-06 DIAGNOSIS — Z79891 Long term (current) use of opiate analgesic: Secondary | ICD-10-CM | POA: Diagnosis not present

## 2016-04-06 DIAGNOSIS — G2581 Restless legs syndrome: Secondary | ICD-10-CM | POA: Diagnosis not present

## 2016-04-06 DIAGNOSIS — E119 Type 2 diabetes mellitus without complications: Secondary | ICD-10-CM | POA: Diagnosis not present

## 2016-04-06 DIAGNOSIS — I10 Essential (primary) hypertension: Secondary | ICD-10-CM | POA: Diagnosis not present

## 2016-04-06 DIAGNOSIS — M5412 Radiculopathy, cervical region: Secondary | ICD-10-CM | POA: Diagnosis not present

## 2016-04-06 DIAGNOSIS — E0590 Thyrotoxicosis, unspecified without thyrotoxic crisis or storm: Secondary | ICD-10-CM | POA: Diagnosis not present

## 2016-04-07 ENCOUNTER — Other Ambulatory Visit: Payer: Self-pay | Admitting: Nurse Practitioner

## 2016-04-07 DIAGNOSIS — E785 Hyperlipidemia, unspecified: Secondary | ICD-10-CM

## 2016-04-07 DIAGNOSIS — I359 Nonrheumatic aortic valve disorder, unspecified: Secondary | ICD-10-CM

## 2016-04-20 ENCOUNTER — Other Ambulatory Visit: Payer: Self-pay | Admitting: Family Medicine

## 2016-05-01 ENCOUNTER — Encounter: Payer: Self-pay | Admitting: Pharmacist

## 2016-05-02 ENCOUNTER — Encounter: Payer: Self-pay | Admitting: Nurse Practitioner

## 2016-05-02 LAB — HM DIABETES EYE EXAM

## 2016-05-05 DIAGNOSIS — G2581 Restless legs syndrome: Secondary | ICD-10-CM | POA: Diagnosis not present

## 2016-05-05 DIAGNOSIS — E119 Type 2 diabetes mellitus without complications: Secondary | ICD-10-CM | POA: Diagnosis not present

## 2016-05-05 DIAGNOSIS — M25519 Pain in unspecified shoulder: Secondary | ICD-10-CM | POA: Diagnosis not present

## 2016-05-05 DIAGNOSIS — Z79891 Long term (current) use of opiate analgesic: Secondary | ICD-10-CM | POA: Diagnosis not present

## 2016-05-05 DIAGNOSIS — M5412 Radiculopathy, cervical region: Secondary | ICD-10-CM | POA: Diagnosis not present

## 2016-05-05 DIAGNOSIS — E0590 Thyrotoxicosis, unspecified without thyrotoxic crisis or storm: Secondary | ICD-10-CM | POA: Diagnosis not present

## 2016-05-05 DIAGNOSIS — I10 Essential (primary) hypertension: Secondary | ICD-10-CM | POA: Diagnosis not present

## 2016-05-05 DIAGNOSIS — G8929 Other chronic pain: Secondary | ICD-10-CM | POA: Diagnosis not present

## 2016-05-05 DIAGNOSIS — F5109 Other insomnia not due to a substance or known physiological condition: Secondary | ICD-10-CM | POA: Diagnosis not present

## 2016-05-09 LAB — HM DIABETES EYE EXAM

## 2016-05-18 ENCOUNTER — Ambulatory Visit: Payer: PPO | Admitting: Nurse Practitioner

## 2016-05-19 ENCOUNTER — Encounter: Payer: Self-pay | Admitting: Nurse Practitioner

## 2016-05-26 ENCOUNTER — Ambulatory Visit (INDEPENDENT_AMBULATORY_CARE_PROVIDER_SITE_OTHER): Payer: PPO | Admitting: Nurse Practitioner

## 2016-05-26 ENCOUNTER — Encounter: Payer: Self-pay | Admitting: Nurse Practitioner

## 2016-05-26 VITALS — BP 137/79 | HR 60 | Temp 97.1°F | Ht 66.0 in | Wt 233.0 lb

## 2016-05-26 DIAGNOSIS — I25119 Atherosclerotic heart disease of native coronary artery with unspecified angina pectoris: Secondary | ICD-10-CM | POA: Diagnosis not present

## 2016-05-26 DIAGNOSIS — I480 Paroxysmal atrial fibrillation: Secondary | ICD-10-CM

## 2016-05-26 DIAGNOSIS — E039 Hypothyroidism, unspecified: Secondary | ICD-10-CM | POA: Diagnosis not present

## 2016-05-26 DIAGNOSIS — E785 Hyperlipidemia, unspecified: Secondary | ICD-10-CM | POA: Diagnosis not present

## 2016-05-26 DIAGNOSIS — N183 Chronic kidney disease, stage 3 unspecified: Secondary | ICD-10-CM

## 2016-05-26 DIAGNOSIS — I214 Non-ST elevation (NSTEMI) myocardial infarction: Secondary | ICD-10-CM

## 2016-05-26 DIAGNOSIS — Z952 Presence of prosthetic heart valve: Secondary | ICD-10-CM

## 2016-05-26 DIAGNOSIS — E119 Type 2 diabetes mellitus without complications: Secondary | ICD-10-CM

## 2016-05-26 DIAGNOSIS — Z954 Presence of other heart-valve replacement: Secondary | ICD-10-CM

## 2016-05-26 DIAGNOSIS — Z23 Encounter for immunization: Secondary | ICD-10-CM

## 2016-05-26 DIAGNOSIS — G2581 Restless legs syndrome: Secondary | ICD-10-CM

## 2016-05-26 DIAGNOSIS — E1140 Type 2 diabetes mellitus with diabetic neuropathy, unspecified: Secondary | ICD-10-CM | POA: Diagnosis not present

## 2016-05-26 DIAGNOSIS — I10 Essential (primary) hypertension: Secondary | ICD-10-CM

## 2016-05-26 DIAGNOSIS — Z950 Presence of cardiac pacemaker: Secondary | ICD-10-CM

## 2016-05-26 DIAGNOSIS — E663 Overweight: Secondary | ICD-10-CM

## 2016-05-26 DIAGNOSIS — Z794 Long term (current) use of insulin: Secondary | ICD-10-CM

## 2016-05-26 LAB — BAYER DCA HB A1C WAIVED: HB A1C (BAYER DCA - WAIVED): 6.1 % (ref <7.0)

## 2016-05-26 LAB — COAGUCHEK XS/INR WAIVED
INR: 1.7 — ABNORMAL HIGH (ref 0.9–1.1)
PROTHROMBIN TIME: 20.5 s

## 2016-05-26 MED ORDER — PRAVASTATIN SODIUM 40 MG PO TABS: 40.0000 mg | ORAL_TABLET | Freq: Every day | ORAL | 1 refills | Status: DC

## 2016-05-26 MED ORDER — INSULIN GLARGINE 100 UNIT/ML ~~LOC~~ SOLN: 40.0000 [IU] | Freq: Every day | SUBCUTANEOUS | 1 refills | Status: DC

## 2016-05-26 MED ORDER — LEVOTHYROXINE SODIUM 88 MCG PO TABS: 88.0000 ug | ORAL_TABLET | Freq: Every day | ORAL | 1 refills | Status: DC

## 2016-05-26 MED ORDER — METFORMIN HCL 1000 MG PO TABS: ORAL_TABLET | ORAL | 1 refills | Status: DC

## 2016-05-26 MED ORDER — SERTRALINE HCL 100 MG PO TABS: 100.0000 mg | ORAL_TABLET | Freq: Every day | ORAL | 1 refills | Status: DC

## 2016-05-26 MED ORDER — TRAZODONE HCL 100 MG PO TABS: ORAL_TABLET | ORAL | 1 refills | Status: DC

## 2016-05-26 MED ORDER — TAMSULOSIN HCL 0.4 MG PO CAPS: 0.4000 mg | ORAL_CAPSULE | Freq: Every day | ORAL | 1 refills | Status: DC | PRN

## 2016-05-26 MED ORDER — PRAMIPEXOLE DIHYDROCHLORIDE 0.5 MG PO TABS: 1.0000 mg | ORAL_TABLET | Freq: Two times a day (BID) | ORAL | 1 refills | Status: DC

## 2016-05-26 NOTE — Progress Notes (Signed)
Subjective:    Patient ID: Andre Malone, male    DOB: [birth year], 65 y.o.   MRN: [id number]  Patient here today for follow up of chronic medical problems.  Outpatient Encounter Prescriptions as of 05/26/2016  Medication Sig  . aspirin EC 81 MG EC tablet Take 1 tablet (81 mg total) by mouth daily.  . insulin glargine (LANTUS) 100 UNIT/ML injection Inject 40 Units into the skin daily after supper.   . levothyroxine (SYNTHROID, LEVOTHROID) 88 MCG tablet TAKE ONE TABLET BY MOUTH ONCE DAILY  . metFORMIN (GLUCOPHAGE) 1000 MG tablet TAKE 1 TABLET (1,000 MG TOTAL) BY MOUTH 2 (TWO) TIMES DAILY WITH A MEAL.  . methocarbamol (ROBAXIN) 500 MG tablet Take 1 tablet (500 mg total) by mouth every 6 (six) hours as needed for muscle spasms.  . naftifine (NAFTIN) 1 % cream Apply topically daily.  Marland Kitchen oxyCODONE (ROXICODONE) 15 MG immediate release tablet Take 15 mg by mouth every 6 (six) hours as needed.  . pramipexole (MIRAPEX) 0.5 MG tablet Take 1 mg by mouth 2 (two) times daily.   . pravastatin (PRAVACHOL) 40 MG tablet TAKE 1 TABLET (40 MG TOTAL) BY MOUTH EVERY EVENING. FOR CHOLESTEROL  . sertraline (ZOLOFT) 100 MG tablet Take 100 mg by mouth daily.    . Tamsulosin HCl (FLOMAX) 0.4 MG CAPS Take 0.4 mg by mouth daily as needed. Reported on 01/05/2016  . traMADol (ULTRAM) 50 MG tablet Take 50 mg by mouth every 12 (twelve) hours as needed for moderate pain.   . traZODone (DESYREL) 100 MG tablet TAKE ONE TABLET BY MOUTH AT BEDTIME  . warfarin (COUMADIN) 5 MG tablet TAKE 1 &1/2 BY MOUTH EVERY DAY ON MONDAY AND THURDAY AND 1 TABLET ON ALL OTHER DAYS    Diabetes  He presents for his follow-up diabetic visit. He has type 2 diabetes mellitus. His disease course has been stable. Pertinent negatives for hypoglycemia include no dizziness. Pertinent negatives for diabetes include no chest pain, no fatigue, no polydipsia, no polyphagia, no polyuria, no visual change and no weakness. Diabetic complications include heart  disease and peripheral neuropathy. Pertinent negatives for diabetic complications include no CVA or retinopathy. Risk factors for coronary artery disease include diabetes mellitus, dyslipidemia, hypertension, male sex and obesity. Current diabetic treatment includes insulin injections and oral agent (monotherapy). He is compliant with treatment most of the time. His weight is stable. He has not had a previous visit with a dietitian. He rarely participates in exercise. His breakfast blood glucose is taken between 8-9 am. His breakfast blood glucose range is generally 110-130 mg/dl. His overall blood glucose range is 110-130 mg/dl. An ACE inhibitor/angiotensin II receptor blocker is not being taken. He does not see a podiatrist.Eye exam is current.  Hyperlipidemia  This is a chronic problem. The current episode started more than 1 year ago. The problem is controlled. Recent lipid tests were reviewed and are normal. Exacerbating diseases include diabetes and obesity. He has no history of hypothyroidism. Pertinent negatives include no chest pain, myalgias or shortness of breath. Current antihyperlipidemic treatment includes statins. The current treatment provides moderate improvement of lipids. Compliance problems include adherence to diet and adherence to exercise.  Risk factors for coronary artery disease include dyslipidemia, diabetes mellitus, hypertension, male sex and obesity.  Hypertension  This is a chronic problem. The current episode started more than 1 year ago. The problem is controlled. Pertinent negatives include no chest pain, palpitations or shortness of breath. Past treatments include calcium channel blockers. The current  treatment provides moderate improvement. Compliance problems include diet and exercise.  Hypertensive end-organ damage includes CAD/MI, heart failure and a thyroid problem. There is no history of CVA or retinopathy.  Thyroid Problem  Presents for follow-up visit. Patient reports  no constipation, diaphoresis, diarrhea, fatigue, palpitations or visual change. The symptoms have been stable. His past medical history is significant for diabetes, heart failure and hyperlipidemia.  Benign Prostatic Hypertrophy  This is a chronic problem. The problem has been waxing and waning since onset. Irritative symptoms do not include urgency. Pertinent negatives include no hematuria.  depression Currently on zoloft- Doing well -no C/O side effects RLS Mirapex and tramadol - helps a lot with symptoms insomnia trazadone to sleep everynight- feels rested in AM Aortic Valve replacement/ pacemaker/CAD/ hx MI On coumadin- Denies bleeding- see INR documentation Chronic kidney disease GFR improving   Review of Systems  Constitutional: Negative for diaphoresis and fatigue.  Respiratory: Negative for cough and shortness of breath.   Cardiovascular: Negative for chest pain and palpitations.  Gastrointestinal: Negative for blood in stool, constipation and diarrhea.  Endocrine: Negative for polydipsia, polyphagia and polyuria.  Genitourinary: Negative for hematuria and urgency.  Musculoskeletal: Negative for myalgias.  Neurological: Negative for dizziness and weakness.  Psychiatric/Behavioral: Negative for sleep disturbance.  All other systems reviewed and are negative.      Objective:   Physical Exam  Constitutional: He is oriented to person, place, and time. He appears well-developed and well-nourished.  HENT:  Head: Normocephalic.  Right Ear: External ear normal.  Left Ear: External ear normal.  Nose: Nose normal.  Mouth/Throat: Oropharynx is clear and moist.  Eyes: EOM are normal. Pupils are equal, round, and reactive to light.  Neck: Normal range of motion. Neck supple. No thyromegaly present.  Cardiovascular: Normal rate, regular rhythm, normal heart sounds and intact distal pulses.   No murmur heard. Audible aortic valve click  Pulmonary/Chest: Effort normal and breath  sounds normal. He has no wheezes. He has no rales.  Abdominal: Soft. Bowel sounds are normal.  Musculoskeletal: Normal range of motion. He exhibits edema (1+ edema bil lower ext).  Neurological: He is alert and oriented to person, place, and time.  Skin: Skin is warm and dry.  Psychiatric: He has a normal mood and affect. His behavior is normal. Judgment and thought content normal.    BP 137/79   Pulse 60   Temp 97.1 F (36.2 C) (Oral)   Ht _0  (1.676 m)   Wt 233 lb (105.7 kg)   BMI 37.61 kg/m   HGBA1c 6.1%      Assessment & Plan:   1. Type 2 diabetes mellitus without complication, without long-term current use of insulin ([place])   2. Essential hypertension   3. Paroxysmal atrial fibrillation (HCC)   4. Atherosclerosis of native coronary artery of native heart with angina pectoris ([place])   5. NSTEMI (non-ST elevated myocardial infarction) ([place])   6. Hypothyroidism, unspecified hypothyroidism type   7. Type 2 diabetes mellitus with diabetic neuropathy, with long-term current use of insulin ([place])   8. Chronic kidney disease, stage 3 (moderate)   9. Hyperlipidemia with target LDL less than 100   10. Overweight   11. Pacemaker  [employer] [place]   12. History of prosthetic aortic valve   13. RLS (restless legs syndrome)    Meds ordered this encounter  Medications  . pravastatin (PRAVACHOL) 40 MG tablet    Sig: Take 1 tablet (40 mg total) by mouth daily.  Dispense:  90 tablet    Refill:  1    Order Specific Question:   Supervising Provider    Answer:   VINCENT, CAROL L [[id number]]  . insulin glargine (LANTUS) 100 UNIT/ML injection    Sig: Inject 0.4 mLs (40 Units total) into the skin daily after supper.    Dispense:  10 mL    Refill:  1    Order Specific Question:   Supervising Provider    Answer:   VINCENT, CAROL L [[id number]]  . sertraline (ZOLOFT) 100 MG tablet    Sig: Take 1 tablet (100 mg total) by mouth daily.    Dispense:  90 tablet    Refill:  1    Order Specific Question:    Supervising Provider    Answer:   VINCENT, CAROL L [[id number]]  . tamsulosin (FLOMAX) 0.4 MG CAPS capsule    Sig: Take 1 capsule (0.4 mg total) by mouth daily as needed. Reported on 01/05/2016    Dispense:  90 capsule    Refill:  1    Order Specific Question:   Supervising Provider    Answer:   VINCENT, CAROL L [[id number]]  . pramipexole (MIRAPEX) 0.5 MG tablet    Sig: Take 2 tablets (1 mg total) by mouth 2 (two) times daily.    Dispense:  180 tablet    Refill:  1    Order Specific Question:   Supervising Provider    Answer:   VINCENT, CAROL L [[id number]]  . metFORMIN (GLUCOPHAGE) 1000 MG tablet    Sig: TAKE 1 TABLET (1,000 MG TOTAL) BY MOUTH 2 (TWO) TIMES DAILY WITH A MEAL.    Dispense:  180 tablet    Refill:  1    Order Specific Question:   Supervising Provider    Answer:   VINCENT, CAROL L [[id number]]  . traZODone (DESYREL) 100 MG tablet    Sig: TAKE ONE TABLET BY MOUTH AT BEDTIME    Dispense:  90 tablet    Refill:  1    Order Specific Question:   Supervising Provider    Answer:   VINCENT, CAROL L [[id number]]  . levothyroxine (SYNTHROID, LEVOTHROID) 88 MCG tablet    Sig: Take 1 tablet (88 mcg total) by mouth daily.    Dispense:  90 tablet    Refill:  1    Order Specific Question:   Supervising Provider    Answer:   Evette Doffing, CAROL L [[id number]]   Orders Placed This Encounter  Procedures  . Bayer DCA Hb A1c Waived  . CMP14+EGFR   Labs pending Low fta diet and exeercise Recheck INR in 2 weeks Chronic follow up in 3 momths  [name] Hassell Done, FNP

## 2016-05-26 NOTE — Patient Instructions (Signed)
Bone Health Bones protect organs, store calcium, and anchor muscles. Good health habits, such as eating nutritious foods and exercising regularly, are important for maintaining healthy bones. They can also help to prevent a condition that causes bones to lose density and become weak and brittle (osteoporosis). WHY IS BONE MASS IMPORTANT? Bone mass refers to the amount of bone tissue that you have. The higher your bone mass, the stronger your bones. An important step toward having healthy bones throughout life is to have strong and dense bones during childhood. A young adult who has a high bone mass is more likely to have a high bone mass later in life. Bone mass at its greatest it is called peak bone mass. A large decline in bone mass occurs in older adults. In women, it occurs about the time of menopause. During this time, it is important to practice good health habits, because if more bone is lost than what is replaced, the bones will become less healthy and more likely to break (fracture). If you find that you have a low bone mass, you may be able to prevent osteoporosis or further bone loss by changing your diet and lifestyle. HOW CAN I FIND OUT IF MY BONE MASS IS LOW? Bone mass can be measured with an X-ray test that is called a bone mineral density (BMD) test. This test is recommended for all women who are age 65 or older. It may also be recommended for men who are age 70 or older, or for people who are more likely to develop osteoporosis due to:  Having bones that break easily.  Having a long-term disease that weakens bones, such as kidney disease or rheumatoid arthritis.  Having menopause earlier than normal.  Taking medicine that weakens bones, such as steroids, thyroid hormones, or hormone treatment for breast cancer or prostate cancer.  Smoking.  Drinking three or more alcoholic drinks each day. WHAT ARE THE NUTRITIONAL RECOMMENDATIONS FOR HEALTHY BONES? To have healthy bones, you need  to get enough of the right minerals and vitamins. Most nutrition experts recommend getting these nutrients from the foods that you eat. Nutritional recommendations vary from person to person. Ask your health care provider what is healthy for you. Here are some general guidelines. Calcium Recommendations Calcium is the most important (essential) mineral for bone health. Most people can get enough calcium from their diet, but supplements may be recommended for people who are at risk for osteoporosis. Good sources of calcium include:  Dairy products, such as low-fat or nonfat milk, cheese, and yogurt.  Dark green leafy vegetables, such as bok choy and broccoli.  Calcium-fortified foods, such as orange juice, cereal, bread, soy beverages, and tofu products.  Nuts, such as almonds. Follow these recommended amounts for daily calcium intake:  Children, age 1-3: 700 mg.  Children, age 4-8: 1,000 mg.  Children, age 9-13: 1,300 mg.  Teens, age 14-18: 1,300 mg.  Adults, age 19-50: 1,000 mg.  Adults, age 51-70:  Men: 1,000 mg.  Women: 1,200 mg.  Adults, age 71 or older: 1,200 mg.  Pregnant and breastfeeding females:  Teens: 1,300 mg.  Adults: 1,000 mg. Vitamin D Recommendations Vitamin D is the most essential vitamin for bone health. It helps the body to absorb calcium. Sunlight stimulates the skin to make vitamin D, so be sure to get enough sunlight. If you live in a cold climate or you do not get outside often, your health care provider may recommend that you take vitamin D supplements. Good   sources of vitamin D in your diet include:  Egg yolks.  Saltwater fish.  Milk and cereal fortified with vitamin D. Follow these recommended amounts for daily vitamin D intake:  Children and teens, age 1-18: 600 international units.  Adults, age 50 or younger: 400-800 international units.  Adults, age 51 or older: 800-1,000 international units. Other Nutrients Other nutrients for bone  health include:  Phosphorus. This mineral is found in meat, poultry, dairy foods, nuts, and legumes. The recommended daily intake for adult men and adult women is 700 mg.  Magnesium. This mineral is found in seeds, nuts, dark green vegetables, and legumes. The recommended daily intake for adult men is 400-420 mg. For adult women, it is 310-320 mg.  Vitamin K. This vitamin is found in green leafy vegetables. The recommended daily intake is 120 mg for adult men and 90 mg for adult women. WHAT TYPE OF PHYSICAL ACTIVITY IS BEST FOR BUILDING AND MAINTAINING HEALTHY BONES? Weight-bearing and strength-building activities are important for building and maintaining peak bone mass. Weight-bearing activities cause muscles and bones to work against gravity. Strength-building activities increases muscle strength that supports bones. Weight-bearing and muscle-building activities include:  Walking and hiking.  Jogging and running.  Dancing.  Gym exercises.  Lifting weights.  Tennis and racquetball.  Climbing stairs.  Aerobics. Adults should get at least 30 minutes of moderate physical activity on most days. Children should get at least 60 minutes of moderate physical activity on most days. Ask your health care provide what type of exercise is best for you. WHERE CAN I FIND MORE INFORMATION? For more information, check out the following websites:  National Osteoporosis Foundation: http://nof.org/learn/basics  National Institutes of Health: http://www.niams.nih.gov/Health_Info/Bone/Bone_Health/bone_health_for_life.asp   This information is not intended to replace advice given to you by your health care provider. Make sure you discuss any questions you have with your health care provider.   Document Released: 11/11/2003 Document Revised: 01/05/2015 Document Reviewed: 08/26/2014 Elsevier Interactive Patient Education 2016 Elsevier Inc.  

## 2016-05-27 LAB — CMP14+EGFR
A/G RATIO: 1.5 (ref 1.2–2.2)
ALBUMIN: 4.5 g/dL (ref 3.6–4.8)
ALT: 17 IU/L (ref 0–44)
AST: 25 IU/L (ref 0–40)
Alkaline Phosphatase: 77 IU/L (ref 39–117)
BUN / CREAT RATIO: 18 (ref 10–24)
BUN: 15 mg/dL (ref 8–27)
Bilirubin Total: 0.3 mg/dL (ref 0.0–1.2)
CALCIUM: 9.5 mg/dL (ref 8.6–10.2)
CO2: 23 mmol/L (ref 18–29)
CREATININE: 0.85 mg/dL (ref 0.76–1.27)
Chloride: 98 mmol/L (ref 96–106)
GFR, EST AFRICAN AMERICAN: 106 mL/min/{1.73_m2} (ref >59)
GFR, EST NON AFRICAN AMERICAN: 92 mL/min/{1.73_m2} (ref >59)
GLOBULIN, TOTAL: 3.1 g/dL (ref 1.5–4.5)
Glucose: 111 mg/dL — ABNORMAL HIGH (ref 65–99)
Potassium: 4.4 mmol/L (ref 3.5–5.2)
SODIUM: 136 mmol/L (ref 134–144)
TOTAL PROTEIN: 7.6 g/dL (ref 6.0–8.5)

## 2016-05-31 ENCOUNTER — Telehealth: Payer: Self-pay | Admitting: Nurse Practitioner

## 2016-05-31 NOTE — Telephone Encounter (Signed)
Tried to contact patient, no answer and no vm. 

## 2016-05-31 NOTE — Telephone Encounter (Signed)
Patient insisted on talking to MMM about medications and would not give me any more information. Aware you are out of the office today and will be back in tomorrow. Please advise

## 2016-06-01 NOTE — Telephone Encounter (Signed)
Patient just wanted  To let me know that [facility]- told just do  Not pick meds up from pharmacy and let them know that he does not need them

## 2016-06-03 ENCOUNTER — Other Ambulatory Visit: Payer: Self-pay | Admitting: Nurse Practitioner

## 2016-06-05 ENCOUNTER — Ambulatory Visit (INDEPENDENT_AMBULATORY_CARE_PROVIDER_SITE_OTHER): Payer: PPO

## 2016-06-05 ENCOUNTER — Encounter: Payer: Self-pay | Admitting: Nurse Practitioner

## 2016-06-05 ENCOUNTER — Ambulatory Visit (INDEPENDENT_AMBULATORY_CARE_PROVIDER_SITE_OTHER): Payer: PPO | Admitting: Nurse Practitioner

## 2016-06-05 VITALS — BP 140/67 | HR 65 | Temp 96.7°F | Ht 66.0 in | Wt 231.0 lb

## 2016-06-05 DIAGNOSIS — S9031XA Contusion of right foot, initial encounter: Secondary | ICD-10-CM

## 2016-06-05 DIAGNOSIS — M25571 Pain in right ankle and joints of right foot: Secondary | ICD-10-CM | POA: Diagnosis not present

## 2016-06-05 NOTE — Progress Notes (Signed)
   Subjective:    Patient ID: Andre Malone, male    DOB: [birth year], 65 y.o.   MRN: [id number]  HPI Patient in today saying that he dropped a paint can on his foot. He did not be sen for it. It is not any better. This past Saturday it rreaaly hurt to walk or stand. Resting makes pain better.    Review of Systems  Constitutional: Negative.   Respiratory: Negative.   Cardiovascular: Negative.   Gastrointestinal: Negative.   Musculoskeletal:       Right foot pain  Neurological: Negative.   Psychiatric/Behavioral: Negative.   All other systems reviewed and are negative.      Objective:   Physical Exam  Constitutional: He is oriented to person, place, and time. He appears well-developed and well-nourished. No distress.  Cardiovascular: Normal rate and normal heart sounds.   Musculoskeletal:  Pain at base of right 4th toe- no contusion- no erythema no edema Pain with flexion of toes.  Neurological: He is alert and oriented to person, place, and time.  Skin: Skin is warm.  Psychiatric: He has a normal mood and affect. His behavior is normal. Judgment and thought content normal.   BP 140/67   Pulse 65   Temp (!) 96.7 F (35.9 C) (Oral)   Ht 5\' 6"  (1.676 m)   Wt 231 lb (104.8 kg)   BMI 37.28 kg/m   Right foot xray- no visible fracture-Preliminary reading by Paulene FloorMary Dewaun Kinzler, FNP  [facility]       Assessment & Plan:   1. Pain in joint involving right ankle and foot   2. Contusion of right foot, initial encounter    Ice Elevate Motrin or tylenol OTC RTO prn  [name], FNP

## 2016-06-05 NOTE — Patient Instructions (Signed)

## 2016-06-15 ENCOUNTER — Ambulatory Visit (INDEPENDENT_AMBULATORY_CARE_PROVIDER_SITE_OTHER): Payer: PPO | Admitting: Pharmacist

## 2016-06-15 DIAGNOSIS — I480 Paroxysmal atrial fibrillation: Secondary | ICD-10-CM

## 2016-06-15 LAB — COAGUCHEK XS/INR WAIVED
INR: 3 — AB (ref 0.9–1.1)
PROTHROMBIN TIME: 36.2 s

## 2016-06-22 ENCOUNTER — Ambulatory Visit (INDEPENDENT_AMBULATORY_CARE_PROVIDER_SITE_OTHER): Payer: PPO | Admitting: *Deleted

## 2016-06-22 DIAGNOSIS — I442 Atrioventricular block, complete: Secondary | ICD-10-CM | POA: Diagnosis not present

## 2016-06-22 NOTE — Progress Notes (Signed)
Remote pacemaker transmission.   

## 2016-06-23 ENCOUNTER — Encounter: Payer: Self-pay | Admitting: Cardiology

## 2016-07-03 DIAGNOSIS — I10 Essential (primary) hypertension: Secondary | ICD-10-CM | POA: Diagnosis not present

## 2016-07-03 DIAGNOSIS — M25519 Pain in unspecified shoulder: Secondary | ICD-10-CM | POA: Diagnosis not present

## 2016-07-03 DIAGNOSIS — E0590 Thyrotoxicosis, unspecified without thyrotoxic crisis or storm: Secondary | ICD-10-CM | POA: Diagnosis not present

## 2016-07-03 DIAGNOSIS — F5109 Other insomnia not due to a substance or known physiological condition: Secondary | ICD-10-CM | POA: Diagnosis not present

## 2016-07-03 DIAGNOSIS — E119 Type 2 diabetes mellitus without complications: Secondary | ICD-10-CM | POA: Diagnosis not present

## 2016-07-03 DIAGNOSIS — G8929 Other chronic pain: Secondary | ICD-10-CM | POA: Diagnosis not present

## 2016-07-03 DIAGNOSIS — M5412 Radiculopathy, cervical region: Secondary | ICD-10-CM | POA: Diagnosis not present

## 2016-07-03 DIAGNOSIS — G2581 Restless legs syndrome: Secondary | ICD-10-CM | POA: Diagnosis not present

## 2016-07-03 DIAGNOSIS — Z79891 Long term (current) use of opiate analgesic: Secondary | ICD-10-CM | POA: Diagnosis not present

## 2016-07-07 ENCOUNTER — Encounter: Payer: Self-pay | Admitting: Cardiology

## 2016-07-07 ENCOUNTER — Other Ambulatory Visit: Payer: Self-pay | Admitting: Nurse Practitioner

## 2016-07-07 DIAGNOSIS — E785 Hyperlipidemia, unspecified: Secondary | ICD-10-CM

## 2016-07-20 LAB — CUP PACEART REMOTE DEVICE CHECK
Battery Remaining Longevity: 103 mo
Battery Remaining Percentage: 95.5 %
Battery Voltage: 2.99 V
Brady Statistic AP VP Percent: 24 %
Brady Statistic AP VS Percent: 1 %
Brady Statistic AS VP Percent: 75 %
Brady Statistic AS VS Percent: 1 %
Brady Statistic RA Percent Paced: 24 %
Brady Statistic RV Percent Paced: 99 %
Date Time Interrogation Session: 20171019071252
Implantable Lead Implant Date: 20100913
Implantable Lead Implant Date: 20150114
Implantable Lead Location: 753859
Implantable Lead Location: 753860
Implantable Pulse Generator Implant Date: 20150114
Lead Channel Impedance Value: 260 Ohm
Lead Channel Impedance Value: 590 Ohm
Lead Channel Pacing Threshold Amplitude: 0.75 V
Lead Channel Pacing Threshold Amplitude: 1 V
Lead Channel Pacing Threshold Pulse Width: 0.5 ms
Lead Channel Pacing Threshold Pulse Width: 0.5 ms
Lead Channel Sensing Intrinsic Amplitude: 11.3 mV
Lead Channel Sensing Intrinsic Amplitude: 3.5 mV
Lead Channel Setting Pacing Amplitude: 2 V
Lead Channel Setting Pacing Amplitude: 2.5 V
Lead Channel Setting Pacing Pulse Width: 0.5 ms
Lead Channel Setting Sensing Sensitivity: 4 mV
Pulse Gen Model: 2240
Pulse Gen Serial Number: 7586540

## 2016-08-01 ENCOUNTER — Ambulatory Visit (INDEPENDENT_AMBULATORY_CARE_PROVIDER_SITE_OTHER): Payer: PPO | Admitting: Pharmacist

## 2016-08-01 DIAGNOSIS — Z952 Presence of prosthetic heart valve: Secondary | ICD-10-CM

## 2016-08-01 DIAGNOSIS — I480 Paroxysmal atrial fibrillation: Secondary | ICD-10-CM | POA: Diagnosis not present

## 2016-08-01 LAB — COAGUCHEK XS/INR WAIVED
INR: 4 — ABNORMAL HIGH (ref 0.9–1.1)
Prothrombin Time: 47.7 s

## 2016-08-01 NOTE — Progress Notes (Signed)
Patient ID: Andre Malone, male   DOB: 1951/05/06, 65 y.o.   MRN: [id number]   Subjective:     Indication: atrial fibrillation and mechanical AVR Bleeding signs/symptoms: None Thromboembolic signs/symptoms: None  Missed Coumadin doses: None Medication changes: yes - started Lyrica about 2 months ago Dietary changes: yes - pt reports eating more green leafy vegetables recently and increased EtOH on the weekends. Bacterial/viral infection: no Other concerns: no  Objective:    INR Today: 4.0 Current dose: warfarin 5mg  - take 1 tablet daily except on Thursdays take 1/2 tablet or 2.5mg   Assessment:    Supratherapeutic INR for goal of 2.5 to 3.0   Plan:    1. New dose: no warfarin today, then decrease warfarin dose to 2.5mg  mondays and thursdays.  Take 5mg  all other days.  2. Next INR: recommended in 2 weeks but patient will be out of town for the next several weeks to appt for 08/24/16

## 2016-08-24 ENCOUNTER — Ambulatory Visit (INDEPENDENT_AMBULATORY_CARE_PROVIDER_SITE_OTHER): Payer: PPO | Admitting: Pharmacist

## 2016-08-24 ENCOUNTER — Encounter: Payer: Self-pay | Admitting: Pharmacist

## 2016-08-24 VITALS — BP 112/78 | HR 70 | Ht 66.25 in | Wt 226.5 lb

## 2016-08-24 DIAGNOSIS — Z952 Presence of prosthetic heart valve: Secondary | ICD-10-CM

## 2016-08-24 DIAGNOSIS — Z794 Long term (current) use of insulin: Secondary | ICD-10-CM

## 2016-08-24 DIAGNOSIS — I480 Paroxysmal atrial fibrillation: Secondary | ICD-10-CM | POA: Diagnosis not present

## 2016-08-24 DIAGNOSIS — Z0000 Encounter for general adult medical examination without abnormal findings: Secondary | ICD-10-CM

## 2016-08-24 DIAGNOSIS — E119 Type 2 diabetes mellitus without complications: Secondary | ICD-10-CM | POA: Diagnosis not present

## 2016-08-24 DIAGNOSIS — Z23 Encounter for immunization: Secondary | ICD-10-CM

## 2016-08-24 LAB — COAGUCHEK XS/INR WAIVED
INR: 2.4 — ABNORMAL HIGH (ref 0.9–1.1)
PROTHROMBIN TIME: 28.8 s

## 2016-08-24 NOTE — Addendum Note (Signed)
Addended by: Bearl MulberryUTHERFORD, Annahi Short K on: 08/24/2016 04:24 PM   Modules accepted: Orders
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
## 2016-08-24 NOTE — Patient Instructions (Addendum)
Andre Malone , Thank you for taking time to come for your Medicare Wellness Visit. I appreciate your ongoing commitment to your health goals. Please review the following plan we discussed and let me know if I can assist you in the future.   These are the goals we discussed:  See if urine microalbumin was done at [facility] - bring in copy of report if you are able to get from your electronic records.   Increase non-starchy vegetables - carrots, green bean, squash, zucchini, tomatoes, onions, peppers, spinach and other green leafy vegetables, cabbage, lettuce, cucumbers, asparagus, okra (not fried), eggplant Limit sugar and processed foods (cakes, cookies, ice cream, crackers and chips) Increase fresh fruit but limit serving sizes 1/2 cup or about the size of tennis or baseball Limit red meat to no more than 1-2 times per week (serving size about the size of your palm) Choose whole grains / lean proteins - whole wheat bread, quinoa, whole grain rice (1/2 cup), fish, chicken, [place] Avoid sugar and calorie containing beverages - soda, sweet tea and juice.  Choose water or unsweetened tea instead.  Goal physical activity if 150 minutes per week.   This is a list of the screening recommended for you and due dates:  Health Maintenance  Topic Date Due  . Pneumonia vaccines (1 of 2 - PCV13) 07/23/2016  . Urine Protein Check  08/24/2016  . Shingles Vaccine  11/23/2016*  . HIV Screening  05/26/2017*  . Hemoglobin A1C  11/23/2016  . Eye exam for diabetics  05/09/2017  . Complete foot exam   05/26/2017  . Tetanus Vaccine  09/16/2021  . Flu Shot  Completed  .  Hepatitis C: One time screening is recommended by Center for Disease Control  (CDC) for  adults born from [birth year] through 1965.   Completed  *Topic was postponed. The date shown is not the original due date.   Health Maintenance, Male A healthy lifestyle and preventative care can promote health and wellness.  Maintain regular health, dental, and  eye exams.  Eat a healthy diet. Foods like vegetables, fruits, whole grains, low-fat dairy products, and lean protein foods contain the nutrients you need and are low in calories. Decrease your intake of foods high in solid fats, added sugars, and salt. Get information about a proper diet from your health care provider, if necessary.  Regular physical exercise is one of the most important things you can do for your health. Most adults should get at least 150 minutes of moderate-intensity exercise (any activity that increases your heart rate and causes you to sweat) each week. In addition, most adults need muscle-strengthening exercises on 2 or more days a week.   Maintain a healthy weight. The body mass index (BMI) is a screening tool to identify possible weight problems. It provides an estimate of body fat based on height and weight. Your health care provider can find your BMI and can help you achieve or maintain a healthy weight. For males 20 years and older:  A BMI below 18.5 is considered underweight.  A BMI of 18.5 to 24.9 is normal.  A BMI of 25 to 29.9 is considered overweight.  A BMI of 30 and above is considered obese.  Maintain normal blood lipids and cholesterol by exercising and minimizing your intake of saturated fat. Eat a balanced diet with plenty of fruits and vegetables. Blood tests for lipids and cholesterol should begin at age 42 and be repeated every 5 years. If your lipid or  cholesterol levels are high, you are over age 65, or you are at high risk for heart disease, you may need your cholesterol levels checked more frequently.Ongoing high lipid and cholesterol levels should be treated with medicines if diet and exercise are not working.  If you smoke, find out from your health care provider how to quit. If you do not use tobacco, do not start.  Lung cancer screening is recommended for adults aged 55-80 years who are at high risk for developing lung cancer because of a  history of smoking. A yearly low-dose CT scan of the lungs is recommended for people who have at least a 30-pack-year history of smoking and are current smokers or have quit within the past 15 years. A pack year of smoking is smoking an average of 1 pack of cigarettes a day for 1 year (for example, a 30-pack-year history of smoking could mean smoking 1 pack a day for 30 years or 2 packs a day for 15 years). Yearly screening should continue until the smoker has stopped smoking for at least 15 years. Yearly screening should be stopped for people who develop a health problem that would prevent them from having lung cancer treatment.  If you choose to drink alcohol, do not have more than 2 drinks per day. One drink is considered to be 12 oz (360 mL) of beer, 5 oz (150 mL) of wine, or 1.5 oz (45 mL) of liquor.  Avoid the use of street drugs. Do not share needles with anyone. Ask for help if you need support or instructions about stopping the use of drugs.  High blood pressure causes heart disease and increases the risk of stroke. High blood pressure is more likely to develop in:  People who have blood pressure in the end of the normal range (100-139/85-89 mm Hg).  People who are overweight or obese.  People who are African American.  If you are 1618-65 years of age, have your blood pressure checked every 3-5 years. If you are 65 years of age or older, have your blood pressure checked every year. You should have your blood pressure measured twice-once when you are at a hospital or clinic, and once when you are not at a hospital or clinic. Record the average of the two measurements. To check your blood pressure when you are not at a hospital or clinic, you can use:  An automated blood pressure machine at a pharmacy.  A home blood pressure monitor.  If you are 2045-65 years old, ask your health care provider if you should take aspirin to prevent heart disease.  Diabetes screening involves taking a blood  sample to check your fasting blood sugar level. This should be done once every 3 years after age 65 if you are at a normal weight and without risk factors for diabetes. Testing should be considered at a younger age or be carried out more frequently if you are overweight and have at least 1 risk factor for diabetes.  Colorectal cancer can be detected and often prevented. Most routine colorectal cancer screening begins at the age of 65 and continues through age 975. However, your health care provider may recommend screening at an earlier age if you have risk factors for colon cancer. On a yearly basis, your health care provider may provide home test kits to check for hidden blood in the stool. A small camera at the end of a tube may be used to directly examine the colon (sigmoidoscopy or colonoscopy) to  detect the earliest forms of colorectal cancer. Talk to your health care provider about this at age 65 when routine screening begins. A direct exam of the colon should be repeated every 5-10 years through age 65, unless early forms of precancerous polyps or small growths are found.  People who are at an increased risk for hepatitis B should be screened for this virus. You are considered at high risk for hepatitis B if:  You were born in a country where hepatitis B occurs often. Talk with your health care provider about which countries are considered high risk.  Your parents were born in a high-risk country and you have not received a shot to protect against hepatitis B (hepatitis B vaccine).  You have HIV or AIDS.  You use needles to inject street drugs.  You live with, or have sex with, someone who has hepatitis B.  You are a man who has sex with other men (MSM).  You get hemodialysis treatment.  You take certain medicines for conditions like cancer, organ transplantation, and autoimmune conditions.  Hepatitis C blood testing is recommended for all people born from [id number] through 1965 and any  individual with known risk factors for hepatitis C.  Healthy men should no longer receive prostate-specific antigen (PSA) blood tests as part of routine cancer screening. Talk to your health care provider about prostate cancer screening.  Testicular cancer screening is not recommended for adolescents or adult males who have no symptoms. Screening includes self-exam, a health care provider exam, and other screening tests. Consult with your health care provider about any symptoms you have or any concerns you have about testicular cancer.  Practice safe sex. Use condoms and avoid high-risk sexual practices to reduce the spread of sexually transmitted infections (STIs).  You should be screened for STIs, including gonorrhea and chlamydia if:  You are sexually active and are younger than 24 years.  You are older than 24 years, and your health care provider tells you that you are at risk for this type of infection.  Your sexual activity has changed since you were last screened, and you are at an increased risk for chlamydia or gonorrhea. Ask your health care provider if you are at risk.  If you are at risk of being infected with HIV, it is recommended that you take a prescription medicine daily to prevent HIV infection. This is called pre-exposure prophylaxis (PrEP). You are considered at risk if:  You are a man who has sex with other men (MSM).  You are a heterosexual man who is sexually active with multiple partners.  You take drugs by injection.  You are sexually active with a partner who has HIV.  Talk with your health care provider about whether you are at high risk of being infected with HIV. If you choose to begin PrEP, you should first be tested for HIV. You should then be tested every 3 months for as long as you are taking PrEP.  Use sunscreen. Apply sunscreen liberally and repeatedly throughout the day. You should seek shade when your shadow is shorter than you. Protect yourself by  wearing long sleeves, pants, a wide-brimmed hat, and sunglasses year round whenever you are outdoors.  Tell your health care provider of new moles or changes in moles, especially if there is a change in shape or color. Also, tell your health care provider if a mole is larger than the size of a pencil eraser.  A one-time screening for abdominal aortic aneurysm (AAA)  and surgical repair of large AAAs by ultrasound is recommended for men aged 65-75 years who are current or former smokers.  Stay current with your vaccines (immunizations). This information is not intended to replace advice given to you by your health care provider. Make sure you discuss any questions you have with your health care provider. Document Released: 02/17/2008 Document Revised: 09/11/2014 Document Reviewed: 05/25/2015 Elsevier Interactive Patient Education  2017 [employer].

## 2016-08-25 LAB — MICROALBUMIN / CREATININE URINE RATIO
CREATININE, UR: 174.8 mg/dL
Microalb/Creat Ratio: 305.7 mg/g creat — ABNORMAL HIGH (ref 0.0–30.0)
Microalbumin, Urine: 534.3 ug/mL

## 2016-08-31 DIAGNOSIS — I10 Essential (primary) hypertension: Secondary | ICD-10-CM | POA: Diagnosis not present

## 2016-08-31 DIAGNOSIS — M25519 Pain in unspecified shoulder: Secondary | ICD-10-CM | POA: Diagnosis not present

## 2016-08-31 DIAGNOSIS — F5109 Other insomnia not due to a substance or known physiological condition: Secondary | ICD-10-CM | POA: Diagnosis not present

## 2016-08-31 DIAGNOSIS — G8929 Other chronic pain: Secondary | ICD-10-CM | POA: Diagnosis not present

## 2016-08-31 DIAGNOSIS — Z79891 Long term (current) use of opiate analgesic: Secondary | ICD-10-CM | POA: Diagnosis not present

## 2016-08-31 DIAGNOSIS — E0590 Thyrotoxicosis, unspecified without thyrotoxic crisis or storm: Secondary | ICD-10-CM | POA: Diagnosis not present

## 2016-08-31 DIAGNOSIS — G2581 Restless legs syndrome: Secondary | ICD-10-CM | POA: Diagnosis not present

## 2016-08-31 DIAGNOSIS — E119 Type 2 diabetes mellitus without complications: Secondary | ICD-10-CM | POA: Diagnosis not present

## 2016-08-31 DIAGNOSIS — M5412 Radiculopathy, cervical region: Secondary | ICD-10-CM | POA: Diagnosis not present

## 2016-10-05 ENCOUNTER — Other Ambulatory Visit: Payer: Self-pay | Admitting: Nurse Practitioner

## 2016-10-05 DIAGNOSIS — I359 Nonrheumatic aortic valve disorder, unspecified: Secondary | ICD-10-CM

## 2016-10-10 ENCOUNTER — Ambulatory Visit (INDEPENDENT_AMBULATORY_CARE_PROVIDER_SITE_OTHER): Payer: PPO | Admitting: Pharmacist

## 2016-10-10 DIAGNOSIS — I480 Paroxysmal atrial fibrillation: Secondary | ICD-10-CM | POA: Diagnosis not present

## 2016-10-10 DIAGNOSIS — Z1211 Encounter for screening for malignant neoplasm of colon: Secondary | ICD-10-CM | POA: Diagnosis not present

## 2016-10-10 DIAGNOSIS — Z952 Presence of prosthetic heart valve: Secondary | ICD-10-CM

## 2016-10-10 LAB — COAGUCHEK XS/INR WAIVED
INR: 2.8 — ABNORMAL HIGH (ref 0.9–1.1)
Prothrombin Time: 33.5 s

## 2016-10-11 ENCOUNTER — Other Ambulatory Visit: Payer: PPO

## 2016-10-11 DIAGNOSIS — Z1211 Encounter for screening for malignant neoplasm of colon: Secondary | ICD-10-CM

## 2016-10-13 LAB — FECAL OCCULT BLOOD, IMMUNOCHEMICAL: Fecal Occult Bld: NEGATIVE

## 2016-10-30 DIAGNOSIS — F5109 Other insomnia not due to a substance or known physiological condition: Secondary | ICD-10-CM | POA: Diagnosis not present

## 2016-10-30 DIAGNOSIS — M5412 Radiculopathy, cervical region: Secondary | ICD-10-CM | POA: Diagnosis not present

## 2016-10-30 DIAGNOSIS — E0590 Thyrotoxicosis, unspecified without thyrotoxic crisis or storm: Secondary | ICD-10-CM | POA: Diagnosis not present

## 2016-10-30 DIAGNOSIS — Z79891 Long term (current) use of opiate analgesic: Secondary | ICD-10-CM | POA: Diagnosis not present

## 2016-10-30 DIAGNOSIS — G8929 Other chronic pain: Secondary | ICD-10-CM | POA: Diagnosis not present

## 2016-10-30 DIAGNOSIS — G2581 Restless legs syndrome: Secondary | ICD-10-CM | POA: Diagnosis not present

## 2016-10-30 DIAGNOSIS — E119 Type 2 diabetes mellitus without complications: Secondary | ICD-10-CM | POA: Diagnosis not present

## 2016-10-30 DIAGNOSIS — M25519 Pain in unspecified shoulder: Secondary | ICD-10-CM | POA: Diagnosis not present

## 2016-10-30 DIAGNOSIS — I10 Essential (primary) hypertension: Secondary | ICD-10-CM | POA: Diagnosis not present

## 2016-11-16 ENCOUNTER — Encounter: Payer: Self-pay | Admitting: Cardiovascular Disease

## 2016-11-16 ENCOUNTER — Other Ambulatory Visit: Payer: Self-pay | Admitting: Nurse Practitioner

## 2016-11-17 NOTE — Telephone Encounter (Signed)
Patient has appointment on 03/23

## 2016-11-17 NOTE — Telephone Encounter (Signed)
Last refill without being seen 

## 2016-11-17 NOTE — Telephone Encounter (Signed)
No TSH since 2015

## 2016-11-24 ENCOUNTER — Ambulatory Visit (INDEPENDENT_AMBULATORY_CARE_PROVIDER_SITE_OTHER): Payer: PPO | Admitting: Nurse Practitioner

## 2016-11-24 ENCOUNTER — Encounter: Payer: Self-pay | Admitting: Nurse Practitioner

## 2016-11-24 VITALS — BP 138/74 | HR 60 | Temp 97.3°F | Ht 66.0 in | Wt 230.0 lb

## 2016-11-24 DIAGNOSIS — F3342 Major depressive disorder, recurrent, in full remission: Secondary | ICD-10-CM

## 2016-11-24 DIAGNOSIS — G2581 Restless legs syndrome: Secondary | ICD-10-CM

## 2016-11-24 DIAGNOSIS — E039 Hypothyroidism, unspecified: Secondary | ICD-10-CM

## 2016-11-24 DIAGNOSIS — N183 Chronic kidney disease, stage 3 unspecified: Secondary | ICD-10-CM

## 2016-11-24 DIAGNOSIS — N400 Enlarged prostate without lower urinary tract symptoms: Secondary | ICD-10-CM

## 2016-11-24 DIAGNOSIS — I359 Nonrheumatic aortic valve disorder, unspecified: Secondary | ICD-10-CM | POA: Diagnosis not present

## 2016-11-24 DIAGNOSIS — Z794 Long term (current) use of insulin: Secondary | ICD-10-CM | POA: Diagnosis not present

## 2016-11-24 DIAGNOSIS — Z950 Presence of cardiac pacemaker: Secondary | ICD-10-CM

## 2016-11-24 DIAGNOSIS — E785 Hyperlipidemia, unspecified: Secondary | ICD-10-CM | POA: Diagnosis not present

## 2016-11-24 DIAGNOSIS — I480 Paroxysmal atrial fibrillation: Secondary | ICD-10-CM | POA: Diagnosis not present

## 2016-11-24 DIAGNOSIS — F5101 Primary insomnia: Secondary | ICD-10-CM

## 2016-11-24 DIAGNOSIS — E663 Overweight: Secondary | ICD-10-CM | POA: Diagnosis not present

## 2016-11-24 DIAGNOSIS — I10 Essential (primary) hypertension: Secondary | ICD-10-CM

## 2016-11-24 DIAGNOSIS — I214 Non-ST elevation (NSTEMI) myocardial infarction: Secondary | ICD-10-CM | POA: Diagnosis not present

## 2016-11-24 DIAGNOSIS — Z125 Encounter for screening for malignant neoplasm of prostate: Secondary | ICD-10-CM

## 2016-11-24 DIAGNOSIS — I25119 Atherosclerotic heart disease of native coronary artery with unspecified angina pectoris: Secondary | ICD-10-CM | POA: Diagnosis not present

## 2016-11-24 DIAGNOSIS — E119 Type 2 diabetes mellitus without complications: Secondary | ICD-10-CM | POA: Diagnosis not present

## 2016-11-24 DIAGNOSIS — Z952 Presence of prosthetic heart valve: Secondary | ICD-10-CM

## 2016-11-24 LAB — COAGUCHEK XS/INR WAIVED
INR: 2 — ABNORMAL HIGH (ref 0.9–1.1)
PROTHROMBIN TIME: 24.1 s

## 2016-11-24 LAB — BAYER DCA HB A1C WAIVED: HB A1C (BAYER DCA - WAIVED): 6.2 % (ref <7.0)

## 2016-11-24 MED ORDER — PRAMIPEXOLE DIHYDROCHLORIDE 0.5 MG PO TABS: 1.0000 mg | ORAL_TABLET | Freq: Two times a day (BID) | ORAL | 1 refills | Status: DC

## 2016-11-24 MED ORDER — TRAZODONE HCL 100 MG PO TABS: ORAL_TABLET | ORAL | 1 refills | Status: DC

## 2016-11-24 MED ORDER — PRAVASTATIN SODIUM 40 MG PO TABS: ORAL_TABLET | ORAL | 1 refills | Status: DC

## 2016-11-24 MED ORDER — TAMSULOSIN HCL 0.4 MG PO CAPS: 0.4000 mg | ORAL_CAPSULE | Freq: Every day | ORAL | 1 refills | Status: DC | PRN

## 2016-11-24 MED ORDER — SERTRALINE HCL 100 MG PO TABS: 100.0000 mg | ORAL_TABLET | Freq: Every day | ORAL | 1 refills | Status: DC

## 2016-11-24 MED ORDER — METFORMIN HCL 1000 MG PO TABS: ORAL_TABLET | ORAL | 1 refills | Status: DC

## 2016-11-24 MED ORDER — LEVOTHYROXINE SODIUM 88 MCG PO TABS: 88.0000 ug | ORAL_TABLET | Freq: Every day | ORAL | 1 refills | Status: DC

## 2016-11-24 MED ORDER — INSULIN GLARGINE 100 UNIT/ML ~~LOC~~ SOLN: 40.0000 [IU] | Freq: Every day | SUBCUTANEOUS | 1 refills | Status: DC

## 2016-11-24 MED ORDER — WARFARIN SODIUM 5 MG PO TABS: ORAL_TABLET | ORAL | 1 refills | Status: DC

## 2016-11-24 NOTE — Patient Instructions (Signed)
Anticoagulation Dose Instructions as of 11/24/2016      Glynis SmilesSun Mon Tue Wed Thu Fri Sat   New Dose 5 mg 2.5 mg 5 mg 5 mg 2.5 mg 5 mg 5 mg    Description   Take 7.5mg  ( 1 1/2 tablet ) Friday and Saturday then back to 5mg  daily except 2.5mg  on Monday and Thursday INR was 2.0 today    Recheck in 4 weeks

## 2016-11-24 NOTE — Progress Notes (Signed)
Subjective:    Patient ID: Andre Malone, male    DOB: June 16, 1951, 66 y.o.   MRN: [id number]  Patient here today for follow up of chronic medical problems. No changes since last visit. No complaints today.  Outpatient Encounter Prescriptions as of 05/26/2016  Medication Sig  . aspirin EC 81 MG EC tablet Take 1 tablet (81 mg total) by mouth daily.  . insulin glargine (LANTUS) 100 UNIT/ML injection Inject 40 Units into the skin daily after supper.   . levothyroxine (SYNTHROID, LEVOTHROID) 88 MCG tablet TAKE ONE TABLET BY MOUTH ONCE DAILY  . metFORMIN (GLUCOPHAGE) 1000 MG tablet TAKE 1 TABLET (1,000 MG TOTAL) BY MOUTH 2 (TWO) TIMES DAILY WITH A MEAL.  . methocarbamol (ROBAXIN) 500 MG tablet Take 1 tablet (500 mg total) by mouth every 6 (six) hours as needed for muscle spasms.  . naftifine (NAFTIN) 1 % cream Apply topically daily.  Marland Kitchen oxyCODONE (ROXICODONE) 15 MG immediate release tablet Take 15 mg by mouth every 6 (six) hours as needed.  . pramipexole (MIRAPEX) 0.5 MG tablet Take 1 mg by mouth 2 (two) times daily.   . pravastatin (PRAVACHOL) 40 MG tablet TAKE 1 TABLET (40 MG TOTAL) BY MOUTH EVERY EVENING. FOR CHOLESTEROL  . sertraline (ZOLOFT) 100 MG tablet Take 100 mg by mouth daily.    . Tamsulosin HCl (FLOMAX) 0.4 MG CAPS Take 0.4 mg by mouth daily as needed. Reported on 01/05/2016  . traMADol (ULTRAM) 50 MG tablet Take 50 mg by mouth every 12 (twelve) hours as needed for moderate pain.   . traZODone (DESYREL) 100 MG tablet TAKE ONE TABLET BY MOUTH AT BEDTIME  . warfarin (COUMADIN) 5 MG tablet TAKE 1 &1/2 BY MOUTH EVERY DAY ON MONDAY AND THURDAY AND 1 TABLET ON ALL OTHER DAYS    Diabetes  He presents for his follow-up diabetic visit. He has type 2 diabetes mellitus. His disease course has been stable. Pertinent negatives for hypoglycemia include no dizziness. Pertinent negatives for diabetes include no chest pain, no fatigue, no polydipsia, no polyphagia, no polyuria, no visual change and no  weakness. Diabetic complications include heart disease and peripheral neuropathy. Pertinent negatives for diabetic complications include no CVA or retinopathy. Risk factors for coronary artery disease include diabetes mellitus, dyslipidemia, hypertension, male sex and obesity. Current diabetic treatment includes insulin injections and oral agent (monotherapy). He is compliant with treatment most of the time. His weight is stable. He has not had a previous visit with a dietitian. He rarely participates in exercise. His breakfast blood glucose is taken between 8-9 am. His breakfast blood glucose range is generally 110-130 mg/dl. His overall blood glucose range is 110-130 mg/dl. An ACE inhibitor/angiotensin II receptor blocker is not being taken. He does not see a podiatrist.Eye exam is current.  Hyperlipidemia  This is a chronic problem. The current episode started more than 1 year ago. The problem is controlled. Recent lipid tests were reviewed and are normal. Exacerbating diseases include diabetes and obesity. He has no history of hypothyroidism. Pertinent negatives include no chest pain, myalgias or shortness of breath. Current antihyperlipidemic treatment includes statins. The current treatment provides moderate improvement of lipids. Compliance problems include adherence to diet and adherence to exercise.  Risk factors for coronary artery disease include dyslipidemia, diabetes mellitus, hypertension, male sex and obesity.  Hypertension  This is a chronic problem. The current episode started more than 1 year ago. The problem is controlled. Pertinent negatives include no chest pain, palpitations or shortness of breath.  Past treatments include calcium channel blockers. The current treatment provides moderate improvement. Compliance problems include diet and exercise.  Hypertensive end-organ damage includes CAD/MI and heart failure. There is no history of CVA or retinopathy. Identifiable causes of hypertension  include a thyroid problem.  Thyroid Problem  Presents for follow-up visit. Patient reports no constipation, diaphoresis, diarrhea, fatigue, palpitations or visual change. The symptoms have been stable. His past medical history is significant for diabetes, heart failure and hyperlipidemia.  Benign Prostatic Hypertrophy  This is a chronic problem. The problem has been waxing and waning since onset. Irritative symptoms do not include urgency. Pertinent negatives include no hematuria.  depression Currently on zoloft- Doing well -no C/O side effects RLS Mirapex and tramadol - helps a lot with symptoms insomnia trazadone to sleep everynight- feels rested in AM Aortic Valve replacement/ pacemaker/CAD/ hx MI On coumadin- Denies bleeding- see INR documentation Chronic kidney disease GFR improving   Review of Systems  Constitutional: Negative for diaphoresis and fatigue.  Respiratory: Negative for cough and shortness of breath.   Cardiovascular: Negative for chest pain and palpitations.  Gastrointestinal: Negative for blood in stool, constipation and diarrhea.  Endocrine: Negative for polydipsia, polyphagia and polyuria.  Genitourinary: Negative for hematuria and urgency.  Musculoskeletal: Negative for myalgias.  Neurological: Negative for dizziness and weakness.  Psychiatric/Behavioral: Negative for sleep disturbance.  All other systems reviewed and are negative.      Objective:   Physical Exam  Constitutional: He is oriented to person, place, and time. He appears well-developed and well-nourished.  HENT:  Head: Normocephalic.  Right Ear: External ear normal.  Left Ear: External ear normal.  Nose: Nose normal.  Mouth/Throat: Oropharynx is clear and moist.  Eyes: EOM are normal. Pupils are equal, round, and reactive to light.  Neck: Normal range of motion. Neck supple. No thyromegaly present.  Cardiovascular: Normal rate, regular rhythm, normal heart sounds and intact distal pulses.    No murmur heard. Audible aortic valve click  Pulmonary/Chest: Effort normal and breath sounds normal. He has no wheezes. He has no rales.  Abdominal: Soft. Bowel sounds are normal.  Musculoskeletal: Normal range of motion. He exhibits edema (1+ edema bil lower ext).  Neurological: He is alert and oriented to person, place, and time.  Skin: Skin is warm and dry.  Psychiatric: He has a normal mood and affect. His behavior is normal. Judgment and thought content normal.   BP 138/74   Pulse 60   Temp 97.3 F (36.3 C) (Oral)   Ht 5\' 6"  (1.676 m)   Wt 230 lb (104.3 kg)   BMI 37.12 kg/m   Hgba1c discussed at appointment 6.2%      Assessment & Plan:   1. Type 2 diabetes mellitus treated with insulin (HCC)   2. Hyperlipidemia with target LDL less than 100   3. Essential hypertension   4. Screening for prostate cancer   5. Paroxysmal atrial fibrillation (HCC)   6. Aortic valve disorder   7. Atherosclerosis of native coronary artery of native heart with angina pectoris (HCC)   8. NSTEMI (non-ST elevated myocardial infarction) (HCC)   9. Hypothyroidism, unspecified type   10. Stage 3 chronic kidney disease   11. Overweight   12. Pacemaker  st Judes   13. RLS (restless legs syndrome)   14. Benign prostatic hyperplasia, unspecified whether lower urinary tract symptoms present   15. Primary insomnia   16. Recurrent major depressive disorder, in full remission (HCC)    Meds ordered this  encounter  Medications  . warfarin (COUMADIN) 5 MG tablet    Sig: TAKE 1 &1/2 BY MOUTH EVERY DAY ON MONDAY AND THURDAY AND 1 TABLET ON ALL OTHER DAYS    Dispense:  135 tablet    Refill:  1    Order Specific Question:   Supervising Provider    Answer:   VINCENT, CAROL L [[id number]]  . pravastatin (PRAVACHOL) 40 MG tablet    Sig: TAKE 1 TABLET (40 MG TOTAL) BY MOUTH EVERY EVENING. FOR CHOLESTEROL    Dispense:  90 tablet    Refill:  1    Order Specific Question:   Supervising Provider    Answer:    VINCENT, CAROL L [[id number]]  . insulin glargine (LANTUS) 100 UNIT/ML injection    Sig: Inject 0.4 mLs (40 Units total) into the skin daily after supper.    Dispense:  10 mL    Refill:  1    Order Specific Question:   Supervising Provider    Answer:   VINCENT, CAROL L [[id number]]  . pramipexole (MIRAPEX) 0.5 MG tablet    Sig: Take 2 tablets (1 mg total) by mouth 2 (two) times daily.    Dispense:  180 tablet    Refill:  1    Order Specific Question:   Supervising Provider    Answer:   VINCENT, CAROL L [[id number]]  . tamsulosin (FLOMAX) 0.4 MG CAPS capsule    Sig: Take 1 capsule (0.4 mg total) by mouth daily as needed. Reported on 01/05/2016    Dispense:  90 capsule    Refill:  1    Order Specific Question:   Supervising Provider    Answer:   VINCENT, CAROL L [[id number]]  . traZODone (DESYREL) 100 MG tablet    Sig: TAKE ONE TABLET BY MOUTH AT BEDTIME    Dispense:  90 tablet    Refill:  1    Order Specific Question:   Supervising Provider    Answer:   VINCENT, CAROL L [[id number]]  . levothyroxine (SYNTHROID, LEVOTHROID) 88 MCG tablet    Sig: Take 1 tablet (88 mcg total) by mouth daily.    Dispense:  90 tablet    Refill:  1    Order Specific Question:   Supervising Provider    Answer:   VINCENT, CAROL L [[id number]]  . sertraline (ZOLOFT) 100 MG tablet    Sig: Take 1 tablet (100 mg total) by mouth daily.    Dispense:  90 tablet    Refill:  1    Order Specific Question:   Supervising Provider    Answer:   VINCENT, CAROL L [[id number]]  . metFORMIN (GLUCOPHAGE) 1000 MG tablet    Sig: TAKE 1 TABLET (1,000 MG TOTAL) BY MOUTH 2 (TWO) TIMES DAILY WITH A MEAL.    Dispense:  180 tablet    Refill:  1    Order Specific Question:   Supervising Provider    Answer:   VINCENT, CAROL L [[id number]]   Diet and exercise discussed Labs pending Follow up in 3 months See anticoag documentation  [name], FNP

## 2016-11-25 LAB — CMP14+EGFR
ALK PHOS: 80 IU/L (ref 39–117)
ALT: 16 IU/L (ref 0–44)
AST: 21 IU/L (ref 0–40)
Albumin/Globulin Ratio: 1.2 (ref 1.2–2.2)
Albumin: 4 g/dL (ref 3.6–4.8)
BILIRUBIN TOTAL: 0.4 mg/dL (ref 0.0–1.2)
BUN / CREAT RATIO: 16 (ref 10–24)
BUN: 17 mg/dL (ref 8–27)
CHLORIDE: 99 mmol/L (ref 96–106)
CO2: 22 mmol/L (ref 18–29)
Calcium: 9.2 mg/dL (ref 8.6–10.2)
Creatinine, Ser: 1.09 mg/dL (ref 0.76–1.27)
GFR calc Af Amer: 82 mL/min/{1.73_m2} (ref >59)
GFR calc non Af Amer: 71 mL/min/{1.73_m2} (ref >59)
GLOBULIN, TOTAL: 3.3 g/dL (ref 1.5–4.5)
Glucose: 125 mg/dL — ABNORMAL HIGH (ref 65–99)
Potassium: 4.3 mmol/L (ref 3.5–5.2)
SODIUM: 137 mmol/L (ref 134–144)
Total Protein: 7.3 g/dL (ref 6.0–8.5)

## 2016-11-25 LAB — LIPID PANEL
CHOLESTEROL TOTAL: 187 mg/dL (ref 100–199)
Chol/HDL Ratio: 4.6 ratio units (ref 0.0–5.0)
HDL: 41 mg/dL (ref >39)
LDL CALC: 98 mg/dL (ref 0–99)
Triglycerides: 241 mg/dL — ABNORMAL HIGH (ref 0–149)
VLDL Cholesterol Cal: 48 mg/dL — ABNORMAL HIGH (ref 5–40)

## 2016-11-25 LAB — PSA, TOTAL AND FREE
PSA FREE PCT: 26.3 %
PSA FREE: 0.21 ng/mL
Prostate Specific Ag, Serum: 0.8 ng/mL (ref 0.0–4.0)

## 2016-11-27 ENCOUNTER — Ambulatory Visit: Payer: PPO | Admitting: Cardiovascular Disease
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
## 2016-12-09 ENCOUNTER — Emergency Department (HOSPITAL_COMMUNITY): Payer: PPO

## 2016-12-09 ENCOUNTER — Encounter (HOSPITAL_COMMUNITY): Payer: Self-pay | Admitting: *Deleted

## 2016-12-09 ENCOUNTER — Emergency Department (HOSPITAL_COMMUNITY)
Admission: EM | Admit: 2016-12-09 | Discharge: 2016-12-09 | Disposition: A | Discharge status: Home / Self Care | Payer: PPO | Attending: Emergency Medicine | Admitting: Emergency Medicine

## 2016-12-09 DIAGNOSIS — I252 Old myocardial infarction: Secondary | ICD-10-CM | POA: Insufficient documentation

## 2016-12-09 DIAGNOSIS — Y9301 Activity, walking, marching and hiking: Secondary | ICD-10-CM | POA: Diagnosis not present

## 2016-12-09 DIAGNOSIS — I2510 Atherosclerotic heart disease of native coronary artery without angina pectoris: Secondary | ICD-10-CM | POA: Diagnosis not present

## 2016-12-09 DIAGNOSIS — E1140 Type 2 diabetes mellitus with diabetic neuropathy, unspecified: Secondary | ICD-10-CM | POA: Diagnosis not present

## 2016-12-09 DIAGNOSIS — Z950 Presence of cardiac pacemaker: Secondary | ICD-10-CM | POA: Diagnosis not present

## 2016-12-09 DIAGNOSIS — E1122 Type 2 diabetes mellitus with diabetic chronic kidney disease: Secondary | ICD-10-CM | POA: Diagnosis not present

## 2016-12-09 DIAGNOSIS — I129 Hypertensive chronic kidney disease with stage 1 through stage 4 chronic kidney disease, or unspecified chronic kidney disease: Secondary | ICD-10-CM | POA: Insufficient documentation

## 2016-12-09 DIAGNOSIS — Y929 Unspecified place or not applicable: Secondary | ICD-10-CM | POA: Insufficient documentation

## 2016-12-09 DIAGNOSIS — Z7901 Long term (current) use of anticoagulants: Secondary | ICD-10-CM | POA: Insufficient documentation

## 2016-12-09 DIAGNOSIS — Z96642 Presence of left artificial hip joint: Secondary | ICD-10-CM | POA: Insufficient documentation

## 2016-12-09 DIAGNOSIS — Z794 Long term (current) use of insulin: Secondary | ICD-10-CM | POA: Diagnosis not present

## 2016-12-09 DIAGNOSIS — M7989 Other specified soft tissue disorders: Secondary | ICD-10-CM | POA: Diagnosis not present

## 2016-12-09 DIAGNOSIS — Y999 Unspecified external cause status: Secondary | ICD-10-CM | POA: Insufficient documentation

## 2016-12-09 DIAGNOSIS — N189 Chronic kidney disease, unspecified: Secondary | ICD-10-CM | POA: Insufficient documentation

## 2016-12-09 DIAGNOSIS — M25571 Pain in right ankle and joints of right foot: Secondary | ICD-10-CM | POA: Diagnosis not present

## 2016-12-09 DIAGNOSIS — Z7982 Long term (current) use of aspirin: Secondary | ICD-10-CM | POA: Diagnosis not present

## 2016-12-09 DIAGNOSIS — S8991XA Unspecified injury of right lower leg, initial encounter: Secondary | ICD-10-CM | POA: Diagnosis not present

## 2016-12-09 DIAGNOSIS — S80811A Abrasion, right lower leg, initial encounter: Secondary | ICD-10-CM | POA: Diagnosis not present

## 2016-12-09 DIAGNOSIS — X501XXA Overexertion from prolonged static or awkward postures, initial encounter: Secondary | ICD-10-CM | POA: Diagnosis not present
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
## 2016-12-09 NOTE — ED Notes (Signed)
Patient transported to X-ray 

## 2016-12-09 NOTE — ED Triage Notes (Signed)
Pt injuring right ankle last night while walking the dog, swelling noted.

## 2016-12-26 DIAGNOSIS — G8929 Other chronic pain: Secondary | ICD-10-CM | POA: Diagnosis not present

## 2016-12-26 DIAGNOSIS — I10 Essential (primary) hypertension: Secondary | ICD-10-CM | POA: Diagnosis not present

## 2016-12-26 DIAGNOSIS — Z79891 Long term (current) use of opiate analgesic: Secondary | ICD-10-CM | POA: Diagnosis not present

## 2016-12-26 DIAGNOSIS — M5412 Radiculopathy, cervical region: Secondary | ICD-10-CM | POA: Diagnosis not present

## 2016-12-26 DIAGNOSIS — E119 Type 2 diabetes mellitus without complications: Secondary | ICD-10-CM | POA: Diagnosis not present

## 2016-12-26 DIAGNOSIS — M25519 Pain in unspecified shoulder: Secondary | ICD-10-CM | POA: Diagnosis not present

## 2016-12-26 DIAGNOSIS — G2581 Restless legs syndrome: Secondary | ICD-10-CM | POA: Diagnosis not present

## 2016-12-26 DIAGNOSIS — E0590 Thyrotoxicosis, unspecified without thyrotoxic crisis or storm: Secondary | ICD-10-CM | POA: Diagnosis not present

## 2016-12-26 DIAGNOSIS — F5109 Other insomnia not due to a substance or known physiological condition: Secondary | ICD-10-CM | POA: Diagnosis not present

## 2016-12-29 ENCOUNTER — Ambulatory Visit (INDEPENDENT_AMBULATORY_CARE_PROVIDER_SITE_OTHER): Payer: PPO | Admitting: Cardiovascular Disease

## 2016-12-29 ENCOUNTER — Encounter: Payer: Self-pay | Admitting: Cardiovascular Disease

## 2016-12-29 VITALS — BP 160/90 | HR 66 | Ht 66.0 in | Wt 229.8 lb

## 2016-12-29 DIAGNOSIS — I359 Nonrheumatic aortic valve disorder, unspecified: Secondary | ICD-10-CM | POA: Diagnosis not present

## 2016-12-29 DIAGNOSIS — I2510 Atherosclerotic heart disease of native coronary artery without angina pectoris: Secondary | ICD-10-CM | POA: Diagnosis not present

## 2016-12-29 DIAGNOSIS — I10 Essential (primary) hypertension: Secondary | ICD-10-CM | POA: Diagnosis not present

## 2016-12-29 MED ORDER — HYDRALAZINE HCL 25 MG PO TABS: 25.0000 mg | ORAL_TABLET | Freq: Two times a day (BID) | ORAL | 11 refills | Status: DC

## 2016-12-29 NOTE — Patient Instructions (Signed)
Medication Instructions:  Your physician has recommended you make the following change in your medication:  Start Hydralazine 25 mg by mouth twice daily.    Labwork: none  Testing/Procedures: none  Follow-Up: Please schedule follow up appointment with Dr. Graciela Husbands  Your physician recommends that you schedule a follow-up appointment in: 12 months. Please call our office in about 9 months to schedule this appointment.  Remote monitoring is used to monitor your Pacemaker of ICD from home. This monitoring reduces the number of office visits required to check your device to one time per year. It allows [place] to keep an eye on the functioning of your device to ensure it is working properly. You are scheduled for a device check from home on  01/15/17. You may send your transmission at any time that day. If you have a wireless device, the transmission will be sent automatically. After your physician reviews your transmission, you will receive a postcard with your next transmission date.      Any Other Special Instructions Will Be Listed Below (If Applicable).     If you need a refill on your cardiac medications before your next appointment, please call your pharmacy.
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
## 2017-01-02 ENCOUNTER — Telehealth: Payer: Self-pay | Admitting: Nurse Practitioner

## 2017-01-02 NOTE — Telephone Encounter (Signed)
I had called patient earlier to make appointment for INR.   When patient answered called he sounded terrible and he reported that he "felt bad" and then he passed phone to his wife who states that patient had started a new BP medication yesterday.  Instructed patient's wife to check his BP and call back .  BP was as noted 167/118 (he has not taken any medications yet this am).   She rechecked and BP was 115/66 pulse 66. Denies CP, SOB, N/V, fever, chills. Patient states he is feeling better - he checked BG and was 120.  He has not eaten breakfast yet.  Advised patient to eat breakfast, then take morning medications.  He is to continue to check BP throughout the day and call if BP continues to be over 140/90 or less than 100/60 or pulse less than 60.

## 2017-01-02 NOTE — Telephone Encounter (Signed)
Call given to front manager

## 2017-01-02 NOTE — Telephone Encounter (Signed)
Patient was calling to let Tammy know, patient's B/P is 167/118 and Pulse is 61

## 2017-01-10 ENCOUNTER — Ambulatory Visit (INDEPENDENT_AMBULATORY_CARE_PROVIDER_SITE_OTHER): Payer: PPO | Admitting: Pharmacist

## 2017-01-10 DIAGNOSIS — Z952 Presence of prosthetic heart valve: Secondary | ICD-10-CM | POA: Diagnosis not present

## 2017-01-10 DIAGNOSIS — I480 Paroxysmal atrial fibrillation: Secondary | ICD-10-CM

## 2017-01-10 LAB — COAGUCHEK XS/INR WAIVED
INR: 2.7 — ABNORMAL HIGH (ref 0.9–1.1)
PROTHROMBIN TIME: 32 s

## 2017-01-15 ENCOUNTER — Ambulatory Visit (INDEPENDENT_AMBULATORY_CARE_PROVIDER_SITE_OTHER): Payer: PPO | Admitting: *Deleted

## 2017-01-15 ENCOUNTER — Ambulatory Visit: Payer: PPO | Admitting: Cardiovascular Disease

## 2017-01-15 DIAGNOSIS — I442 Atrioventricular block, complete: Secondary | ICD-10-CM | POA: Diagnosis not present

## 2017-01-15 NOTE — Progress Notes (Signed)
Remote pacemaker transmission.   

## 2017-01-16 ENCOUNTER — Encounter: Payer: Self-pay | Admitting: Cardiology

## 2017-01-16 LAB — CUP PACEART REMOTE DEVICE CHECK
Brady Statistic AP VP Percent: 26 %
Brady Statistic AP VS Percent: 1 %
Brady Statistic AS VP Percent: 73 %
Implantable Lead Implant Date: 20100913
Implantable Lead Location: 753860
Lead Channel Impedance Value: 550 Ohm
Lead Channel Pacing Threshold Amplitude: 0.75 V
Lead Channel Pacing Threshold Amplitude: 1 V
Lead Channel Pacing Threshold Pulse Width: 0.5 ms
Lead Channel Setting Pacing Amplitude: 2.5 V
Lead Channel Setting Pacing Pulse Width: 0.5 ms
Lead Channel Setting Sensing Sensitivity: 4 mV
MDC IDC LEAD IMPLANT DT: 20150114
MDC IDC LEAD LOCATION: 753859
MDC IDC MSMT BATTERY REMAINING LONGEVITY: 103 mo
MDC IDC MSMT BATTERY REMAINING PERCENTAGE: 95.5 %
MDC IDC MSMT BATTERY VOLTAGE: 2.99 V
MDC IDC MSMT LEADCHNL RA IMPEDANCE VALUE: 260 Ohm
MDC IDC MSMT LEADCHNL RA PACING THRESHOLD PULSEWIDTH: 0.5 ms
MDC IDC MSMT LEADCHNL RA SENSING INTR AMPL: 3.1 mV
MDC IDC MSMT LEADCHNL RV SENSING INTR AMPL: 11 mV
MDC IDC PG IMPLANT DT: 20150114
MDC IDC PG SERIAL: 7586540
MDC IDC SESS DTM: 20180514064641
MDC IDC SET LEADCHNL RA PACING AMPLITUDE: 2 V
MDC IDC STAT BRADY AS VS PERCENT: 1 %
MDC IDC STAT BRADY RA PERCENT PACED: 25 %
MDC IDC STAT BRADY RV PERCENT PACED: 98 %

## 2017-02-14 ENCOUNTER — Ambulatory Visit (INDEPENDENT_AMBULATORY_CARE_PROVIDER_SITE_OTHER): Payer: PPO | Admitting: Pharmacist

## 2017-02-14 DIAGNOSIS — I480 Paroxysmal atrial fibrillation: Secondary | ICD-10-CM | POA: Diagnosis not present

## 2017-02-14 DIAGNOSIS — Z952 Presence of prosthetic heart valve: Secondary | ICD-10-CM

## 2017-02-14 LAB — COAGUCHEK XS/INR WAIVED
INR: 1.4 — ABNORMAL HIGH (ref 0.9–1.1)
Prothrombin Time: 16.7 s

## 2017-02-22 DIAGNOSIS — I10 Essential (primary) hypertension: Secondary | ICD-10-CM | POA: Diagnosis not present

## 2017-02-22 DIAGNOSIS — M5412 Radiculopathy, cervical region: Secondary | ICD-10-CM | POA: Diagnosis not present

## 2017-02-22 DIAGNOSIS — F5109 Other insomnia not due to a substance or known physiological condition: Secondary | ICD-10-CM | POA: Diagnosis not present

## 2017-02-22 DIAGNOSIS — G2581 Restless legs syndrome: Secondary | ICD-10-CM | POA: Diagnosis not present

## 2017-02-22 DIAGNOSIS — E0590 Thyrotoxicosis, unspecified without thyrotoxic crisis or storm: Secondary | ICD-10-CM | POA: Diagnosis not present

## 2017-02-22 DIAGNOSIS — M25519 Pain in unspecified shoulder: Secondary | ICD-10-CM | POA: Diagnosis not present

## 2017-02-22 DIAGNOSIS — E119 Type 2 diabetes mellitus without complications: Secondary | ICD-10-CM | POA: Diagnosis not present

## 2017-02-22 DIAGNOSIS — Z79891 Long term (current) use of opiate analgesic: Secondary | ICD-10-CM | POA: Diagnosis not present

## 2017-02-22 DIAGNOSIS — G8929 Other chronic pain: Secondary | ICD-10-CM | POA: Diagnosis not present

## 2017-02-27 ENCOUNTER — Encounter: Payer: Self-pay | Admitting: Pharmacist

## 2017-02-27 ENCOUNTER — Telehealth: Payer: Self-pay | Admitting: Nurse Practitioner

## 2017-02-27 NOTE — Telephone Encounter (Signed)
Patient called and appt made for 03/05/2017 at 10am.

## 2017-03-05 ENCOUNTER — Encounter: Payer: Self-pay | Admitting: Pharmacist

## 2017-03-08 ENCOUNTER — Encounter: Payer: Self-pay | Admitting: Nurse Practitioner

## 2017-03-12 ENCOUNTER — Telehealth: Payer: Self-pay | Admitting: Nurse Practitioner

## 2017-03-12 NOTE — Telephone Encounter (Signed)
Will need to check INR to advise of any warfarin changesl Recommend continue current warfarin dose - 2.5mg  Mondays and Thursdays.  Take 5mg  all other days. Appt made to recheck INR for 03/20/17 (patient has appointments this week and could not come in sooner)

## 2017-03-15 ENCOUNTER — Encounter: Payer: PPO | Admitting: Internal Medicine

## 2017-03-15 ENCOUNTER — Encounter (HOSPITAL_COMMUNITY): Payer: Self-pay | Admitting: Emergency Medicine

## 2017-03-15 DIAGNOSIS — Z7982 Long term (current) use of aspirin: Secondary | ICD-10-CM | POA: Insufficient documentation

## 2017-03-15 DIAGNOSIS — I10 Essential (primary) hypertension: Secondary | ICD-10-CM | POA: Diagnosis not present

## 2017-03-15 DIAGNOSIS — Z7901 Long term (current) use of anticoagulants: Secondary | ICD-10-CM | POA: Insufficient documentation

## 2017-03-15 DIAGNOSIS — E039 Hypothyroidism, unspecified: Secondary | ICD-10-CM | POA: Diagnosis not present

## 2017-03-15 DIAGNOSIS — K047 Periapical abscess without sinus: Secondary | ICD-10-CM | POA: Diagnosis not present

## 2017-03-15 DIAGNOSIS — Z794 Long term (current) use of insulin: Secondary | ICD-10-CM | POA: Insufficient documentation

## 2017-03-15 DIAGNOSIS — K0889 Other specified disorders of teeth and supporting structures: Secondary | ICD-10-CM | POA: Diagnosis present

## 2017-03-15 DIAGNOSIS — G510 Bell's palsy: Secondary | ICD-10-CM | POA: Insufficient documentation

## 2017-03-15 DIAGNOSIS — N189 Chronic kidney disease, unspecified: Secondary | ICD-10-CM | POA: Diagnosis not present

## 2017-03-15 DIAGNOSIS — I2510 Atherosclerotic heart disease of native coronary artery without angina pectoris: Secondary | ICD-10-CM | POA: Diagnosis not present

## 2017-03-15 DIAGNOSIS — F419 Anxiety disorder, unspecified: Secondary | ICD-10-CM | POA: Insufficient documentation

## 2017-03-15 DIAGNOSIS — Z79899 Other long term (current) drug therapy: Secondary | ICD-10-CM | POA: Insufficient documentation

## 2017-03-15 DIAGNOSIS — Z950 Presence of cardiac pacemaker: Secondary | ICD-10-CM | POA: Diagnosis not present

## 2017-03-15 DIAGNOSIS — E1122 Type 2 diabetes mellitus with diabetic chronic kidney disease: Secondary | ICD-10-CM | POA: Insufficient documentation

## 2017-03-15 DIAGNOSIS — Z7902 Long term (current) use of antithrombotics/antiplatelets: Secondary | ICD-10-CM | POA: Diagnosis not present

## 2017-03-15 DIAGNOSIS — Z96642 Presence of left artificial hip joint: Secondary | ICD-10-CM | POA: Insufficient documentation

## 2017-03-15 DIAGNOSIS — I129 Hypertensive chronic kidney disease with stage 1 through stage 4 chronic kidney disease, or unspecified chronic kidney disease: Secondary | ICD-10-CM | POA: Insufficient documentation

## 2017-03-15 NOTE — ED Triage Notes (Signed)
Patient reports lower dental pain onset today with swelling , hypertensive at arrival , denies fever /no SOB .

## 2017-03-16 ENCOUNTER — Emergency Department (HOSPITAL_COMMUNITY)
Admission: EM | Admit: 2017-03-16 | Discharge: 2017-03-16 | Disposition: A | Discharge status: Home / Self Care | Payer: PPO | Attending: Emergency Medicine | Admitting: Emergency Medicine

## 2017-03-16 ENCOUNTER — Telehealth: Payer: Self-pay | Admitting: Nurse Practitioner

## 2017-03-16 DIAGNOSIS — K047 Periapical abscess without sinus: Secondary | ICD-10-CM

## 2017-03-16 DIAGNOSIS — I10 Essential (primary) hypertension: Secondary | ICD-10-CM

## 2017-03-16 LAB — COMPREHENSIVE METABOLIC PANEL
ALT: 15 U/L — ABNORMAL LOW (ref 17–63)
AST: 25 U/L (ref 15–41)
Albumin: 4 g/dL (ref 3.5–5.0)
Alkaline Phosphatase: 68 U/L (ref 38–126)
Anion gap: 4 — ABNORMAL LOW (ref 5–15)
BILIRUBIN TOTAL: 0.7 mg/dL (ref 0.3–1.2)
BUN: 7 mg/dL (ref 6–20)
CO2: 27 mmol/L (ref 22–32)
CREATININE: 0.91 mg/dL (ref 0.61–1.24)
Calcium: 9.1 mg/dL (ref 8.9–10.3)
Chloride: 103 mmol/L (ref 101–111)
GFR calc Af Amer: >60 mL/min (ref >60)
Glucose, Bld: 201 mg/dL — ABNORMAL HIGH (ref 65–99)
POTASSIUM: 3.8 mmol/L (ref 3.5–5.1)
Sodium: 134 mmol/L — ABNORMAL LOW (ref 135–145)
TOTAL PROTEIN: 7.4 g/dL (ref 6.5–8.1)

## 2017-03-16 LAB — CBC WITH DIFFERENTIAL/PLATELET
BASOS ABS: 0 10*3/uL (ref 0.0–0.1)
Basophils Relative: 0 %
Eosinophils Absolute: 0.3 10*3/uL (ref 0.0–0.7)
Eosinophils Relative: 2 %
HEMATOCRIT: 41.2 % (ref 39.0–52.0)
Hemoglobin: 13.2 g/dL (ref 13.0–17.0)
LYMPHS ABS: 1.6 10*3/uL (ref 0.7–4.0)
LYMPHS PCT: 14 %
MCH: 28.4 pg (ref 26.0–34.0)
MCHC: 32 g/dL (ref 30.0–36.0)
MCV: 88.6 fL (ref 78.0–100.0)
MONO ABS: 0.7 10*3/uL (ref 0.1–1.0)
MONOS PCT: 6 %
NEUTROS ABS: 8.8 10*3/uL — AB (ref 1.7–7.7)
Neutrophils Relative %: 78 %
Platelets: 186 10*3/uL (ref 150–400)
RBC: 4.65 MIL/uL (ref 4.22–5.81)
RDW: 16.2 % — AB (ref 11.5–15.5)
WBC: 11.3 10*3/uL — ABNORMAL HIGH (ref 4.0–10.5)

## 2017-03-16 LAB — PROTIME-INR
INR: 2.04
PROTHROMBIN TIME: 23.3 s — AB (ref 11.4–15.2)

## 2017-03-16 MED ORDER — CLINDAMYCIN HCL 150 MG PO CAPS
300.0000 mg | ORAL_CAPSULE | Freq: Once | ORAL | Status: AC
  Administered 2017-03-16: 300 mg via ORAL
  Filled 2017-03-16: qty 2

## 2017-03-16 MED ORDER — OXYCODONE HCL 5 MG PO TABS
15.0000 mg | ORAL_TABLET | Freq: Once | ORAL | Status: AC
  Administered 2017-03-16: 15 mg via ORAL
  Filled 2017-03-16: qty 3

## 2017-03-16 MED ORDER — CLINDAMYCIN HCL 300 MG PO CAPS: 300.0000 mg | ORAL_CAPSULE | Freq: Four times a day (QID) | ORAL | 0 refills | Status: DC

## 2017-03-16 MED ORDER — FENTANYL CITRATE (PF) 100 MCG/2ML IJ SOLN
100.0000 ug | Freq: Once | INTRAMUSCULAR | Status: AC
  Administered 2017-03-16: 100 ug via INTRAVENOUS
  Filled 2017-03-16: qty 2

## 2017-03-16 NOTE — Telephone Encounter (Signed)
Pt notified of recommendation Verbalizes understanding 
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
## 2017-03-16 NOTE — Telephone Encounter (Signed)
Yes - due to start of ABX it is recommended that patient have INR checked next Tuesdays as planned.

## 2017-03-20 ENCOUNTER — Encounter: Payer: Self-pay | Admitting: Internal Medicine

## 2017-03-20 ENCOUNTER — Ambulatory Visit (INDEPENDENT_AMBULATORY_CARE_PROVIDER_SITE_OTHER): Payer: PPO | Admitting: Internal Medicine

## 2017-03-20 ENCOUNTER — Ambulatory Visit (INDEPENDENT_AMBULATORY_CARE_PROVIDER_SITE_OTHER): Payer: PPO | Admitting: Pharmacist

## 2017-03-20 VITALS — BP 140/74 | HR 73 | Ht 66.0 in | Wt 230.0 lb

## 2017-03-20 DIAGNOSIS — I10 Essential (primary) hypertension: Secondary | ICD-10-CM | POA: Diagnosis not present

## 2017-03-20 DIAGNOSIS — I359 Nonrheumatic aortic valve disorder, unspecified: Secondary | ICD-10-CM

## 2017-03-20 DIAGNOSIS — I442 Atrioventricular block, complete: Secondary | ICD-10-CM | POA: Diagnosis not present

## 2017-03-20 DIAGNOSIS — I480 Paroxysmal atrial fibrillation: Secondary | ICD-10-CM

## 2017-03-20 DIAGNOSIS — T82110A Breakdown (mechanical) of cardiac electrode, initial encounter: Secondary | ICD-10-CM

## 2017-03-20 DIAGNOSIS — Z952 Presence of prosthetic heart valve: Secondary | ICD-10-CM

## 2017-03-20 DIAGNOSIS — Z950 Presence of cardiac pacemaker: Secondary | ICD-10-CM

## 2017-03-20 LAB — COAGUCHEK XS/INR WAIVED
INR: 2.4 — ABNORMAL HIGH (ref 0.9–1.1)
PROTHROMBIN TIME: 28.4 s
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
## 2017-03-20 NOTE — Patient Instructions (Signed)
Anticoagulation Warfarin Dose Instructions as of 03/20/2017      Glynis SmilesSun Mon Tue Wed Thu Fri Sat   New Dose 5 mg 2.5 mg 5 mg 5 mg 2.5 mg 5 mg 5 mg    Description   Continue current warfarin 5mg  dose - take 5mg  daily except 2.5mg  (=1/2 tablet) on Monday and Thursday  INR was 2.4 today

## 2017-03-20 NOTE — Patient Instructions (Signed)

## 2017-04-16 ENCOUNTER — Ambulatory Visit (INDEPENDENT_AMBULATORY_CARE_PROVIDER_SITE_OTHER): Payer: PPO | Admitting: *Deleted

## 2017-04-16 DIAGNOSIS — I442 Atrioventricular block, complete: Secondary | ICD-10-CM

## 2017-04-16 DIAGNOSIS — I480 Paroxysmal atrial fibrillation: Secondary | ICD-10-CM

## 2017-04-16 NOTE — Progress Notes (Signed)
Remote pacemaker check. 

## 2017-04-17 LAB — CUP PACEART REMOTE DEVICE CHECK
Battery Remaining Longevity: 100 mo
Battery Remaining Percentage: 95.5 %
Brady Statistic AP VP Percent: 29 %
Brady Statistic AP VS Percent: 1 %
Brady Statistic AS VP Percent: 62 %
Brady Statistic AS VS Percent: 1 %
Brady Statistic RA Percent Paced: 21 %
Brady Statistic RV Percent Paced: 92 %
Implantable Lead Implant Date: 20100913
Implantable Lead Location: 753859
Lead Channel Impedance Value: 250 Ohm
Lead Channel Impedance Value: 510 Ohm
Lead Channel Pacing Threshold Amplitude: 0.75 V
Lead Channel Pacing Threshold Amplitude: 1 V
Lead Channel Pacing Threshold Pulse Width: 0.5 ms
Lead Channel Pacing Threshold Pulse Width: 0.7 ms
Lead Channel Sensing Intrinsic Amplitude: 3.5 mV
Lead Channel Setting Pacing Amplitude: 2 V
Lead Channel Setting Pacing Amplitude: 2.5 V
MDC IDC LEAD IMPLANT DT: 20150114
MDC IDC LEAD LOCATION: 753860
MDC IDC MSMT BATTERY VOLTAGE: 2.99 V
MDC IDC MSMT LEADCHNL RV SENSING INTR AMPL: 9.5 mV
MDC IDC PG IMPLANT DT: 20150114
MDC IDC PG SERIAL: 7586540
MDC IDC SESS DTM: 20180813064048
MDC IDC SET LEADCHNL RV PACING PULSEWIDTH: 0.5 ms
MDC IDC SET LEADCHNL RV SENSING SENSITIVITY: 4 mV

## 2017-04-19 ENCOUNTER — Ambulatory Visit (INDEPENDENT_AMBULATORY_CARE_PROVIDER_SITE_OTHER): Payer: PPO | Admitting: Pharmacist

## 2017-04-19 DIAGNOSIS — I480 Paroxysmal atrial fibrillation: Secondary | ICD-10-CM

## 2017-04-19 DIAGNOSIS — Z952 Presence of prosthetic heart valve: Secondary | ICD-10-CM | POA: Diagnosis not present

## 2017-04-19 LAB — COAGUCHEK XS/INR WAIVED
INR: 1.9 — AB (ref 0.9–1.1)
Prothrombin Time: 22.3 s

## 2017-04-19 NOTE — Patient Instructions (Signed)
Anticoagulation Warfarin Dose Instructions as of 04/19/2017      Glynis SmilesSun Mon Tue Wed Thu Fri Sat   New Dose 5 mg 2.5 mg 5 mg 5 mg 5 mg 5 mg 5 mg    Description   Continue current warfarin 5mg  dose - take 5mg  daily except 2.5mg  (=1/2 tablet) on Monday   INR was 1.9 today

## 2017-04-23 DIAGNOSIS — Z79891 Long term (current) use of opiate analgesic: Secondary | ICD-10-CM | POA: Diagnosis not present

## 2017-04-23 DIAGNOSIS — E119 Type 2 diabetes mellitus without complications: Secondary | ICD-10-CM | POA: Diagnosis not present

## 2017-04-23 DIAGNOSIS — M5412 Radiculopathy, cervical region: Secondary | ICD-10-CM | POA: Diagnosis not present

## 2017-04-23 DIAGNOSIS — G8929 Other chronic pain: Secondary | ICD-10-CM | POA: Diagnosis not present

## 2017-04-23 DIAGNOSIS — F5109 Other insomnia not due to a substance or known physiological condition: Secondary | ICD-10-CM | POA: Diagnosis not present

## 2017-04-23 DIAGNOSIS — M25519 Pain in unspecified shoulder: Secondary | ICD-10-CM | POA: Diagnosis not present

## 2017-04-23 DIAGNOSIS — I10 Essential (primary) hypertension: Secondary | ICD-10-CM | POA: Diagnosis not present

## 2017-04-23 DIAGNOSIS — E0590 Thyrotoxicosis, unspecified without thyrotoxic crisis or storm: Secondary | ICD-10-CM | POA: Diagnosis not present

## 2017-04-23 DIAGNOSIS — G2581 Restless legs syndrome: Secondary | ICD-10-CM | POA: Diagnosis not present

## 2017-04-26 ENCOUNTER — Encounter: Payer: Self-pay | Admitting: Cardiology

## 2017-05-10 ENCOUNTER — Ambulatory Visit (INDEPENDENT_AMBULATORY_CARE_PROVIDER_SITE_OTHER): Payer: PPO | Admitting: Nurse Practitioner

## 2017-05-10 ENCOUNTER — Encounter: Payer: Self-pay | Admitting: Nurse Practitioner

## 2017-05-10 VITALS — BP 138/71 | HR 60 | Temp 98.6°F | Ht 66.0 in | Wt 235.0 lb

## 2017-05-10 DIAGNOSIS — E663 Overweight: Secondary | ICD-10-CM | POA: Diagnosis not present

## 2017-05-10 DIAGNOSIS — F5101 Primary insomnia: Secondary | ICD-10-CM

## 2017-05-10 DIAGNOSIS — E039 Hypothyroidism, unspecified: Secondary | ICD-10-CM

## 2017-05-10 DIAGNOSIS — I359 Nonrheumatic aortic valve disorder, unspecified: Secondary | ICD-10-CM

## 2017-05-10 DIAGNOSIS — G2581 Restless legs syndrome: Secondary | ICD-10-CM | POA: Diagnosis not present

## 2017-05-10 DIAGNOSIS — I25119 Atherosclerotic heart disease of native coronary artery with unspecified angina pectoris: Secondary | ICD-10-CM

## 2017-05-10 DIAGNOSIS — I480 Paroxysmal atrial fibrillation: Secondary | ICD-10-CM

## 2017-05-10 DIAGNOSIS — N183 Chronic kidney disease, stage 3 unspecified: Secondary | ICD-10-CM

## 2017-05-10 DIAGNOSIS — E785 Hyperlipidemia, unspecified: Secondary | ICD-10-CM | POA: Diagnosis not present

## 2017-05-10 DIAGNOSIS — Z950 Presence of cardiac pacemaker: Secondary | ICD-10-CM

## 2017-05-10 DIAGNOSIS — Z952 Presence of prosthetic heart valve: Secondary | ICD-10-CM

## 2017-05-10 DIAGNOSIS — E1140 Type 2 diabetes mellitus with diabetic neuropathy, unspecified: Secondary | ICD-10-CM

## 2017-05-10 DIAGNOSIS — N400 Enlarged prostate without lower urinary tract symptoms: Secondary | ICD-10-CM

## 2017-05-10 DIAGNOSIS — Z794 Long term (current) use of insulin: Secondary | ICD-10-CM

## 2017-05-10 DIAGNOSIS — F3342 Major depressive disorder, recurrent, in full remission: Secondary | ICD-10-CM

## 2017-05-10 LAB — BAYER DCA HB A1C WAIVED: HB A1C (BAYER DCA - WAIVED): 7 % — ABNORMAL HIGH (ref <7.0)

## 2017-05-10 LAB — COAGUCHEK XS/INR WAIVED
INR: 1.9 — ABNORMAL HIGH (ref 0.9–1.1)
Prothrombin Time: 23 s

## 2017-05-10 MED ORDER — SERTRALINE HCL 100 MG PO TABS: 100.0000 mg | ORAL_TABLET | Freq: Every day | ORAL | 1 refills | Status: DC

## 2017-05-10 MED ORDER — PRAVASTATIN SODIUM 40 MG PO TABS: ORAL_TABLET | ORAL | 1 refills | Status: DC

## 2017-05-10 MED ORDER — WARFARIN SODIUM 5 MG PO TABS: ORAL_TABLET | ORAL | 1 refills | Status: DC

## 2017-05-10 MED ORDER — LEVOTHYROXINE SODIUM 88 MCG PO TABS: 88.0000 ug | ORAL_TABLET | Freq: Every day | ORAL | 1 refills | Status: DC

## 2017-05-10 MED ORDER — TRAZODONE HCL 100 MG PO TABS: ORAL_TABLET | ORAL | 1 refills | Status: DC

## 2017-05-10 MED ORDER — TAMSULOSIN HCL 0.4 MG PO CAPS: 0.4000 mg | ORAL_CAPSULE | Freq: Every day | ORAL | 1 refills | Status: DC | PRN

## 2017-05-10 MED ORDER — PRAMIPEXOLE DIHYDROCHLORIDE 1.5 MG PO TABS: 1.5000 mg | ORAL_TABLET | Freq: Every day | ORAL | 1 refills | Status: DC

## 2017-05-10 NOTE — Patient Instructions (Signed)

## 2017-05-10 NOTE — Progress Notes (Signed)
Subjective:    Patient ID: Andre Malone, male    DOB: 03-Sep-1951, 66 y.o.   MRN: [id number]  HPI  Andre Malone is here today for follow up of chronic medical problem.  Outpatient Encounter Prescriptions as of 05/10/2017  Medication Sig  . aspirin EC 81 MG EC tablet Take 1 tablet (81 mg total) by mouth daily.  . insulin glargine (LANTUS) 100 UNIT/ML injection Inject 0.4 mLs (40 Units total) into the skin daily after supper.  . levothyroxine (SYNTHROID, LEVOTHROID) 88 MCG tablet Take 1 tablet (88 mcg total) by mouth daily.  Marland Kitchen LYRICA 75 MG capsule Take 75 mg by mouth 2 (two) times daily as needed.   . metFORMIN (GLUCOPHAGE) 1000 MG tablet TAKE 1 TABLET (1,000 MG TOTAL) BY MOUTH 2 (TWO) TIMES DAILY WITH A MEAL.  . methocarbamol (ROBAXIN) 500 MG tablet Take 1 tablet (500 mg total) by mouth every 6 (six) hours as needed for muscle spasms.  . naftifine (NAFTIN) 1 % cream Apply topically daily.  Marland Kitchen oxyCODONE (ROXICODONE) 15 MG immediate release tablet Take 15 mg by mouth every 6 (six) hours as needed.  . pramipexole (MIRAPEX) 1.5 MG tablet Take 1.5 mg by mouth at bedtime.  . pravastatin (PRAVACHOL) 40 MG tablet TAKE 1 TABLET (40 MG TOTAL) BY MOUTH EVERY EVENING. FOR CHOLESTEROL  . sertraline (ZOLOFT) 100 MG tablet Take 1 tablet (100 mg total) by mouth daily.  . tamsulosin (FLOMAX) 0.4 MG CAPS capsule Take 1 capsule (0.4 mg total) by mouth daily as needed. Reported on 01/05/2016  . traMADol (ULTRAM) 50 MG tablet Take 50 mg by mouth every 12 (twelve) hours as needed for moderate pain.   . traZODone (DESYREL) 100 MG tablet TAKE ONE TABLET BY MOUTH AT BEDTIME (Patient taking differently: at bedtime as needed. TAKE ONE TABLET BY MOUTH AT BEDTIME)  . warfarin (COUMADIN) 5 MG tablet Take 1/2 tablet on Monday and Thursday and then take 1 tablet all there other days   No facility-administered encounter medications on file as of 05/10/2017.     1. Paroxysmal atrial fibrillation [facility])  This is a new  diagnosis for him from seeing cardiologist in July- he was already on coumacdin from heart valve replacement, NSTEMI and pacemaker  2. Atherosclerosis of native coronary artery of native heart with angina pectoris ([place])  denies any recent chest pain, or worsening SOB  3. Type 2 diabetes mellitus with diabetic neuropathy, with long-term current use of insulin (HCC) Last hgba1c ws 6.2%- blood sugars have been averaging around 120 - no hypoglycemia  4. Hypothyroidism, unspecified type  No problems that he is aware of  5. Stage 3 chronic kidney disease  Currently just watching labs  6. RLS (restless legs syndrome)  mirapex helps with symptoms  7. Overweight  No recent weight changes  8. Hyperlipidemia with target LDL less than 100  Tries to avoid fried foods since he had heart attack  9. Pacemaker  [facility]   saw cardiology in July and had pacemaker interrogated- atrial lead is not working but ventricular lead is good and he is currently pacemaker dependent. They have decided just to watch it for now.  10.    Insomnia          Takes trazadone nightly to sleep- helps him rest well- likes to take                Naps during the day. 11.    Depression  Takes zoloft daily- seems to work well. He denies any side effects   New complaints: None today  Social history: His wife use to come with him to the doctor all the time but she has not been the last several visits.    Review of Systems  Constitutional: Negative for activity change and appetite change.  HENT: Negative.   Eyes: Negative for pain.  Respiratory: Negative for shortness of breath.   Cardiovascular: Negative for chest pain, palpitations and leg swelling.  Gastrointestinal: Negative for abdominal pain.  Endocrine: Negative for polydipsia.  Genitourinary: Negative.   Skin: Negative for rash.  Neurological: Negative for dizziness, weakness and headaches.  Hematological: Does not bruise/bleed easily.    Psychiatric/Behavioral: Negative.   All other systems reviewed and are negative.      Objective:   Physical Exam  Constitutional: He is oriented to person, place, and time. He appears well-developed and well-nourished.  HENT:  Head: Normocephalic.  Right Ear: External ear normal.  Left Ear: External ear normal.  Nose: Nose normal.  Mouth/Throat: Oropharynx is clear and moist.  Eyes: Pupils are equal, round, and reactive to light. EOM are normal.  Neck: Normal range of motion. Neck supple. No JVD present. No thyromegaly present.  Cardiovascular: Normal rate, regular rhythm and intact distal pulses.  Exam reveals no gallop and no friction rub.   Murmur (3/6 systolic murmur) heard. Aortic valve click audible  Pulmonary/Chest: Effort normal and breath sounds normal. No respiratory distress. He has no wheezes. He has no rales. He exhibits no tenderness.  Abdominal: Soft. Bowel sounds are normal. He exhibits no mass. There is no tenderness.  Genitourinary: Prostate normal and penis normal.  Musculoskeletal: Normal range of motion. He exhibits no edema.  Lymphadenopathy:    He has no cervical adenopathy.  Neurological: He is alert and oriented to person, place, and time. No cranial nerve deficit.  Skin: Skin is warm and dry.  Psychiatric: He has a normal mood and affect. His behavior is normal. Judgment and thought content normal.    BP 138/71   Pulse 60   Temp 98.6 F (37 C) (Oral)   Ht '5\' 6"'  (1.676 m)   Wt 235 lb (106.6 kg)   BMI 37.93 kg/m   Hgba1c discussed at appointment 7.0%      Assessment & Plan:  1. Paroxysmal atrial fibrillation (HCC) - CoaguChek XS/INR Waived  2. Atherosclerosis of native coronary artery of native heart with angina pectoris ([place])  3. Type 2 diabetes mellitus with diabetic neuropathy, with long-term current use of insulin (HCC) Continue to watch carbs in diet - Bayer DCA Hb A1c Waived  4. Hypothyroidism, unspecified type - levothyroxine  (SYNTHROID, LEVOTHROID) 88 MCG tablet; Take 1 tablet (88 mcg total) by mouth daily.  Dispense: 90 tablet; Refill: 1  5. Stage 3 chronic kidney disease Labs pending - CMP14+EGFR  6. RLS (restless legs syndrome) Keep legs warm at night  7. Overweight Discussed diet and exercise for person with BMI >25 Will recheck weight in 3-6 months  8. Hyperlipidemia with target LDL less than 100 Low fta diet encouraged - Lipid panel - pravastatin (PRAVACHOL) 40 MG tablet; TAKE 1 TABLET (40 MG TOTAL) BY MOUTH EVERY EVENING. FOR CHOLESTEROL  Dispense: 90 tablet; Refill: 1  9. Pacemaker  [facility] Keep follow uo with cardiology  10. S/P AVR (aortic valve replacement)  11. Aortic valve disorder See anticoag documentation - warfarin (COUMADIN) 5 MG tablet; Take 1/2 tablet on Monday and Thursday and  then take 1 tablet all there other days  Dispense: 90 tablet; Refill: 1  12. Benign prostatic hyperplasia, unspecified whether lower urinary tract symptoms present Keep follow up with urology - tamsulosin (FLOMAX) 0.4 MG CAPS capsule; Take 1 capsule (0.4 mg total) by mouth daily as needed. Reported on 01/05/2016  Dispense: 90 capsule; Refill: 1  13. Primary insomnia Bedtime routine - traZODone (DESYREL) 100 MG tablet; TAKE ONE TABLET BY MOUTH AT BEDTIME  Dispense: 90 tablet; Refill: 1  14. Recurrent major depressive disorder, in full remission ([place]) Stress management - sertraline (ZOLOFT) 100 MG tablet; Take 1 tablet (100 mg total) by mouth daily.  Dispense: 90 tablet; Refill: 1    Labs pending Health maintenance reviewed Diet and exercise encouraged Continue all meds Follow up  In 3 months   [place], FNP

## 2017-05-11 ENCOUNTER — Encounter: Payer: Self-pay | Admitting: Cardiology

## 2017-05-11 LAB — CMP14+EGFR
A/G RATIO: 1.4 (ref 1.2–2.2)
ALBUMIN: 3.9 g/dL (ref 3.6–4.8)
ALT: 16 IU/L (ref 0–44)
AST: 28 IU/L (ref 0–40)
Alkaline Phosphatase: 73 IU/L (ref 39–117)
BUN / CREAT RATIO: 16 (ref 10–24)
BUN: 14 mg/dL (ref 8–27)
Bilirubin Total: 0.3 mg/dL (ref 0.0–1.2)
CALCIUM: 8.9 mg/dL (ref 8.6–10.2)
CO2: 21 mmol/L (ref 20–29)
Chloride: 107 mmol/L — ABNORMAL HIGH (ref 96–106)
Creatinine, Ser: 0.89 mg/dL (ref 0.76–1.27)
GFR, EST AFRICAN AMERICAN: 104 mL/min/{1.73_m2} (ref >59)
GFR, EST NON AFRICAN AMERICAN: 90 mL/min/{1.73_m2} (ref >59)
GLUCOSE: 105 mg/dL — AB (ref 65–99)
Globulin, Total: 2.7 g/dL (ref 1.5–4.5)
Potassium: 4.6 mmol/L (ref 3.5–5.2)
Sodium: 143 mmol/L (ref 134–144)
TOTAL PROTEIN: 6.6 g/dL (ref 6.0–8.5)

## 2017-05-11 LAB — LIPID PANEL
CHOL/HDL RATIO: 3.9 ratio (ref 0.0–5.0)
Cholesterol, Total: 131 mg/dL (ref 100–199)
HDL: 34 mg/dL — AB (ref >39)
LDL Calculated: 63 mg/dL (ref 0–99)
Triglycerides: 171 mg/dL — ABNORMAL HIGH (ref 0–149)
VLDL CHOLESTEROL CAL: 34 mg/dL (ref 5–40)

## 2017-05-14 ENCOUNTER — Ambulatory Visit (INDEPENDENT_AMBULATORY_CARE_PROVIDER_SITE_OTHER): Payer: PPO | Admitting: Family Medicine

## 2017-05-14 ENCOUNTER — Encounter: Payer: Self-pay | Admitting: Family Medicine

## 2017-05-14 VITALS — BP 177/84 | HR 60 | Temp 97.1°F | Ht 66.0 in | Wt 234.0 lb

## 2017-05-14 DIAGNOSIS — R310 Gross hematuria: Secondary | ICD-10-CM

## 2017-05-14 DIAGNOSIS — R319 Hematuria, unspecified: Secondary | ICD-10-CM | POA: Diagnosis not present

## 2017-05-14 LAB — URINALYSIS, COMPLETE
Bilirubin, UA: NEGATIVE
Glucose, UA: NEGATIVE
Ketones, UA: NEGATIVE
Leukocytes, UA: NEGATIVE
Nitrite, UA: NEGATIVE
PH UA: 5 (ref 5.0–7.5)
Specific Gravity, UA: 1.025 (ref 1.005–1.030)
UUROB: 0.2 mg/dL (ref 0.2–1.0)

## 2017-05-14 MED ORDER — TAMSULOSIN HCL 0.4 MG PO CAPS: 0.4000 mg | ORAL_CAPSULE | Freq: Every day | ORAL | 1 refills | Status: DC | PRN

## 2017-05-14 MED ORDER — CEPHALEXIN 500 MG PO CAPS: 500.0000 mg | ORAL_CAPSULE | Freq: Four times a day (QID) | ORAL | 0 refills | Status: DC

## 2017-05-14 NOTE — Progress Notes (Signed)
BP (!) 177/84   Pulse 60   Temp (!) 97.1 F (36.2 C) (Oral)   Ht 5\' 6"  (1.676 m)   Wt 234 lb (106.1 kg)   BMI 37.77 kg/m    Subjective:    Patient ID: Andre Malone, male    DOB: 15-Aug-1951, 66 y.o.   MRN: [id number]  HPI: Andre Chenhomas G Doerner is a 66 y.o. male presenting on 05/14/2017 for Urinary Tract Infection (hematuria, dysuria, frequency; began 2 days ago)   HPI Blood in his urine and dysuria Patient comes in complaining of 2-3 days of hematuria and dysuria, but burning and irritation is near the end of his penis near his urethra. He denies any fevers or chills or flank pain or abdominal pain. He says that he feels more ill over the past couple days but does not know how to explain otherwise woody feeling. He thinks he has a bladder infection but does have an extensive history of renal stones that were uric acid in nature.  Relevant past medical, surgical, family and social history reviewed and updated as indicated. Interim medical history since our last visit reviewed. Allergies and medications reviewed and updated.  Review of Systems  Constitutional: Negative for chills and fever.  HENT: Negative for congestion and ear pain.   Respiratory: Negative for shortness of breath and wheezing.   Cardiovascular: Negative for chest pain and leg swelling.  Gastrointestinal: Negative for abdominal pain.  Genitourinary: Positive for dysuria, frequency, hematuria and urgency. Negative for decreased urine volume, flank pain and testicular pain.  Musculoskeletal: Negative for back pain and gait problem.  Skin: Negative for rash.  Neurological: Negative for dizziness, weakness, light-headedness and numbness.  All other systems reviewed and are negative.   Per HPI unless specifically indicated above        Objective:    BP (!) 177/84   Pulse 60   Temp (!) 97.1 F (36.2 C) (Oral)   Ht 5\' 6"  (1.676 m)   Wt 234 lb (106.1 kg)   BMI 37.77 kg/m   Wt Readings from Last 3  Encounters:  05/14/17 234 lb (106.1 kg)  05/10/17 235 lb (106.6 kg)  03/20/17 230 lb (104.3 kg)    Physical Exam  Constitutional: He is oriented to person, place, and time. He appears well-developed and well-nourished. No distress.  Eyes: Conjunctivae are normal. No scleral icterus.  Cardiovascular: Normal rate, regular rhythm, normal heart sounds and intact distal pulses.   No murmur heard. Pulmonary/Chest: Effort normal and breath sounds normal. No respiratory distress. He has no wheezes. He has no rales.  Abdominal: Soft. Bowel sounds are normal. He exhibits no distension. There is no hepatosplenomegaly. There is no tenderness. There is no rebound, no guarding and no CVA tenderness.  Musculoskeletal: Normal range of motion. He exhibits no edema.  Neurological: He is alert and oriented to person, place, and time. Coordination normal.  Skin: Skin is warm and dry. No rash noted. He is not diaphoretic.  Psychiatric: He has a normal mood and affect. His behavior is normal.  Nursing note and vitals reviewed.   Urinalysis: 0-5 WBCs, greater than 30 RBCs, 0-10 epithelial cells, crystals present, amorphous, 3+ blood and 2+ protein, otherwise negative    Assessment & Plan:   Problem List Items Addressed This Visit    None    Visit Diagnoses    Gross hematuria    -  Primary   Relevant Medications   cephALEXin (KEFLEX) 500 MG capsule   tamsulosin (  FLOMAX) 0.4 MG CAPS capsule   Other Relevant Orders   Urinalysis, Complete       Follow up plan: Return if symptoms worsen or fail to improve.  Counseling provided for all of the vaccine components Orders Placed This Encounter  Procedures  . Urinalysis, Complete    Arville Care, MD [facility] 05/14/2017, 10:53 AM

## 2017-05-15 ENCOUNTER — Telehealth: Payer: Self-pay | Admitting: Nurse Practitioner

## 2017-05-20 ENCOUNTER — Other Ambulatory Visit: Payer: Self-pay | Admitting: Nurse Practitioner

## 2017-05-21 LAB — CUP PACEART INCLINIC DEVICE CHECK
Implantable Lead Implant Date: 20100913
Implantable Lead Location: 753859
Implantable Pulse Generator Implant Date: 20150114
MDC IDC LEAD IMPLANT DT: 20150114
MDC IDC LEAD LOCATION: 753860
MDC IDC PG SERIAL: 7586540
MDC IDC SESS DTM: 20180917150852

## 2017-05-29 ENCOUNTER — Encounter: Payer: Self-pay | Admitting: Nurse Practitioner

## 2017-05-29 ENCOUNTER — Ambulatory Visit (INDEPENDENT_AMBULATORY_CARE_PROVIDER_SITE_OTHER): Payer: PPO

## 2017-05-29 ENCOUNTER — Ambulatory Visit (INDEPENDENT_AMBULATORY_CARE_PROVIDER_SITE_OTHER): Payer: PPO | Admitting: Nurse Practitioner

## 2017-05-29 VITALS — BP 154/86 | HR 88 | Temp 97.1°F | Ht 66.0 in | Wt 234.0 lb

## 2017-05-29 DIAGNOSIS — M25571 Pain in right ankle and joints of right foot: Secondary | ICD-10-CM

## 2017-05-29 DIAGNOSIS — M79671 Pain in right foot: Secondary | ICD-10-CM

## 2017-05-29 DIAGNOSIS — S90121A Contusion of right lesser toe(s) without damage to nail, initial encounter: Secondary | ICD-10-CM | POA: Diagnosis not present

## 2017-05-29 NOTE — Patient Instructions (Signed)
Contusion A contusion is a deep bruise. Contusions happen when an injury causes bleeding under the skin. Symptoms of bruising include pain, swelling, and discolored skin. The skin may turn blue, purple, or yellow. Follow these instructions at home:  Rest the injured area.  If told, put ice on the injured area. ? Put ice in a plastic bag. ? Place a towel between your skin and the bag. ? Leave the ice on for 20 minutes, 2-3 times per day.  If told, put light pressure (compression) on the injured area using an elastic bandage. Make sure the bandage is not too tight. Remove it and put it back on as told by your doctor.  If possible, raise (elevate) the injured area above the level of your heart while you are sitting or lying down.  Take over-the-counter and prescription medicines only as told by your doctor. Contact a doctor if:  Your symptoms do not get better after several days of treatment.  Your symptoms get worse.  You have trouble moving the injured area. Get help right away if:  You have very bad pain.  You have a loss of feeling (numbness) in a hand or foot.  Your hand or foot turns pale or cold. This information is not intended to replace advice given to you by your health care provider. Make sure you discuss any questions you have with your health care provider. Document Released: 02/07/2008 Document Revised: 01/27/2016 Document Reviewed: 01/06/2015 Elsevier Interactive Patient Education  2018 [employer]  

## 2017-05-29 NOTE — Progress Notes (Signed)
   Subjective:    Patient ID: Andre Malone, male    DOB: Nov 02, 1950, 66 y.o.   MRN: [id number]  HPI Patient comes in today stating that he hit his right fifth toe on something a week ago and it has been swollen and sore. this morning he was letting his dog out that weighs 150lbs and the dog stepped on that toe making it hurt.    Review of Systems  Constitutional: Negative.   Respiratory: Negative.   Cardiovascular: Negative.   Neurological: Negative.   Psychiatric/Behavioral: Negative.   All other systems reviewed and are negative.      Objective:   Physical Exam  Constitutional: He appears well-developed and well-nourished. No distress.  Cardiovascular: Normal rate.   Pulmonary/Chest: Effort normal.  Musculoskeletal:  Edematous tender right great toe  Neurological: He is alert.   BP (!) 154/86   Pulse 88   Temp (!) 97.1 F (36.2 C)   Ht  (1.676 m)   Wt 234 lb (106.1 kg)   BMI 37.77 kg/m   Xray- no toe fracture visible- [name] Daphine Deutscher, FNP      Assessment & Plan:   1. Right foot pain   2. Acute right ankle pain   3. Contusion of lesser toe of right foot without damage to nail, initial encounter    Soak in epsom salt BID Ice Elevate  RTO prn  [name] Daphine Deutscher, FNP

## 2017-06-12 ENCOUNTER — Ambulatory Visit: Payer: PPO

## 2017-06-19 ENCOUNTER — Encounter: Payer: PPO | Admitting: *Deleted

## 2017-06-20 DIAGNOSIS — Z79891 Long term (current) use of opiate analgesic: Secondary | ICD-10-CM | POA: Diagnosis not present

## 2017-06-20 DIAGNOSIS — M5412 Radiculopathy, cervical region: Secondary | ICD-10-CM | POA: Diagnosis not present

## 2017-06-20 DIAGNOSIS — G2581 Restless legs syndrome: Secondary | ICD-10-CM | POA: Diagnosis not present

## 2017-06-20 DIAGNOSIS — M25519 Pain in unspecified shoulder: Secondary | ICD-10-CM | POA: Diagnosis not present

## 2017-06-25 ENCOUNTER — Ambulatory Visit (INDEPENDENT_AMBULATORY_CARE_PROVIDER_SITE_OTHER): Payer: PPO | Admitting: *Deleted

## 2017-06-25 DIAGNOSIS — Z952 Presence of prosthetic heart valve: Secondary | ICD-10-CM

## 2017-06-25 DIAGNOSIS — I480 Paroxysmal atrial fibrillation: Secondary | ICD-10-CM | POA: Diagnosis not present

## 2017-06-25 DIAGNOSIS — Z23 Encounter for immunization: Secondary | ICD-10-CM

## 2017-06-25 LAB — COAGUCHEK XS/INR WAIVED
INR: 3.5 — AB (ref 0.9–1.1)
PROTHROMBIN TIME: 42 s

## 2017-06-25 NOTE — Addendum Note (Signed)
Addended by: Gwenith DailyHUDY, Loi Rennaker N on: 06/25/2017 03:54 PM   Modules accepted: Level of Service

## 2017-06-25 NOTE — Patient Instructions (Signed)
Anticoagulation Warfarin Dose Instructions as of 06/25/2017      Glynis SmilesSun Mon Tue Wed Thu Fri Sat   New Dose 5 mg 0 mg 2.5 mg 5 mg 5 mg 5 mg 5 mg   Alt Week 5 mg 2.5 mg 5 mg 5 mg 5 mg 5 mg 5 mg    Description   Do not take your dose today. Take 1/2 tablet tomorrow and then resume normal schedule of 1 tablet daily except for 1/2 tablet on Mondays.   Your INR was 3.5 today, which is too thin  Your goal is 2.5 - 3.0   Return Monday, Nov 5th at 10:30

## 2017-06-25 NOTE — Progress Notes (Signed)
Subjective:     Indication: atrial fibrillation Bleeding signs/symptoms: None Thromboembolic signs/symptoms: None  Missed Coumadin doses: None Medication changes: no Dietary changes: no Bacterial/viral infection: no Other concerns: no  The following portions of the patient's history were reviewed and updated as appropriate: allergies and current medications.  Review of Systems Pertinent items are noted in HPI.   Objective:    INR Today: 3.4 Current dose: 5mg  daily except for 2.5 mg on Monday  Assessment:    Supratherapeutic INR for goal of 2.5-3.0   Plan:    1. New dose: Hold today's dose and take 1/2 tablet tomorrow. Then resume normal schedule of 5mg  daily except for 2.5 on Mondays.    2. Next INR: 2 weeks    Discussed with Bennie PieriniMary Margaret Martin, FNP [name], FNP   Demetrios LollKristen Analaya Hoey, RN

## 2017-07-09 ENCOUNTER — Ambulatory Visit (INDEPENDENT_AMBULATORY_CARE_PROVIDER_SITE_OTHER): Payer: PPO | Admitting: Nurse Practitioner

## 2017-07-09 VITALS — BP 140/66 | HR 60 | Temp 96.9°F | Ht 66.0 in | Wt 242.0 lb

## 2017-07-09 DIAGNOSIS — I480 Paroxysmal atrial fibrillation: Secondary | ICD-10-CM | POA: Diagnosis not present

## 2017-07-09 DIAGNOSIS — Z952 Presence of prosthetic heart valve: Secondary | ICD-10-CM | POA: Diagnosis not present

## 2017-07-09 LAB — COAGUCHEK XS/INR WAIVED
INR: 2.8 — ABNORMAL HIGH (ref 0.9–1.1)
PROTHROMBIN TIME: 33.2 s

## 2017-07-09 LAB — PROTIME-INR: INR: 2.8 — AB (ref 0.9–1.1)

## 2017-07-09 NOTE — Progress Notes (Signed)
Subjective:   Patient in today for INR.   Indication: atrial fibrillation Bleeding signs/symptoms: None Thromboembolic signs/symptoms: None  Missed Coumadin doses: None Medication changes: no Dietary changes: no Bacterial/viral infection: no Other concerns: no  The following portions of the patient's history were reviewed and updated as appropriate: allergies, current medications, past family history, past medical history, past social history, past surgical history and problem list.  Review of Systems Pertinent items noted in HPI and remainder of comprehensive ROS otherwise negative.   Objective:    INR Today: 2.8 Current dose: coumadin 5 mg daily except 2.5mg  on mondays    Assessment:    Therapeutic INR for goal of 2.5-3.0   Plan:    1. New dose: no change   2. Next INR: 1 month    [name] Daphine Deutscher, FNP

## 2017-07-09 NOTE — Addendum Note (Signed)
Addended by: Bennie PieriniMARTIN, [name] on: 07/09/2017 11:12 AM   Modules accepted: Level of Service

## 2017-07-16 ENCOUNTER — Ambulatory Visit (INDEPENDENT_AMBULATORY_CARE_PROVIDER_SITE_OTHER): Payer: PPO | Admitting: *Deleted

## 2017-07-16 DIAGNOSIS — I442 Atrioventricular block, complete: Secondary | ICD-10-CM | POA: Diagnosis not present

## 2017-07-16 NOTE — Progress Notes (Signed)
Remote pacemaker transmission.   

## 2017-07-17 LAB — CUP PACEART REMOTE DEVICE CHECK
Battery Remaining Percentage: 95.5 %
Battery Voltage: 2.98 V
Brady Statistic AP VP Percent: 28 %
Brady Statistic AS VS Percent: 1 %
Brady Statistic RA Percent Paced: 26 %
Brady Statistic RV Percent Paced: 97 %
Implantable Lead Implant Date: 20100913
Implantable Lead Implant Date: 20150114
Implantable Pulse Generator Implant Date: 20150114
Lead Channel Impedance Value: 540 Ohm
Lead Channel Pacing Threshold Amplitude: 1 V
Lead Channel Pacing Threshold Pulse Width: 0.5 ms
Lead Channel Sensing Intrinsic Amplitude: 3.2 mV
Lead Channel Setting Pacing Amplitude: 2.5 V
MDC IDC LEAD LOCATION: 753859
MDC IDC LEAD LOCATION: 753860
MDC IDC MSMT BATTERY REMAINING LONGEVITY: 100 mo
MDC IDC MSMT LEADCHNL RA IMPEDANCE VALUE: 260 Ohm
MDC IDC MSMT LEADCHNL RA PACING THRESHOLD PULSEWIDTH: 0.7 ms
MDC IDC MSMT LEADCHNL RV PACING THRESHOLD AMPLITUDE: 0.75 V
MDC IDC MSMT LEADCHNL RV SENSING INTR AMPL: 11.4 mV
MDC IDC SESS DTM: 20181112070013
MDC IDC SET LEADCHNL RA PACING AMPLITUDE: 2 V
MDC IDC SET LEADCHNL RV PACING PULSEWIDTH: 0.5 ms
MDC IDC SET LEADCHNL RV SENSING SENSITIVITY: 4 mV
MDC IDC STAT BRADY AP VS PERCENT: 1 %
MDC IDC STAT BRADY AS VP PERCENT: 68 %
Pulse Gen Model: 2240
Pulse Gen Serial Number: 7586540

## 2017-07-20 ENCOUNTER — Encounter: Payer: Self-pay | Admitting: Cardiology

## 2017-08-03 ENCOUNTER — Other Ambulatory Visit: Payer: Self-pay

## 2017-08-03 DIAGNOSIS — I359 Nonrheumatic aortic valve disorder, unspecified: Secondary | ICD-10-CM

## 2017-08-03 DIAGNOSIS — F5101 Primary insomnia: Secondary | ICD-10-CM

## 2017-08-03 MED ORDER — WARFARIN SODIUM 5 MG PO TABS: ORAL_TABLET | ORAL | 0 refills | Status: DC

## 2017-08-03 MED ORDER — TRAZODONE HCL 100 MG PO TABS: ORAL_TABLET | ORAL | 0 refills | Status: DC

## 2017-08-10 ENCOUNTER — Emergency Department (HOSPITAL_COMMUNITY): Payer: PPO

## 2017-08-10 ENCOUNTER — Inpatient Hospital Stay (HOSPITAL_COMMUNITY)
Admission: EM | Admit: 2017-08-10 | Discharge: 2017-08-11 | DRG: 292 | Disposition: A | Discharge status: Home / Self Care | Payer: PPO | Attending: Internal Medicine | Admitting: Internal Medicine

## 2017-08-10 ENCOUNTER — Ambulatory Visit: Payer: PPO | Admitting: Nurse Practitioner

## 2017-08-10 ENCOUNTER — Encounter: Payer: Self-pay | Admitting: Nurse Practitioner

## 2017-08-10 ENCOUNTER — Encounter (HOSPITAL_COMMUNITY): Payer: Self-pay

## 2017-08-10 ENCOUNTER — Other Ambulatory Visit: Payer: Self-pay

## 2017-08-10 VITALS — BP 179/89 | HR 60 | Temp 96.6°F | Ht 66.0 in | Wt 239.0 lb

## 2017-08-10 DIAGNOSIS — G2581 Restless legs syndrome: Secondary | ICD-10-CM | POA: Diagnosis present

## 2017-08-10 DIAGNOSIS — I152 Hypertension secondary to endocrine disorders: Secondary | ICD-10-CM | POA: Diagnosis present

## 2017-08-10 DIAGNOSIS — Y712 Prosthetic and other implants, materials and accessory cardiovascular devices associated with adverse incidents: Secondary | ICD-10-CM | POA: Diagnosis present

## 2017-08-10 DIAGNOSIS — Z96642 Presence of left artificial hip joint: Secondary | ICD-10-CM | POA: Diagnosis not present

## 2017-08-10 DIAGNOSIS — I252 Old myocardial infarction: Secondary | ICD-10-CM

## 2017-08-10 DIAGNOSIS — Z794 Long term (current) use of insulin: Secondary | ICD-10-CM

## 2017-08-10 DIAGNOSIS — I481 Persistent atrial fibrillation: Secondary | ICD-10-CM

## 2017-08-10 DIAGNOSIS — Z950 Presence of cardiac pacemaker: Secondary | ICD-10-CM | POA: Diagnosis present

## 2017-08-10 DIAGNOSIS — I110 Hypertensive heart disease with heart failure: Secondary | ICD-10-CM | POA: Diagnosis not present

## 2017-08-10 DIAGNOSIS — R0602 Shortness of breath: Secondary | ICD-10-CM

## 2017-08-10 DIAGNOSIS — I480 Paroxysmal atrial fibrillation: Secondary | ICD-10-CM | POA: Diagnosis not present

## 2017-08-10 DIAGNOSIS — I10 Essential (primary) hypertension: Secondary | ICD-10-CM | POA: Diagnosis present

## 2017-08-10 DIAGNOSIS — Z8249 Family history of ischemic heart disease and other diseases of the circulatory system: Secondary | ICD-10-CM

## 2017-08-10 DIAGNOSIS — E039 Hypothyroidism, unspecified: Secondary | ICD-10-CM | POA: Diagnosis not present

## 2017-08-10 DIAGNOSIS — I5033 Acute on chronic diastolic (congestive) heart failure: Secondary | ICD-10-CM

## 2017-08-10 DIAGNOSIS — E785 Hyperlipidemia, unspecified: Secondary | ICD-10-CM

## 2017-08-10 DIAGNOSIS — E1140 Type 2 diabetes mellitus with diabetic neuropathy, unspecified: Secondary | ICD-10-CM

## 2017-08-10 DIAGNOSIS — Z7901 Long term (current) use of anticoagulants: Secondary | ICD-10-CM | POA: Diagnosis not present

## 2017-08-10 DIAGNOSIS — H5440 Blindness, one eye, unspecified eye: Secondary | ICD-10-CM | POA: Diagnosis present

## 2017-08-10 DIAGNOSIS — Z823 Family history of stroke: Secondary | ICD-10-CM

## 2017-08-10 DIAGNOSIS — I2510 Atherosclerotic heart disease of native coronary artery without angina pectoris: Secondary | ICD-10-CM | POA: Diagnosis not present

## 2017-08-10 DIAGNOSIS — E1159 Type 2 diabetes mellitus with other circulatory complications: Secondary | ICD-10-CM | POA: Diagnosis present

## 2017-08-10 DIAGNOSIS — R079 Chest pain, unspecified: Secondary | ICD-10-CM

## 2017-08-10 DIAGNOSIS — R069 Unspecified abnormalities of breathing: Secondary | ICD-10-CM | POA: Diagnosis not present

## 2017-08-10 DIAGNOSIS — Z952 Presence of prosthetic heart valve: Secondary | ICD-10-CM | POA: Diagnosis not present

## 2017-08-10 DIAGNOSIS — I442 Atrioventricular block, complete: Secondary | ICD-10-CM | POA: Diagnosis present

## 2017-08-10 DIAGNOSIS — I5043 Acute on chronic combined systolic (congestive) and diastolic (congestive) heart failure: Secondary | ICD-10-CM | POA: Diagnosis present

## 2017-08-10 DIAGNOSIS — I4891 Unspecified atrial fibrillation: Secondary | ICD-10-CM

## 2017-08-10 DIAGNOSIS — T82110A Breakdown (mechanical) of cardiac electrode, initial encounter: Secondary | ICD-10-CM | POA: Diagnosis present

## 2017-08-10 DIAGNOSIS — I5023 Acute on chronic systolic (congestive) heart failure: Secondary | ICD-10-CM | POA: Diagnosis present

## 2017-08-10 LAB — CBC WITH DIFFERENTIAL/PLATELET
Basophils Absolute: 0 10*3/uL (ref 0.0–0.1)
Basophils Relative: 0 %
Eosinophils Absolute: 0.3 10*3/uL (ref 0.0–0.7)
Eosinophils Relative: 3 %
HCT: 40.1 % (ref 39.0–52.0)
Hemoglobin: 13.1 g/dL (ref 13.0–17.0)
Lymphocytes Relative: 15 %
Lymphs Abs: 1.5 10*3/uL (ref 0.7–4.0)
MCH: 29.2 pg (ref 26.0–34.0)
MCHC: 32.7 g/dL (ref 30.0–36.0)
MCV: 89.3 fL (ref 78.0–100.0)
Monocytes Absolute: 0.6 10*3/uL (ref 0.1–1.0)
Monocytes Relative: 6 %
Neutro Abs: 7.9 10*3/uL — ABNORMAL HIGH (ref 1.7–7.7)
Neutrophils Relative %: 76 %
Platelets: 200 10*3/uL (ref 150–400)
RBC: 4.49 MIL/uL (ref 4.22–5.81)
RDW: 16.7 % — ABNORMAL HIGH (ref 11.5–15.5)
WBC: 10.3 10*3/uL (ref 4.0–10.5)

## 2017-08-10 LAB — BASIC METABOLIC PANEL
Anion gap: 8 (ref 5–15)
BUN: 9 mg/dL (ref 6–20)
CO2: 26 mmol/L (ref 22–32)
Calcium: 9.2 mg/dL (ref 8.9–10.3)
Chloride: 102 mmol/L (ref 101–111)
Creatinine, Ser: 0.97 mg/dL (ref 0.61–1.24)
GFR calc Af Amer: >60 mL/min (ref >60)
GFR calc non Af Amer: >60 mL/min (ref >60)
Glucose, Bld: 107 mg/dL — ABNORMAL HIGH (ref 65–99)
Potassium: 3.8 mmol/L (ref 3.5–5.1)
Sodium: 136 mmol/L (ref 135–145)

## 2017-08-10 LAB — PROTIME-INR
INR: 2.32
Prothrombin Time: 25.3 seconds — ABNORMAL HIGH (ref 11.4–15.2)

## 2017-08-10 LAB — I-STAT TROPONIN, ED: Troponin i, poc: 0.03 ng/mL (ref 0.00–0.08)

## 2017-08-10 LAB — GLUCOSE, CAPILLARY: Glucose-Capillary: 161 mg/dL — ABNORMAL HIGH (ref 65–99)

## 2017-08-10 LAB — COAGUCHEK XS/INR WAIVED
INR: 3.2 — ABNORMAL HIGH (ref 0.9–1.1)
PROTHROMBIN TIME: 39 s

## 2017-08-10 LAB — CBG MONITORING, ED: Glucose-Capillary: 97 mg/dL (ref 65–99)

## 2017-08-10 LAB — BAYER DCA HB A1C WAIVED: HB A1C: 6.3 % (ref <7.0)

## 2017-08-10 LAB — BRAIN NATRIURETIC PEPTIDE: B Natriuretic Peptide: 443.7 pg/mL — ABNORMAL HIGH (ref 0.0–100.0)

## 2017-08-10 MED ORDER — INSULIN ASPART 100 UNIT/ML ~~LOC~~ SOLN
0.0000 [IU] | Freq: Three times a day (TID) | SUBCUTANEOUS | Status: DC
  Administered 2017-08-11: 3 [IU] via SUBCUTANEOUS

## 2017-08-10 MED ORDER — FUROSEMIDE 10 MG/ML IJ SOLN
40.0000 mg | Freq: Two times a day (BID) | INTRAMUSCULAR | Status: DC
  Administered 2017-08-10 – 2017-08-11 (×2): 40 mg via INTRAVENOUS
  Filled 2017-08-10 (×2): qty 4

## 2017-08-10 MED ORDER — OXYCODONE HCL 5 MG PO TABS
15.0000 mg | ORAL_TABLET | Freq: Once | ORAL | Status: AC
  Administered 2017-08-10: 15 mg via ORAL
  Filled 2017-08-10: qty 3

## 2017-08-10 MED ORDER — WARFARIN SODIUM 2 MG PO TABS
2.0000 mg | ORAL_TABLET | Freq: Once | ORAL | Status: AC
  Administered 2017-08-10: 2 mg via ORAL
  Filled 2017-08-10: qty 1

## 2017-08-10 MED ORDER — AMIODARONE HCL 200 MG PO TABS
200.0000 mg | ORAL_TABLET | Freq: Two times a day (BID) | ORAL | Status: DC
  Administered 2017-08-10 – 2017-08-11 (×2): 200 mg via ORAL
  Filled 2017-08-10 (×3): qty 1

## 2017-08-10 MED ORDER — PREGABALIN 75 MG PO CAPS: 75.0000 mg | ORAL_CAPSULE | Freq: Two times a day (BID) | ORAL | Status: DC | PRN

## 2017-08-10 MED ORDER — WARFARIN - PHARMACIST DOSING INPATIENT: Freq: Every day | Status: DC

## 2017-08-10 MED ORDER — TRAZODONE HCL 100 MG PO TABS: 100.0000 mg | ORAL_TABLET | Freq: Every evening | ORAL | Status: DC | PRN

## 2017-08-10 MED ORDER — FUROSEMIDE 10 MG/ML IJ SOLN
40.0000 mg | Freq: Once | INTRAMUSCULAR | Status: AC
  Administered 2017-08-10: 40 mg via INTRAVENOUS
  Filled 2017-08-10: qty 4

## 2017-08-10 MED ORDER — ONDANSETRON HCL 4 MG/2ML IJ SOLN: 4.0000 mg | Freq: Four times a day (QID) | INTRAMUSCULAR | Status: DC | PRN

## 2017-08-10 MED ORDER — PRAMIPEXOLE DIHYDROCHLORIDE 1.5 MG PO TABS
1.5000 mg | ORAL_TABLET | Freq: Every day | ORAL | Status: DC
  Administered 2017-08-10: 1.5 mg via ORAL
  Filled 2017-08-10 (×2): qty 1

## 2017-08-10 MED ORDER — OXYCODONE HCL 5 MG PO TABS
15.0000 mg | ORAL_TABLET | Freq: Four times a day (QID) | ORAL | Status: DC | PRN
  Administered 2017-08-10 – 2017-08-11 (×4): 15 mg via ORAL
  Filled 2017-08-10 (×4): qty 3

## 2017-08-10 MED ORDER — LEVOTHYROXINE SODIUM 88 MCG PO TABS
88.0000 ug | ORAL_TABLET | Freq: Every day | ORAL | Status: DC
  Administered 2017-08-11: 88 ug via ORAL
  Filled 2017-08-10: qty 1

## 2017-08-10 MED ORDER — ASPIRIN EC 81 MG PO TBEC
81.0000 mg | DELAYED_RELEASE_TABLET | Freq: Every day | ORAL | Status: DC
  Administered 2017-08-11: 81 mg via ORAL
  Filled 2017-08-10: qty 1

## 2017-08-10 MED ORDER — INSULIN GLARGINE 100 UNIT/ML ~~LOC~~ SOLN
40.0000 [IU] | Freq: Every day | SUBCUTANEOUS | Status: DC
  Administered 2017-08-10: 40 [IU] via SUBCUTANEOUS
  Filled 2017-08-10 (×2): qty 0.4

## 2017-08-10 MED ORDER — PRAVASTATIN SODIUM 40 MG PO TABS
40.0000 mg | ORAL_TABLET | Freq: Every day | ORAL | Status: DC
  Administered 2017-08-10: 40 mg via ORAL
  Filled 2017-08-10: qty 1

## 2017-08-10 MED ORDER — SERTRALINE HCL 100 MG PO TABS
100.0000 mg | ORAL_TABLET | Freq: Every day | ORAL | Status: DC
  Administered 2017-08-11: 100 mg via ORAL
  Filled 2017-08-10: qty 1

## 2017-08-10 MED ORDER — METFORMIN HCL 500 MG PO TABS
1000.0000 mg | ORAL_TABLET | Freq: Two times a day (BID) | ORAL | Status: DC
  Administered 2017-08-11: 1000 mg via ORAL
  Filled 2017-08-10 (×2): qty 2

## 2017-08-10 MED ORDER — ACETAMINOPHEN 325 MG PO TABS: 650.0000 mg | ORAL_TABLET | ORAL | Status: DC | PRN
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
## 2017-08-10 NOTE — H&P (Signed)
Please see note labled consult from today  Francis Dowseenee ursuy, PA-C  Leonia ReevesGregg Mayci Haning,M.D.
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
## 2017-08-10 NOTE — ED Triage Notes (Signed)
[facility] and [facility] work.  Due to pt's SOB they called EMS for emergency evaluation.  Pt reports his "pacemaker leads are just not good." SOB has increased for 2 weeks.  Pt reports worsening symptoms with lying flat and increased activity.
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
## 2017-08-10 NOTE — Progress Notes (Signed)
   Subjective:    Patient ID: Andre Malone, male    DOB: Sep 27, 1950, 66 y.o.   MRN: [id number]  HPI Patient was scheduled today for a 3 month check up but he is not feeling well. He says that he has been having chest pain and extreme dyspnea for about 2 weeks. Says that he gets short of breath with the least of activity. He currently has a heavy feeling in chest and feels like he cannot catch his breath after walking form lab to exam room.    Review of Systems  Respiratory: Positive for shortness of breath.   Cardiovascular: Positive for chest pain.       Objective:   Physical Exam  Constitutional: He appears well-developed and well-nourished. He appears distressed (mild).  Cardiovascular: Normal rate.  Clicking of heart valve  Pulmonary/Chest: Breath sounds normal.    ekg- possible st elevation in v1 and 2. Atrial fib with slow ventricular response. BP (!) 179/89   Pulse 60   Temp (!) 96.6 F (35.9 C) (Oral)   Ht 5\' 6"  (1.676 m)   Wt 239 lb (108.4 kg)   SpO2 97%   BMI 38.58 kg/m   INR today was 3.2  o2 via nasal cannula at 2 liters     Assessment & Plan:   1. Paroxysmal atrial fibrillation (HCC)   2. Type 2 diabetes mellitus with diabetic neuropathy, with long-term current use of insulin (HCC)   3. Hyperlipidemia with target LDL less than 100   4. Hypothyroidism, unspecified type   5. Chest pain, unspecified type    Transported to [facility] via [facility]- report given  [name], FNP

## 2017-08-10 NOTE — ED Notes (Signed)
Cardiology returned to beside.

## 2017-08-10 NOTE — ED Notes (Signed)
[employer] rep at bedside to interrogate A-lead.  Report showed possible underlying a-fib.

## 2017-08-11 ENCOUNTER — Encounter (HOSPITAL_COMMUNITY): Payer: Self-pay | Admitting: Cardiology

## 2017-08-11 DIAGNOSIS — R0602 Shortness of breath: Secondary | ICD-10-CM | POA: Diagnosis not present

## 2017-08-11 DIAGNOSIS — T82110A Breakdown (mechanical) of cardiac electrode, initial encounter: Secondary | ICD-10-CM | POA: Diagnosis present

## 2017-08-11 DIAGNOSIS — Z7901 Long term (current) use of anticoagulants: Secondary | ICD-10-CM

## 2017-08-11 DIAGNOSIS — I5033 Acute on chronic diastolic (congestive) heart failure: Secondary | ICD-10-CM | POA: Diagnosis not present

## 2017-08-11 DIAGNOSIS — I5023 Acute on chronic systolic (congestive) heart failure: Secondary | ICD-10-CM | POA: Diagnosis present

## 2017-08-11 DIAGNOSIS — I481 Persistent atrial fibrillation: Secondary | ICD-10-CM | POA: Diagnosis not present

## 2017-08-11 DIAGNOSIS — I110 Hypertensive heart disease with heart failure: Secondary | ICD-10-CM | POA: Diagnosis not present

## 2017-08-11 LAB — CMP14+EGFR
ALT: 16 IU/L (ref 0–44)
AST: 23 IU/L (ref 0–40)
Albumin/Globulin Ratio: 1.3 (ref 1.2–2.2)
Albumin: 4 g/dL (ref 3.6–4.8)
Alkaline Phosphatase: 74 IU/L (ref 39–117)
BUN/Creatinine Ratio: 11 (ref 10–24)
BUN: 11 mg/dL (ref 8–27)
Bilirubin Total: 0.6 mg/dL (ref 0.0–1.2)
CALCIUM: 9.2 mg/dL (ref 8.6–10.2)
CO2: 25 mmol/L (ref 20–29)
CREATININE: 1.01 mg/dL (ref 0.76–1.27)
Chloride: 102 mmol/L (ref 96–106)
GFR calc Af Amer: 89 mL/min/{1.73_m2} (ref >59)
GFR, EST NON AFRICAN AMERICAN: 77 mL/min/{1.73_m2} (ref >59)
Globulin, Total: 3 g/dL (ref 1.5–4.5)
Glucose: 189 mg/dL — ABNORMAL HIGH (ref 65–99)
Potassium: 4.1 mmol/L (ref 3.5–5.2)
Sodium: 139 mmol/L (ref 134–144)
TOTAL PROTEIN: 7 g/dL (ref 6.0–8.5)

## 2017-08-11 LAB — BASIC METABOLIC PANEL
Anion gap: 11 (ref 5–15)
BUN: 13 mg/dL (ref 6–20)
CALCIUM: 9.5 mg/dL (ref 8.9–10.3)
CHLORIDE: 98 mmol/L — AB (ref 101–111)
CO2: 29 mmol/L (ref 22–32)
CREATININE: 1.18 mg/dL (ref 0.61–1.24)
GFR calc non Af Amer: >60 mL/min (ref >60)
GLUCOSE: 107 mg/dL — AB (ref 65–99)
Potassium: 3.7 mmol/L (ref 3.5–5.1)
Sodium: 138 mmol/L (ref 135–145)

## 2017-08-11 LAB — THYROID PANEL WITH TSH
FREE THYROXINE INDEX: 1.3 (ref 1.2–4.9)
T3 Uptake Ratio: 26 % (ref 24–39)
T4, Total: 5 ug/dL (ref 4.5–12.0)
TSH: 6.46 u[IU]/mL — ABNORMAL HIGH (ref 0.450–4.500)

## 2017-08-11 LAB — GLUCOSE, CAPILLARY
GLUCOSE-CAPILLARY: 163 mg/dL — AB (ref 65–99)
GLUCOSE-CAPILLARY: 113 mg/dL — AB (ref 65–99)
GLUCOSE-CAPILLARY: 127 mg/dL — AB (ref 65–99)
Glucose-Capillary: 113 mg/dL — ABNORMAL HIGH (ref 65–99)

## 2017-08-11 LAB — LIPID PANEL
CHOL/HDL RATIO: 4.2 ratio (ref 0.0–5.0)
Cholesterol, Total: 161 mg/dL (ref 100–199)
HDL: 38 mg/dL — AB (ref >39)
LDL CALC: 91 mg/dL (ref 0–99)
TRIGLYCERIDES: 161 mg/dL — AB (ref 0–149)
VLDL CHOLESTEROL CAL: 32 mg/dL (ref 5–40)

## 2017-08-11 LAB — PROTIME-INR
INR: 2.05
Prothrombin Time: 23 seconds — ABNORMAL HIGH (ref 11.4–15.2)

## 2017-08-11 MED ORDER — FUROSEMIDE 40 MG PO TABS: 40.0000 mg | ORAL_TABLET | Freq: Every day | ORAL | 11 refills | Status: DC

## 2017-08-11 MED ORDER — ACETAMINOPHEN 325 MG PO TABS: 650.0000 mg | ORAL_TABLET | ORAL | Status: DC | PRN

## 2017-08-11 MED ORDER — AMIODARONE HCL 200 MG PO TABS: 200.0000 mg | ORAL_TABLET | Freq: Two times a day (BID) | ORAL | 4 refills | Status: DC

## 2017-08-11 MED ORDER — POTASSIUM CHLORIDE ER 20 MEQ PO TBCR: 20.0000 meq | EXTENDED_RELEASE_TABLET | Freq: Every day | ORAL | 11 refills | Status: DC

## 2017-08-11 MED ORDER — WARFARIN SODIUM 5 MG PO TABS
5.0000 mg | ORAL_TABLET | Freq: Once | ORAL | Status: AC
  Administered 2017-08-11: 5 mg via ORAL
  Filled 2017-08-11: qty 1

## 2017-08-11 NOTE — Discharge Summary (Signed)
Patient ID: Andre Malone,  MRN: [id number], DOB/AGE: 01/07/1951 66 y.o.  Admit date: 08/10/2017 Discharge date: 08/11/2017  Primary Care Provider: Bennie PieriniMartin, [name], FNP Primary Cardiologist: Dr Klein/ Dr Sanjuana KavaMcAlhaney  Discharge Diagnoses Principal Problem:   Acute on chronic systolic heart failure [facility](HCC) Active Problems:   Type 2 diabetes mellitus with diabetic neuropathy, with long-term current use of insulin (HCC)   S/P AVR (aortic valve replacement)   HTN (hypertension)   Pacemaker  [employer]   RLS (restless legs syndrome)   Persistent atrial fibrillation (HCC)   Pacemaker lead failure   Chronic anticoagulation    Procedures: none   Hospital Course:  66 y.o. male with a hx of severe AS, (bicuspid) and ascending aneurysm - s/p AVR (mechanical)/condiut in 2012 (complicated by large pericardial effusion had pericardial window). Other medical problems include  HTN, CAD with NSTEMI in 2014 treated medically with target vessel too small, CHB with PPM (has had gen change and RV lead revision in 2015) with known RA lead noise/failure, HLD, and IDDM. He was seen in the office 08/10/17 the evaluation of SOB and abnormal pacer findings at the request of R. Little, PA.  The pt was felt to have acute on chronic diastolic CHF. He was admitted and diuresed 12 lbs in 24 hrs. Dr Ladona Ridgelaylor felt he could be discharged on PO Lasix. He has known atrial lead undersensing and at some point will need a pacemaker revision. Amiodarone was started for atrial fibrillation, this will have to be adjusted as an OP as well as his Coumadin. Dr Ladona Ridgelaylor also suggest a f/u echo, we may want to wait on this till he is in NSR. The pt will need an INR next week and an office visit with Dr Graciela HusbandsKlein or an APP in two weeks.     Discharge Vitals:  Blood pressure 125/82, pulse 60, temperature 98 F (36.7 C), temperature source Oral, resp. rate 16, height 5' 6.5" (1.689 m), weight 226 lb 1.6 oz (102.6 kg), SpO2 96 %.     Labs: Results for orders placed or performed during the hospital encounter of 08/10/17 (from the past 24 hour(s))  Glucose, capillary     Status: Abnormal   Collection Time: 08/10/17  9:19 PM  Result Value Ref Range   Glucose-Capillary 161 (H) 65 - 99 mg/dL   Comment 1 Notify RN    Comment 2 Document in Chart   Glucose, capillary     Status: Abnormal   Collection Time: 08/11/17  3:36 AM  Result Value Ref Range   Glucose-Capillary 113 (H) 65 - 99 mg/dL   Comment 1 Notify RN    Comment 2 Document in Chart   Basic metabolic panel     Status: Abnormal   Collection Time: 08/11/17  3:41 AM  Result Value Ref Range   Sodium 138 135 - 145 mmol/L   Potassium 3.7 3.5 - 5.1 mmol/L   Chloride 98 (L) 101 - 111 mmol/L   CO2 29 22 - 32 mmol/L   Glucose, Bld 107 (H) 65 - 99 mg/dL   BUN 13 6 - 20 mg/dL   Creatinine, Ser 4.091.18 0.61 - 1.24 mg/dL   Calcium 9.5 8.9 - 81.110.3 mg/dL   GFR calc non Af Amer >60 >60 mL/min   GFR calc Af Amer >60 >60 mL/min   Anion gap 11 5 - 15  Protime-INR     Status: Abnormal   Collection Time: 08/11/17  3:41 AM  Result Value Ref  Range   Prothrombin Time 23.0 (H) 11.4 - 15.2 seconds   INR 2.05   Glucose, capillary     Status: Abnormal   Collection Time: 08/11/17  8:20 AM  Result Value Ref Range   Glucose-Capillary 163 (H) 65 - 99 mg/dL  Glucose, capillary     Status: Abnormal   Collection Time: 08/11/17 11:59 AM  Result Value Ref Range   Glucose-Capillary 113 (H) 65 - 99 mg/dL  Glucose, capillary     Status: Abnormal   Collection Time: 08/11/17  4:55 PM  Result Value Ref Range   Glucose-Capillary 127 (H) 65 - 99 mg/dL    Disposition:  Follow-up Information    Duke SalviaKlein, Steven C, MD Follow up.   Specialty:  Cardiology Why:  office will contact you Contact information: [address] [place] [address] [phone]           Discharge Medications:  Allergies as of 08/11/2017      Reactions   Apresoline [hydralazine] Nausea And  Vomiting   Ace Inhibitors Other (See Comments)   cough   Benadryl [diphenhydramine Hcl] Other (See Comments)   Makes restless legs worse   Diphenhydramine Hcl Other (See Comments)   Makes restless legs worse   Imdur [isosorbide Dinitrate] Other (See Comments)   headache   Metoprolol Other (See Comments)   Kidney failure   Nsaids Other (See Comments)   REACTION: Currently taking Coumadin   Valsartan Other (See Comments)   REACTION:shuts down Kidney      Medication List    TAKE these medications   acetaminophen 325 MG tablet Commonly known as:  TYLENOL Take 2 tablets (650 mg total) by mouth every 4 (four) hours as needed for headache or mild pain.   amiodarone 200 MG tablet Commonly known as:  PACERONE Take 1 tablet (200 mg total) by mouth 2 (two) times daily.   aspirin 81 MG EC tablet Take 1 tablet (81 mg total) by mouth daily.   furosemide 40 MG tablet Commonly known as:  LASIX Take 1 tablet (40 mg total) by mouth daily.   insulin glargine 100 UNIT/ML injection Commonly known as:  LANTUS Inject 0.4 mLs (40 Units total) into the skin daily after supper.   levothyroxine 88 MCG tablet Commonly known as:  SYNTHROID, LEVOTHROID Take 1 tablet (88 mcg total) by mouth daily.   LYRICA 75 MG capsule Generic drug:  pregabalin Take 75 mg by mouth daily as needed (burning sensation in feet).   metFORMIN 1000 MG tablet Commonly known as:  GLUCOPHAGE TAKE 1 TABLET (1,000 MG TOTAL) BY MOUTH 2 (TWO) TIMES DAILY WITH A MEAL.   multivitamin with minerals Tabs tablet Take 1 tablet by mouth daily.   oxyCODONE 15 MG immediate release tablet Commonly known as:  ROXICODONE Take 15 mg by mouth every 6 (six) hours as needed for pain.   Potassium Chloride ER 20 MEQ Tbcr Take 20 mEq by mouth daily.   pramipexole 1.5 MG tablet Commonly known as:  MIRAPEX Take 1 tablet (1.5 mg total) by mouth at bedtime. What changed:    when to take this  reasons to take this   pravastatin 40  MG tablet Commonly known as:  PRAVACHOL TAKE 1 TABLET (40 MG TOTAL) BY MOUTH EVERY EVENING. FOR CHOLESTEROL   sertraline 100 MG tablet Commonly known as:  ZOLOFT Take 1 tablet (100 mg total) by mouth daily.   tamsulosin 0.4 MG Caps capsule Commonly known as:  FLOMAX Take 1  capsule (0.4 mg total) by mouth daily as needed. Reported on 01/05/2016   traMADol 50 MG tablet Commonly known as:  ULTRAM Take 50 mg by mouth as needed for moderate pain.   traZODone 100 MG tablet Commonly known as:  DESYREL TAKE ONE TABLET BY MOUTH AT BEDTIME   warfarin 5 MG tablet Commonly known as:  COUMADIN Take as directed. If you are unsure how to take this medication, talk to your nurse or doctor. Original instructions:  Take 1/2 tablet on Monday and Thursday and then take 1 tablet all there other days        Duration of Discharge Encounter: Greater than 30 minutes including physician time.  Jolene Provost PA-C 08/11/2017 5:06 PM   EP attending  Patient seen and examined.  Agree with the findings as noted above.  He is stable for discharge.  Usual follow-up.  Sharrell Ku, MD
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
## 2017-08-11 NOTE — Progress Notes (Signed)
Discharge instructions reviewed with patient, patient with no complaints of chest pain or pressure at discharge, Graciela HusbandsKlein MD office is to follow up with patient, patient instructed prescriptions have been called in to pharmacy,questions and concerns answered Stanford BreedBracey, Oleta Gunnoe N RN 6:00 PM 08-11-2017

## 2017-08-11 NOTE — Discharge Instructions (Signed)
Have your INR checked next week when the office opens, it probably won't be Monday.

## 2017-08-11 NOTE — Progress Notes (Signed)
Progress Note  Patient Name: Andre Malone Date of Encounter: 08/11/2017  Primary Cardiologist: Graciela HusbandsKlein   Subjective   Dyspnea and swelling are much improved this morning.  Denies chest pain or shortness of breath.  Inpatient Medications    Scheduled Meds: . amiodarone  200 mg Oral BID  . aspirin EC  81 mg Oral Daily  . furosemide  40 mg Intravenous BID  . insulin aspart  0-15 Units Subcutaneous TID WC  . insulin glargine  40 Units Subcutaneous QPC supper  . levothyroxine  88 mcg Oral Daily  . metFORMIN  1,000 mg Oral BID WC  . pramipexole  1.5 mg Oral QHS  . pravastatin  40 mg Oral q1800  . sertraline  100 mg Oral Daily  . Warfarin - Pharmacist Dosing Inpatient   Does not apply q1800   Continuous Infusions:  PRN Meds: acetaminophen, ondansetron (ZOFRAN) IV, oxyCODONE, pregabalin, traZODone   Vital Signs    Vitals:   08/11/17 0005 08/11/17 0438 08/11/17 0821 08/11/17 0823  BP: 134/66 110/69 (!) 97/44 (!) 117/48  Pulse: 60 60 (!) 59   Resp: 18 18 18    Temp: 98.4 F (36.9 C) 97.8 F (36.6 C) 99.1 F (37.3 C)   TempSrc: Oral Oral Oral   SpO2: 93% 95% 95%   Weight:  226 lb 1.6 oz (102.6 kg)    Height:        Intake/Output Summary (Last 24 hours) at 08/11/2017 0956 Last data filed at 08/11/2017 0439 Gross per 24 hour  Intake 240 ml  Output 2300 ml  Net -2060 ml   Filed Weights   08/10/17 1216 08/10/17 1630 08/11/17 0438  Weight: 238 lb (108 kg) 232 lb 3.2 oz (105.3 kg) 226 lb 1.6 oz (102.6 kg)    Telemetry    Atrial fibrillation with ventricular pacing- Personally Reviewed  ECG    Atrial fibrillation with ventricular pacing- Personally Reviewed  Physical Exam   GEN: No acute distress.   Neck: No JVD Cardiac: RRR, no murmurs, rubs, or gallops.  Respiratory: Clear to auscultation bilaterally except for minimal rales in the right base. GI: Soft, nontender, non-distended  MS: No edema; No deformity. Neuro:  Nonfocal  Psych: Normal affect   Labs      Chemistry Recent Labs  Lab 08/10/17 1031 08/10/17 1217 08/11/17 0341  NA 139 136 138  K 4.1 3.8 3.7  CL 102 102 98*  CO2 25 26 29   GLUCOSE 189* 107* 107*  BUN 11 9 13   CREATININE 1.01 0.97 1.18  CALCIUM 9.2 9.2 9.5  PROT 7.0  --   --   ALBUMIN 4.0  --   --   AST 23  --   --   ALT 16  --   --   ALKPHOS 74  --   --   BILITOT 0.6  --   --   GFRNONAA 77 >60 >60  GFRAA 89 >60 >60  ANIONGAP  --  8 11     Hematology Recent Labs  Lab 08/10/17 1217  WBC 10.3  RBC 4.49  HGB 13.1  HCT 40.1  MCV 89.3  MCH 29.2  MCHC 32.7  RDW 16.7*  PLT 200    Cardiac EnzymesNo results for input(s): TROPONINI in the last 168 hours.  Recent Labs  Lab 08/10/17 1243  TROPIPOC 0.03     BNP Recent Labs  Lab 08/10/17 1217  BNP 443.7*     DDimer No results for input(s): DDIMER in the  last 168 hours.   Radiology    Dg Chest 2 View  Result Date: 08/10/2017 CLINICAL DATA:  Shortness of breath and chest heaviness EXAM: CHEST  2 VIEW COMPARISON:  September 18, 2013 FINDINGS: Pacemaker leads are attached the right atrium and right ventricle. There is an aortic valve replacement. Heart is mildly enlarged with pulmonary vascularity within normal limits. There is aortic atherosclerosis. There is mild interstitial prominence. No pleural effusion is evident. No airspace consolidation. No adenopathy appreciable. No evident bone lesions. IMPRESSION: Suspect mild interstitial edema. No airspace consolidation. There is mild cardiomegaly with the pulmonary vascularity appearing within normal limits. Postoperative changes as noted. There is aortic atherosclerosis. Aortic Atherosclerosis (ICD10-I70.0). Electronically Signed   By: Bretta BangWilliam  Woodruff III M.D.   On: 08/10/2017 13:56    Cardiac Studies   None  Patient Profile     66 y.o. male admitted with acute on chronic diastolic heart failure and volume overload, in conjunction with atrial fibrillation in conjunction with atrial lead  dysfunction  Assessment & Plan    1.  Acute on chronic diastolic heart failure -he is undergone diuresis and his weight is down 12 pounds.  His peripheral edema has resolved, his rales are essentially resolved, and his abdominal distention is probably back to baseline.  I discussed the importance of a low-sodium diet.  He will need to be discharged on Lasix 60 mg daily, along with potassium 20 mEq daily.  He will continue his prior outpatient medications.  He is instructed to weigh himself every day and record the weight. 2.  Pacemaker -he has evidence of atrial undersensing and oversensing.  He will likely need atrial lead revision and this will be handled as an outpatient.  Because he is in atrial fibrillation, he is not pacing in the atrium as his device has been reprogrammed. 3.  Atrial fibrillation -previously he had paroxysmal atrial fibrillation which has now become persistent.  He will continue his anticoagulation.  He will need to have his warfarin dose adjusted in the next 5-7 days.  Because of his underlying complete heart block, his ventricular rate is well controlled.  He has been placed on amiodarone, with the expectation that he undergo cardioversion in the coming weeks. 4.  Aortic valve replacement -he has not had a 2D echo in 2 years.  He will need this arranged as an outpatient.   For questions or updates, please contact [facility] Please consult [email or web address] for contact info under Cardiology/STEMI.      Signed, Lewayne BuntingGregg Tajia Szeliga, MD  08/11/2017, 9:56 AM  Patient ID: Andre Malone, male   DOB: 07-23-1951, 66 y.o.   MRN: [id number]

## 2017-08-16 ENCOUNTER — Other Ambulatory Visit: Payer: Self-pay | Admitting: Nurse Practitioner

## 2017-08-16 ENCOUNTER — Telehealth: Payer: Self-pay

## 2017-08-16 ENCOUNTER — Telehealth: Payer: Self-pay | Admitting: Cardiology

## 2017-08-16 MED ORDER — LEVOTHYROXINE SODIUM 100 MCG PO TABS: 100.0000 ug | ORAL_TABLET | Freq: Every day | ORAL | 11 refills | Status: DC

## 2017-08-16 NOTE — Telephone Encounter (Signed)
Returned the call to the pharmacist at [facility]. Phone number is [phone]. The pharmacist was concerned about the increased risk for bleeding when taking amiodarone with the coumadin. Will route to pharmd for their recommendation.

## 2017-08-16 NOTE — Telephone Encounter (Signed)
Returned the call to [facility] with pharmd's information. They verbalized their understanding.

## 2017-08-16 NOTE — Telephone Encounter (Signed)
-----   Message from Bennie PieriniMary-Margaret Martin, FNP sent at 08/16/2017  8:16 AM EST ----- Regarding: RE: INR check   ----- Message ----- From: Rosalee KaufmanAlvstad, Kristin L, RPH-CPP Sent: 08/15/2017  12:35 PM To: [name], FNP Subject: INR check                                      Hi  [name] and started on amiodarone.   Can you please schedule him to get his INR checked sometime in the next 5-7 days?     Thanks,   Phillips HayKristin Alvstad PharmD

## 2017-08-16 NOTE — Telephone Encounter (Signed)
Contacted patient and appt made for tomorrow

## 2017-08-16 NOTE — Telephone Encounter (Signed)
Okay to take both medication (common combination for patient with Afib). Needs close monitoring.  Patient not followed by our coumadin clinic. Please advice patient to f/u INR ASAP.

## 2017-08-16 NOTE — Telephone Encounter (Signed)
Please call,question about the interaction between Amiodarone and Coumadin.

## 2017-08-17 ENCOUNTER — Telehealth: Payer: Self-pay | Admitting: Internal Medicine

## 2017-08-17 ENCOUNTER — Encounter: Payer: Self-pay | Admitting: Nurse Practitioner

## 2017-08-17 ENCOUNTER — Ambulatory Visit: Payer: PPO | Admitting: Nurse Practitioner

## 2017-08-17 VITALS — BP 134/77 | HR 60 | Temp 96.8°F | Ht 66.0 in | Wt 239.0 lb

## 2017-08-17 DIAGNOSIS — I480 Paroxysmal atrial fibrillation: Secondary | ICD-10-CM

## 2017-08-17 DIAGNOSIS — Z09 Encounter for follow-up examination after completed treatment for conditions other than malignant neoplasm: Secondary | ICD-10-CM

## 2017-08-17 DIAGNOSIS — Z952 Presence of prosthetic heart valve: Secondary | ICD-10-CM

## 2017-08-17 DIAGNOSIS — I5023 Acute on chronic systolic (congestive) heart failure: Secondary | ICD-10-CM | POA: Diagnosis not present

## 2017-08-17 LAB — COAGUCHEK XS/INR WAIVED
INR: 2.8 — ABNORMAL HIGH (ref 0.9–1.1)
Prothrombin Time: 33.7 s
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
## 2017-08-17 NOTE — Patient Instructions (Signed)
Electrical Cardioversion °Electrical cardioversion is the delivery of a jolt of electricity to restore a normal rhythm to the heart. A rhythm that is too fast or is not regular keeps the heart from pumping well. In this procedure, sticky patches or metal paddles are placed on the chest to deliver electricity to the heart from a device. °This procedure may be done in an emergency if: °· There is low or no blood pressure as a result of the heart rhythm. °· Normal rhythm must be restored as fast as possible to protect the brain and heart from further damage. °· It may save a life. ° °This procedure may also be done for irregular or fast heart rhythms that are not immediately life-threatening. °Tell a health care provider about: °· Any allergies you have. °· All medicines you are taking, including vitamins, herbs, eye drops, creams, and over-the-counter medicines. °· Any problems you or family members have had with anesthetic medicines. °· Any blood disorders you have. °· Any surgeries you have had. °· Any medical conditions you have. °· Whether you are pregnant or may be pregnant. °What are the risks? °Generally, this is a safe procedure. However, problems may occur, including: °· Allergic reactions to medicines. °· A blood clot that breaks free and travels to other parts of your body. °· The possible return of an abnormal heart rhythm within hours or days after the procedure. °· Your heart stopping (cardiac arrest ). This is rare. ° °What happens before the procedure? °Medicines °· Your health care provider may have you start taking: °? Blood-thinning medicines (anticoagulants) so your blood does not clot as easily. °? Medicines may be given to help stabilize your heart rate and rhythm. °· Ask your health care provider about changing or stopping your regular medicines. This is especially important if you are taking diabetes medicines or blood thinners. °General instructions °· Plan to have someone take you home from  the hospital or clinic. °· If you will be going home right after the procedure, plan to have someone with you for 24 hours. °· Follow instructions from your health care provider about eating or drinking restrictions. °What happens during the procedure? °· To lower your risk of infection: °? Your health care team will wash or sanitize their hands. °? Your skin will be washed with soap. °· An IV tube will be inserted into one of your veins. °· You will be given a medicine to help you relax (sedative). °· Sticky patches (electrodes) or metal paddles may be placed on your chest. °· An electrical shock will be delivered. °The procedure may vary among health care providers and hospitals. °What happens after the procedure? °· Your blood pressure, heart rate, breathing rate, and blood oxygen level will be monitored until the medicines you were given have worn off. °· Do not drive for 24 hours if you were given a sedative. °· Your heart rhythm will be watched to make sure it does not change. °This information is not intended to replace advice given to you by your health care provider. Make sure you discuss any questions you have with your health care provider. °Document Released: 08/11/2002 Document Revised: 04/19/2016 Document Reviewed: 02/25/2016 °Elsevier Interactive Patient Education © 2017 Elsevier Inc. ° °

## 2017-08-17 NOTE — Telephone Encounter (Signed)
Call placed to the patient. He stated that he will be having his INR checked today, results should be in Epic.   Patient was inquiring about a follow up appointment with Dr. Graciela HusbandsKlein, per his discharge instruction. Message will be sent to scheduling to have this appointment set up for him.

## 2017-08-17 NOTE — Progress Notes (Signed)
   Subjective:    Patient ID: Andre Malone, male    DOB: March 02, 1951, 66 y.o.   MRN: [id number]  HPI  Patient is here today for hospital follow up. He came in last week with c/o chest pain and sob for >2 weeks. Had some slight EKG changes and was sent to hospital. Was admitted- ruled out MI . Was diagnosed with acute on chronic CHF. Was discharged home with no medication changes. He feels much better today. No chest pain since discharge. SOB has improved. He is still in atrial fib and they are talking about cardioversion.   Review of Systems  Constitutional: Negative.   HENT: Negative.   Respiratory: Positive for shortness of breath (but is improving).   Cardiovascular: Negative.   Genitourinary: Negative.   Neurological: Negative.   Psychiatric/Behavioral: Negative.   All other systems reviewed and are negative.      Objective:   Physical Exam  Constitutional: He is oriented to person, place, and time. He appears well-developed. No distress.  Cardiovascular: Normal rate.  Irregular rhythm Clicking of artificail valve audible.  Pulmonary/Chest: Effort normal and breath sounds normal.  Neurological: He is alert and oriented to person, place, and time.  Skin: Skin is warm.  Psychiatric: He has a normal mood and affect. His behavior is normal. Judgment and thought content normal.   BP 134/77   Pulse 60   Temp (!) 96.8 F (36 C) (Oral)   Ht 5\' 6"  (1.676 m)   Wt 239 lb (108.4 kg)   SpO2 95%   BMI 38.58 kg/m   INR 2.8 today      Assessment & Plan:   1. Paroxysmal atrial fibrillation (HCC)   2. S/P AVR (aortic valve replacement)   3. Acute on chronic systolic heart failure (HCC)   4. Hospital discharge follow-up    Continue same coumdin dose- recheck in 1 month Keep follow up with cardiology To Er if develop chest pain Hospital records reviewed  [name], FNP

## 2017-08-17 NOTE — Telephone Encounter (Signed)
Patient is having SOB, that has slightly improved since going to ED over the weekend. Patient seen by PCP today. Patient does not have an appointment until 09/07/16 for f/u from hospital. Patient sounds SOB over the phone. Patient stated it is worse with activity.  Consulted DOD, Dr. Katrinka BlazingSmith, he recommends patient to come in next week for evaluation. Made patient an appointment with Jacolyn ReedyMichele Lenze NP. Informed patient to give our office a call if his SOB gets worse. Patient verbalized understanding. Will forward to Dr. Graciela HusbandsKlein so he is aware.

## 2017-08-20 ENCOUNTER — Telehealth: Payer: Self-pay | Admitting: *Deleted

## 2017-08-20 ENCOUNTER — Ambulatory Visit: Payer: PPO | Admitting: Physician Assistant

## 2017-08-20 ENCOUNTER — Encounter: Payer: Self-pay | Admitting: Physician Assistant

## 2017-08-20 VITALS — BP 112/60 | HR 60 | Ht 66.5 in | Wt 237.8 lb

## 2017-08-20 DIAGNOSIS — G8929 Other chronic pain: Secondary | ICD-10-CM | POA: Diagnosis not present

## 2017-08-20 DIAGNOSIS — I10 Essential (primary) hypertension: Secondary | ICD-10-CM | POA: Diagnosis not present

## 2017-08-20 DIAGNOSIS — M25519 Pain in unspecified shoulder: Secondary | ICD-10-CM | POA: Diagnosis not present

## 2017-08-20 DIAGNOSIS — T82110S Breakdown (mechanical) of cardiac electrode, sequela: Secondary | ICD-10-CM | POA: Diagnosis not present

## 2017-08-20 DIAGNOSIS — M25562 Pain in left knee: Secondary | ICD-10-CM | POA: Diagnosis not present

## 2017-08-20 DIAGNOSIS — I25119 Atherosclerotic heart disease of native coronary artery with unspecified angina pectoris: Secondary | ICD-10-CM | POA: Diagnosis not present

## 2017-08-20 DIAGNOSIS — N183 Chronic kidney disease, stage 3 unspecified: Secondary | ICD-10-CM

## 2017-08-20 DIAGNOSIS — I481 Persistent atrial fibrillation: Secondary | ICD-10-CM

## 2017-08-20 DIAGNOSIS — Z79891 Long term (current) use of opiate analgesic: Secondary | ICD-10-CM | POA: Diagnosis not present

## 2017-08-20 DIAGNOSIS — I5033 Acute on chronic diastolic (congestive) heart failure: Secondary | ICD-10-CM

## 2017-08-20 DIAGNOSIS — M25512 Pain in left shoulder: Secondary | ICD-10-CM | POA: Diagnosis not present

## 2017-08-20 DIAGNOSIS — M5412 Radiculopathy, cervical region: Secondary | ICD-10-CM | POA: Diagnosis not present

## 2017-08-20 DIAGNOSIS — I4819 Other persistent atrial fibrillation: Secondary | ICD-10-CM

## 2017-08-20 DIAGNOSIS — G2581 Restless legs syndrome: Secondary | ICD-10-CM | POA: Diagnosis not present

## 2017-08-20 MED ORDER — LEVOTHYROXINE SODIUM 50 MCG PO TABS: 100.0000 ug | ORAL_TABLET | Freq: Every day | ORAL | 0 refills | Status: DC

## 2017-08-20 NOTE — Patient Instructions (Addendum)
Medication Instructions:  1. INCREASE LASIX 40 MG TWICE DAILY FOR 3 DAYS THEN GO BACK TO LASIX 40 DAILY  2. INCREASE POTASSIUM TO 20 MEQ TWICE DAILY FOR 3 DAYS THEN GO BACK TO POTASSIUM 20 MEQ DAILY  Labwork: TODAY BMET, PRO BNP  Testing/Procedures: 1. Your physician has requested that you have an echocardiogram. Echocardiography is a painless test that uses sound waves to create images of your heart. It provides your doctor with information about the size and shape of your heart and how well your heart's chambers and valves are working. This procedure takes approximately one hour. There are no restrictions for this procedure.    Follow-Up: KEEP YOUR APPT WITH RENEE URSUY, PA ON 09/07/17 @ 8:45 AM   Any Other Special Instructions Will Be Listed Below (If Applicable).  If you need a refill on your cardiac medications before your next appointment, please call your pharmacy.   Low-Sodium Eating Plan Sodium, which is an element that makes up salt, helps you maintain a healthy balance of fluids in your body. Too much sodium can increase your blood pressure and cause fluid and waste to be held in your body. Your health care provider or dietitian may recommend following this plan if you have high blood pressure (hypertension), kidney disease, liver disease, or heart failure. Eating less sodium can help lower your blood pressure, reduce swelling, and protect your heart, liver, and kidneys. What are tips for following this plan? General guidelines  Most people on this plan should limit their sodium intake to 1,500-2,000 mg (milligrams) of sodium each day. Reading food labels  The Nutrition Facts label lists the amount of sodium in one serving of the food. If you eat more than one serving, you must multiply the listed amount of sodium by the number of servings.  Choose foods with less than 140 mg of sodium per serving.  Avoid foods with 300 mg of sodium or more per serving. Shopping  Look  for lower-sodium products, often labeled as "low-sodium" or "no salt added."  Always check the sodium content even if foods are labeled as "unsalted" or "no salt added".  Buy fresh foods. ? Avoid canned foods and premade or frozen meals. ? Avoid canned, cured, or processed meats  Buy breads that have less than 80 mg of sodium per slice. Cooking  Eat more home-cooked food and less restaurant, buffet, and fast food.  Avoid adding salt when cooking. Use salt-free seasonings or herbs instead of table salt or sea salt. Check with your health care provider or pharmacist before using salt substitutes.  Cook with plant-based oils, such as canola, sunflower, or olive oil. Meal planning  When eating at a restaurant, ask that your food be prepared with less salt or no salt, if possible.  Avoid foods that contain MSG (monosodium glutamate). MSG is sometimes added to [place] food, bouillon, and some canned foods. What foods are recommended? The items listed may not be a complete list. Talk with your dietitian about what dietary choices are best for you. Grains Low-sodium cereals, including oats, puffed wheat and rice, and shredded wheat. Low-sodium crackers. Unsalted rice. Unsalted pasta. Low-sodium bread. Whole-grain breads and whole-grain pasta. Vegetables Fresh or frozen vegetables. "No salt added" canned vegetables. "No salt added" tomato sauce and paste. Low-sodium or reduced-sodium tomato and vegetable juice. Fruits Fresh, frozen, or canned fruit. Fruit juice. Meats and other protein foods Fresh or frozen (no salt added) meat, poultry, seafood, and fish. Low-sodium canned tuna and salmon. Unsalted  nuts. Dried peas, beans, and lentils without added salt. Unsalted canned beans. Eggs. Unsalted nut butters. Dairy Milk. Soy milk. Cheese that is naturally low in sodium, such as ricotta cheese, fresh mozzarella, or Swiss cheese Low-sodium or reduced-sodium cheese. Cream cheese. Yogurt. Fats and  oils Unsalted butter. Unsalted margarine with no trans fat. Vegetable oils such as canola or olive oils. Seasonings and other foods Fresh and dried herbs and spices. Salt-free seasonings. Low-sodium mustard and ketchup. Sodium-free salad dressing. Sodium-free light mayonnaise. Fresh or refrigerated horseradish. Lemon juice. Vinegar. Homemade, reduced-sodium, or low-sodium soups. Unsalted popcorn and pretzels. Low-salt or salt-free chips. What foods are not recommended? The items listed may not be a complete list. Talk with your dietitian about what dietary choices are best for you. Grains Instant hot cereals. Bread stuffing, pancake, and biscuit mixes. Croutons. Seasoned rice or pasta mixes. Noodle soup cups. Boxed or frozen macaroni and cheese. Regular salted crackers. Self-rising flour. Vegetables Sauerkraut, pickled vegetables, and relishes. Olives. [place] fries. Onion rings. Regular canned vegetables (not low-sodium or reduced-sodium). Regular canned tomato sauce and paste (not low-sodium or reduced-sodium). Regular tomato and vegetable juice (not low-sodium or reduced-sodium). Frozen vegetables in sauces. Meats and other protein foods Meat or fish that is salted, canned, smoked, spiced, or pickled. Bacon, ham, sausage, hotdogs, corned beef, chipped beef, packaged lunch meats, salt pork, jerky, pickled herring, anchovies, regular canned tuna, sardines, salted nuts. Dairy Processed cheese and cheese spreads. Cheese curds. Blue cheese. Feta cheese. String cheese. Regular cottage cheese. Buttermilk. Canned milk. Fats and oils Salted butter. Regular margarine. Ghee. Bacon fat. Seasonings and other foods Onion salt, garlic salt, seasoned salt, table salt, and sea salt. Canned and packaged gravies. Worcestershire sauce. Tartar sauce. Barbecue sauce. Teriyaki sauce. Soy sauce, including reduced-sodium. Steak sauce. Fish sauce. Oyster sauce. Cocktail sauce. Horseradish that you find on the shelf.  Regular ketchup and mustard. Meat flavorings and tenderizers. Bouillon cubes. Hot sauce and Tabasco sauce. Premade or packaged marinades. Premade or packaged taco seasonings. Relishes. Regular salad dressings. Salsa. Potato and tortilla chips. Corn chips and puffs. Salted popcorn and pretzels. Canned or dried soups. Pizza. Frozen entrees and pot pies. Summary  Eating less sodium can help lower your blood pressure, reduce swelling, and protect your heart, liver, and kidneys.  Most people on this plan should limit their sodium intake to 1,500-2,000 mg (milligrams) of sodium each day.  Canned, boxed, and frozen foods are high in sodium. Restaurant foods, fast foods, and pizza are also very high in sodium. You also get sodium by adding salt to food.  Try to cook at home, eat more fresh fruits and vegetables, and eat less fast food, canned, processed, or prepared foods. This information is not intended to replace advice given to you by your health care provider. Make sure you discuss any questions you have with your health care provider. Document Released: 02/10/2002 Document Revised: 08/14/2016 Document Reviewed: 08/14/2016 Elsevier Interactive Patient Education  2017 [employer]

## 2017-08-20 NOTE — Telephone Encounter (Signed)
[facility] not [facility] 100 mcg [facility] in [facility] right now Sent in Rx for 50 mcg tablets 2 tablets qd for 1 mos.
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
## 2017-09-07 ENCOUNTER — Ambulatory Visit (HOSPITAL_COMMUNITY): Payer: Medicare HMO | Attending: Physician Assistant

## 2017-09-07 ENCOUNTER — Other Ambulatory Visit: Payer: Self-pay

## 2017-09-07 ENCOUNTER — Encounter: Payer: Medicare HMO | Admitting: Physician Assistant

## 2017-09-07 DIAGNOSIS — E119 Type 2 diabetes mellitus without complications: Secondary | ICD-10-CM | POA: Insufficient documentation

## 2017-09-07 DIAGNOSIS — I5023 Acute on chronic systolic (congestive) heart failure: Secondary | ICD-10-CM | POA: Insufficient documentation

## 2017-09-07 DIAGNOSIS — Z952 Presence of prosthetic heart valve: Secondary | ICD-10-CM | POA: Insufficient documentation

## 2017-09-07 DIAGNOSIS — I252 Old myocardial infarction: Secondary | ICD-10-CM | POA: Insufficient documentation

## 2017-09-07 DIAGNOSIS — I442 Atrioventricular block, complete: Secondary | ICD-10-CM | POA: Insufficient documentation

## 2017-09-07 DIAGNOSIS — I2510 Atherosclerotic heart disease of native coronary artery without angina pectoris: Secondary | ICD-10-CM | POA: Diagnosis not present

## 2017-09-07 DIAGNOSIS — I340 Nonrheumatic mitral (valve) insufficiency: Secondary | ICD-10-CM | POA: Diagnosis not present

## 2017-09-07 DIAGNOSIS — I110 Hypertensive heart disease with heart failure: Secondary | ICD-10-CM | POA: Insufficient documentation

## 2017-09-07 DIAGNOSIS — E785 Hyperlipidemia, unspecified: Secondary | ICD-10-CM | POA: Diagnosis not present

## 2017-09-07 MED ORDER — PERFLUTREN LIPID MICROSPHERE
1.0000 mL | INTRAVENOUS | Status: AC | PRN
  Administered 2017-09-07: 2 mL via INTRAVENOUS

## 2017-09-12 ENCOUNTER — Other Ambulatory Visit: Payer: Self-pay

## 2017-09-12 MED ORDER — LEVOTHYROXINE SODIUM 50 MCG PO TABS: 100.0000 ug | ORAL_TABLET | Freq: Every day | ORAL | 2 refills | Status: DC
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
## 2017-09-13 ENCOUNTER — Encounter: Payer: Self-pay | Admitting: Nurse Practitioner

## 2017-09-13 ENCOUNTER — Ambulatory Visit: Payer: Medicare HMO | Admitting: Nurse Practitioner

## 2017-09-13 VITALS — BP 168/72 | HR 81 | Resp 16 | Ht 66.5 in | Wt 231.8 lb

## 2017-09-13 DIAGNOSIS — T82110D Breakdown (mechanical) of cardiac electrode, subsequent encounter: Secondary | ICD-10-CM

## 2017-09-13 DIAGNOSIS — I5033 Acute on chronic diastolic (congestive) heart failure: Secondary | ICD-10-CM

## 2017-09-13 DIAGNOSIS — Z79899 Other long term (current) drug therapy: Secondary | ICD-10-CM | POA: Diagnosis not present

## 2017-09-13 DIAGNOSIS — I442 Atrioventricular block, complete: Secondary | ICD-10-CM

## 2017-09-13 DIAGNOSIS — I2510 Atherosclerotic heart disease of native coronary artery without angina pectoris: Secondary | ICD-10-CM | POA: Diagnosis not present

## 2017-09-13 DIAGNOSIS — I359 Nonrheumatic aortic valve disorder, unspecified: Secondary | ICD-10-CM

## 2017-09-13 MED ORDER — AMIODARONE HCL 200 MG PO TABS: 200.0000 mg | ORAL_TABLET | Freq: Every day | ORAL | 4 refills | Status: DC

## 2017-09-13 NOTE — Patient Instructions (Addendum)
Medication Instructions:   FOR 3 DAYS ONLY TAKE  LASIX 80 MG DAILY   THEN RESUME BACK TO LASIX 40 MG ONCE A DAY   If you need a refill on your cardiac medications before your next appointment, please call your pharmacy.  Labwork:  CBC CMET AND TSH TODAY    Testing/Procedures: NONE ORDERED  TODAY    Follow-Up: WITH DR. Ladona RidgelAYLOR IM 3 MONTHS  NEW CONSULTS  FOR  DEVICE LEAD REVISION  Any Other Special Instructions Will Be Listed Below (If Applicable).

## 2017-09-14 ENCOUNTER — Ambulatory Visit (INDEPENDENT_AMBULATORY_CARE_PROVIDER_SITE_OTHER): Payer: Medicare HMO | Admitting: Pharmacist Clinician (PhC)/ Clinical Pharmacy Specialist

## 2017-09-14 DIAGNOSIS — Z952 Presence of prosthetic heart valve: Secondary | ICD-10-CM | POA: Diagnosis not present

## 2017-09-14 DIAGNOSIS — I480 Paroxysmal atrial fibrillation: Secondary | ICD-10-CM

## 2017-09-14 LAB — COMPREHENSIVE METABOLIC PANEL
A/G RATIO: 1.3 (ref 1.2–2.2)
ALT: 16 IU/L (ref 0–44)
AST: 25 IU/L (ref 0–40)
Albumin: 4.1 g/dL (ref 3.6–4.8)
Alkaline Phosphatase: 74 IU/L (ref 39–117)
BILIRUBIN TOTAL: 0.3 mg/dL (ref 0.0–1.2)
BUN/Creatinine Ratio: 13 (ref 10–24)
BUN: 15 mg/dL (ref 8–27)
CALCIUM: 9.8 mg/dL (ref 8.6–10.2)
CHLORIDE: 101 mmol/L (ref 96–106)
CO2: 24 mmol/L (ref 20–29)
Creatinine, Ser: 1.15 mg/dL (ref 0.76–1.27)
GFR calc Af Amer: 76 mL/min/{1.73_m2} (ref >59)
GFR, EST NON AFRICAN AMERICAN: 66 mL/min/{1.73_m2} (ref >59)
GLOBULIN, TOTAL: 3.2 g/dL (ref 1.5–4.5)
Glucose: 111 mg/dL — ABNORMAL HIGH (ref 65–99)
POTASSIUM: 4.5 mmol/L (ref 3.5–5.2)
SODIUM: 139 mmol/L (ref 134–144)
Total Protein: 7.3 g/dL (ref 6.0–8.5)

## 2017-09-14 LAB — CBC
Hematocrit: 40.8 % (ref 37.5–51.0)
Hemoglobin: 13.6 g/dL (ref 13.0–17.7)
MCH: 29.2 pg (ref 26.6–33.0)
MCHC: 33.3 g/dL (ref 31.5–35.7)
MCV: 88 fL (ref 79–97)
PLATELETS: 209 10*3/uL (ref 150–379)
RBC: 4.65 x10E6/uL (ref 4.14–5.80)
RDW: 16.5 % — AB (ref 12.3–15.4)
WBC: 9.7 10*3/uL (ref 3.4–10.8)

## 2017-09-14 LAB — COAGUCHEK XS/INR WAIVED
INR: 3.3 — AB (ref 0.9–1.1)
PROTHROMBIN TIME: 39.8 s

## 2017-09-14 LAB — TSH: TSH: 6.15 u[IU]/mL — ABNORMAL HIGH (ref 0.450–4.500)

## 2017-09-14 NOTE — Patient Instructions (Signed)
Description   Take 1/2 tablet today then tomorrow resume 1 tablet a day.  INR today is 3.3  (Goal 2.5-3.0)  A little thin today

## 2017-09-19 ENCOUNTER — Other Ambulatory Visit: Payer: Self-pay | Admitting: *Deleted

## 2017-09-19 MED ORDER — POTASSIUM CHLORIDE ER 20 MEQ PO TBCR: 20.0000 meq | EXTENDED_RELEASE_TABLET | Freq: Every day | ORAL | 2 refills | Status: DC

## 2017-09-20 ENCOUNTER — Telehealth: Payer: Self-pay | Admitting: *Deleted

## 2017-09-20 NOTE — Telephone Encounter (Signed)
Patient would like a call back about his amiodarone. He wants to know the dose and how much to take. When looking at his last OV it looks to be decrease from 200mg  twice daily to 200mg  once daily but he says he was taking 100mg  twice daily and never was on 200mg  twice daily.  Please advise.

## 2017-09-20 NOTE — Telephone Encounter (Signed)
Returned call to Pt.  Per review of last note by Amber, Pt should be taking amiodarone 200 mg one tablet daily.  Notified Pt.  Pt indicates understanding.  States he would like to follow up with Dr. Ladona Ridgelaylor prior to the April appointment scheduled.  Notified Pt I would give this to scheduling to see if we could get him in sooner.

## 2017-09-24 ENCOUNTER — Ambulatory Visit (INDEPENDENT_AMBULATORY_CARE_PROVIDER_SITE_OTHER): Payer: Medicare HMO

## 2017-09-24 ENCOUNTER — Encounter: Payer: Self-pay | Admitting: *Deleted

## 2017-09-24 ENCOUNTER — Ambulatory Visit (INDEPENDENT_AMBULATORY_CARE_PROVIDER_SITE_OTHER): Payer: Medicare HMO | Admitting: Nurse Practitioner

## 2017-09-24 VITALS — BP 146/71 | HR 68 | Temp 97.0°F | Ht 66.0 in | Wt 232.0 lb

## 2017-09-24 DIAGNOSIS — M25511 Pain in right shoulder: Secondary | ICD-10-CM

## 2017-09-24 DIAGNOSIS — S65514A Laceration of blood vessel of right ring finger, initial encounter: Secondary | ICD-10-CM | POA: Diagnosis not present

## 2017-09-24 DIAGNOSIS — S40011A Contusion of right shoulder, initial encounter: Secondary | ICD-10-CM

## 2017-09-24 DIAGNOSIS — Z23 Encounter for immunization: Secondary | ICD-10-CM

## 2017-09-24 DIAGNOSIS — R509 Fever, unspecified: Secondary | ICD-10-CM

## 2017-09-24 DIAGNOSIS — M19011 Primary osteoarthritis, right shoulder: Secondary | ICD-10-CM | POA: Diagnosis not present

## 2017-09-24 NOTE — Progress Notes (Signed)
   Subjective:    Patient ID: Andre Malone, male    DOB: Nov 22, 1950, 67 y.o.   MRN: [id number]  HPI Patient comes in saying he cut some finger son right hand in a milk shake maker at home. He also had a tree branch fall on hit his right shoulder yesterday also and has some bruising but is able  To fully move everything.he also had a fever of 102 this afternoon.  Review of Systems  Constitutional: Positive for chills and fever.  HENT: Negative.   Respiratory: Negative.   Cardiovascular: Negative.   Musculoskeletal: Positive for arthralgias (right shoulder).  Neurological: Negative.   Psychiatric/Behavioral: Negative.        Objective:   Physical Exam  Constitutional: He is oriented to person, place, and time. He appears well-developed and well-nourished. No distress.  HENT:  Right Ear: Hearing, tympanic membrane, external ear and ear canal normal.  Left Ear: Hearing, tympanic membrane, external ear and ear canal normal.  Nose: Mucosal edema and rhinorrhea present. Right sinus exhibits no maxillary sinus tenderness and no frontal sinus tenderness. Left sinus exhibits no maxillary sinus tenderness and no frontal sinus tenderness.  Mouth/Throat: Uvula is midline, oropharynx is clear and moist and mucous membranes are normal.  Cardiovascular: Normal rate.  Clicking of heart valve audible  Pulmonary/Chest: Effort normal and breath sounds normal. No respiratory distress. He has no wheezes. He has no rales.  Abdominal: Soft. Bowel sounds are normal.  Musculoskeletal:  FROM of right shoulder with pain on ext and abduction and internal rotation.  Neurological: He is alert and oriented to person, place, and time.  Skin: Skin is warm.  Small superficial cut to right ring finger- no erythema or drainage  Psychiatric: He has a normal mood and affect. His behavior is normal. Judgment and thought content normal.    Right shoulder xray normal-Preliminary reading by Paulene FloorMary Koleen Celia, FNP   [facility]   BP (!) 146/71   Pulse 68   Temp (!) 97 F (36.1 C) (Oral)   Ht 5\' 6"  (1.676 m)   Wt 232 lb (105.2 kg)   BMI 37.45 kg/m      Assessment & Plan:   1. Acute pain of right shoulder   2. Contusion of multiple sites of right shoulder, initial encounter   3. Laceration of blood vessel of right ring finger, initial encounter   4. Fever, unspecified fever cause    Watch fever- probably viral- motrin of tylenol as needed Ice to right shoulder watch for infection of right ring finger RTO if anything changes  [name], FNP

## 2017-09-24 NOTE — Addendum Note (Signed)
Addended by: Almeta MonasSTONE, JANIE M on: 09/24/2017 05:31 PM   Modules accepted: Orders

## 2017-09-24 NOTE — Patient Instructions (Signed)
Fever, Adult A fever is an increase in the body's temperature. It is often defined as a temperature of 100 F (38C) or higher. Short mild or moderate fevers often have no long-term effects. They also often do not need treatment. Moderate or high fevers may make you feel uncomfortable. Sometimes, they can also be a sign of a serious illness or disease. The sweating that may happen with repeated fevers or fevers that last a while may also cause you to not have enough fluid in your body (dehydration). You can take your temperature with a thermometer to see if you have a fever. A measured temperature can change with:  Age.  Time of day.  Where the thermometer is placed: ? Mouth (oral). ? Rectum (rectal). ? Ear (tympanic). ? Underarm (axillary). ? Forehead (temporal).  Follow these instructions at home: Pay attention to any changes in your symptoms. Take these actions to help with your condition:  Take over-the-counter and prescription medicines only as told by your doctor. Follow the dosing instructions carefully.  If you were prescribed an antibiotic medicine, take it as told by your doctor. Do not stop taking the antibiotic even if you start to feel better.  Rest as needed.  Drink enough fluid to keep your pee (urine) clear or pale yellow.  Sponge yourself or bathe with room-temperature water as needed. This helps to lower your body temperature . Do not use ice water.  Do not wear too many blankets or heavy clothes.  Contact a doctor if:  You throw up (vomit).  You cannot eat or drink without throwing up.  You have watery poop (diarrhea).  It hurts when you pee.  Your symptoms do not get better with treatment.  You have new symptoms.  You feel very weak. Get help right away if:  You are short of breath or have trouble breathing.  You are dizzy or you pass out (faint).  You feel confused.  You have signs of not having enough fluid in your body, such as: ? A dry  mouth. ? Peeing less. ? Looking pale.  You have very bad pain in your belly (abdomen).  You keep throwing up or having water poop.  You have a skin rash.  Your symptoms suddenly get worse. This information is not intended to replace advice given to you by your health care provider. Make sure you discuss any questions you have with your health care provider. Document Released: 05/30/2008 Document Revised: 01/27/2016 Document Reviewed: 10/15/2014 Elsevier Interactive Patient Education  2018 [employer]  

## 2017-09-26 LAB — CUP PACEART INCLINIC DEVICE CHECK
Implantable Lead Implant Date: 20100913
Implantable Lead Location: 753860
Implantable Pulse Generator Implant Date: 20150114
MDC IDC LEAD IMPLANT DT: 20150114
MDC IDC LEAD LOCATION: 753859
MDC IDC SESS DTM: 20190123084821
Pulse Gen Serial Number: 7586540

## 2017-10-03 ENCOUNTER — Ambulatory Visit (INDEPENDENT_AMBULATORY_CARE_PROVIDER_SITE_OTHER): Payer: Medicare HMO | Admitting: Family Medicine

## 2017-10-03 ENCOUNTER — Encounter: Payer: Self-pay | Admitting: Family Medicine

## 2017-10-03 ENCOUNTER — Ambulatory Visit (INDEPENDENT_AMBULATORY_CARE_PROVIDER_SITE_OTHER): Payer: Medicare HMO

## 2017-10-03 ENCOUNTER — Telehealth: Payer: Self-pay

## 2017-10-03 VITALS — BP 136/71 | HR 59 | Temp 96.7°F

## 2017-10-03 DIAGNOSIS — R51 Headache: Secondary | ICD-10-CM | POA: Diagnosis not present

## 2017-10-03 DIAGNOSIS — M79602 Pain in left arm: Secondary | ICD-10-CM

## 2017-10-03 DIAGNOSIS — M25512 Pain in left shoulder: Secondary | ICD-10-CM | POA: Diagnosis not present

## 2017-10-03 DIAGNOSIS — S59912A Unspecified injury of left forearm, initial encounter: Secondary | ICD-10-CM | POA: Diagnosis not present

## 2017-10-03 DIAGNOSIS — S4992XA Unspecified injury of left shoulder and upper arm, initial encounter: Secondary | ICD-10-CM | POA: Diagnosis not present

## 2017-10-03 DIAGNOSIS — M79632 Pain in left forearm: Secondary | ICD-10-CM | POA: Diagnosis not present

## 2017-10-03 DIAGNOSIS — R519 Headache, unspecified: Secondary | ICD-10-CM

## 2017-10-03 MED ORDER — BACLOFEN 10 MG PO TABS: 10.0000 mg | ORAL_TABLET | Freq: Three times a day (TID) | ORAL | 2 refills | Status: DC

## 2017-10-03 NOTE — Telephone Encounter (Signed)
Patient said you were suppose to call something into [facility] for him?

## 2017-10-03 NOTE — Progress Notes (Signed)
BP 136/71   Pulse (!) 59   Temp (!) 96.7 F (35.9 C) (Oral)    Subjective:    Patient ID: Andre Malone, male    DOB: 1951-03-18, 67 y.o.   MRN: [id number]  HPI: Andre Malone is a 67 y.o. male presenting on 10/03/2017 for Fall (fell a few nights ago while sleepwalking, has pain in his left arm, knots on left side of head)   HPI Headaches and sore left arm Patient is coming in with complaints of headache on the left side of his head from where he fell and hit that side of his head.  He is also been complaining of pain going down through his left shoulder and neck down into his left arm and has bruising on his left forearm where he fell.  He says that 4 nights ago he was "sleepwalking".  He says that he remembers falling back and sticking his arms out to catch himself and he landed on that left side of his body and hit his head on the ground.  He says that he has been trying to manage it with Tylenol and his pain medications that he has but says that the pain and soreness and headaches are just not improving.  He denies any loss of range of motion or weakness but he does have pain all the way through his forearm with rotation and grip strength and movement with his elbow and pain in his shoulder movement as well and some pain in his left side of his neck with movement as well his pain going up over his left ear and that side of his head on the left parietal occipital region.  He also has some bruising on the side of his head with a small hematoma as well.  Relevant past medical, surgical, family and social history reviewed and updated as indicated. Interim medical history since our last visit reviewed. Allergies and medications reviewed and updated.  Review of Systems  Constitutional: Negative for chills and fever.  Eyes: Negative for photophobia and visual disturbance.  Respiratory: Negative for shortness of breath and wheezing.   Cardiovascular: Negative for chest pain and leg  swelling.  Musculoskeletal: Positive for arthralgias and myalgias. Negative for back pain and gait problem.  Skin: Positive for color change. Negative for rash.  Neurological: Positive for headaches. Negative for dizziness, speech difficulty, weakness, light-headedness and numbness.  All other systems reviewed and are negative.   Per HPI unless specifically indicated above   Allergies as of 10/03/2017      Reactions   Apresoline [hydralazine] Nausea And Vomiting   Ace Inhibitors Other (See Comments)   cough   Benadryl [diphenhydramine Hcl] Other (See Comments)   Makes restless legs worse   Diphenhydramine Hcl Other (See Comments)   Makes restless legs worse   Imdur [isosorbide Dinitrate] Other (See Comments)   headache   Metoprolol Other (See Comments)   Kidney failure   Nsaids Other (See Comments)   REACTION: Currently taking Coumadin   Valsartan Other (See Comments)   REACTION:shuts down Kidney      Medication List        Accurate as of 10/03/17 12:00 PM. Always use your most recent med list.          acetaminophen 325 MG tablet Commonly known as:  TYLENOL Take 2 tablets (650 mg total) by mouth every 4 (four) hours as needed for headache or mild pain.   amiodarone 200 MG tablet Commonly known  as:  PACERONE Take 1 tablet (200 mg total) by mouth daily.   aspirin 81 MG EC tablet Take 1 tablet (81 mg total) by mouth daily.   furosemide 40 MG tablet Commonly known as:  LASIX Take 1 tablet (40 mg total) by mouth daily.   insulin glargine 100 UNIT/ML injection Commonly known as:  LANTUS Inject 0.4 mLs (40 Units total) into the skin daily after supper.   levothyroxine 100 MCG tablet Commonly known as:  SYNTHROID Take 1 tablet (100 mcg total) by mouth daily.   levothyroxine 50 MCG tablet Commonly known as:  SYNTHROID, LEVOTHROID Take 2 tablets (100 mcg total) by mouth daily.   LYRICA 75 MG capsule Generic drug:  pregabalin Take 75 mg by mouth daily as needed  (burning sensation in feet).   metFORMIN 1000 MG tablet Commonly known as:  GLUCOPHAGE TAKE 1 TABLET (1,000 MG TOTAL) BY MOUTH 2 (TWO) TIMES DAILY WITH A MEAL.   multivitamin with minerals Tabs tablet Take 1 tablet by mouth daily.   oxyCODONE 15 MG immediate release tablet Commonly known as:  ROXICODONE Take 15 mg by mouth every 6 (six) hours as needed for pain.   Potassium Chloride ER 20 MEQ Tbcr Take 20 mEq by mouth daily.   pramipexole 1.5 MG tablet Commonly known as:  MIRAPEX Take 1 tablet (1.5 mg total) by mouth at bedtime.   pravastatin 40 MG tablet Commonly known as:  PRAVACHOL TAKE 1 TABLET (40 MG TOTAL) BY MOUTH EVERY EVENING. FOR CHOLESTEROL   sertraline 100 MG tablet Commonly known as:  ZOLOFT Take 1 tablet (100 mg total) by mouth daily.   tamsulosin 0.4 MG Caps capsule Commonly known as:  FLOMAX Take 1 capsule (0.4 mg total) by mouth daily as needed. Reported on 01/05/2016   traMADol 50 MG tablet Commonly known as:  ULTRAM Take 50 mg by mouth as needed for moderate pain.   traZODone 100 MG tablet Commonly known as:  DESYREL TAKE ONE TABLET BY MOUTH AT BEDTIME   warfarin 5 MG tablet Commonly known as:  COUMADIN Take as directed by the anticoagulation clinic. If you are unsure how to take this medication, talk to your nurse or doctor. Original instructions:  Take 1/2 tablet on Monday and Thursday and then take 1 tablet all there other days          Objective:    BP 136/71   Pulse (!) 59   Temp (!) 96.7 F (35.9 C) (Oral)   Wt Readings from Last 3 Encounters:  09/24/17 232 lb (105.2 kg)  09/13/17 231 lb 12.8 oz (105.1 kg)  08/20/17 237 lb 12.8 oz (107.9 kg)    Physical Exam  Constitutional: He is oriented to person, place, and time. He appears well-developed and well-nourished. No distress.  Eyes: Conjunctivae are normal. No scleral icterus.  Cardiovascular: Normal rate, regular rhythm, normal heart sounds and intact distal pulses.  No murmur  heard. Pulmonary/Chest: Effort normal and breath sounds normal. No respiratory distress. He has no wheezes. He has no rales.  Musculoskeletal: Normal range of motion. He exhibits tenderness (Bruising over left posterior forearm and small hematoma and bruising over the left side of scalp with soreness in all of the muscle groups extending from his neck down to his forearm). He exhibits no edema.  No numbness or weakness noted on exam, patient has limited abduction past 90 degrees on his shoulder but he says it always been like that.  Neurological: He is alert and oriented to  person, place, and time. Coordination normal.  Skin: Skin is warm and dry. No rash noted. He is not diaphoretic.  Psychiatric: He has a normal mood and affect. His behavior is normal.  Nursing note and vitals reviewed.       Assessment & Plan:   Problem List Items Addressed This Visit    None    Visit Diagnoses    Left arm pain    -  Primary   Likely muscular in bruising and soft tissue in origin, no weakness or numbness noted but just pain with range of motion of the forearm and shoulder.   Relevant Orders   DG Forearm Left (Completed)   DG Shoulder Left (Completed)   Acute nonintractable headache, unspecified headache type           Follow up plan: Return if symptoms worsen or fail to improve.  Counseling provided for all of the vaccine components Orders Placed This Encounter  Procedures  . DG Forearm Left  . DG Shoulder Left    Arville Care, MD [facility] 10/03/2017, 12:00 PM

## 2017-10-09 ENCOUNTER — Ambulatory Visit: Payer: Medicare HMO | Admitting: Internal Medicine

## 2017-10-15 ENCOUNTER — Ambulatory Visit (INDEPENDENT_AMBULATORY_CARE_PROVIDER_SITE_OTHER): Payer: Medicare HMO | Admitting: *Deleted

## 2017-10-15 DIAGNOSIS — I442 Atrioventricular block, complete: Secondary | ICD-10-CM

## 2017-10-16 NOTE — Progress Notes (Signed)
Remote pacemaker transmission.   

## 2017-10-17 ENCOUNTER — Encounter: Payer: Self-pay | Admitting: Cardiology

## 2017-10-17 DIAGNOSIS — M25512 Pain in left shoulder: Secondary | ICD-10-CM | POA: Diagnosis not present

## 2017-10-17 DIAGNOSIS — M5412 Radiculopathy, cervical region: Secondary | ICD-10-CM | POA: Diagnosis not present

## 2017-10-17 DIAGNOSIS — G2581 Restless legs syndrome: Secondary | ICD-10-CM | POA: Diagnosis not present

## 2017-10-17 DIAGNOSIS — E0842 Diabetes mellitus due to underlying condition with diabetic polyneuropathy: Secondary | ICD-10-CM | POA: Diagnosis not present

## 2017-10-23 LAB — CUP PACEART REMOTE DEVICE CHECK
Battery Remaining Percentage: 95.5 %
Battery Voltage: 2.98 V
Brady Statistic AP VP Percent: 80 %
Brady Statistic AS VS Percent: 1 %
Brady Statistic RA Percent Paced: 82 %
Brady Statistic RV Percent Paced: 99 %
Date Time Interrogation Session: 20190211070013
Implantable Lead Implant Date: 20100913
Implantable Lead Implant Date: 20150114
Implantable Lead Location: 753859
Implantable Pulse Generator Implant Date: 20150114
Lead Channel Impedance Value: 290 Ohm
Lead Channel Impedance Value: 580 Ohm
Lead Channel Pacing Threshold Amplitude: 1 V
Lead Channel Pacing Threshold Pulse Width: 0.5 ms
Lead Channel Pacing Threshold Pulse Width: 0.5 ms
Lead Channel Sensing Intrinsic Amplitude: 2.1 mV
Lead Channel Setting Sensing Sensitivity: 4 mV
MDC IDC LEAD LOCATION: 753860
MDC IDC MSMT BATTERY REMAINING LONGEVITY: 94 mo
MDC IDC MSMT LEADCHNL RV PACING THRESHOLD AMPLITUDE: 1 V
MDC IDC MSMT LEADCHNL RV SENSING INTR AMPL: 8.5 mV
MDC IDC PG SERIAL: 7586540
MDC IDC SET LEADCHNL RA PACING AMPLITUDE: 2 V
MDC IDC SET LEADCHNL RV PACING AMPLITUDE: 2.5 V
MDC IDC SET LEADCHNL RV PACING PULSEWIDTH: 0.5 ms
MDC IDC STAT BRADY AP VS PERCENT: 1 %
MDC IDC STAT BRADY AS VP PERCENT: 19 %
Pulse Gen Model: 2240

## 2017-10-25 ENCOUNTER — Emergency Department (HOSPITAL_COMMUNITY)
Admission: EM | Admit: 2017-10-25 | Discharge: 2017-10-25 | Disposition: A | Discharge status: Home / Self Care | Payer: Medicare HMO | Attending: Emergency Medicine | Admitting: Emergency Medicine

## 2017-10-25 ENCOUNTER — Emergency Department (HOSPITAL_COMMUNITY): Payer: Medicare HMO

## 2017-10-25 DIAGNOSIS — Z950 Presence of cardiac pacemaker: Secondary | ICD-10-CM | POA: Insufficient documentation

## 2017-10-25 DIAGNOSIS — Y939 Activity, unspecified: Secondary | ICD-10-CM | POA: Diagnosis not present

## 2017-10-25 DIAGNOSIS — I130 Hypertensive heart and chronic kidney disease with heart failure and stage 1 through stage 4 chronic kidney disease, or unspecified chronic kidney disease: Secondary | ICD-10-CM | POA: Diagnosis not present

## 2017-10-25 DIAGNOSIS — Z7901 Long term (current) use of anticoagulants: Secondary | ICD-10-CM | POA: Insufficient documentation

## 2017-10-25 DIAGNOSIS — Y92010 Kitchen of single-family (private) house as the place of occurrence of the external cause: Secondary | ICD-10-CM | POA: Diagnosis not present

## 2017-10-25 DIAGNOSIS — N189 Chronic kidney disease, unspecified: Secondary | ICD-10-CM | POA: Insufficient documentation

## 2017-10-25 DIAGNOSIS — E039 Hypothyroidism, unspecified: Secondary | ICD-10-CM | POA: Diagnosis not present

## 2017-10-25 DIAGNOSIS — S0990XA Unspecified injury of head, initial encounter: Secondary | ICD-10-CM

## 2017-10-25 DIAGNOSIS — I5022 Chronic systolic (congestive) heart failure: Secondary | ICD-10-CM | POA: Diagnosis not present

## 2017-10-25 DIAGNOSIS — W01198A Fall on same level from slipping, tripping and stumbling with subsequent striking against other object, initial encounter: Secondary | ICD-10-CM | POA: Insufficient documentation

## 2017-10-25 DIAGNOSIS — Z7982 Long term (current) use of aspirin: Secondary | ICD-10-CM | POA: Diagnosis not present

## 2017-10-25 DIAGNOSIS — S098XXA Other specified injuries of head, initial encounter: Secondary | ICD-10-CM | POA: Diagnosis not present

## 2017-10-25 DIAGNOSIS — Y999 Unspecified external cause status: Secondary | ICD-10-CM | POA: Insufficient documentation

## 2017-10-25 DIAGNOSIS — S0003XA Contusion of scalp, initial encounter: Secondary | ICD-10-CM | POA: Diagnosis not present

## 2017-10-25 DIAGNOSIS — Z794 Long term (current) use of insulin: Secondary | ICD-10-CM | POA: Insufficient documentation

## 2017-10-25 DIAGNOSIS — W19XXXA Unspecified fall, initial encounter: Secondary | ICD-10-CM

## 2017-10-25 DIAGNOSIS — G4489 Other headache syndrome: Secondary | ICD-10-CM | POA: Diagnosis not present

## 2017-10-25 DIAGNOSIS — E1122 Type 2 diabetes mellitus with diabetic chronic kidney disease: Secondary | ICD-10-CM | POA: Diagnosis not present

## 2017-10-25 DIAGNOSIS — S0083XA Contusion of other part of head, initial encounter: Secondary | ICD-10-CM

## 2017-10-25 LAB — BASIC METABOLIC PANEL
ANION GAP: 12 (ref 5–15)
BUN: 20 mg/dL (ref 6–20)
CO2: 22 mmol/L (ref 22–32)
Calcium: 8.8 mg/dL — ABNORMAL LOW (ref 8.9–10.3)
Chloride: 105 mmol/L (ref 101–111)
Creatinine, Ser: 1.17 mg/dL (ref 0.61–1.24)
GFR calc Af Amer: >60 mL/min (ref >60)
Glucose, Bld: 132 mg/dL — ABNORMAL HIGH (ref 65–99)
POTASSIUM: 3.6 mmol/L (ref 3.5–5.1)
SODIUM: 139 mmol/L (ref 135–145)

## 2017-10-25 LAB — CBC WITH DIFFERENTIAL/PLATELET
BASOS ABS: 0 10*3/uL (ref 0.0–0.1)
Basophils Relative: 1 %
Eosinophils Absolute: 0.4 10*3/uL (ref 0.0–0.7)
Eosinophils Relative: 5 %
HEMATOCRIT: 36 % — AB (ref 39.0–52.0)
HEMOGLOBIN: 11.6 g/dL — AB (ref 13.0–17.0)
LYMPHS PCT: 16 %
Lymphs Abs: 1.2 10*3/uL (ref 0.7–4.0)
MCH: 29 pg (ref 26.0–34.0)
MCHC: 32.2 g/dL (ref 30.0–36.0)
MCV: 90 fL (ref 78.0–100.0)
Monocytes Absolute: 0.8 10*3/uL (ref 0.1–1.0)
Monocytes Relative: 11 %
NEUTROS ABS: 5.1 10*3/uL (ref 1.7–7.7)
NEUTROS PCT: 67 %
PLATELETS: 164 10*3/uL (ref 150–400)
RBC: 4 MIL/uL — AB (ref 4.22–5.81)
RDW: 16.8 % — ABNORMAL HIGH (ref 11.5–15.5)
WBC: 7.5 10*3/uL (ref 4.0–10.5)

## 2017-10-25 LAB — PROTIME-INR
INR: 3.57
Prothrombin Time: 35.4 seconds — ABNORMAL HIGH (ref 11.4–15.2)

## 2017-10-25 LAB — CBG MONITORING, ED: GLUCOSE-CAPILLARY: 117 mg/dL — AB (ref 65–99)

## 2017-10-25 MED ORDER — FENTANYL CITRATE (PF) 100 MCG/2ML IJ SOLN
50.0000 ug | Freq: Once | INTRAMUSCULAR | Status: AC
  Administered 2017-10-25: 50 ug via INTRAVENOUS
  Filled 2017-10-25: qty 2

## 2017-10-25 NOTE — ED Triage Notes (Signed)
Pt to ED for fall where pt tripped and hit head on granite countertop. Pt on Warfarin. Hematoma and lac to left forehead. NAD. Pt A/O x4.

## 2017-10-25 NOTE — ED Notes (Signed)
Patient transported to CT 

## 2017-10-25 NOTE — Discharge Instructions (Addendum)
Take half of your normal dose today of your Coumadin to allow it to go back to normal level.  You Should get it checked early next week.
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
## 2017-10-25 NOTE — ED Notes (Signed)
Pt ambulated steady gait in hall without assistance

## 2017-10-26 ENCOUNTER — Encounter: Payer: Self-pay | Admitting: Pharmacist Clinician (PhC)/ Clinical Pharmacy Specialist

## 2017-11-05 ENCOUNTER — Other Ambulatory Visit: Payer: Self-pay | Admitting: *Deleted

## 2017-11-05 ENCOUNTER — Ambulatory Visit (INDEPENDENT_AMBULATORY_CARE_PROVIDER_SITE_OTHER): Payer: Medicare HMO | Admitting: Nurse Practitioner

## 2017-11-05 ENCOUNTER — Encounter: Payer: Self-pay | Admitting: Nurse Practitioner

## 2017-11-05 DIAGNOSIS — Z952 Presence of prosthetic heart valve: Secondary | ICD-10-CM

## 2017-11-05 DIAGNOSIS — I480 Paroxysmal atrial fibrillation: Secondary | ICD-10-CM

## 2017-11-05 LAB — COAGUCHEK XS/INR WAIVED
INR: 4.3 — AB (ref 0.9–1.1)
PROTHROMBIN TIME: 52.1 s

## 2017-11-05 NOTE — Addendum Note (Signed)
Addended by: Margorie JohnJOHNSON, Majour Frei M on: 11/05/2017 03:35 PM   Modules accepted: Orders

## 2017-11-05 NOTE — Progress Notes (Signed)
Subjective:   Patient in today for INR only   Indication: atrial fibrillation and coronary artery disease Bleeding signs/symptoms: None Thromboembolic signs/symptoms: None  Missed Coumadin doses: None Medication changes: no Dietary changes: no Bacterial/viral infection: no Other concerns: no  The following portions of the patient's history were reviewed and updated as appropriate: allergies, current medications, past family history, past medical history, past social history, past surgical history and problem list.  Review of Systems Pertinent items noted in HPI and remainder of comprehensive ROS otherwise negative.    * patient fell 2  Weeks ago, he tripped over some tools in floor and hit his head left shoulder and right hip when he fell. He went to ER and nothing was broken. He does have some contusions on face left shoulder an driht hip.  Objective:    INR Today: 4.3 Current dose: coumadin 5mg  daily except 2.5 mg on Monday and thursday    Assessment:    Supratherapeutic INR for goal of 2.5-3.0   Plan:    1. New dose: coumadin 5mg  daily except 2.5 mg M,W and F   2. Next INR: 2 weeks    [name], FNP

## 2017-11-12 ENCOUNTER — Other Ambulatory Visit: Payer: Self-pay | Admitting: Cardiology

## 2017-11-12 NOTE — Telephone Encounter (Signed)
REFILL 

## 2017-11-13 ENCOUNTER — Telehealth: Payer: Self-pay

## 2017-11-13 NOTE — Telephone Encounter (Signed)
Spoke with patient regarding his canceled appt to see Dr. Ladona Ridgelaylor for lead revision. Patient declined Monday the 18th appt as he has labs scheduled for that day. He confirmed availability for 11/28/16 at 3:30pm

## 2017-11-15 DIAGNOSIS — Z6841 Body Mass Index (BMI) 40.0 and over, adult: Secondary | ICD-10-CM | POA: Diagnosis not present

## 2017-11-15 DIAGNOSIS — R69 Illness, unspecified: Secondary | ICD-10-CM | POA: Diagnosis not present

## 2017-11-15 DIAGNOSIS — E785 Hyperlipidemia, unspecified: Secondary | ICD-10-CM | POA: Diagnosis not present

## 2017-11-15 DIAGNOSIS — G2581 Restless legs syndrome: Secondary | ICD-10-CM | POA: Diagnosis not present

## 2017-11-15 DIAGNOSIS — I4891 Unspecified atrial fibrillation: Secondary | ICD-10-CM | POA: Diagnosis not present

## 2017-11-15 DIAGNOSIS — E039 Hypothyroidism, unspecified: Secondary | ICD-10-CM | POA: Diagnosis not present

## 2017-11-15 DIAGNOSIS — Z794 Long term (current) use of insulin: Secondary | ICD-10-CM | POA: Diagnosis not present

## 2017-11-15 DIAGNOSIS — E1142 Type 2 diabetes mellitus with diabetic polyneuropathy: Secondary | ICD-10-CM | POA: Diagnosis not present

## 2017-11-20 ENCOUNTER — Ambulatory Visit (INDEPENDENT_AMBULATORY_CARE_PROVIDER_SITE_OTHER): Payer: Medicare HMO | Admitting: Nurse Practitioner

## 2017-11-20 ENCOUNTER — Encounter: Payer: Self-pay | Admitting: Nurse Practitioner

## 2017-11-20 DIAGNOSIS — I480 Paroxysmal atrial fibrillation: Secondary | ICD-10-CM

## 2017-11-20 DIAGNOSIS — Z952 Presence of prosthetic heart valve: Secondary | ICD-10-CM

## 2017-11-20 NOTE — Progress Notes (Signed)
Subjective:   Patient in for INR only   Indication: coronary artery disease and aortic valve replacement Bleeding signs/symptoms: None Thromboembolic signs/symptoms: None  Missed Coumadin doses: None Medication changes: no Dietary changes: no Bacterial/viral infection: no Other concerns: no  The following portions of the patient's history were reviewed and updated as appropriate: allergies, current medications, past family history, past medical history, past social history, past surgical history and problem list.  Review of Systems Pertinent items are noted in HPI.   Objective:    INR Today: 4.4 Current dose: 5mg  daily except 2.5 mg mon, wed and thurs    Assessment:    Supratherapeutic INR for goal of 2.5-3.0   Plan:    1. New dose: hold today then 5mg  daily except 2.5 mg on mon, tues , wed and thurs   2. Next INR: 2 weeks    Andre Daphine DeutscherMartin, FNP

## 2017-11-21 LAB — COAGUCHEK XS/INR WAIVED
INR: 4.4 — ABNORMAL HIGH (ref 0.9–1.1)
PROTHROMBIN TIME: 53.1 s

## 2017-11-23 ENCOUNTER — Telehealth: Payer: Self-pay | Admitting: Nurse Practitioner

## 2017-11-23 MED ORDER — CLOTRIMAZOLE 1 % EX CREA: 1.0000 "application " | TOPICAL_CREAM | Freq: Two times a day (BID) | CUTANEOUS | 0 refills | Status: DC

## 2017-11-23 NOTE — Telephone Encounter (Signed)
Cream sent to pharmacy per patients request

## 2017-11-28 ENCOUNTER — Encounter: Payer: Medicare HMO | Admitting: Internal Medicine

## 2017-11-28 DIAGNOSIS — R0989 Other specified symptoms and signs involving the circulatory and respiratory systems: Secondary | ICD-10-CM

## 2017-11-29 ENCOUNTER — Encounter: Payer: Self-pay | Admitting: Internal Medicine

## 2017-12-04 ENCOUNTER — Ambulatory Visit (INDEPENDENT_AMBULATORY_CARE_PROVIDER_SITE_OTHER): Payer: Medicare HMO | Admitting: Nurse Practitioner

## 2017-12-04 ENCOUNTER — Ambulatory Visit: Payer: Medicare HMO | Admitting: Internal Medicine

## 2017-12-04 ENCOUNTER — Encounter: Payer: Self-pay | Admitting: Nurse Practitioner

## 2017-12-04 VITALS — BP 143/72 | HR 60 | Temp 97.9°F | Ht 66.0 in | Wt 238.0 lb

## 2017-12-04 DIAGNOSIS — I480 Paroxysmal atrial fibrillation: Secondary | ICD-10-CM

## 2017-12-04 DIAGNOSIS — Z952 Presence of prosthetic heart valve: Secondary | ICD-10-CM

## 2017-12-04 LAB — COAGUCHEK XS/INR WAIVED
INR: 4 — AB (ref 0.9–1.1)
PROTHROMBIN TIME: 48.4 s

## 2017-12-04 NOTE — Progress Notes (Signed)
Subjective:   Patient here today for INR only   Indication: atrial fibrillation Bleeding signs/symptoms: None Thromboembolic signs/symptoms: None  Missed Coumadin doses: None Medication changes: no Dietary changes: no Bacterial/viral infection: no Other concerns: no  The following portions of the patient's history were reviewed and updated as appropriate: allergies, current medications, past family history, past medical history, past social history, past surgical history and problem list.  Review of Systems Pertinent items noted in HPI and remainder of comprehensive ROS otherwise negative.   Objective:    INR Today: 4.0 Current dose: coumadin 2.5mg  daily except 5mg  fri, sat andsun    Assessment:    Supratherapeutic INR for goal of 2.5-3.0   Plan:    1. New dose: coumadin 2.5mg  daily except 5mg  on sundays   2. Next INR: 2 weeks    [name], FNP

## 2017-12-13 DIAGNOSIS — E0842 Diabetes mellitus due to underlying condition with diabetic polyneuropathy: Secondary | ICD-10-CM | POA: Diagnosis not present

## 2017-12-13 DIAGNOSIS — G2581 Restless legs syndrome: Secondary | ICD-10-CM | POA: Diagnosis not present

## 2017-12-13 DIAGNOSIS — M5412 Radiculopathy, cervical region: Secondary | ICD-10-CM | POA: Diagnosis not present

## 2017-12-13 DIAGNOSIS — M25512 Pain in left shoulder: Secondary | ICD-10-CM | POA: Diagnosis not present

## 2017-12-14 ENCOUNTER — Ambulatory Visit (INDEPENDENT_AMBULATORY_CARE_PROVIDER_SITE_OTHER): Payer: Medicare HMO | Admitting: Internal Medicine

## 2017-12-14 ENCOUNTER — Encounter: Payer: Self-pay | Admitting: Internal Medicine

## 2017-12-14 ENCOUNTER — Other Ambulatory Visit: Payer: Self-pay | Admitting: Nurse Practitioner

## 2017-12-14 VITALS — BP 130/70 | HR 66 | Ht 66.0 in | Wt 236.0 lb

## 2017-12-14 DIAGNOSIS — I481 Persistent atrial fibrillation: Secondary | ICD-10-CM

## 2017-12-14 DIAGNOSIS — I442 Atrioventricular block, complete: Secondary | ICD-10-CM | POA: Diagnosis not present

## 2017-12-14 DIAGNOSIS — I4819 Other persistent atrial fibrillation: Secondary | ICD-10-CM

## 2017-12-14 DIAGNOSIS — Z950 Presence of cardiac pacemaker: Secondary | ICD-10-CM | POA: Diagnosis not present

## 2017-12-14 DIAGNOSIS — E785 Hyperlipidemia, unspecified: Secondary | ICD-10-CM

## 2017-12-14 NOTE — Patient Instructions (Addendum)
Medication Instructions:  Your physician recommends that you continue on your current medications as directed. Please refer to the Current Medication list given to you today.  Labwork: None ordered.  Testing/Procedures: None ordered.  Follow-Up: Your physician wants you to follow-up in: as needed with Dr. Ladona Ridgelaylor.    Continue to follow up with Dr. Graciela HusbandsKlein (July 2019)  Remote monitoring is used to monitor your Pacemaker from home. This monitoring reduces the number of office visits required to check your device to one time per year. It allows us to keep an eye on the functioning of your device to ensure it is working properly. You are scheduled for a device check from home on 01/14/2018. You may send your transmission at any time that day. If you have a wireless device, the transmission will be sent automatically. After your physician reviews your transmission, you will receive a postcard with your next transmission date.  Any Other Special Instructions Will Be Listed Below (If Applicable).  If you need a refill on your cardiac medications before your next appointment, please call your pharmacy.
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
## 2017-12-15 LAB — CUP PACEART INCLINIC DEVICE CHECK
Brady Statistic RA Percent Paced: 84 %
Implantable Lead Implant Date: 20100913
Implantable Lead Location: 753860
Lead Channel Pacing Threshold Pulse Width: 0.5 ms
Lead Channel Pacing Threshold Pulse Width: 0.5 ms
Lead Channel Sensing Intrinsic Amplitude: 3.3 mV
Lead Channel Sensing Intrinsic Amplitude: 9.1 mV
Lead Channel Setting Pacing Amplitude: 2.5 V
Lead Channel Setting Pacing Pulse Width: 0.5 ms
Lead Channel Setting Sensing Sensitivity: 4 mV
MDC IDC LEAD IMPLANT DT: 20150114
MDC IDC LEAD LOCATION: 753859
MDC IDC MSMT LEADCHNL RA PACING THRESHOLD AMPLITUDE: 1.25 V
MDC IDC MSMT LEADCHNL RV PACING THRESHOLD AMPLITUDE: 0.75 V
MDC IDC PG IMPLANT DT: 20150114
MDC IDC SESS DTM: 20190413113655
MDC IDC SET LEADCHNL RA PACING AMPLITUDE: 2 V
MDC IDC STAT BRADY RV PERCENT PACED: 99 %
Pulse Gen Serial Number: 7586540

## 2017-12-18 ENCOUNTER — Ambulatory Visit (INDEPENDENT_AMBULATORY_CARE_PROVIDER_SITE_OTHER): Payer: Medicare HMO | Admitting: Nurse Practitioner

## 2017-12-18 ENCOUNTER — Encounter: Payer: Self-pay | Admitting: Nurse Practitioner

## 2017-12-18 VITALS — BP 134/73 | HR 60 | Temp 97.5°F | Ht 66.0 in | Wt 238.0 lb

## 2017-12-18 DIAGNOSIS — Z952 Presence of prosthetic heart valve: Secondary | ICD-10-CM

## 2017-12-18 DIAGNOSIS — I480 Paroxysmal atrial fibrillation: Secondary | ICD-10-CM | POA: Diagnosis not present

## 2017-12-18 DIAGNOSIS — G2581 Restless legs syndrome: Secondary | ICD-10-CM

## 2017-12-18 DIAGNOSIS — E039 Hypothyroidism, unspecified: Secondary | ICD-10-CM | POA: Diagnosis not present

## 2017-12-18 LAB — COAGUCHEK XS/INR WAIVED
INR: 2.2 — ABNORMAL HIGH (ref 0.9–1.1)
PROTHROMBIN TIME: 26.8 s

## 2017-12-18 NOTE — Progress Notes (Signed)
Subjective:   Patient in today for inr only   Indication: atrial fibrillation Bleeding signs/symptoms: None Thromboembolic signs/symptoms: None  Missed Coumadin doses: None Medication changes: no Dietary changes: no Bacterial/viral infection: no Other concerns: no  The following portions of the patient's history were reviewed and updated as appropriate: allergies, current medications, past family history, past medical history, past social history, past surgical history and problem list.  Review of Systems Pertinent items are noted in HPI.   Objective:    INR Today: 2.2 Current dose: coumadin 2.5mg  daily except 5mg  on sundays    Assessment:    Subtherapeutic INR for goal of 2.5-3.0  Plan:    1. New dose: coumadin 2.5mg  daily except 5mg  on sundays and wednesdays   2. Next INR: 1 month    [name], FNP

## 2017-12-19 LAB — ANEMIA PROFILE B
BASOS ABS: 0 10*3/uL (ref 0.0–0.2)
Basos: 0 %
EOS (ABSOLUTE): 0.3 10*3/uL (ref 0.0–0.4)
Eos: 4 %
Ferritin: 52 ng/mL (ref 30–400)
Hematocrit: 36.3 % — ABNORMAL LOW (ref 37.5–51.0)
Hemoglobin: 11.4 g/dL — ABNORMAL LOW (ref 13.0–17.7)
IMMATURE GRANULOCYTES: 0 %
Immature Grans (Abs): 0 10*3/uL (ref 0.0–0.1)
Iron Saturation: 10 % — ABNORMAL LOW (ref 15–55)
Iron: 33 ug/dL — ABNORMAL LOW (ref 38–169)
LYMPHS ABS: 1.3 10*3/uL (ref 0.7–3.1)
Lymphs: 17 %
MCH: 28.4 pg (ref 26.6–33.0)
MCHC: 31.4 g/dL — AB (ref 31.5–35.7)
MCV: 90 fL (ref 79–97)
MONOS ABS: 0.8 10*3/uL (ref 0.1–0.9)
Monocytes: 11 %
NEUTROS PCT: 68 %
Neutrophils Absolute: 5.2 10*3/uL (ref 1.4–7.0)
PLATELETS: 178 10*3/uL (ref 150–379)
RBC: 4.02 x10E6/uL — ABNORMAL LOW (ref 4.14–5.80)
RDW: 16.5 % — ABNORMAL HIGH (ref 12.3–15.4)
Retic Ct Pct: 1.7 % (ref 0.6–2.6)
TIBC: 317 ug/dL (ref 250–450)
UIBC: 284 ug/dL (ref 111–343)
Vitamin B-12: 513 pg/mL (ref 232–1245)
WBC: 7.5 10*3/uL (ref 3.4–10.8)

## 2017-12-19 LAB — THYROID PANEL WITH TSH
Free Thyroxine Index: 1.9 (ref 1.2–4.9)
T3 Uptake Ratio: 29 % (ref 24–39)
T4, Total: 6.7 ug/dL (ref 4.5–12.0)
TSH: 6.24 u[IU]/mL — AB (ref 0.450–4.500)

## 2017-12-21 ENCOUNTER — Telehealth: Payer: Self-pay

## 2017-12-21 NOTE — Telephone Encounter (Signed)
-----   Message from Wiliam KeJennifer A Smith, RN sent at 12/14/2017  4:26 PM EDT ----- Regarding: in and out of afib? We saw this patient today for Dr. Graciela HusbandsKlein to discuss an extraction. We are not going to do the extraction,  But the patient indicated he was concerned he has been in and out of afib and symptomatic with it. I told him I would let you know so you can schedule a follow up with him for Dr. Graciela HusbandsKlein.  TY Boneta LucksJenny

## 2017-12-21 NOTE — Telephone Encounter (Signed)
Called pt and he has c/o anxiety and depression. He's not sure if that has anything to do with his afib but he thinks he might be out of rhythm. Sent message to scheduling for an OV with Graciela HusbandsKlein or APP.

## 2017-12-27 ENCOUNTER — Encounter: Payer: Self-pay | Admitting: Internal Medicine

## 2017-12-27 ENCOUNTER — Ambulatory Visit: Payer: Medicare HMO | Admitting: Internal Medicine

## 2017-12-27 VITALS — BP 146/90 | HR 62 | Ht 66.0 in | Wt 235.6 lb

## 2017-12-27 DIAGNOSIS — I442 Atrioventricular block, complete: Secondary | ICD-10-CM

## 2017-12-27 DIAGNOSIS — I481 Persistent atrial fibrillation: Secondary | ICD-10-CM

## 2017-12-27 DIAGNOSIS — I4819 Other persistent atrial fibrillation: Secondary | ICD-10-CM

## 2017-12-27 DIAGNOSIS — R0602 Shortness of breath: Secondary | ICD-10-CM | POA: Diagnosis not present

## 2017-12-27 DIAGNOSIS — Z950 Presence of cardiac pacemaker: Secondary | ICD-10-CM | POA: Diagnosis not present

## 2017-12-27 LAB — CUP PACEART INCLINIC DEVICE CHECK
Brady Statistic RA Percent Paced: 84 %
Brady Statistic RV Percent Paced: 99.29 %
Implantable Lead Location: 753859
Implantable Pulse Generator Implant Date: 20150114
Lead Channel Impedance Value: 300 Ohm
Lead Channel Pacing Threshold Amplitude: 0.75 V
Lead Channel Pacing Threshold Amplitude: 0.75 V
Lead Channel Pacing Threshold Amplitude: 1 V
Lead Channel Pacing Threshold Amplitude: 1 V
Lead Channel Pacing Threshold Pulse Width: 0.5 ms
Lead Channel Pacing Threshold Pulse Width: 0.8 ms
Lead Channel Sensing Intrinsic Amplitude: 3.4 mV
Lead Channel Sensing Intrinsic Amplitude: 8.4 mV
Lead Channel Setting Pacing Amplitude: 2.5 V
Lead Channel Setting Pacing Pulse Width: 0.5 ms
MDC IDC LEAD IMPLANT DT: 20100913
MDC IDC LEAD IMPLANT DT: 20150114
MDC IDC LEAD LOCATION: 753860
MDC IDC MSMT BATTERY REMAINING LONGEVITY: 69 mo
MDC IDC MSMT BATTERY VOLTAGE: 2.96 V
MDC IDC MSMT LEADCHNL RA PACING THRESHOLD PULSEWIDTH: 0.8 ms
MDC IDC MSMT LEADCHNL RV IMPEDANCE VALUE: 487.5 Ohm
MDC IDC MSMT LEADCHNL RV PACING THRESHOLD PULSEWIDTH: 0.5 ms
MDC IDC PG SERIAL: 7586540
MDC IDC SESS DTM: 20190425162606
MDC IDC SET LEADCHNL RV PACING AMPLITUDE: 2.5 V
MDC IDC SET LEADCHNL RV SENSING SENSITIVITY: 4 mV

## 2017-12-27 MED ORDER — AMLODIPINE BESYLATE 5 MG PO TABS: 2.5000 mg | ORAL_TABLET | Freq: Every day | ORAL | 3 refills | Status: DC
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
## 2017-12-27 NOTE — Patient Instructions (Addendum)
Medication Instructions:  Your physician has recommended you make the following change in your medication:   1. Take Lasix 40mg , one tablet, two times per day, for 10 days. One day 11, resume your normal dose of 40 mg once per day  2. Begin Amlodipine 2.5mg , one half tablet, once per day  Labwork: None ordered.  Testing/Procedures:  Pulmonary Function Test-- Please schedule for 3 weeks for now.   Follow-Up: Your physician recommends that you schedule a follow-up appointment in: With Dr Gibson RampMcAlhany's APP after his PFT test is performed.   One year with Dr Graciela HusbandsKlein  .Remote monitoring is used to monitor your Pacemaker of ICD from home. This monitoring reduces the number of office visits required to check your device to one time per year. It allows us to keep an eye on the functioning of your device to ensure it is working properly. You are scheduled for a device check from home on 01/14/2018. You may send your transmission at any time that day. If you have a wireless device, the transmission will be sent automatically. After your physician reviews your transmission, you will receive a postcard with your next transmission date.     Any Other Special Instructions Will Be Listed Below (If Applicable).     If you need a refill on your cardiac medications before your next appointment, please call your pharmacy.

## 2018-01-08 ENCOUNTER — Other Ambulatory Visit: Payer: Self-pay | Admitting: Nurse Practitioner

## 2018-01-08 ENCOUNTER — Telehealth: Payer: Self-pay | Admitting: Nurse Practitioner

## 2018-01-08 MED ORDER — CLINDAMYCIN HCL 300 MG PO CAPS: 300.0000 mg | ORAL_CAPSULE | Freq: Four times a day (QID) | ORAL | 0 refills | Status: AC

## 2018-01-08 NOTE — Telephone Encounter (Signed)
PT is calling wanting to see if we can call in antibiotic to [facility] for tooth infection

## 2018-01-08 NOTE — Telephone Encounter (Signed)
When is he going to see dentist?

## 2018-01-08 NOTE — Telephone Encounter (Signed)
His wife said he does not have a dentist appointment and does not see one because he does not have many teeth (?)  She said the side of the jaw where it is swollen he only haves a small part of his tooth there.

## 2018-01-14 ENCOUNTER — Ambulatory Visit (INDEPENDENT_AMBULATORY_CARE_PROVIDER_SITE_OTHER): Payer: Medicare HMO | Admitting: *Deleted

## 2018-01-14 DIAGNOSIS — I442 Atrioventricular block, complete: Secondary | ICD-10-CM

## 2018-01-15 NOTE — Progress Notes (Signed)
Remote pacemaker transmission.   

## 2018-01-16 ENCOUNTER — Encounter: Payer: Self-pay | Admitting: Cardiology

## 2018-01-16 LAB — CUP PACEART REMOTE DEVICE CHECK
Battery Voltage: 2.96 V
Brady Statistic AP VP Percent: 85 %
Brady Statistic RA Percent Paced: 86 %
Brady Statistic RV Percent Paced: 99 %
Date Time Interrogation Session: 20190513104711
Implantable Lead Implant Date: 20150114
Implantable Lead Location: 753859
Implantable Pulse Generator Implant Date: 20150114
Lead Channel Impedance Value: 490 Ohm
Lead Channel Pacing Threshold Amplitude: 1 V
Lead Channel Pacing Threshold Pulse Width: 0.8 ms
Lead Channel Setting Pacing Pulse Width: 0.5 ms
Lead Channel Setting Sensing Sensitivity: 4 mV
MDC IDC LEAD IMPLANT DT: 20100913
MDC IDC LEAD LOCATION: 753860
MDC IDC MSMT BATTERY REMAINING LONGEVITY: 74 mo
MDC IDC MSMT BATTERY REMAINING PERCENTAGE: 95.5 %
MDC IDC MSMT LEADCHNL RA IMPEDANCE VALUE: 260 Ohm
MDC IDC MSMT LEADCHNL RA SENSING INTR AMPL: 2.3 mV
MDC IDC MSMT LEADCHNL RV PACING THRESHOLD AMPLITUDE: 0.75 V
MDC IDC MSMT LEADCHNL RV PACING THRESHOLD PULSEWIDTH: 0.5 ms
MDC IDC MSMT LEADCHNL RV SENSING INTR AMPL: 9.9 mV
MDC IDC PG SERIAL: 7586540
MDC IDC SET LEADCHNL RA PACING AMPLITUDE: 2.5 V
MDC IDC SET LEADCHNL RV PACING AMPLITUDE: 2.5 V
MDC IDC STAT BRADY AP VS PERCENT: 1 %
MDC IDC STAT BRADY AS VP PERCENT: 15 %
MDC IDC STAT BRADY AS VS PERCENT: 1 %

## 2018-01-30 ENCOUNTER — Ambulatory Visit: Payer: Medicare HMO | Admitting: Cardiology

## 2018-01-31 ENCOUNTER — Encounter: Payer: Self-pay | Admitting: Nurse Practitioner

## 2018-01-31 ENCOUNTER — Ambulatory Visit (INDEPENDENT_AMBULATORY_CARE_PROVIDER_SITE_OTHER): Payer: Medicare HMO | Admitting: Nurse Practitioner

## 2018-01-31 DIAGNOSIS — I480 Paroxysmal atrial fibrillation: Secondary | ICD-10-CM | POA: Diagnosis not present

## 2018-01-31 DIAGNOSIS — Z952 Presence of prosthetic heart valve: Secondary | ICD-10-CM

## 2018-01-31 LAB — COAGUCHEK XS/INR WAIVED
INR: 2.8 — AB (ref 0.9–1.1)
PROTHROMBIN TIME: 33.9 s

## 2018-01-31 NOTE — Progress Notes (Signed)
Patient ID: Andre Malone, male   DOB: Jan 24, 1951, 67 y.o.   MRN: [id number]  Subjective:    CHIEF COMPLAINT: INR recheck   Indication: atrial fibrillation Bleeding signs/symptoms: None Thromboembolic signs/symptoms: None  Missed Coumadin doses: None Medication changes: no Dietary changes: no Bacterial/viral infection: no Other concerns: no  The following portions of the patient's history were reviewed and updated as appropriate: allergies, current medications, past family history, past medical history, past social history, past surgical history and problem list.  Review of Systems Pertinent items noted in HPI and remainder of comprehensive ROS otherwise negative.   Objective:    INR Today: 2.8 Current dose: coumadin 2.5mg  daily except  on sun and wed    Assessment:    Therapeutic INR for goal of 2.5-3.0   Plan:    1. New dose: no change   2. Next INR: 1 month    [name] Daphine Deutscher, FNP

## 2018-02-11 ENCOUNTER — Other Ambulatory Visit: Payer: Self-pay | Admitting: Nurse Practitioner

## 2018-02-11 DIAGNOSIS — E0842 Diabetes mellitus due to underlying condition with diabetic polyneuropathy: Secondary | ICD-10-CM | POA: Diagnosis not present

## 2018-02-11 DIAGNOSIS — G2581 Restless legs syndrome: Secondary | ICD-10-CM | POA: Diagnosis not present

## 2018-02-11 DIAGNOSIS — M5412 Radiculopathy, cervical region: Secondary | ICD-10-CM | POA: Diagnosis not present

## 2018-02-11 DIAGNOSIS — I359 Nonrheumatic aortic valve disorder, unspecified: Secondary | ICD-10-CM

## 2018-02-11 DIAGNOSIS — M25512 Pain in left shoulder: Secondary | ICD-10-CM | POA: Diagnosis not present

## 2018-03-10 ENCOUNTER — Other Ambulatory Visit: Payer: Self-pay | Admitting: Nurse Practitioner

## 2018-03-10 DIAGNOSIS — E785 Hyperlipidemia, unspecified: Secondary | ICD-10-CM

## 2018-03-11 NOTE — Telephone Encounter (Signed)
Last refill without being seen 

## 2018-04-02 ENCOUNTER — Other Ambulatory Visit: Payer: Self-pay | Admitting: Nurse Practitioner

## 2018-04-11 DIAGNOSIS — M5412 Radiculopathy, cervical region: Secondary | ICD-10-CM | POA: Diagnosis not present

## 2018-04-11 DIAGNOSIS — E0842 Diabetes mellitus due to underlying condition with diabetic polyneuropathy: Secondary | ICD-10-CM | POA: Diagnosis not present

## 2018-04-11 DIAGNOSIS — G2581 Restless legs syndrome: Secondary | ICD-10-CM | POA: Diagnosis not present

## 2018-04-11 DIAGNOSIS — M25512 Pain in left shoulder: Secondary | ICD-10-CM | POA: Diagnosis not present

## 2018-04-11 DIAGNOSIS — Z79891 Long term (current) use of opiate analgesic: Secondary | ICD-10-CM | POA: Diagnosis not present

## 2018-04-15 ENCOUNTER — Ambulatory Visit (INDEPENDENT_AMBULATORY_CARE_PROVIDER_SITE_OTHER): Payer: Medicare HMO | Admitting: *Deleted

## 2018-04-15 DIAGNOSIS — I442 Atrioventricular block, complete: Secondary | ICD-10-CM | POA: Diagnosis not present

## 2018-04-16 ENCOUNTER — Encounter: Payer: Self-pay | Admitting: Cardiology

## 2018-04-16 NOTE — Progress Notes (Signed)
Letter  

## 2018-04-16 NOTE — Progress Notes (Signed)
Remote pacemaker transmission.   

## 2018-05-08 ENCOUNTER — Other Ambulatory Visit: Payer: Self-pay | Admitting: Cardiology

## 2018-05-10 ENCOUNTER — Other Ambulatory Visit: Payer: Self-pay | Admitting: Nurse Practitioner

## 2018-05-10 DIAGNOSIS — I359 Nonrheumatic aortic valve disorder, unspecified: Secondary | ICD-10-CM

## 2018-05-10 LAB — CUP PACEART REMOTE DEVICE CHECK
Brady Statistic AP VP Percent: 78 %
Brady Statistic AP VS Percent: 1 %
Brady Statistic AS VP Percent: 21 %
Brady Statistic AS VS Percent: 1 %
Implantable Lead Implant Date: 20150114
Implantable Lead Location: 753859
Lead Channel Impedance Value: 490 Ohm
Lead Channel Pacing Threshold Amplitude: 1 V
Lead Channel Pacing Threshold Pulse Width: 0.5 ms
Lead Channel Pacing Threshold Pulse Width: 0.8 ms
Lead Channel Setting Sensing Sensitivity: 4 mV
MDC IDC LEAD IMPLANT DT: 20100913
MDC IDC LEAD LOCATION: 753860
MDC IDC MSMT BATTERY REMAINING LONGEVITY: 76 mo
MDC IDC MSMT BATTERY REMAINING PERCENTAGE: 95.5 %
MDC IDC MSMT BATTERY VOLTAGE: 2.96 V
MDC IDC MSMT LEADCHNL RA IMPEDANCE VALUE: 290 Ohm
MDC IDC MSMT LEADCHNL RA SENSING INTR AMPL: 2.4 mV
MDC IDC MSMT LEADCHNL RV PACING THRESHOLD AMPLITUDE: 0.75 V
MDC IDC MSMT LEADCHNL RV SENSING INTR AMPL: 7.4 mV
MDC IDC PG IMPLANT DT: 20150114
MDC IDC SESS DTM: 20190812060018
MDC IDC SET LEADCHNL RA PACING AMPLITUDE: 2.5 V
MDC IDC SET LEADCHNL RV PACING AMPLITUDE: 2.5 V
MDC IDC SET LEADCHNL RV PACING PULSEWIDTH: 0.5 ms
MDC IDC STAT BRADY RA PERCENT PACED: 81 %
MDC IDC STAT BRADY RV PERCENT PACED: 99 %
Pulse Gen Model: 2240
Pulse Gen Serial Number: 7586540

## 2018-05-10 NOTE — Telephone Encounter (Signed)
Last seen 01/31/18  MMM 

## 2018-05-10 NOTE — Telephone Encounter (Signed)
No INR since 5/30, must be seen ASAP, can you call to set up appt for today or Monday?

## 2018-05-11 NOTE — Telephone Encounter (Signed)
Appt made

## 2018-05-14 ENCOUNTER — Ambulatory Visit (INDEPENDENT_AMBULATORY_CARE_PROVIDER_SITE_OTHER): Payer: Medicare HMO | Admitting: Nurse Practitioner

## 2018-05-14 ENCOUNTER — Ambulatory Visit: Payer: Medicare HMO | Admitting: Nurse Practitioner

## 2018-05-14 ENCOUNTER — Encounter: Payer: Self-pay | Admitting: Nurse Practitioner

## 2018-05-14 VITALS — BP 129/70 | HR 60 | Temp 96.9°F | Ht 66.0 in | Wt 227.0 lb

## 2018-05-14 DIAGNOSIS — Z952 Presence of prosthetic heart valve: Secondary | ICD-10-CM

## 2018-05-14 DIAGNOSIS — Z125 Encounter for screening for malignant neoplasm of prostate: Secondary | ICD-10-CM | POA: Diagnosis not present

## 2018-05-14 DIAGNOSIS — I10 Essential (primary) hypertension: Secondary | ICD-10-CM | POA: Diagnosis not present

## 2018-05-14 DIAGNOSIS — I480 Paroxysmal atrial fibrillation: Secondary | ICD-10-CM | POA: Diagnosis not present

## 2018-05-14 LAB — BAYER DCA HB A1C WAIVED: HB A1C (BAYER DCA - WAIVED): 6.2 % (ref <7.0)

## 2018-05-14 LAB — COAGUCHEK XS/INR WAIVED
INR: 1.4 — AB (ref 0.9–1.1)
PROTHROMBIN TIME: 16.9 s

## 2018-05-14 NOTE — Progress Notes (Signed)
Subjective:   Chief Complaint; INR recheck   Indication: atrial fibrillation Bleeding signs/symptoms: None Thromboembolic signs/symptoms: None  Missed Coumadin doses: None Medication changes: no Dietary changes: no Bacterial/viral infection: no Other concerns: no  The following portions of the patient's history were reviewed and updated as appropriate: allergies, current medications, past family history, past medical history, past social history, past surgical history and problem list.  Review of Systems Pertinent items noted in HPI and remainder of comprehensive ROS otherwise negative.   Objective:    INR Today: 1.4 Current dose: coumadin 2.5mg  daily except 5mg  on wed and sunday    Assessment:    Subtherapeutic INR for goal of 2.5-3.0   Plan:    1. New dose: coumadin 5mg  daily except 2.5mg  on tues, thurs and sat   2. Next INR: 2 weeks    [name] Daphine Deutscher, FNP

## 2018-05-15 LAB — CMP14+EGFR
ALBUMIN: 4.4 g/dL (ref 3.6–4.8)
ALT: 24 IU/L (ref 0–44)
AST: 39 IU/L (ref 0–40)
Albumin/Globulin Ratio: 1.6 (ref 1.2–2.2)
Alkaline Phosphatase: 74 IU/L (ref 39–117)
BILIRUBIN TOTAL: 0.4 mg/dL (ref 0.0–1.2)
BUN / CREAT RATIO: 20 (ref 10–24)
BUN: 25 mg/dL (ref 8–27)
CALCIUM: 9.3 mg/dL (ref 8.6–10.2)
CHLORIDE: 103 mmol/L (ref 96–106)
CO2: 24 mmol/L (ref 20–29)
Creatinine, Ser: 1.26 mg/dL (ref 0.76–1.27)
GFR, EST AFRICAN AMERICAN: 68 mL/min/{1.73_m2} (ref >59)
GFR, EST NON AFRICAN AMERICAN: 59 mL/min/{1.73_m2} — AB (ref >59)
GLOBULIN, TOTAL: 2.7 g/dL (ref 1.5–4.5)
Glucose: 78 mg/dL (ref 65–99)
Potassium: 4.3 mmol/L (ref 3.5–5.2)
Sodium: 143 mmol/L (ref 134–144)
Total Protein: 7.1 g/dL (ref 6.0–8.5)

## 2018-05-15 LAB — LIPID PANEL
CHOL/HDL RATIO: 4.1 ratio (ref 0.0–5.0)
Cholesterol, Total: 199 mg/dL (ref 100–199)
HDL: 48 mg/dL (ref >39)
LDL Calculated: 124 mg/dL — ABNORMAL HIGH (ref 0–99)
Triglycerides: 135 mg/dL (ref 0–149)
VLDL CHOLESTEROL CAL: 27 mg/dL (ref 5–40)

## 2018-05-15 LAB — PSA, TOTAL AND FREE
PSA FREE: 0.18 ng/mL
PSA, Free Pct: 30 %
Prostate Specific Ag, Serum: 0.6 ng/mL (ref 0.0–4.0)

## 2018-05-28 ENCOUNTER — Encounter: Payer: Self-pay | Admitting: Nurse Practitioner

## 2018-05-28 ENCOUNTER — Ambulatory Visit (INDEPENDENT_AMBULATORY_CARE_PROVIDER_SITE_OTHER): Payer: Medicare HMO | Admitting: Nurse Practitioner

## 2018-05-28 VITALS — BP 141/71 | HR 64 | Temp 96.7°F | Ht 66.0 in | Wt 222.4 lb

## 2018-05-28 DIAGNOSIS — Z952 Presence of prosthetic heart valve: Secondary | ICD-10-CM | POA: Diagnosis not present

## 2018-05-28 DIAGNOSIS — I480 Paroxysmal atrial fibrillation: Secondary | ICD-10-CM | POA: Diagnosis not present

## 2018-05-28 LAB — COAGUCHEK XS/INR WAIVED
INR: 3.9 — ABNORMAL HIGH (ref 0.9–1.1)
Prothrombin Time: 46.5 s

## 2018-05-28 NOTE — Progress Notes (Signed)
Subjective:   Chief Complaint: INR recheck   Indication: atrial fibrillation Bleeding signs/symptoms: None Thromboembolic signs/symptoms: None  Missed Coumadin doses: None Medication changes: no Dietary changes: no Bacterial/viral infection: no Other concerns: yes - under a lot of stress with is wife stating chemo for uterine cancer  The following portions of the patient's history were reviewed and updated as appropriate: allergies, current medications, past family history, past medical history, past social history, past surgical history and problem list.  Review of Systems Pertinent items noted in HPI and remainder of comprehensive ROS otherwise negative.   Objective:    INR Today: 3.9 Current dose: coumadain 1mg  daily except 0.5mg  on tues and thus    Assessment:    Supratherapeutic INR for goal of 2.5-3.0   Plan:    1. New dose: hold today and tomorrow then coumadin 1mg  daily except0.5mg  on sun, tues and thurs   2. Next INR: 2 weeks    [name], FNP

## 2018-06-03 ENCOUNTER — Ambulatory Visit: Payer: Medicare HMO | Admitting: Nurse Practitioner

## 2018-06-04 ENCOUNTER — Other Ambulatory Visit: Payer: Self-pay | Admitting: Nurse Practitioner

## 2018-06-04 DIAGNOSIS — E785 Hyperlipidemia, unspecified: Secondary | ICD-10-CM

## 2018-06-11 ENCOUNTER — Other Ambulatory Visit: Payer: Self-pay | Admitting: Cardiology

## 2018-06-13 ENCOUNTER — Ambulatory Visit (INDEPENDENT_AMBULATORY_CARE_PROVIDER_SITE_OTHER): Payer: Medicare HMO | Admitting: Nurse Practitioner

## 2018-06-13 DIAGNOSIS — Z952 Presence of prosthetic heart valve: Secondary | ICD-10-CM

## 2018-06-13 DIAGNOSIS — I480 Paroxysmal atrial fibrillation: Secondary | ICD-10-CM

## 2018-06-13 DIAGNOSIS — Z23 Encounter for immunization: Secondary | ICD-10-CM

## 2018-06-13 LAB — COAGUCHEK XS/INR WAIVED
INR: 3.1 — AB (ref 0.9–1.1)
PROTHROMBIN TIME: 36.8 s

## 2018-06-13 NOTE — Addendum Note (Signed)
Addended by: Cleda Daub on: 06/13/2018 03:51 PM   Modules accepted: Orders

## 2018-06-13 NOTE — Progress Notes (Signed)
Subjective:   Chief Complaint: INR recheck   Indication: atrial fibrillation Bleeding signs/symptoms: None Thromboembolic signs/symptoms: None  Missed Coumadin doses: None Medication changes: no Dietary changes: no Bacterial/viral infection: no Other concerns: no  The following portions of the patient's history were reviewed and updated as appropriate: allergies, current medications, past family history, past medical history, past social history, past surgical history and problem list.  Review of Systems Pertinent items noted in HPI and remainder of comprehensive ROS otherwise negative.   Objective:    INR Today: 3.1 Current dose: coumadin 5mg  daily except 2.5 on S,T,and TH    Assessment:    Subtherapeutic INR for goal of 2.5-3.0   Plan:    1. New dose: hold meds today then back to regular dose of 5mg  daily except 2.5 mg on S,T and TH.   2. Next INR: 1 month    [name] Daphine Deutscher, FNP

## 2018-07-09 DIAGNOSIS — M5412 Radiculopathy, cervical region: Secondary | ICD-10-CM | POA: Diagnosis not present

## 2018-07-09 DIAGNOSIS — G2581 Restless legs syndrome: Secondary | ICD-10-CM | POA: Diagnosis not present

## 2018-07-09 DIAGNOSIS — E0842 Diabetes mellitus due to underlying condition with diabetic polyneuropathy: Secondary | ICD-10-CM | POA: Diagnosis not present

## 2018-07-09 DIAGNOSIS — M25512 Pain in left shoulder: Secondary | ICD-10-CM | POA: Diagnosis not present

## 2018-07-15 ENCOUNTER — Ambulatory Visit (INDEPENDENT_AMBULATORY_CARE_PROVIDER_SITE_OTHER): Payer: Medicare HMO | Admitting: *Deleted

## 2018-07-15 DIAGNOSIS — I442 Atrioventricular block, complete: Secondary | ICD-10-CM

## 2018-07-15 DIAGNOSIS — I10 Essential (primary) hypertension: Secondary | ICD-10-CM

## 2018-07-16 NOTE — Progress Notes (Signed)
Remote pacemaker transmission.   

## 2018-08-02 ENCOUNTER — Encounter: Payer: Self-pay | Admitting: Nurse Practitioner

## 2018-08-02 ENCOUNTER — Ambulatory Visit (INDEPENDENT_AMBULATORY_CARE_PROVIDER_SITE_OTHER): Payer: Medicare HMO | Admitting: Nurse Practitioner

## 2018-08-02 VITALS — BP 159/79 | HR 63 | Temp 97.1°F | Ht 66.0 in | Wt 219.0 lb

## 2018-08-02 DIAGNOSIS — I480 Paroxysmal atrial fibrillation: Secondary | ICD-10-CM | POA: Diagnosis not present

## 2018-08-02 DIAGNOSIS — Z952 Presence of prosthetic heart valve: Secondary | ICD-10-CM | POA: Diagnosis not present

## 2018-08-02 LAB — COAGUCHEK XS/INR WAIVED
INR: 2.1 — ABNORMAL HIGH (ref 0.9–1.1)
Prothrombin Time: 24.9 s

## 2018-08-02 NOTE — Progress Notes (Signed)
Subjective:   Chief Complaint: INR recheck   Indication: atrial fibrillation Bleeding signs/symptoms: None Thromboembolic signs/symptoms: None  Missed Coumadin doses: None Medication changes: no Dietary changes: no Bacterial/viral infection: no Other concerns: no  The following portions of the patient's history were reviewed and updated as appropriate: allergies, current medications, past family history, past medical history, past social history, past surgical history and problem list.  Review of Systems Pertinent items noted in HPI and remainder of comprehensive ROS otherwise negative.   Objective:    INR Today: 2.1 Current dose: coumadin 5mg  daily except 2.5Mg   Sun, tues and tthurs    Assessment:    Subtherapeutic INR for goal of 2.5-3.0   Plan:    1. New dose: coumadin 5mg  daily except 2.5mg  on tues and thurs   2. Next INR: 1 month    [name], FNP

## 2018-08-14 ENCOUNTER — Other Ambulatory Visit: Payer: Self-pay | Admitting: Nurse Practitioner

## 2018-08-15 ENCOUNTER — Ambulatory Visit: Payer: Medicare HMO | Admitting: Nurse Practitioner

## 2018-09-15 LAB — CUP PACEART REMOTE DEVICE CHECK
Battery Voltage: 2.95 V
Brady Statistic AP VP Percent: 82 %
Brady Statistic AP VS Percent: 1 %
Brady Statistic RA Percent Paced: 84 %
Brady Statistic RV Percent Paced: 99 %
Date Time Interrogation Session: 20191111070017
Implantable Lead Implant Date: 20150114
Implantable Lead Location: 753860
Lead Channel Impedance Value: 510 Ohm
Lead Channel Pacing Threshold Amplitude: 0.75 V
Lead Channel Pacing Threshold Amplitude: 1 V
Lead Channel Pacing Threshold Pulse Width: 0.5 ms
Lead Channel Pacing Threshold Pulse Width: 0.8 ms
Lead Channel Sensing Intrinsic Amplitude: 6.7 mV
Lead Channel Setting Pacing Amplitude: 2.5 V
Lead Channel Setting Pacing Amplitude: 2.5 V
Lead Channel Setting Sensing Sensitivity: 4 mV
MDC IDC LEAD IMPLANT DT: 20100913
MDC IDC LEAD LOCATION: 753859
MDC IDC MSMT BATTERY REMAINING LONGEVITY: 71 mo
MDC IDC MSMT BATTERY REMAINING PERCENTAGE: 89 %
MDC IDC MSMT LEADCHNL RA IMPEDANCE VALUE: 290 Ohm
MDC IDC MSMT LEADCHNL RA SENSING INTR AMPL: 2.9 mV
MDC IDC PG IMPLANT DT: 20150114
MDC IDC PG SERIAL: 7586540
MDC IDC SET LEADCHNL RV PACING PULSEWIDTH: 0.5 ms
MDC IDC STAT BRADY AS VP PERCENT: 17 %
MDC IDC STAT BRADY AS VS PERCENT: 1 %

## 2018-10-02 DIAGNOSIS — M25512 Pain in left shoulder: Secondary | ICD-10-CM | POA: Diagnosis not present

## 2018-10-02 DIAGNOSIS — G2581 Restless legs syndrome: Secondary | ICD-10-CM | POA: Diagnosis not present

## 2018-10-02 DIAGNOSIS — E0842 Diabetes mellitus due to underlying condition with diabetic polyneuropathy: Secondary | ICD-10-CM | POA: Diagnosis not present

## 2018-10-02 DIAGNOSIS — M5412 Radiculopathy, cervical region: Secondary | ICD-10-CM | POA: Diagnosis not present

## 2018-10-14 ENCOUNTER — Ambulatory Visit (INDEPENDENT_AMBULATORY_CARE_PROVIDER_SITE_OTHER): Payer: Medicare HMO

## 2018-10-14 DIAGNOSIS — I442 Atrioventricular block, complete: Secondary | ICD-10-CM | POA: Diagnosis not present

## 2018-10-15 LAB — CUP PACEART REMOTE DEVICE CHECK
Battery Remaining Longevity: 70 mo
Battery Remaining Percentage: 89 %
Brady Statistic AP VS Percent: 1 %
Brady Statistic AS VP Percent: 15 %
Brady Statistic AS VS Percent: 1 %
Brady Statistic RV Percent Paced: 99 %
Implantable Lead Implant Date: 20100913
Implantable Lead Location: 753860
Implantable Pulse Generator Implant Date: 20150114
Lead Channel Impedance Value: 290 Ohm
Lead Channel Pacing Threshold Amplitude: 0.75 V
Lead Channel Pacing Threshold Pulse Width: 0.5 ms
Lead Channel Sensing Intrinsic Amplitude: 2.1 mV
Lead Channel Setting Pacing Amplitude: 2.5 V
Lead Channel Setting Pacing Pulse Width: 0.5 ms
MDC IDC LEAD IMPLANT DT: 20150114
MDC IDC LEAD LOCATION: 753859
MDC IDC MSMT BATTERY VOLTAGE: 2.95 V
MDC IDC MSMT LEADCHNL RA PACING THRESHOLD AMPLITUDE: 1 V
MDC IDC MSMT LEADCHNL RA PACING THRESHOLD PULSEWIDTH: 0.8 ms
MDC IDC MSMT LEADCHNL RV IMPEDANCE VALUE: 480 Ohm
MDC IDC MSMT LEADCHNL RV SENSING INTR AMPL: 7.9 mV
MDC IDC PG SERIAL: 7586540
MDC IDC SESS DTM: 20200210070019
MDC IDC SET LEADCHNL RA PACING AMPLITUDE: 2.5 V
MDC IDC SET LEADCHNL RV SENSING SENSITIVITY: 4 mV
MDC IDC STAT BRADY AP VP PERCENT: 84 %
MDC IDC STAT BRADY RA PERCENT PACED: 86 %

## 2018-10-24 IMAGING — DX DG CHEST 2V
2 series · 2 of 2 positions shown · non-contrast
Comparison: September 18, 2013

CLINICAL DATA: Shortness of breath and chest heaviness

EXAM:
CHEST  2 VIEW

[chest pa]
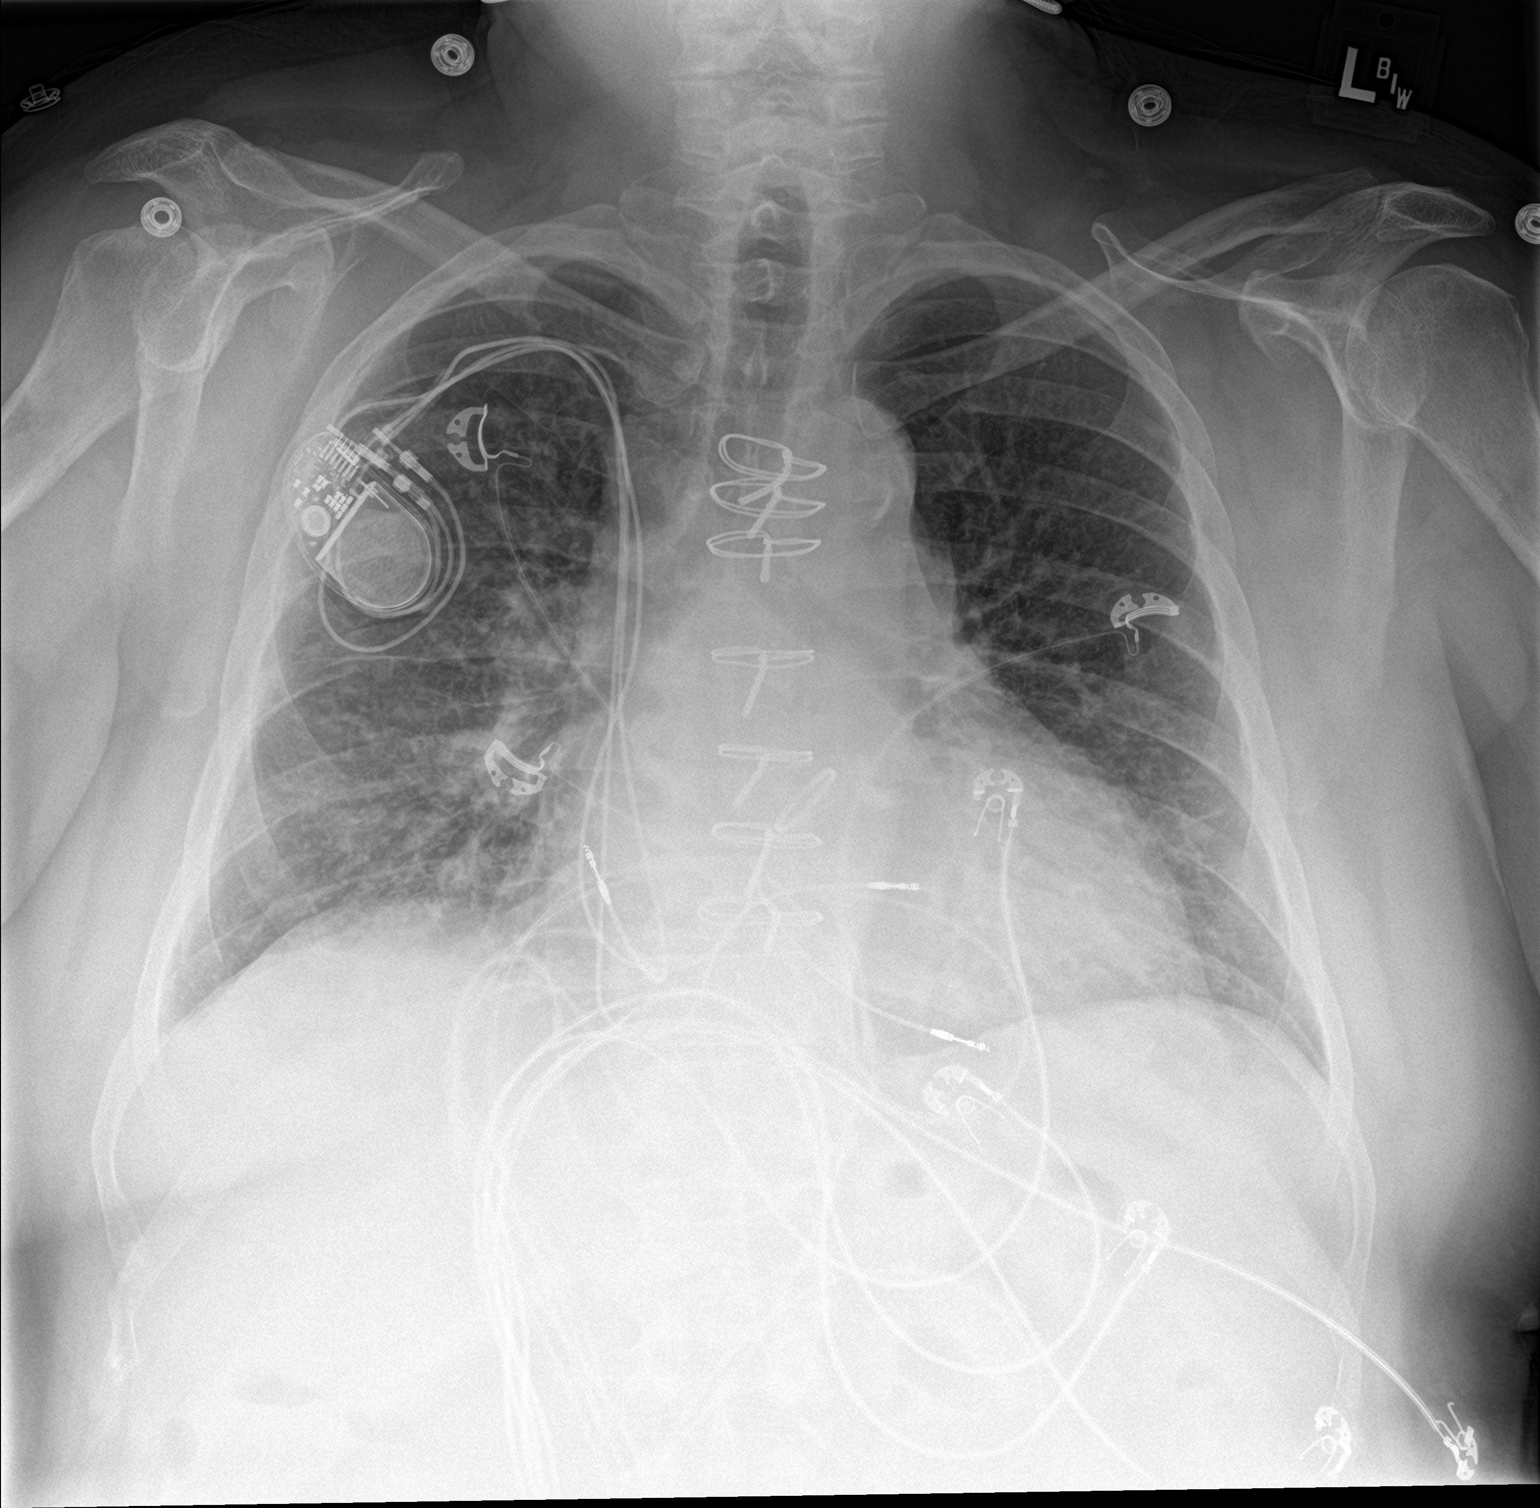

[chest lat]
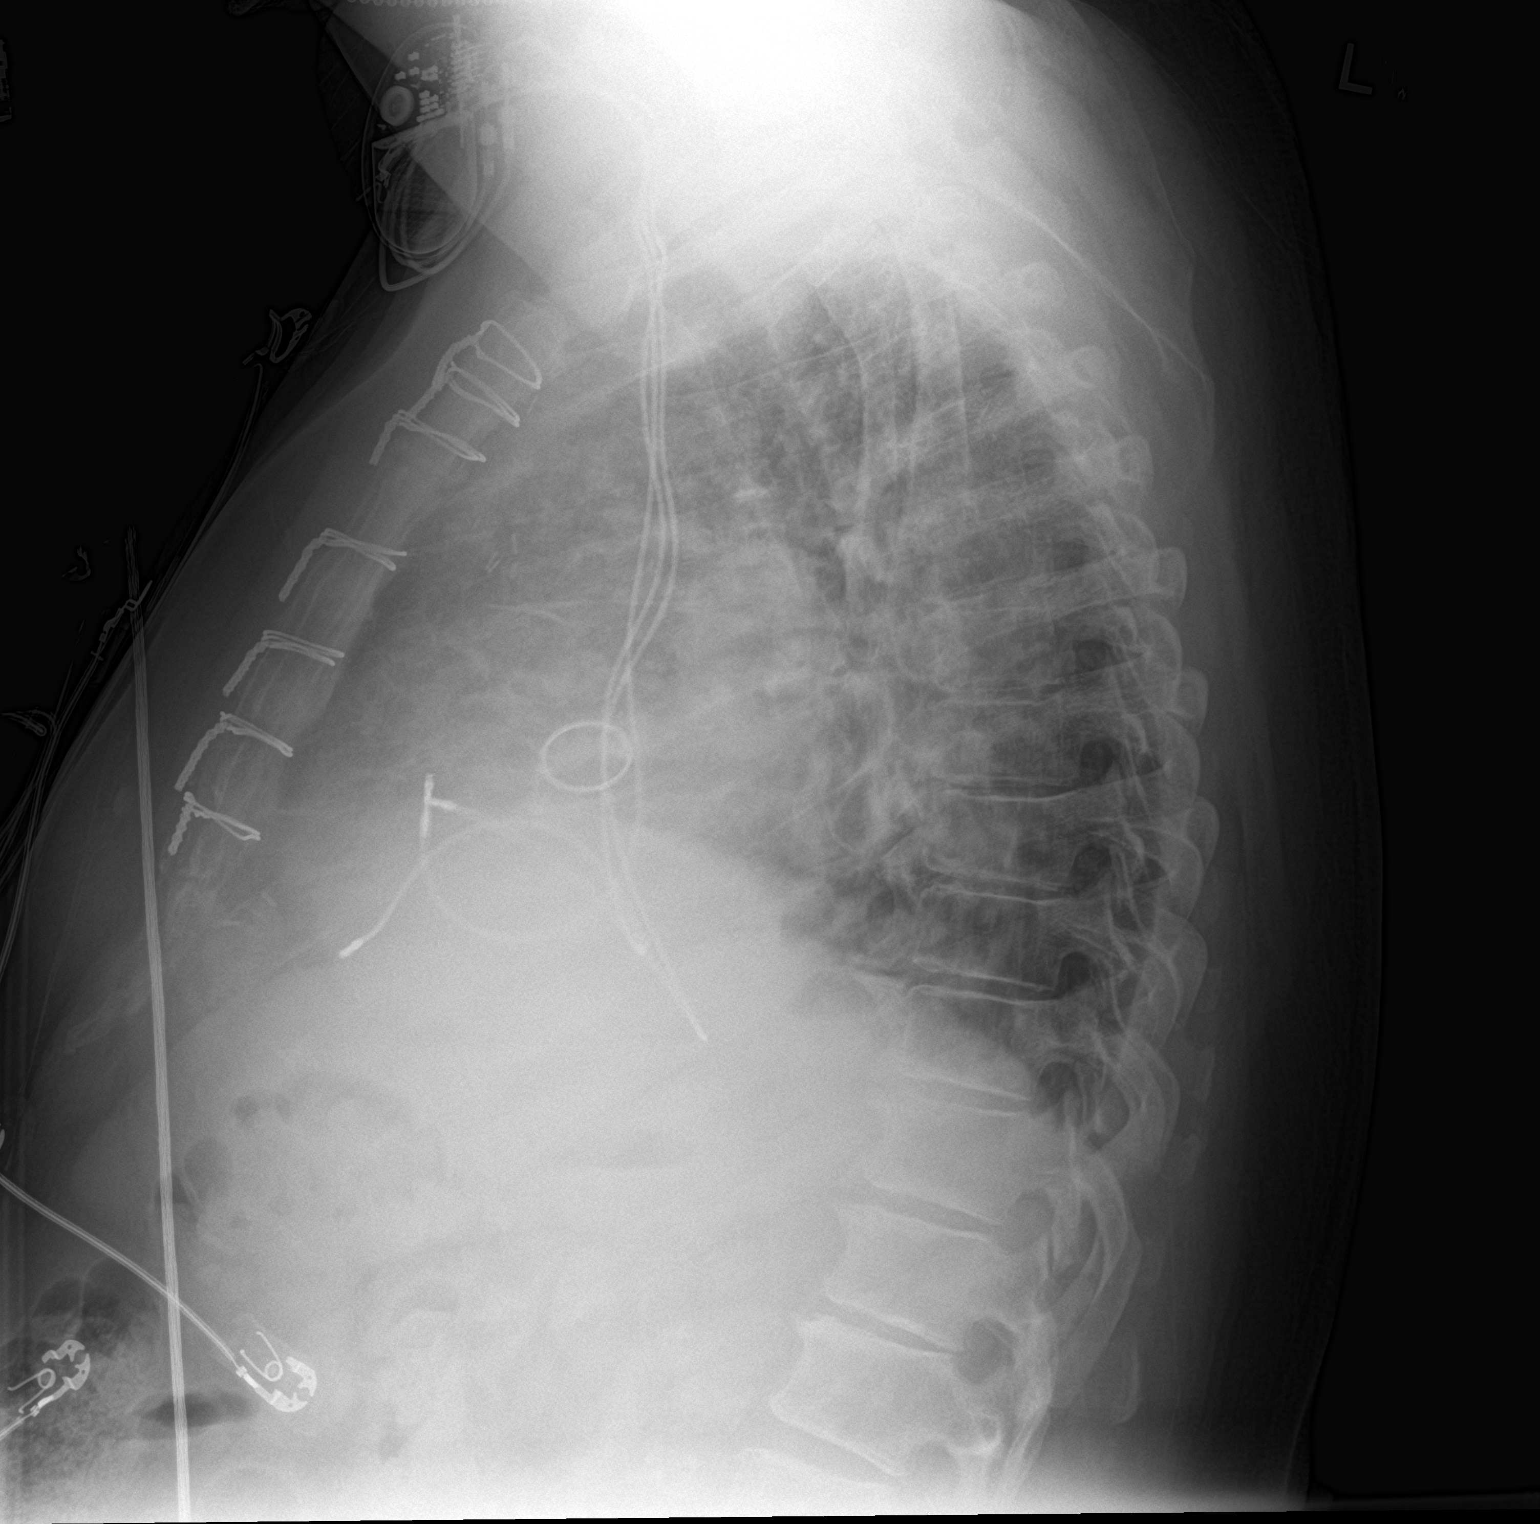

[2 of 2 positions shown; findings below may reference images not displayed]

FINDINGS: Pacemaker leads are attached the right atrium and right ventricle.
There is an aortic valve replacement. Heart is mildly enlarged with
pulmonary vascularity within normal limits. There is aortic
atherosclerosis.

There is mild interstitial prominence. No pleural effusion is
evident. No airspace consolidation. No adenopathy appreciable. No
evident bone lesions.
IMPRESSION: Suspect mild interstitial edema. No airspace consolidation. There is
mild cardiomegaly with the pulmonary vascularity appearing within
normal limits. Postoperative changes as noted. There is aortic
atherosclerosis.

Aortic Atherosclerosis ([id number].G).

## 2018-10-28 NOTE — Progress Notes (Signed)
Remote pacemaker transmission.   

## 2018-11-08 ENCOUNTER — Telehealth: Payer: Self-pay | Admitting: Nurse Practitioner

## 2018-11-08 NOTE — Telephone Encounter (Signed)
Patient states that he and his wife have had the stomach bug for about a week and his diarrhea has gotten better but his wife is still having a hard time. Advised to continue with Immodium and pepto and avoid dairy and try to push clear fluids. Patient verbalized understanding and will contact us if patient does not get better over the weekend.

## 2018-11-14 ENCOUNTER — Other Ambulatory Visit: Payer: Self-pay | Admitting: *Deleted

## 2018-11-14 MED ORDER — LEVOTHYROXINE SODIUM 100 MCG PO TABS: 100.0000 ug | ORAL_TABLET | Freq: Every day | ORAL | 0 refills | Status: DC

## 2018-11-19 ENCOUNTER — Other Ambulatory Visit: Payer: Self-pay | Admitting: Nurse Practitioner

## 2018-11-19 DIAGNOSIS — F3342 Major depressive disorder, recurrent, in full remission: Secondary | ICD-10-CM

## 2018-11-19 MED ORDER — SERTRALINE HCL 100 MG PO TABS: 100.0000 mg | ORAL_TABLET | Freq: Every day | ORAL | 1 refills | Status: DC

## 2018-11-29 ENCOUNTER — Other Ambulatory Visit: Payer: Self-pay | Admitting: Nurse Practitioner

## 2018-11-29 DIAGNOSIS — E785 Hyperlipidemia, unspecified: Secondary | ICD-10-CM

## 2018-12-02 ENCOUNTER — Emergency Department (HOSPITAL_COMMUNITY)
Admission: EM | Admit: 2018-12-02 | Discharge: 2018-12-02 | Disposition: A | Discharge status: Home / Self Care | Payer: Medicare HMO | Attending: Emergency Medicine | Admitting: Emergency Medicine

## 2018-12-02 ENCOUNTER — Emergency Department (HOSPITAL_COMMUNITY): Payer: Medicare HMO

## 2018-12-02 ENCOUNTER — Encounter (HOSPITAL_COMMUNITY): Payer: Self-pay

## 2018-12-02 ENCOUNTER — Other Ambulatory Visit: Payer: Self-pay

## 2018-12-02 ENCOUNTER — Ambulatory Visit: Payer: Medicare HMO | Admitting: Family Medicine

## 2018-12-02 DIAGNOSIS — I252 Old myocardial infarction: Secondary | ICD-10-CM | POA: Insufficient documentation

## 2018-12-02 DIAGNOSIS — Z7982 Long term (current) use of aspirin: Secondary | ICD-10-CM | POA: Insufficient documentation

## 2018-12-02 DIAGNOSIS — E039 Hypothyroidism, unspecified: Secondary | ICD-10-CM | POA: Insufficient documentation

## 2018-12-02 DIAGNOSIS — M25511 Pain in right shoulder: Secondary | ICD-10-CM | POA: Diagnosis not present

## 2018-12-02 DIAGNOSIS — E1140 Type 2 diabetes mellitus with diabetic neuropathy, unspecified: Secondary | ICD-10-CM | POA: Diagnosis not present

## 2018-12-02 DIAGNOSIS — S4991XA Unspecified injury of right shoulder and upper arm, initial encounter: Secondary | ICD-10-CM | POA: Diagnosis not present

## 2018-12-02 DIAGNOSIS — Z950 Presence of cardiac pacemaker: Secondary | ICD-10-CM | POA: Diagnosis not present

## 2018-12-02 DIAGNOSIS — R52 Pain, unspecified: Secondary | ICD-10-CM | POA: Diagnosis not present

## 2018-12-02 DIAGNOSIS — Z794 Long term (current) use of insulin: Secondary | ICD-10-CM | POA: Insufficient documentation

## 2018-12-02 DIAGNOSIS — N189 Chronic kidney disease, unspecified: Secondary | ICD-10-CM | POA: Insufficient documentation

## 2018-12-02 DIAGNOSIS — I509 Heart failure, unspecified: Secondary | ICD-10-CM | POA: Diagnosis not present

## 2018-12-02 DIAGNOSIS — M546 Pain in thoracic spine: Secondary | ICD-10-CM | POA: Diagnosis not present

## 2018-12-02 DIAGNOSIS — I10 Essential (primary) hypertension: Secondary | ICD-10-CM | POA: Diagnosis not present

## 2018-12-02 DIAGNOSIS — R0689 Other abnormalities of breathing: Secondary | ICD-10-CM | POA: Diagnosis not present

## 2018-12-02 DIAGNOSIS — I130 Hypertensive heart and chronic kidney disease with heart failure and stage 1 through stage 4 chronic kidney disease, or unspecified chronic kidney disease: Secondary | ICD-10-CM | POA: Diagnosis not present

## 2018-12-02 DIAGNOSIS — M25111 Fistula, right shoulder: Secondary | ICD-10-CM | POA: Diagnosis not present

## 2018-12-02 DIAGNOSIS — Z7901 Long term (current) use of anticoagulants: Secondary | ICD-10-CM | POA: Diagnosis not present

## 2018-12-02 DIAGNOSIS — R079 Chest pain, unspecified: Secondary | ICD-10-CM | POA: Diagnosis not present

## 2018-12-02 DIAGNOSIS — R0902 Hypoxemia: Secondary | ICD-10-CM | POA: Diagnosis not present

## 2018-12-02 DIAGNOSIS — E1122 Type 2 diabetes mellitus with diabetic chronic kidney disease: Secondary | ICD-10-CM | POA: Diagnosis not present

## 2018-12-02 DIAGNOSIS — M25519 Pain in unspecified shoulder: Secondary | ICD-10-CM | POA: Diagnosis not present

## 2018-12-02 MED ORDER — OXYCODONE HCL 5 MG PO TABS
15.0000 mg | ORAL_TABLET | Freq: Once | ORAL | Status: AC
  Administered 2018-12-02: 15 mg via ORAL
  Filled 2018-12-02: qty 3
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
## 2018-12-02 NOTE — Discharge Instructions (Addendum)
Continue your current medications.  See your Physician for recheck if symptoms persist past one week

## 2018-12-03 ENCOUNTER — Ambulatory Visit (INDEPENDENT_AMBULATORY_CARE_PROVIDER_SITE_OTHER): Payer: Medicare HMO | Admitting: Nurse Practitioner

## 2018-12-03 ENCOUNTER — Other Ambulatory Visit: Payer: Self-pay

## 2018-12-03 ENCOUNTER — Ambulatory Visit (INDEPENDENT_AMBULATORY_CARE_PROVIDER_SITE_OTHER): Payer: Medicare HMO

## 2018-12-03 ENCOUNTER — Encounter: Payer: Self-pay | Admitting: Nurse Practitioner

## 2018-12-03 VITALS — BP 141/77 | HR 60 | Temp 97.1°F | Ht 66.0 in | Wt 215.0 lb

## 2018-12-03 DIAGNOSIS — Z952 Presence of prosthetic heart valve: Secondary | ICD-10-CM | POA: Diagnosis not present

## 2018-12-03 DIAGNOSIS — M79601 Pain in right arm: Secondary | ICD-10-CM

## 2018-12-03 DIAGNOSIS — S199XXA Unspecified injury of neck, initial encounter: Secondary | ICD-10-CM | POA: Diagnosis not present

## 2018-12-03 DIAGNOSIS — M79641 Pain in right hand: Secondary | ICD-10-CM | POA: Diagnosis not present

## 2018-12-03 DIAGNOSIS — M542 Cervicalgia: Secondary | ICD-10-CM

## 2018-12-03 DIAGNOSIS — I480 Paroxysmal atrial fibrillation: Secondary | ICD-10-CM

## 2018-12-03 DIAGNOSIS — R791 Abnormal coagulation profile: Secondary | ICD-10-CM

## 2018-12-03 DIAGNOSIS — M79631 Pain in right forearm: Secondary | ICD-10-CM | POA: Diagnosis not present

## 2018-12-03 LAB — COAGUCHEK XS/INR WAIVED
INR: 6.9 — AB (ref 0.9–1.1)
PROTHROMBIN TIME: 83.2 s

## 2018-12-03 MED ORDER — CYCLOBENZAPRINE HCL 10 MG PO TABS: 10.0000 mg | ORAL_TABLET | Freq: Three times a day (TID) | ORAL | 1 refills | Status: DC | PRN

## 2018-12-03 MED ORDER — PHYTONADIONE 5 MG PO TABS
5.0000 mg | ORAL_TABLET | Freq: Once | ORAL | Status: AC
  Administered 2018-12-03: 5 mg via ORAL
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
## 2018-12-03 NOTE — Addendum Note (Signed)
Addended by: Bennie Pierini on: 12/03/2018 12:38 PM   Modules accepted: Orders

## 2018-12-05 ENCOUNTER — Other Ambulatory Visit: Payer: Self-pay

## 2018-12-05 ENCOUNTER — Ambulatory Visit (INDEPENDENT_AMBULATORY_CARE_PROVIDER_SITE_OTHER): Payer: Medicare HMO | Admitting: Nurse Practitioner

## 2018-12-05 DIAGNOSIS — Z952 Presence of prosthetic heart valve: Secondary | ICD-10-CM

## 2018-12-05 DIAGNOSIS — I480 Paroxysmal atrial fibrillation: Secondary | ICD-10-CM

## 2018-12-05 LAB — COAGUCHEK XS/INR WAIVED
INR: 1.8 — ABNORMAL HIGH (ref 0.9–1.1)
Prothrombin Time: 21.7 s

## 2018-12-05 NOTE — Progress Notes (Signed)
Patient ID: Andre Malone, male   DOB: 1950-10-16, 68 y.o.   MRN: [id number]    Virtual Visit via telephone Note  I connected with Andre Malone on 12/05/18 at 12:30PM by telephone and verified that I am speaking with the correct person using two identifiers. Andre Malone is currently located at home and his wife is currently with her during visit. The provider, [name] Daphine Deutscher, FNP is located in their office at time of visit.  I discussed the limitations, risks, security and privacy concerns of performing an evaluation and management service by telephone and the availability of in person appointments. I also discussed with the patient that there may be a patient responsible charge related to this service. The patient expressed understanding and agreed to proceed.     Subjective:   Chief Complaint: INR recheck   Indication: atrial fibrillation Bleeding signs/symptoms: None Thromboembolic signs/symptoms: None  Missed Coumadin doses: None Medication changes:he was given vitamin K 12/03/18 and has held coumadin dose for 2 days due to INR  Of 6.8 Dietary changes: no Bacterial/viral infection: no Other concerns: no  The following portions of the patient's history were reviewed and updated as appropriate: allergies, current medications, past family history, past medical history, past social history, past surgical history and problem list.  Review of Systems Pertinent items are noted in HPI.   Objective:    INR Today: 1.8 Current dose: coumadin 5mg  daily except 2.5mg  on tues and thurs    Assessment:    Subtherapeutic INR for goal of 2.5-3.0   Plan:    1. New dose: no change  Back to coumadin 5mg  daily except 2.5mg  in tues and thurs. 2. Next INR: 2 weeks      I discussed the assessment and treatment plan with the patient. The patient was provided an opportunity to ask questions and all were answered. The patient agreed with the plan and demonstrated an understanding  of the instructions.   The patient was advised to call back or seek an in-person evaluation if the symptoms worsen or if the condition fails to improve as anticipated.  The above assessment and management plan was discussed with the patient. The patient verbalized understanding of and has agreed to the management plan. Patient is aware to call the clinic if symptoms persist or worsen. Patient is aware when to return to the clinic for a follow-up visit. Patient educated on when it is appropriate to go to the emergency department.    I provided 5 minutes of non-face-to-face time during this encounter.    [name] Daphine Deutscher, FNP

## 2018-12-14 ENCOUNTER — Other Ambulatory Visit: Payer: Self-pay | Admitting: Nurse Practitioner

## 2018-12-16 ENCOUNTER — Other Ambulatory Visit: Payer: Self-pay | Admitting: Cardiology

## 2018-12-19 ENCOUNTER — Other Ambulatory Visit: Payer: Self-pay

## 2018-12-19 ENCOUNTER — Encounter: Payer: Self-pay | Admitting: Nurse Practitioner

## 2018-12-19 ENCOUNTER — Ambulatory Visit (INDEPENDENT_AMBULATORY_CARE_PROVIDER_SITE_OTHER): Payer: Medicare HMO | Admitting: Nurse Practitioner

## 2018-12-19 VITALS — BP 147/72 | HR 61 | Temp 96.0°F | Ht 66.0 in | Wt 223.0 lb

## 2018-12-19 DIAGNOSIS — I480 Paroxysmal atrial fibrillation: Secondary | ICD-10-CM

## 2018-12-19 DIAGNOSIS — Z952 Presence of prosthetic heart valve: Secondary | ICD-10-CM

## 2018-12-19 LAB — COAGUCHEK XS/INR WAIVED
INR: 3.4 — ABNORMAL HIGH (ref 0.9–1.1)
Prothrombin Time: 40.4 s

## 2018-12-19 NOTE — Progress Notes (Signed)
Subjective:   Chief Complaint- INR recheck   Indication: atrial fibrillation Bleeding signs/symptoms: None Thromboembolic signs/symptoms: None  Missed Coumadin doses: None Medication changes: INR was 1.8 at last check 2 weeks ago. He was to continue current dose and recheck today Dietary changes: no Bacterial/viral infection: no Other concerns: no  The following portions of the patient's history were reviewed and updated as appropriate: allergies, current medications, past family history, past medical history, past social history, past surgical history and problem list.  Review of Systems Pertinent items noted in HPI and remainder of comprehensive ROS otherwise negative.   Objective:    INR Today: 3.4 Current dose: coumadin 5mg  daily except 2.5mg  on tues and thurs    Assessment:    Supratherapeutic INR for goal of 2-3   Plan:    1. New dose: coumadin 5mg  daily except 2.5 on T, Th and sat   2. Next INR: 1 month    [name] Daphine Deutscher, FNP

## 2018-12-30 DIAGNOSIS — E0842 Diabetes mellitus due to underlying condition with diabetic polyneuropathy: Secondary | ICD-10-CM | POA: Diagnosis not present

## 2018-12-30 DIAGNOSIS — M25512 Pain in left shoulder: Secondary | ICD-10-CM | POA: Diagnosis not present

## 2018-12-30 DIAGNOSIS — G2581 Restless legs syndrome: Secondary | ICD-10-CM | POA: Diagnosis not present

## 2018-12-30 DIAGNOSIS — M5412 Radiculopathy, cervical region: Secondary | ICD-10-CM | POA: Diagnosis not present

## 2019-01-06 ENCOUNTER — Telehealth: Payer: Self-pay | Admitting: Internal Medicine

## 2019-01-06 NOTE — Telephone Encounter (Signed)
New message   Spoke w/ pt and schedule pt for a virtual video visit on doxy.me with Dr. Graciela Husbands. Offered pt this week but pt could not do this week. Scheduled for Monday 05.11.20. Pt has a computer with a camera, but does not have a smart phone. Pt email is listed in appt notes.      Virtual Visit Pre-Appointment Phone Call  "(Name), I am calling you today to discuss your upcoming appointment. We are currently trying to limit exposure to the virus that causes COVID-19 by seeing patients at home rather than in the office."  1. "What is the BEST phone number to call the day of the visit?" - include this in appointment notes  2. Do you have or have access to (through a family member/friend) a smartphone with video capability that we can use for your visit?" a. If yes - list this number in appt notes as cell (if different from BEST phone #) and list the appointment type as a VIDEO visit in appointment notes b. If no - list the appointment type as a PHONE visit in appointment notes  3. Confirm consent - "In the setting of the current Covid19 crisis, you are scheduled for a (phone or video) visit with your provider on (date) at (time).  Just as we do with many in-office visits, in order for you to participate in this visit, we must obtain consent.  If you'd like, I can send this to your mychart (if signed up) or email for you to review.  Otherwise, I can obtain your verbal consent now.  All virtual visits are billed to your insurance company just like a normal visit would be.  By agreeing to a virtual visit, we'd like you to understand that the technology does not allow for your provider to perform an examination, and thus may limit your provider's ability to fully assess your condition. If your provider identifies any concerns that need to be evaluated in person, we will make arrangements to do so.  Finally, though the technology is pretty good, we cannot assure that it will always work on either your  or our end, and in the setting of a video visit, we may have to convert it to a phone-only visit.  In either situation, we cannot ensure that we have a secure connection.  Are you willing to proceed?" STAFF: Did the patient verbally acknowledge consent to telehealth visit? Document YES/NO here: YES  4. Advise patient to be prepared - "Two hours prior to your appointment, go ahead and check your blood pressure, pulse, oxygen saturation, and your weight (if you have the equipment to check those) and write them all down. When your visit starts, your provider will ask you for this information. If you have an Apple Watch or Kardia device, please plan to have heart rate information ready on the day of your appointment. Please have a pen and paper handy nearby the day of the visit as well."  5. Give patient instructions for MyChart download to smartphone OR Doximity/Doxy.me as below if video visit (depending on what platform provider is using)  6. Inform patient they will receive a phone call 15 minutes prior to their appointment time (may be from unknown caller ID) so they should be prepared to answer    TELEPHONE CALL NOTE  Andre Malone has been deemed a candidate for a follow-up tele-health visit to limit community exposure during the Covid-19 pandemic. I spoke with the patient via phone to  ensure availability of phone/video source, confirm preferred email & phone number, and discuss instructions and expectations.  I reminded Andre Malone to be prepared with any vital sign and/or heart rhythm information that could potentially be obtained via home monitoring, at the time of his visit. I reminded Andre Malone to expect a phone call prior to his visit.  Andre Malone 01/06/2019 1:39 PM   INSTRUCTIONS FOR DOWNLOADING THE MYCHART APP TO SMARTPHONE  - The patient must first make sure to have activated MyChart and know their login information - If Apple, go to [employer] Store and type in MyChart  in the search bar and download the app. If Android, ask patient to go to [employer] Play Store and type in [place] in the search bar and download the app. The app is free but as with any other app downloads, their phone may require them to verify saved payment information or Apple/Android password.  - The patient will need to then log into the app with their MyChart username and password, and select [place] as their healthcare provider to link the account. When it is time for your visit, go to the MyChart app, find appointments, and click Begin Video Visit. Be sure to Select Allow for your device to access the Microphone and Camera for your visit. You will then be connected, and your provider will be with you shortly.  **If they have any issues connecting, or need assistance please contact MyChart service desk [phone])**  **If using a computer, in order to ensure the best quality for their visit they will need to use either of the following Internet Browsers: [employer] Edge, or Google Chrome**  IF USING DOXIMITY or DOXY.ME - The patient will receive a link just prior to their visit by text.     FULL LENGTH CONSENT FOR TELE-HEALTH VISIT   I hereby voluntarily request, consent and authorize [facility] and its employed or contracted physicians, physician assistants, nurse practitioners or other licensed health care professionals (the Practitioner), to provide me with telemedicine health care services (the Services") as deemed necessary by the treating Practitioner. I acknowledge and consent to receive the Services by the Practitioner via telemedicine. I understand that the telemedicine visit will involve communicating with the Practitioner through live audiovisual communication technology and the disclosure of certain medical information by electronic transmission. I acknowledge that I have been given the opportunity to request an in-person assessment or other available alternative  prior to the telemedicine visit and am voluntarily participating in the telemedicine visit.  I understand that I have the right to withhold or withdraw my consent to the use of telemedicine in the course of my care at any time, without affecting my right to future care or treatment, and that the Practitioner or I may terminate the telemedicine visit at any time. I understand that I have the right to inspect all information obtained and/or recorded in the course of the telemedicine visit and may receive copies of available information for a reasonable fee.  I understand that some of the potential risks of receiving the Services via telemedicine include:   Delay or interruption in medical evaluation due to technological equipment failure or disruption;  Information transmitted may not be sufficient (e.g. poor resolution of images) to allow for appropriate medical decision making by the Practitioner; and/or   In rare instances, security protocols could fail, causing a breach of personal health information.  Furthermore, I acknowledge that it is my responsibility to provide  information about my medical history, conditions and care that is complete and accurate to the best of my ability. I acknowledge that Practitioner's advice, recommendations, and/or decision may be based on factors not within their control, such as incomplete or inaccurate data provided by me or distortions of diagnostic images or specimens that may result from electronic transmissions. I understand that the practice of medicine is not an exact science and that Practitioner makes no warranties or guarantees regarding treatment outcomes. I acknowledge that I will receive a copy of this consent concurrently upon execution via email to the email address I last provided but may also request a printed copy by calling the office of [facility].    I understand that my insurance will be billed for this visit.   I have read or had this  consent read to me.  I understand the contents of this consent, which adequately explains the benefits and risks of the Services being provided via telemedicine.   I have been provided ample opportunity to ask questions regarding this consent and the Services and have had my questions answered to my satisfaction.  I give my informed consent for the services to be provided through the use of telemedicine in my medical care  By participating in this telemedicine visit I agree to the above.

## 2019-01-06 NOTE — Telephone Encounter (Signed)
New message ° ° °Spoke w/ pt and schedule pt for a virtual video visit on doxy.me with Dr. Klein. Offered pt this week but pt could not do this week. Scheduled for Monday 05.11.20. Pt has a computer with a camera, but does not have a smart phone. Pt email is listed in appt notes.  ° ° ° ° °Virtual Visit Pre-Appointment Phone Call ° °"(Name), I am calling you today to discuss your upcoming appointment. We are currently trying to limit exposure to the virus that causes COVID-19 by seeing patients at home rather than in the office." ° °1. "What is the BEST phone number to call the day of the visit?" - include this in appointment notes ° °2. “Do you have or have access to (through a family member/friend) a smartphone with video capability that we can use for your visit?" °a. If yes - list this number in appt notes as “cell” (if different from BEST phone #) and list the appointment type as a VIDEO visit in appointment notes °b. If no - list the appointment type as a PHONE visit in appointment notes ° °3. Confirm consent - "In the setting of the current Covid19 crisis, you are scheduled for a (phone or video) visit with your provider on (date) at (time).  Just as we do with many in-office visits, in order for you to participate in this visit, we must obtain consent.  If you'd like, I can send this to your mychart (if signed up) or email for you to review.  Otherwise, I can obtain your verbal consent now.  All virtual visits are billed to your insurance company just like a normal visit would be.  By agreeing to a virtual visit, we'd like you to understand that the technology does not allow for your provider to perform an examination, and thus may limit your provider's ability to fully assess your condition. If your provider identifies any concerns that need to be evaluated in person, we will make arrangements to do so.  Finally, though the technology is pretty good, we cannot assure that it will always work on either your  or our end, and in the setting of a video visit, we may have to convert it to a phone-only visit.  In either situation, we cannot ensure that we have a secure connection.  Are you willing to proceed?" STAFF: Did the patient verbally acknowledge consent to telehealth visit? Document YES/NO here: YES ° °4. Advise patient to be prepared - "Two hours prior to your appointment, go ahead and check your blood pressure, pulse, oxygen saturation, and your weight (if you have the equipment to check those) and write them all down. When your visit starts, your provider will ask you for this information. If you have an Apple Watch or Kardia device, please plan to have heart rate information ready on the day of your appointment. Please have a pen and paper handy nearby the day of the visit as well." ° °5. Give patient instructions for MyChart download to smartphone OR Doximity/Doxy.me as below if video visit (depending on what platform provider is using) ° °6. Inform patient they will receive a phone call 15 minutes prior to their appointment time (may be from unknown caller ID) so they should be prepared to answer ° ° ° °TELEPHONE CALL NOTE ° °Andre Malone has been deemed a candidate for a follow-up tele-health visit to limit community exposure during the Covid-19 pandemic. I spoke with the patient via phone to   ensure availability of phone/video source, confirm preferred email & phone number, and discuss instructions and expectations.  I reminded Andre Malone to be prepared with any vital sign and/or heart rhythm information that could potentially be obtained via home monitoring, at the time of his visit. I reminded Andre Malone to expect a phone call prior to his visit. ° °Ashland P Edwards °01/06/2019 °1:39 PM ° ° °INSTRUCTIONS FOR DOWNLOADING THE MYCHART APP TO SMARTPHONE ° °- The patient must first make sure to have activated MyChart and know their login information °- If Apple, go to App Store and type in MyChart  in the search bar and download the app. If Android, ask patient to go to Google Play Store and type in MyChart in the search bar and download the app. The app is free but as with any other app downloads, their phone may require them to verify saved payment information or Apple/Android password.  °- The patient will need to then log into the app with their MyChart username and password, and select [name] as their healthcare provider to link the account. °When it is time for your visit, go to the MyChart app, find appointments, and click Begin Video Visit. Be sure to Select Allow for your device to access the Microphone and Camera for your visit. You will then be connected, and your provider will be with you shortly. ° **If they have any issues connecting, or need assistance please contact MyChart service desk [phone] ([phone])** ° **If using a computer, in order to ensure the best quality for their visit they will need to use either of the following Internet Browsers: Microsoft Edge, or Google Chrome** ° °IF USING DOXIMITY or DOXY.ME - The patient will receive a link just prior to their visit by text. ° ° ° ° °FULL LENGTH CONSENT FOR TELE-HEALTH VISIT  ° °I hereby voluntarily request, consent and authorize [facility] and its employed or contracted physicians, physician assistants, nurse practitioners or other licensed health care professionals (the Practitioner), to provide me with telemedicine health care services (the “Services") as deemed necessary by the treating Practitioner. I acknowledge and consent to receive the Services by the Practitioner via telemedicine. I understand that the telemedicine visit will involve communicating with the Practitioner through live audiovisual communication technology and the disclosure of certain medical information by electronic transmission. I acknowledge that I have been given the opportunity to request an in-person assessment or other available alternative  prior to the telemedicine visit and am voluntarily participating in the telemedicine visit. ° °I understand that I have the right to withhold or withdraw my consent to the use of telemedicine in the course of my care at any time, without affecting my right to future care or treatment, and that the Practitioner or I may terminate the telemedicine visit at any time. I understand that I have the right to inspect all information obtained and/or recorded in the course of the telemedicine visit and may receive copies of available information for a reasonable fee.  I understand that some of the potential risks of receiving the Services via telemedicine include:  °• Delay or interruption in medical evaluation due to technological equipment failure or disruption; °• Information transmitted may not be sufficient (e.g. poor resolution of images) to allow for appropriate medical decision making by the Practitioner; and/or  °• In rare instances, security protocols could fail, causing a breach of personal health information. ° °Furthermore, I acknowledge that it is my responsibility to provide   information about my medical history, conditions and care that is complete and accurate to the best of my ability. I acknowledge that Practitioner's advice, recommendations, and/or decision may be based on factors not within their control, such as incomplete or inaccurate data provided by me or distortions of diagnostic images or specimens that may result from electronic transmissions. I understand that the practice of medicine is not an exact science and that Practitioner makes no warranties or guarantees regarding treatment outcomes. I acknowledge that I will receive a copy of this consent concurrently upon execution via email to the email address I last provided but may also request a printed copy by calling the office of [facility].   ° °I understand that my insurance will be billed for this visit.  ° °I have read or had this  consent read to me. °• I understand the contents of this consent, which adequately explains the benefits and risks of the Services being provided via telemedicine.  °• I have been provided ample opportunity to ask questions regarding this consent and the Services and have had my questions answered to my satisfaction. °• I give my informed consent for the services to be provided through the use of telemedicine in my medical care ° °By participating in this telemedicine visit I agree to the above. °

## 2019-01-13 ENCOUNTER — Other Ambulatory Visit: Payer: Self-pay

## 2019-01-13 ENCOUNTER — Ambulatory Visit (INDEPENDENT_AMBULATORY_CARE_PROVIDER_SITE_OTHER): Payer: Medicare HMO | Admitting: *Deleted

## 2019-01-13 ENCOUNTER — Encounter: Payer: Medicare HMO | Admitting: Internal Medicine

## 2019-01-13 DIAGNOSIS — I442 Atrioventricular block, complete: Secondary | ICD-10-CM

## 2019-01-13 DIAGNOSIS — I4819 Other persistent atrial fibrillation: Secondary | ICD-10-CM

## 2019-01-13 NOTE — Progress Notes (Signed)
errir 
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
## 2019-01-14 LAB — CUP PACEART REMOTE DEVICE CHECK
Date Time Interrogation Session: 20200512095338
Implantable Lead Implant Date: 20100913
Implantable Lead Implant Date: 20150114
Implantable Lead Location: 753859
Implantable Lead Location: 753860
Implantable Pulse Generator Implant Date: 20150114
Pulse Gen Model: 2240
Pulse Gen Serial Number: 7586540

## 2019-01-15 ENCOUNTER — Other Ambulatory Visit: Payer: Self-pay

## 2019-01-16 ENCOUNTER — Ambulatory Visit: Payer: Medicare HMO | Admitting: Nurse Practitioner

## 2019-01-22 ENCOUNTER — Ambulatory Visit (INDEPENDENT_AMBULATORY_CARE_PROVIDER_SITE_OTHER): Payer: Medicare HMO | Admitting: Nurse Practitioner

## 2019-01-22 ENCOUNTER — Other Ambulatory Visit: Payer: Self-pay

## 2019-01-22 ENCOUNTER — Encounter: Payer: Self-pay | Admitting: Nurse Practitioner

## 2019-01-22 VITALS — BP 155/82 | HR 60 | Temp 97.0°F | Ht 66.0 in | Wt 218.0 lb

## 2019-01-22 DIAGNOSIS — Z952 Presence of prosthetic heart valve: Secondary | ICD-10-CM

## 2019-01-22 DIAGNOSIS — I480 Paroxysmal atrial fibrillation: Secondary | ICD-10-CM

## 2019-01-22 LAB — COAGUCHEK XS/INR WAIVED
INR: 4 — ABNORMAL HIGH (ref 0.9–1.1)
Prothrombin Time: 47.5 s

## 2019-01-22 NOTE — Progress Notes (Signed)
Subjective:   chief complaint: INR   Indication: atrial fibrillation Bleeding signs/symptoms: None Thromboembolic signs/symptoms: None  Missed Coumadin doses: None Medication changes: no Dietary changes: no Bacterial/viral infection: no Other concerns: no  The following portions of the patient's history were reviewed and updated as appropriate: allergies, current medications, past family history, past medical history, past social history, past surgical history and problem list.  Review of Systems Pertinent items noted in HPI and remainder of comprehensive ROS otherwise negative.   Objective:    INR Today: 4.0 Current dose: coumadin 5mg  daily excepy 2.5mg  t, th and sat.    Assessment:    Supratherapeutic INR for goal of 2-3   Plan:    1. New dose: hold today and tomorrow then coumadin 2.5mg  daily except 5mg  on m,W, and F   2. Next INR: 2 weeks    [name] Daphine Deutscher, FNP

## 2019-01-28 NOTE — Progress Notes (Signed)
Remote pacemaker transmission.   

## 2019-02-04 ENCOUNTER — Telehealth: Payer: Self-pay | Admitting: Nurse Practitioner

## 2019-02-04 NOTE — Telephone Encounter (Signed)
Called pt - new appt time given

## 2019-02-06 ENCOUNTER — Ambulatory Visit: Payer: Medicare HMO | Admitting: Nurse Practitioner

## 2019-02-10 ENCOUNTER — Ambulatory Visit: Payer: Medicare HMO | Admitting: Nurse Practitioner

## 2019-02-13 ENCOUNTER — Telehealth: Payer: Self-pay | Admitting: Nurse Practitioner

## 2019-02-13 NOTE — Telephone Encounter (Signed)
appt scheduled Pt notified 

## 2019-02-15 ENCOUNTER — Other Ambulatory Visit: Payer: Self-pay | Admitting: Nurse Practitioner

## 2019-02-19 ENCOUNTER — Other Ambulatory Visit: Payer: Self-pay | Admitting: Family Medicine

## 2019-02-19 ENCOUNTER — Telehealth: Payer: Self-pay | Admitting: Nurse Practitioner

## 2019-02-19 DIAGNOSIS — Z794 Long term (current) use of insulin: Secondary | ICD-10-CM

## 2019-02-19 DIAGNOSIS — E119 Type 2 diabetes mellitus without complications: Secondary | ICD-10-CM

## 2019-02-19 MED ORDER — INSULIN GLARGINE 100 UNIT/ML ~~LOC~~ SOLN: 40.0000 [IU] | Freq: Every day | SUBCUTANEOUS | 0 refills | Status: DC

## 2019-02-19 NOTE — Telephone Encounter (Signed)
I see this on pt's med list but we haven't rx'd it since 11/2016, ok to refill?

## 2019-02-19 NOTE — Telephone Encounter (Signed)
Ok to fill x1.  Needs OV w/ MM for f/u

## 2019-02-19 NOTE — Telephone Encounter (Signed)
Pt aware of MD feedback and voiced understanding and rx sent over to [facility] as requested.

## 2019-02-20 ENCOUNTER — Other Ambulatory Visit: Payer: Self-pay | Admitting: Family Medicine

## 2019-02-20 ENCOUNTER — Other Ambulatory Visit: Payer: Self-pay | Admitting: Nurse Practitioner

## 2019-02-20 DIAGNOSIS — E119 Type 2 diabetes mellitus without complications: Secondary | ICD-10-CM

## 2019-02-20 DIAGNOSIS — Z794 Long term (current) use of insulin: Secondary | ICD-10-CM

## 2019-02-21 ENCOUNTER — Other Ambulatory Visit: Payer: Self-pay

## 2019-02-24 ENCOUNTER — Other Ambulatory Visit: Payer: Self-pay

## 2019-02-24 ENCOUNTER — Encounter: Payer: Self-pay | Admitting: Nurse Practitioner

## 2019-02-24 ENCOUNTER — Ambulatory Visit (INDEPENDENT_AMBULATORY_CARE_PROVIDER_SITE_OTHER): Payer: Medicare HMO | Admitting: Nurse Practitioner

## 2019-02-24 VITALS — BP 147/84 | HR 61 | Temp 97.6°F | Ht 66.0 in | Wt 217.0 lb

## 2019-02-24 DIAGNOSIS — Z952 Presence of prosthetic heart valve: Secondary | ICD-10-CM

## 2019-02-24 DIAGNOSIS — M25541 Pain in joints of right hand: Secondary | ICD-10-CM

## 2019-02-24 DIAGNOSIS — I480 Paroxysmal atrial fibrillation: Secondary | ICD-10-CM

## 2019-02-24 LAB — COAGUCHEK XS/INR WAIVED
INR: 2.2 — ABNORMAL HIGH (ref 0.9–1.1)
Prothrombin Time: 26.4 s

## 2019-02-24 NOTE — Patient Instructions (Signed)
Hand Pain  Many things can cause hand pain. Some common causes are:  · An injury.  · Repeating the same movement with your hand over and over (overuse).  · Osteoporosis.  · Arthritis.  · Lumps in the tendons or joints of the hand and wrist (ganglion cysts).  · Infection.  Follow these instructions at home:  Pay attention to any changes in your symptoms. Take these actions to help with your discomfort:  · If directed, put ice on the affected area:  ? Put ice in a plastic bag.  ? Place a towel between your skin and the bag.  ? Leave the ice on for 15-20 minutes, 3?4 times a day for 2 days.  · Take over-the-counter and prescription medicines only as told by your health care provider.  · Minimize stress on your hands and wrists as much as possible.  · Take breaks from repetitive activity often.  · Do stretches as told by your health care provider.  · Do not do activities that make your pain worse.  Contact a health care provider if:  · Your pain does not get better after a few days of self-care.  · Your pain gets worse.  · Your pain affects your ability to do your daily activities.  Get help right away if:  · Your hand becomes warm, red, or swollen.  · Your hand is numb or tingling.  · Your hand is extremely swollen or deformed.  · Your hand or fingers turn white or blue.  · You cannot move your hand, wrist, or fingers.  This information is not intended to replace advice given to you by your health care provider. Make sure you discuss any questions you have with your health care provider.  Document Released: 09/17/2015 Document Revised: 01/27/2016 Document Reviewed: 09/16/2014  Elsevier Interactive Patient Education © 2019 [employer] Inc[employer]
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
## 2019-02-25 ENCOUNTER — Telehealth: Payer: Self-pay | Admitting: *Deleted

## 2019-02-25 DIAGNOSIS — E119 Type 2 diabetes mellitus without complications: Secondary | ICD-10-CM

## 2019-02-25 NOTE — Telephone Encounter (Signed)
Please change Basaglar  - they need:   directions And  Max dose daily on RX

## 2019-02-27 ENCOUNTER — Telehealth: Payer: Self-pay

## 2019-02-27 MED ORDER — BASAGLAR KWIKPEN 100 UNIT/ML ~~LOC~~ SOPN: 40.0000 [IU] | PEN_INJECTOR | Freq: Every day | SUBCUTANEOUS | 5 refills | Status: DC

## 2019-02-27 NOTE — Telephone Encounter (Signed)
FYI, patient declined appointment with [facility].

## 2019-02-28 ENCOUNTER — Other Ambulatory Visit: Payer: Self-pay | Admitting: Nurse Practitioner

## 2019-02-28 DIAGNOSIS — E785 Hyperlipidemia, unspecified: Secondary | ICD-10-CM

## 2019-03-07 ENCOUNTER — Other Ambulatory Visit: Payer: Self-pay | Admitting: Nurse Practitioner

## 2019-03-07 DIAGNOSIS — I359 Nonrheumatic aortic valve disorder, unspecified: Secondary | ICD-10-CM

## 2019-03-15 ENCOUNTER — Other Ambulatory Visit: Payer: Self-pay | Admitting: Cardiology

## 2019-03-17 ENCOUNTER — Encounter: Payer: Self-pay | Admitting: Nurse Practitioner

## 2019-03-17 ENCOUNTER — Other Ambulatory Visit: Payer: Self-pay

## 2019-03-17 ENCOUNTER — Ambulatory Visit (INDEPENDENT_AMBULATORY_CARE_PROVIDER_SITE_OTHER): Payer: Medicare HMO | Admitting: Nurse Practitioner

## 2019-03-17 DIAGNOSIS — I480 Paroxysmal atrial fibrillation: Secondary | ICD-10-CM

## 2019-03-17 DIAGNOSIS — Z952 Presence of prosthetic heart valve: Secondary | ICD-10-CM | POA: Diagnosis not present

## 2019-03-17 LAB — COAGUCHEK XS/INR WAIVED
INR: 3.6 — ABNORMAL HIGH (ref 0.9–1.1)
Prothrombin Time: 43.6 s

## 2019-03-17 NOTE — Progress Notes (Signed)
   Virtual Visit via telephone Note  I connected with Andre Malone on 03/17/19 at 11:00 by telephone and verified that I am speaking with the correct person using two identifiers. Andre Malone is currently located at home and no one is currently with her during visit. The provider, [name] Hassell Done, FNP is located in their office at time of visit.  I discussed the limitations, risks, security and privacy concerns of performing an evaluation and management service by telephone and the availability of in person appointments. I also discussed with the patient that there may be a patient responsible charge related to this service. The patient expressed understanding and agreed to proceed.   History and Present Illness:   Chief Complaint: Coagulation Disorder   HPI  Indication: atrial fibrillation Bleeding signs/symptoms: None Thromboembolic signs/symptoms: None  Missed Coumadin doses: None Medication changes: no Dietary changes: no Bacterial/viral infection: no Other concerns: no       Review of Systems  Respiratory: Negative for cough and shortness of breath.   Cardiovascular: Negative for chest pain and leg swelling.  All other systems reviewed and are negative.    Observations/Objective: Alert and oriented No distress INR 3.3  Assessment and Plan: No change to coumadin dosage- see INR documantation  Follow Up Instructions: 1 months    I discussed the assessment and treatment plan with the patient. The patient was provided an opportunity to ask questions and all were answered. The patient agreed with the plan and demonstrated an understanding of the instructions.   The patient was advised to call back or seek an in-person evaluation if the symptoms worsen or if the condition fails to improve as anticipated.  The above assessment and management plan was discussed with the patient. The patient verbalized understanding of and has agreed to the management  plan. Patient is aware to call the clinic if symptoms persist or worsen. Patient is aware when to return to the clinic for a follow-up visit. Patient educated on when it is appropriate to go to the emergency department.   Time call ended:  11:09  I provided 9 minutes of non-face-to-face time during this encounter.    [name] Hassell Done, FNP

## 2019-03-17 NOTE — Addendum Note (Signed)
Addended by: Rolena Infante on: 03/17/2019 11:42 AM   Modules accepted: Orders

## 2019-03-18 ENCOUNTER — Telehealth: Payer: Self-pay | Admitting: Nurse Practitioner

## 2019-03-18 NOTE — Telephone Encounter (Signed)
Patient had spoke to provider.

## 2019-03-24 DIAGNOSIS — G2581 Restless legs syndrome: Secondary | ICD-10-CM | POA: Diagnosis not present

## 2019-03-24 DIAGNOSIS — M25512 Pain in left shoulder: Secondary | ICD-10-CM | POA: Diagnosis not present

## 2019-03-24 DIAGNOSIS — M5412 Radiculopathy, cervical region: Secondary | ICD-10-CM | POA: Diagnosis not present

## 2019-03-24 DIAGNOSIS — M542 Cervicalgia: Secondary | ICD-10-CM | POA: Diagnosis not present

## 2019-03-24 DIAGNOSIS — E0842 Diabetes mellitus due to underlying condition with diabetic polyneuropathy: Secondary | ICD-10-CM | POA: Diagnosis not present

## 2019-03-24 DIAGNOSIS — Z79891 Long term (current) use of opiate analgesic: Secondary | ICD-10-CM | POA: Diagnosis not present

## 2019-04-08 ENCOUNTER — Ambulatory Visit: Payer: Medicare HMO | Admitting: Family

## 2019-04-08 ENCOUNTER — Other Ambulatory Visit: Payer: Self-pay

## 2019-04-14 ENCOUNTER — Other Ambulatory Visit: Payer: Self-pay

## 2019-04-14 ENCOUNTER — Ambulatory Visit (INDEPENDENT_AMBULATORY_CARE_PROVIDER_SITE_OTHER): Payer: Medicare HMO | Admitting: *Deleted

## 2019-04-14 DIAGNOSIS — I442 Atrioventricular block, complete: Secondary | ICD-10-CM

## 2019-04-14 LAB — CUP PACEART REMOTE DEVICE CHECK
Battery Remaining Longevity: 62 mo
Battery Remaining Percentage: 80 %
Battery Voltage: 2.93 V
Brady Statistic AP VP Percent: 87 %
Brady Statistic AP VS Percent: 1 %
Brady Statistic AS VP Percent: 13 %
Brady Statistic AS VS Percent: 1 %
Brady Statistic RA Percent Paced: 88 %
Brady Statistic RV Percent Paced: 99 %
Date Time Interrogation Session: 20200810060015
Implantable Lead Implant Date: 20100913
Implantable Lead Implant Date: 20150114
Implantable Lead Location: 753859
Implantable Lead Location: 753860
Implantable Pulse Generator Implant Date: 20150114
Lead Channel Impedance Value: 290 Ohm
Lead Channel Impedance Value: 480 Ohm
Lead Channel Pacing Threshold Amplitude: 0.75 V
Lead Channel Pacing Threshold Amplitude: 1 V
Lead Channel Pacing Threshold Pulse Width: 0.5 ms
Lead Channel Pacing Threshold Pulse Width: 0.8 ms
Lead Channel Sensing Intrinsic Amplitude: 3 mV
Lead Channel Sensing Intrinsic Amplitude: 6.3 mV
Lead Channel Setting Pacing Amplitude: 2.5 V
Lead Channel Setting Pacing Amplitude: 2.5 V
Lead Channel Setting Pacing Pulse Width: 0.5 ms
Lead Channel Setting Sensing Sensitivity: 4 mV
Pulse Gen Model: 2240
Pulse Gen Serial Number: 7586540

## 2019-04-15 ENCOUNTER — Ambulatory Visit: Payer: Self-pay | Admitting: Nurse Practitioner

## 2019-04-22 ENCOUNTER — Encounter: Payer: Self-pay | Admitting: Cardiology

## 2019-04-22 NOTE — Progress Notes (Signed)
Remote pacemaker transmission.   

## 2019-04-24 ENCOUNTER — Other Ambulatory Visit: Payer: Self-pay

## 2019-04-25 ENCOUNTER — Ambulatory Visit (INDEPENDENT_AMBULATORY_CARE_PROVIDER_SITE_OTHER): Payer: Medicare HMO | Admitting: Nurse Practitioner

## 2019-04-25 ENCOUNTER — Encounter: Payer: Self-pay | Admitting: Nurse Practitioner

## 2019-04-25 VITALS — BP 142/71 | HR 60 | Temp 97.8°F | Ht 66.0 in | Wt 211.4 lb

## 2019-04-25 DIAGNOSIS — I480 Paroxysmal atrial fibrillation: Secondary | ICD-10-CM

## 2019-04-25 DIAGNOSIS — R69 Illness, unspecified: Secondary | ICD-10-CM | POA: Diagnosis not present

## 2019-04-25 DIAGNOSIS — F321 Major depressive disorder, single episode, moderate: Secondary | ICD-10-CM | POA: Diagnosis not present

## 2019-04-25 DIAGNOSIS — Z952 Presence of prosthetic heart valve: Secondary | ICD-10-CM | POA: Diagnosis not present

## 2019-04-25 LAB — COAGUCHEK XS/INR WAIVED
INR: 3.1 — ABNORMAL HIGH (ref 0.9–1.1)
Prothrombin Time: 37.4 s

## 2019-04-25 MED ORDER — ESCITALOPRAM OXALATE 10 MG PO TABS: 10.0000 mg | ORAL_TABLET | Freq: Every day | ORAL | 5 refills | Status: DC

## 2019-04-25 MED ORDER — ALPRAZOLAM 0.25 MG PO TABS: 0.2500 mg | ORAL_TABLET | Freq: Two times a day (BID) | ORAL | 0 refills | Status: DC | PRN

## 2019-04-25 NOTE — Progress Notes (Signed)
Subjective:   chief complaint: INR recheck   Indication: atrial fibrillation Bleeding signs/symptoms: None Thromboembolic signs/symptoms: None  Missed Coumadin doses: None Medication changes: no Dietary changes: no Bacterial/viral infection: no Other concerns: no   8 wife had appointment in [place] today regarding her cancer and did not et a good report. He is very depressed today-  Depression screen [facility] 2/9 04/25/2019 02/24/2019 01/22/2019 12/03/2018 08/02/2018  Decreased Interest 3 0 0 0 0  Down, Depressed, Hopeless 3 0 0 0 0  PHQ - 2 Score 6 0 0 0 0  Altered sleeping 3 - - - -  Tired, decreased energy 3 - - - -  Change in appetite 3 - - - -  Feeling bad or failure about yourself  0 - - - -  Trouble concentrating 3 - - - -  Moving slowly or fidgety/restless 0 - - - -  Suicidal thoughts 0 - - - -  PHQ-9 Score 18 - - - -  Difficult doing work/chores Not difficult at all - - - -  Some recent data might be hidden    The following portions of the patient's history were reviewed and updated as appropriate: allergies, current medications, past family history, past medical history, past social history, past surgical history and problem list.  Review of Systems Pertinent items noted in HPI and remainder of comprehensive ROS otherwise negative.   Objective:    INR Today: 3.1 Current dose: 3.1    Assessment:    Supratherapeutic INR for goal of 2-3   depression  Plan:    1. New dose: no change   2. Next INR: 1 month    Depression Meds ordered this encounter  Medications  . ALPRAZolam (XANAX) 0.25 MG tablet    Sig: Take 1 tablet (0.25 mg total) by mouth 2 (two) times daily as needed for anxiety.    Dispense:  20 tablet    Refill:  0    Order Specific Question:   Supervising Provider    Answer:   Caryl Pina A [id number]  . escitalopram (LEXAPRO) 10 MG tablet    Sig: Take 1 tablet (10 mg total) by mouth daily.    Dispense:  30 tablet    Refill:  5    Order  Specific Question:   Supervising Provider    Answer:   Caryl Pina A [id number]   Stress management Side effects of meds discussed   [name] Hassell Done, FNP

## 2019-04-25 NOTE — Patient Instructions (Signed)

## 2019-04-26 ENCOUNTER — Other Ambulatory Visit: Payer: Self-pay | Admitting: Nurse Practitioner

## 2019-04-29 ENCOUNTER — Telehealth: Payer: Self-pay | Admitting: Nurse Practitioner

## 2019-04-29 MED ORDER — CITALOPRAM HYDROBROMIDE 20 MG PO TABS: 20.0000 mg | ORAL_TABLET | Freq: Every day | ORAL | 5 refills | Status: DC

## 2019-04-29 NOTE — Telephone Encounter (Signed)
can increase INR but we keep a check on that. It is ok to take.

## 2019-04-29 NOTE — Telephone Encounter (Signed)
Pharmacy told him that the lexapro will interact with another one of his medications. He doesn't want to take. Can he take xanax 0.5 instead? Please advise

## 2019-04-29 NOTE — Addendum Note (Signed)
Addended by: Rolena Infante on: 04/29/2019 04:55 PM   Modules accepted: Orders

## 2019-04-29 NOTE — Telephone Encounter (Signed)
Advised patient to take medications. Pharmacy notified and they said that the copay was $140. Changed to celexa 20mg  daily and patient notified

## 2019-05-03 ENCOUNTER — Other Ambulatory Visit: Payer: Self-pay | Admitting: Nurse Practitioner

## 2019-05-03 DIAGNOSIS — E785 Hyperlipidemia, unspecified: Secondary | ICD-10-CM

## 2019-05-25 ENCOUNTER — Other Ambulatory Visit: Payer: Self-pay | Admitting: Nurse Practitioner

## 2019-06-02 ENCOUNTER — Other Ambulatory Visit: Payer: Self-pay

## 2019-06-02 ENCOUNTER — Other Ambulatory Visit: Payer: Medicare HMO

## 2019-06-02 ENCOUNTER — Ambulatory Visit: Payer: Self-pay | Admitting: Nurse Practitioner

## 2019-06-03 ENCOUNTER — Other Ambulatory Visit: Payer: Self-pay

## 2019-06-04 ENCOUNTER — Encounter: Payer: Self-pay | Admitting: Nurse Practitioner

## 2019-06-04 ENCOUNTER — Ambulatory Visit (INDEPENDENT_AMBULATORY_CARE_PROVIDER_SITE_OTHER): Payer: Medicare HMO | Admitting: Nurse Practitioner

## 2019-06-04 VITALS — BP 142/74 | HR 61 | Temp 97.5°F | Resp 16 | Ht 66.0 in | Wt 215.0 lb

## 2019-06-04 DIAGNOSIS — E039 Hypothyroidism, unspecified: Secondary | ICD-10-CM

## 2019-06-04 DIAGNOSIS — I480 Paroxysmal atrial fibrillation: Secondary | ICD-10-CM

## 2019-06-04 DIAGNOSIS — I10 Essential (primary) hypertension: Secondary | ICD-10-CM

## 2019-06-04 DIAGNOSIS — L539 Erythematous condition, unspecified: Secondary | ICD-10-CM

## 2019-06-04 DIAGNOSIS — R69 Illness, unspecified: Secondary | ICD-10-CM | POA: Diagnosis not present

## 2019-06-04 DIAGNOSIS — E1159 Type 2 diabetes mellitus with other circulatory complications: Secondary | ICD-10-CM | POA: Diagnosis not present

## 2019-06-04 DIAGNOSIS — I442 Atrioventricular block, complete: Secondary | ICD-10-CM | POA: Diagnosis not present

## 2019-06-04 DIAGNOSIS — G2581 Restless legs syndrome: Secondary | ICD-10-CM

## 2019-06-04 DIAGNOSIS — E1140 Type 2 diabetes mellitus with diabetic neuropathy, unspecified: Secondary | ICD-10-CM | POA: Diagnosis not present

## 2019-06-04 DIAGNOSIS — I152 Hypertension secondary to endocrine disorders: Secondary | ICD-10-CM

## 2019-06-04 DIAGNOSIS — F3342 Major depressive disorder, recurrent, in full remission: Secondary | ICD-10-CM

## 2019-06-04 DIAGNOSIS — Z794 Long term (current) use of insulin: Secondary | ICD-10-CM | POA: Diagnosis not present

## 2019-06-04 DIAGNOSIS — R0602 Shortness of breath: Secondary | ICD-10-CM

## 2019-06-04 DIAGNOSIS — E559 Vitamin D deficiency, unspecified: Secondary | ICD-10-CM | POA: Diagnosis not present

## 2019-06-04 DIAGNOSIS — E663 Overweight: Secondary | ICD-10-CM

## 2019-06-04 DIAGNOSIS — I5023 Acute on chronic systolic (congestive) heart failure: Secondary | ICD-10-CM

## 2019-06-04 DIAGNOSIS — Z23 Encounter for immunization: Secondary | ICD-10-CM

## 2019-06-04 DIAGNOSIS — I359 Nonrheumatic aortic valve disorder, unspecified: Secondary | ICD-10-CM | POA: Diagnosis not present

## 2019-06-04 DIAGNOSIS — F5101 Primary insomnia: Secondary | ICD-10-CM

## 2019-06-04 DIAGNOSIS — E785 Hyperlipidemia, unspecified: Secondary | ICD-10-CM

## 2019-06-04 DIAGNOSIS — E119 Type 2 diabetes mellitus without complications: Secondary | ICD-10-CM

## 2019-06-04 DIAGNOSIS — Z952 Presence of prosthetic heart valve: Secondary | ICD-10-CM

## 2019-06-04 LAB — BAYER DCA HB A1C WAIVED: HB A1C (BAYER DCA - WAIVED): 6.4 % (ref <7.0)

## 2019-06-04 LAB — COAGUCHEK XS/INR WAIVED
INR: 2 — ABNORMAL HIGH (ref 0.9–1.1)
Prothrombin Time: 23.6 s

## 2019-06-04 MED ORDER — PRAVASTATIN SODIUM 40 MG PO TABS: 40.0000 mg | ORAL_TABLET | Freq: Every day | ORAL | 1 refills | Status: DC

## 2019-06-04 MED ORDER — TRAZODONE HCL 100 MG PO TABS: ORAL_TABLET | ORAL | 0 refills | Status: DC

## 2019-06-04 MED ORDER — FUROSEMIDE 40 MG PO TABS: 40.0000 mg | ORAL_TABLET | Freq: Every day | ORAL | 1 refills | Status: DC

## 2019-06-04 MED ORDER — AMIODARONE HCL 200 MG PO TABS: 200.0000 mg | ORAL_TABLET | Freq: Two times a day (BID) | ORAL | 0 refills | Status: DC

## 2019-06-04 MED ORDER — METFORMIN HCL 1000 MG PO TABS: ORAL_TABLET | ORAL | 1 refills | Status: DC

## 2019-06-04 MED ORDER — ALPRAZOLAM 0.25 MG PO TABS: 0.2500 mg | ORAL_TABLET | Freq: Two times a day (BID) | ORAL | 0 refills | Status: DC | PRN

## 2019-06-04 MED ORDER — WARFARIN SODIUM 5 MG PO TABS: ORAL_TABLET | ORAL | 1 refills | Status: DC

## 2019-06-04 MED ORDER — AMLODIPINE BESYLATE 5 MG PO TABS: 2.5000 mg | ORAL_TABLET | Freq: Every day | ORAL | 3 refills | Status: DC

## 2019-06-04 MED ORDER — LEVOTHYROXINE SODIUM 100 MCG PO TABS: 100.0000 ug | ORAL_TABLET | Freq: Every day | ORAL | 0 refills | Status: DC

## 2019-06-04 MED ORDER — BASAGLAR KWIKPEN 100 UNIT/ML ~~LOC~~ SOPN: 40.0000 [IU] | PEN_INJECTOR | Freq: Every day | SUBCUTANEOUS | 5 refills | Status: DC

## 2019-06-04 MED ORDER — PRAMIPEXOLE DIHYDROCHLORIDE 1.5 MG PO TABS: 1.5000 mg | ORAL_TABLET | Freq: Every day | ORAL | 0 refills | Status: DC

## 2019-06-04 NOTE — Progress Notes (Signed)
Patient ID: Andre Malone, male   DOB: [birth year], 68 y.o.   MRN: [id number]  Subjective:    Patient ID: Andre Malone, male    DOB: [birth year], 68 y.o.   MRN: [id number]   Chief Complaint: Medical Management of Chronic Issues    HPI:  1. Hypertension associated with diabetes ([place]) No c/o chest pain,sob or headache. Does not check blood pressure at home. BP Readings from Last 3 Encounters:  04/25/19 (!) 142/71  02/24/19 (!) 147/84  01/22/19 (!) 155/82     2. Acute on chronic systolic heart failure ([place]) Has not seen cardiology in over a year. But says he is feeling ok. Has pacemaker checked every few months though  3. Atrioventricular block, complete ([place]) Has pacemaker in. Has checked every few months as stated above.  4. Hypothyroidism, unspecified type No problems that he is aware of. Dose was increased at last check. He has not been checked since. Lab Results  Component Value Date   TSH 6.240 (H) 12/18/2017     5. Recurrent major depressive disorder, in full remission [facility]) He is having a hard time right now because his wife is so sick. Is on xanax BID for his anxiety.  6. RLS (restless legs syndrome) He is on mirapex nightly and it helps to keep his legs from being jerky at night.  7. Vitamin D deficiency Does not always remember to take his vitamin d supplement  8. Diabetes Does not check blood sugars very often. Does not watch diet at all. Lab Results  Component Value Date   HGBA1C 6.2 05/14/2018    9. Overweight No recent weight changes Wt Readings from Last 3 Encounters:  04/25/19 211 lb 6.4 oz (95.9 kg)  02/24/19 217 lb (98.4 kg)  01/22/19 218 lb (98.9 kg)   BMI Readings from Last 3 Encounters:  04/25/19 34.12 kg/m  02/24/19 35.02 kg/m  01/22/19 35.19 kg/m     Indication: pacemaker Bleeding signs/symptoms: None Thromboembolic signs/symptoms: None  Missed Coumadin doses: None Medication changes: no Dietary changes:  no Bacterial/viral infection: no Other concerns: no       Outpatient Encounter Medications as of 06/04/2019  Medication Sig  . acetaminophen (TYLENOL) 325 MG tablet Take 2 tablets (650 mg total) by mouth every 4 (four) hours as needed for headache or mild pain.  Marland Kitchen ALPRAZolam (XANAX) 0.25 MG tablet Take 1 tablet (0.25 mg total) by mouth 2 (two) times daily as needed for anxiety.  Marland Kitchen amiodarone (PACERONE) 200 MG tablet Take 1 tablet (200 mg total) by mouth 2 (two) times daily. Please schedule appt for future refills. 1st attempt  . amLODipine (NORVASC) 5 MG tablet Take 0.5 tablets (2.5 mg total) by mouth daily.  Marland Kitchen aspirin EC 81 MG EC tablet Take 1 tablet (81 mg total) by mouth daily.  . citalopram (CELEXA) 20 MG tablet TAKE 1 TABLET BY MOUTH EVERY DAY  . clotrimazole (LOTRIMIN) 1 % cream Apply 1 application topically 2 (two) times daily.  . cyclobenzaprine (FLEXERIL) 10 MG tablet Take 1 tablet (10 mg total) by mouth 3 (three) times daily as needed for muscle spasms.  . furosemide (LASIX) 40 MG tablet TAKE 1 TABLET BY MOUTH EVERY DAY  . Insulin Glargine (BASAGLAR KWIKPEN) 100 UNIT/ML SOPN Inject 0.4 mLs (40 Units total) into the skin daily.  Marland Kitchen levothyroxine (SYNTHROID) 100 MCG tablet Take 1 tablet (100 mcg total) by mouth daily. (Needs to be seen before next refill  . LYRICA 75 MG capsule Take 75  mg by mouth daily as needed (burning sensation in feet).   . metFORMIN (GLUCOPHAGE) 1000 MG tablet TAKE 1 TABLET (1,000 MG TOTAL) BY MOUTH 2 (TWO) TIMES DAILY WITH A MEAL.  . Multiple Vitamin (MULTIVITAMIN WITH MINERALS) TABS tablet Take 1 tablet by mouth daily.  Marland Kitchen oxyCODONE (ROXICODONE) 15 MG immediate release tablet Take 15 mg by mouth every 6 (six) hours as needed for pain.   Marland Kitchen Potassium Chloride ER 20 MEQ TBCR Take 20 mEq by mouth daily.  . pramipexole (MIRAPEX) 1.5 MG tablet TAKE 1 TABLET (1.5 MG TOTAL) BY MOUTH AT BEDTIME.  . pravastatin (PRAVACHOL) 40 MG tablet TAKE 1 TABLET (40 MG TOTAL) BY  MOUTH EVERY EVENING. FOR CHOLESTEROL  . sertraline (ZOLOFT) 100 MG tablet Take 1 tablet (100 mg total) by mouth daily.  . tamsulosin (FLOMAX) 0.4 MG CAPS capsule Take 1 capsule (0.4 mg total) by mouth daily as needed. Reported on 01/05/2016  . traMADol (ULTRAM) 50 MG tablet Take 50 mg by mouth as needed for moderate pain.   . traZODone (DESYREL) 100 MG tablet TAKE ONE TABLET BY MOUTH AT BEDTIME  . warfarin (COUMADIN) 5 MG tablet TAKE 1/2 TABLET ON MONDAY AND THURSDAY AND THEN TAKE 1 TABLET ALL THE OTHER DAYS     Past Surgical History:  Procedure Laterality Date  . AORTIC ROOT REPLACEMENT    . AORTIC VALVE REPLACEMENT    . CARDIAC CATHETERIZATION    . CARDIAC VALVE REPLACEMENT    . LEAD REVISION N/A 09/17/2013   Procedure: LEAD REVISION;  Surgeon: Deboraha Sprang, MD;  Location[facility] C[facility] LAB;  Service: Cardiovascular;  Laterality: N/A;  . LEFT HEART CATHETERIZATION WITH CORONARY ANGIOGRAM N/A 05/30/2013   Procedure: LEFT HEART CATHETERIZATION WITH CORONARY ANGIOGRAM;  Surgeon: Peter M Martinique, MD;  Location: [facility] CATH LAB;  Service: Cardiovascular;  Laterality: N/A;  . PACEMAKER INSERTION  05/17/2009; 09/17/2013   STJ dual chamber pacemaker implanted for CHB; RV lead revision and generator change 09/17/2013 by Dr Caryl Comes for ventricular lead malfunction  . Right elbow surgery    . Subxiphoid window    . TONSILLECTOMY    . TOTAL HIP ARTHROPLASTY  02/27/2012   Procedure: TOTAL HIP ARTHROPLASTY - left  Surgeon: Garald Balding, MD;  Location: [facility];  Service: Orthopedics;  Laterality: Left;    Family History  Problem Relation Age of Onset  . Heart disease Father        Deceased  . Alcohol abuse Father   . Other Mother        Deceased at young age  . Stroke Mother        after delivery  . Heart attack Brother   . Other Brother        MVA  . Heart disease Paternal Grandfather   . Healthy Son   . Healthy Daughter     New complaints: Has passed seveeral kidney stone sin the last few weeks.  They are uric acid crystals and he has been on allopurinol in the past which helped Having problems with skin on tip of nose. Social history: Wife is on hospice for uterine cancer with metastais, he says she only has afew days to live.  Controlled substance contract: 06/04/19     Review of Systems  Constitutional: Negative for activity change and appetite change.  HENT: Negative.   Eyes: Negative for pain.  Respiratory: Negative for shortness of breath.   Cardiovascular: Negative for chest pain, palpitations and leg swelling.  Gastrointestinal: Negative  for abdominal pain.  Endocrine: Negative for polydipsia.  Genitourinary: Negative.   Skin: Negative for rash.  Neurological: Negative for dizziness and weakness.  Hematological: Does not bruise/bleed easily.  Psychiatric/Behavioral: Positive for dysphoric mood. Negative for sleep disturbance. The patient is nervous/anxious.   All other systems reviewed and are negative.      Objective:   Physical Exam Vitals signs and nursing note reviewed.  Constitutional:      Appearance: Normal appearance. He is well-developed.  HENT:     Head: Normocephalic.     Nose: Nose normal.  Eyes:     Pupils: Pupils are equal, round, and reactive to light.  Neck:     Musculoskeletal: Normal range of motion and neck supple.     Thyroid: No thyroid mass or thyromegaly.     Vascular: No carotid bruit or JVD.     Trachea: Phonation normal.  Cardiovascular:     Rate and Rhythm: Normal rate and regular rhythm. Audible heart valve click  Pulmonary:     Effort: Pulmonary effort is normal. No respiratory distress.     Breath sounds: Normal breath sounds.  Abdominal:     General: Bowel sounds are normal.     Palpations: Abdomen is soft.     Tenderness: There is no abdominal tenderness.  Musculoskeletal: Normal range of motion.  Lymphadenopathy:     Cervical: No cervical adenopathy.  Skin:    General: Skin is warm and dry.  Neurological:      Mental Status: He is alert and oriented to person, place, and time.  Psychiatric:        Behavior: Behavior normal.        Thought Content: Thought content normal.        Judgment: Judgment normal.     BP (!) 142/74   Pulse 61   Temp (!) 97.5 F (36.4 C) (Temporal)   Resp 16   Ht 5' 6" (1.676 m)   Wt 215 lb (97.5 kg)   SpO2 93%   BMI 34.70 kg/m        Assessment & Plan:  JOSIAN LANESE comes in today with chief complaint of Medical Management of Chronic Issues   Diagnosis and orders addressed:  1. Hypertension associated with diabetes ([place]) Low sodium diet - CMP14+EGFR  2. Acute on chronic systolic heart failure ([place]) Needs to see cardiology in the next few months - CoaguChek XS/INR Waived  3. Atrioventricular block, complete ([facility]) Continue to check pacemaker evry 3 months  4. Hypothyroidism, unspecified type labs pending  5. Recurrent major depressive disorder, in full remission ([place]) Dong ok- no med changes  6. RLS (restless legs syndrome) Keep legs warm at night  7. Vitamin D deficiency Continue vitamin d supplement  8. Overweight Discussed diet and exercise for person with BMI >25 Will recheck weight in 3-6 months  9. Type 2 diabetes mellitus with diabetic neuropathy, with long-term current use of insulin ([facility]) Watch carbs in diet - Bayer DCA Hb A1c Waived - Lipid panel  10. Aortic valve disorder  11. Hyperlipidemia with target LDL less than 100 Low fat diet  12. Primary insomnia Bedtime routine   13. Erythema of nasal skin Do not pick or scratch - Ambulatory referral to Dermatology   Labs pending Health Maintenance reviewed Diet and exercise encouraged  Follow up plan: 3 months   [name] Hassell Done, FNP

## 2019-06-05 LAB — CMP14+EGFR
ALT: 17 IU/L (ref 0–44)
AST: 27 IU/L (ref 0–40)
Albumin/Globulin Ratio: 1.5 (ref 1.2–2.2)
Albumin: 4.3 g/dL (ref 3.8–4.8)
Alkaline Phosphatase: 74 IU/L (ref 39–117)
BUN/Creatinine Ratio: 18 (ref 10–24)
BUN: 22 mg/dL (ref 8–27)
Bilirubin Total: 0.5 mg/dL (ref 0.0–1.2)
CO2: 24 mmol/L (ref 20–29)
Calcium: 9.4 mg/dL (ref 8.6–10.2)
Chloride: 104 mmol/L (ref 96–106)
Creatinine, Ser: 1.23 mg/dL (ref 0.76–1.27)
GFR calc Af Amer: 70 mL/min/{1.73_m2} (ref >59)
GFR calc non Af Amer: 60 mL/min/{1.73_m2} (ref >59)
Globulin, Total: 2.8 g/dL (ref 1.5–4.5)
Glucose: 137 mg/dL — ABNORMAL HIGH (ref 65–99)
Potassium: 4.2 mmol/L (ref 3.5–5.2)
Sodium: 141 mmol/L (ref 134–144)
Total Protein: 7.1 g/dL (ref 6.0–8.5)

## 2019-06-05 LAB — LIPID PANEL
Chol/HDL Ratio: 4.4 ratio (ref 0.0–5.0)
Cholesterol, Total: 204 mg/dL — ABNORMAL HIGH (ref 100–199)
HDL: 46 mg/dL (ref >39)
LDL Chol Calc (NIH): 138 mg/dL — ABNORMAL HIGH (ref 0–99)
Triglycerides: 113 mg/dL (ref 0–149)
VLDL Cholesterol Cal: 20 mg/dL (ref 5–40)

## 2019-06-18 DIAGNOSIS — G2581 Restless legs syndrome: Secondary | ICD-10-CM | POA: Diagnosis not present

## 2019-06-18 DIAGNOSIS — M25512 Pain in left shoulder: Secondary | ICD-10-CM | POA: Diagnosis not present

## 2019-06-18 DIAGNOSIS — M5412 Radiculopathy, cervical region: Secondary | ICD-10-CM | POA: Diagnosis not present

## 2019-06-18 DIAGNOSIS — E0842 Diabetes mellitus due to underlying condition with diabetic polyneuropathy: Secondary | ICD-10-CM | POA: Diagnosis not present

## 2019-07-03 ENCOUNTER — Telehealth: Payer: Self-pay | Admitting: Nurse Practitioner

## 2019-07-03 MED ORDER — ALPRAZOLAM 0.25 MG PO TABS: 0.2500 mg | ORAL_TABLET | Freq: Two times a day (BID) | ORAL | 0 refills | Status: DC | PRN

## 2019-07-03 NOTE — Telephone Encounter (Signed)
xanax sent to pharmacy- wife died

## 2019-07-07 ENCOUNTER — Ambulatory Visit: Payer: Self-pay | Admitting: Nurse Practitioner

## 2019-07-14 ENCOUNTER — Ambulatory Visit (INDEPENDENT_AMBULATORY_CARE_PROVIDER_SITE_OTHER): Payer: Medicare HMO | Admitting: *Deleted

## 2019-07-14 DIAGNOSIS — I442 Atrioventricular block, complete: Secondary | ICD-10-CM

## 2019-07-14 DIAGNOSIS — I5023 Acute on chronic systolic (congestive) heart failure: Secondary | ICD-10-CM

## 2019-07-15 LAB — CUP PACEART REMOTE DEVICE CHECK
Battery Remaining Longevity: 55 mo
Battery Remaining Percentage: 72 %
Battery Voltage: 2.92 V
Brady Statistic AP VP Percent: 88 %
Brady Statistic AP VS Percent: 1 %
Brady Statistic AS VP Percent: 11 %
Brady Statistic AS VS Percent: 1 %
Brady Statistic RA Percent Paced: 90 %
Brady Statistic RV Percent Paced: 99 %
Date Time Interrogation Session: 20201109070017
Implantable Lead Implant Date: 20100913
Implantable Lead Implant Date: 20150114
Implantable Lead Location: 753859
Implantable Lead Location: 753860
Implantable Pulse Generator Implant Date: 20150114
Lead Channel Impedance Value: 260 Ohm
Lead Channel Impedance Value: 480 Ohm
Lead Channel Pacing Threshold Amplitude: 0.75 V
Lead Channel Pacing Threshold Amplitude: 1 V
Lead Channel Pacing Threshold Pulse Width: 0.5 ms
Lead Channel Pacing Threshold Pulse Width: 0.8 ms
Lead Channel Sensing Intrinsic Amplitude: 2.7 mV
Lead Channel Sensing Intrinsic Amplitude: 5.9 mV
Lead Channel Setting Pacing Amplitude: 2.5 V
Lead Channel Setting Pacing Amplitude: 2.5 V
Lead Channel Setting Pacing Pulse Width: 0.5 ms
Lead Channel Setting Sensing Sensitivity: 4 mV
Pulse Gen Model: 2240
Pulse Gen Serial Number: 7586540

## 2019-07-19 ENCOUNTER — Other Ambulatory Visit: Payer: Self-pay | Admitting: Nurse Practitioner

## 2019-07-23 ENCOUNTER — Other Ambulatory Visit: Payer: Self-pay

## 2019-07-24 ENCOUNTER — Encounter: Payer: Self-pay | Admitting: Nurse Practitioner

## 2019-07-24 ENCOUNTER — Ambulatory Visit (INDEPENDENT_AMBULATORY_CARE_PROVIDER_SITE_OTHER): Payer: Medicare HMO | Admitting: Nurse Practitioner

## 2019-07-24 VITALS — BP 145/79 | HR 70 | Temp 98.6°F | Resp 20 | Ht 66.0 in | Wt 217.0 lb

## 2019-07-24 DIAGNOSIS — Z23 Encounter for immunization: Secondary | ICD-10-CM

## 2019-07-24 DIAGNOSIS — Z952 Presence of prosthetic heart valve: Secondary | ICD-10-CM | POA: Diagnosis not present

## 2019-07-24 DIAGNOSIS — I480 Paroxysmal atrial fibrillation: Secondary | ICD-10-CM | POA: Diagnosis not present

## 2019-07-24 LAB — COAGUCHEK XS/INR WAIVED
INR: 2.9 — ABNORMAL HIGH (ref 0.9–1.1)
Prothrombin Time: 34.4 s

## 2019-07-24 NOTE — Progress Notes (Signed)
Subjective:   Chief Complaint: INR recheck   Indication: pacemaker Bleeding signs/symptoms: None Thromboembolic signs/symptoms: None  Missed Coumadin doses: None Medication changes: no Dietary changes: no Bacterial/viral infection: no Other concerns: no  The following portions of the patient's history were reviewed and updated as appropriate: allergies, current medications, past family history, past medical history, past social history, past surgical history and problem list.  Review of Systems Pertinent items noted in HPI and remainder of comprehensive ROS otherwise negative.   Objective:    INR Today: 2.9 Current dose: coumadin 5mg  daily except 2.5mg  on T,TH and sat    Assessment:    Therapeutic INR for goal of 2-3   Plan:    1. New dose: no change   2. Next INR: 1 month    [name] Hassell Done, FNP

## 2019-08-13 NOTE — Progress Notes (Signed)
Remote pacemaker transmission.   

## 2019-08-19 ENCOUNTER — Other Ambulatory Visit: Payer: Self-pay | Admitting: Nurse Practitioner

## 2019-08-19 MED ORDER — LEVOTHYROXINE SODIUM 100 MCG PO TABS: 100.0000 ug | ORAL_TABLET | Freq: Every day | ORAL | 0 refills | Status: DC

## 2019-08-19 NOTE — Telephone Encounter (Signed)
MMM NTBS 30 days given 07/21/19

## 2019-08-19 NOTE — Telephone Encounter (Signed)
Refill sent - pt has appt in 2 weeks

## 2019-08-19 NOTE — Addendum Note (Signed)
Addended by: Zannie Cove on: 08/19/2019 09:23 AM   Modules accepted: Orders

## 2019-08-20 ENCOUNTER — Other Ambulatory Visit: Payer: Self-pay

## 2019-08-21 ENCOUNTER — Ambulatory Visit: Payer: Self-pay | Admitting: Nurse Practitioner

## 2019-09-01 ENCOUNTER — Other Ambulatory Visit: Payer: Self-pay | Admitting: Nurse Practitioner

## 2019-09-04 ENCOUNTER — Other Ambulatory Visit: Payer: Self-pay | Admitting: Nurse Practitioner

## 2019-09-08 ENCOUNTER — Ambulatory Visit: Payer: Self-pay | Admitting: Nurse Practitioner

## 2019-09-08 NOTE — Telephone Encounter (Signed)
Last filled on 07/03/19 for #20

## 2019-09-11 ENCOUNTER — Other Ambulatory Visit: Payer: Self-pay

## 2019-09-11 ENCOUNTER — Ambulatory Visit: Payer: Medicare HMO | Attending: Internal Medicine

## 2019-09-11 DIAGNOSIS — Z20822 Contact with and (suspected) exposure to covid-19: Secondary | ICD-10-CM

## 2019-09-12 ENCOUNTER — Ambulatory Visit: Payer: Medicare HMO | Admitting: Nurse Practitioner

## 2019-09-12 LAB — NOVEL CORONAVIRUS, NAA: SARS-CoV-2, NAA: NOT DETECTED

## 2019-09-15 ENCOUNTER — Other Ambulatory Visit: Payer: Self-pay

## 2019-09-15 ENCOUNTER — Telehealth: Payer: Self-pay | Admitting: Nurse Practitioner

## 2019-09-15 NOTE — Telephone Encounter (Signed)
Patient has a follow up appointment scheduled. 

## 2019-09-16 ENCOUNTER — Encounter: Payer: Self-pay | Admitting: Nurse Practitioner

## 2019-09-16 ENCOUNTER — Other Ambulatory Visit: Payer: Self-pay

## 2019-09-16 ENCOUNTER — Ambulatory Visit (INDEPENDENT_AMBULATORY_CARE_PROVIDER_SITE_OTHER): Payer: Medicare HMO | Admitting: Nurse Practitioner

## 2019-09-16 VITALS — BP 142/69 | HR 60 | Temp 98.9°F | Resp 20 | Ht 66.0 in | Wt 218.0 lb

## 2019-09-16 DIAGNOSIS — I480 Paroxysmal atrial fibrillation: Secondary | ICD-10-CM

## 2019-09-16 DIAGNOSIS — Z952 Presence of prosthetic heart valve: Secondary | ICD-10-CM | POA: Diagnosis not present

## 2019-09-16 LAB — COAGUCHEK XS/INR WAIVED
INR: 2.3 — ABNORMAL HIGH (ref 0.9–1.1)
Prothrombin Time: 27.9 s

## 2019-09-16 NOTE — Progress Notes (Signed)
Subjective:   Chief COmplaint: INR recheck    Indication: atrial fibrillation Bleeding signs/symptoms: None Thromboembolic signs/symptoms: None  Missed Coumadin doses: None Medication changes: no Dietary changes: no Bacterial/viral infection: no Other concerns: no  The following portions of the patient's history were reviewed and updated as appropriate: allergies, current medications, past family history, past medical history, past social history, past surgical history and problem list.  Review of Systems Pertinent items noted in HPI and remainder of comprehensive ROS otherwise negative.   Objective:    INR Today: 2.3 Current dose: coumadin 5mg  daily except 2.5mg  on t,thurs and sat    Assessment:    Therapeutic INR for goal of 2.5-3.0  Plan:    1. New dose: no change   2. Next INR: 1 month    [name] , FNP

## 2019-09-25 ENCOUNTER — Other Ambulatory Visit: Payer: Self-pay

## 2019-09-25 DIAGNOSIS — G2581 Restless legs syndrome: Secondary | ICD-10-CM | POA: Diagnosis not present

## 2019-09-25 DIAGNOSIS — Z79891 Long term (current) use of opiate analgesic: Secondary | ICD-10-CM | POA: Diagnosis not present

## 2019-09-25 DIAGNOSIS — M5412 Radiculopathy, cervical region: Secondary | ICD-10-CM | POA: Diagnosis not present

## 2019-09-25 DIAGNOSIS — E0842 Diabetes mellitus due to underlying condition with diabetic polyneuropathy: Secondary | ICD-10-CM | POA: Diagnosis not present

## 2019-09-25 DIAGNOSIS — M25512 Pain in left shoulder: Secondary | ICD-10-CM | POA: Diagnosis not present

## 2019-09-26 ENCOUNTER — Encounter: Payer: Self-pay | Admitting: Nurse Practitioner

## 2019-09-26 ENCOUNTER — Ambulatory Visit: Payer: Medicare HMO | Admitting: Nurse Practitioner

## 2019-09-26 ENCOUNTER — Ambulatory Visit (INDEPENDENT_AMBULATORY_CARE_PROVIDER_SITE_OTHER): Payer: Medicare HMO | Admitting: Nurse Practitioner

## 2019-09-26 DIAGNOSIS — G2581 Restless legs syndrome: Secondary | ICD-10-CM

## 2019-09-26 DIAGNOSIS — E559 Vitamin D deficiency, unspecified: Secondary | ICD-10-CM

## 2019-09-26 DIAGNOSIS — Z950 Presence of cardiac pacemaker: Secondary | ICD-10-CM

## 2019-09-26 DIAGNOSIS — F5101 Primary insomnia: Secondary | ICD-10-CM

## 2019-09-26 DIAGNOSIS — I359 Nonrheumatic aortic valve disorder, unspecified: Secondary | ICD-10-CM

## 2019-09-26 DIAGNOSIS — I480 Paroxysmal atrial fibrillation: Secondary | ICD-10-CM | POA: Diagnosis not present

## 2019-09-26 DIAGNOSIS — F3342 Major depressive disorder, recurrent, in full remission: Secondary | ICD-10-CM

## 2019-09-26 DIAGNOSIS — E1140 Type 2 diabetes mellitus with diabetic neuropathy, unspecified: Secondary | ICD-10-CM | POA: Diagnosis not present

## 2019-09-26 DIAGNOSIS — Z794 Long term (current) use of insulin: Secondary | ICD-10-CM

## 2019-09-26 DIAGNOSIS — E1159 Type 2 diabetes mellitus with other circulatory complications: Secondary | ICD-10-CM

## 2019-09-26 DIAGNOSIS — I712 Thoracic aortic aneurysm, without rupture, unspecified: Secondary | ICD-10-CM

## 2019-09-26 DIAGNOSIS — E663 Overweight: Secondary | ICD-10-CM

## 2019-09-26 DIAGNOSIS — N1831 Chronic kidney disease, stage 3a: Secondary | ICD-10-CM | POA: Diagnosis not present

## 2019-09-26 DIAGNOSIS — I129 Hypertensive chronic kidney disease with stage 1 through stage 4 chronic kidney disease, or unspecified chronic kidney disease: Secondary | ICD-10-CM | POA: Diagnosis not present

## 2019-09-26 DIAGNOSIS — E039 Hypothyroidism, unspecified: Secondary | ICD-10-CM | POA: Diagnosis not present

## 2019-09-26 DIAGNOSIS — I152 Hypertension secondary to endocrine disorders: Secondary | ICD-10-CM

## 2019-09-26 MED ORDER — SERTRALINE HCL 100 MG PO TABS: 100.0000 mg | ORAL_TABLET | Freq: Every day | ORAL | 1 refills | Status: DC

## 2019-09-26 MED ORDER — AMLODIPINE BESYLATE 5 MG PO TABS: 2.5000 mg | ORAL_TABLET | Freq: Every day | ORAL | 3 refills | Status: DC

## 2019-09-26 MED ORDER — BASAGLAR KWIKPEN 100 UNIT/ML ~~LOC~~ SOPN: 40.0000 [IU] | PEN_INJECTOR | Freq: Every day | SUBCUTANEOUS | 5 refills | Status: DC

## 2019-09-26 MED ORDER — LEVOTHYROXINE SODIUM 100 MCG PO TABS: 100.0000 ug | ORAL_TABLET | Freq: Every day | ORAL | 0 refills | Status: DC

## 2019-09-26 MED ORDER — METFORMIN HCL 1000 MG PO TABS: ORAL_TABLET | ORAL | 1 refills | Status: DC

## 2019-09-26 MED ORDER — PRAVASTATIN SODIUM 40 MG PO TABS: 40.0000 mg | ORAL_TABLET | Freq: Every day | ORAL | 1 refills | Status: DC

## 2019-09-26 MED ORDER — FUROSEMIDE 40 MG PO TABS: 40.0000 mg | ORAL_TABLET | Freq: Every day | ORAL | 1 refills | Status: DC

## 2019-09-26 MED ORDER — AMIODARONE HCL 200 MG PO TABS: ORAL_TABLET | ORAL | 1 refills | Status: DC

## 2019-09-26 NOTE — Progress Notes (Signed)
Virtual Visit via telephone Note Due to COVID-19 pandemic this visit was conducted virtually. This visit type was conducted due to national recommendations for restrictions regarding the COVID-19 Pandemic (e.g. social distancing, sheltering in place) in an effort to limit this patient's exposure and mitigate transmission in our community. All issues noted in this document were discussed and addressed.  A physical exam was not performed with this format.  I connected with Andre Malone on 09/26/19 at 12:00 by telephone and verified that I am speaking with the correct person using two identifiers. Andre Malone is currently located at home and no one is currently with him during visit. The provider, [name] Hassell Done, FNP is located in their office at time of visit.  I discussed the limitations, risks, security and privacy concerns of performing an evaluation and management service by telephone and the availability of in person appointments. I also discussed with the patient that there may be a patient responsible charge related to this service. The patient expressed understanding and agreed to proceed.   History and Present Illness:   Chief Complaint: Medical Management of Chronic Issues    HPI:  1. Paroxysmal atrial fibrillation (HCC) Has no palpitations or heart racing.  2. Aortic valve disorder Has not seen cardiology in over 6 months- has follow up in 3 months.  3. Thoracic aortic aneurysm without rupture ([place])  4. Hypertension associated with diabetes ([place]) No c/o chest pain, ob or headaches. He checks his blood pressure on occasion. BP Readings from Last 3 Encounters:  09/16/19 (!) 142/69  07/24/19 (!) 145/79  06/04/19 (!) 142/74     5. Hypothyroidism, unspecified type No problems that he is aware of. Lab Results  Component Value Date   TSH 6.240 (H) 12/18/2017     6. Type 2 diabetes mellitus with diabetic neuropathy, with long-term current use of insulin  (HCC) His fasting blood sugar is running below 150. He is very strict with his diet. Denies any symptoms of low blood sugars. Lab Results  Component Value Date   HGBA1C 6.4 06/04/2019     7. Stage 3a chronic kidney disease No problems voiding- no edema Lab Results  Component Value Date   CREATININE 1.23 06/04/2019     8. Pacemaker  [employer] [place] Has pacemaker checked monthly- having no problems  9. RLS (restless legs syndrome) Is on mirapex and works well when he tales it.  10. Primary insomnia trazadone to sleep. He doe snot like taking it. Doe snot take every night because it makes him groggy the next day.  11. Recurrent major depressive disorder, in full remission [facility]) He was given celexa back in september but he did not like how it made him feel so he stopped taking. He would like to try something different.  Still grieving the death if his wife. Depression screen [facility] 2/9 09/16/2019 07/24/2019 06/04/2019  Decreased Interest 0 3 0  Down, Depressed, Hopeless 0 3 0  PHQ - 2 Score 0 6 0  Altered sleeping - 0 -  Tired, decreased energy - 1 -  Change in appetite - 2 -  Feeling bad or failure about yourself  - 0 -  Trouble concentrating - 1 -  Moving slowly or fidgety/restless - 0 -  Suicidal thoughts - 0 -  PHQ-9 Score - 10 -  Difficult doing work/chores - Not difficult at all -  Some recent data might be hidden     12. Vitamin D deficiency Takes daily vitamin d  supplement  13. Overweight No recent weight changes Wt Readings from Last 3 Encounters:  09/16/19 218 lb (98.9 kg)  07/24/19 217 lb (98.4 kg)  06/04/19 215 lb (97.5 kg)           Outpatient Encounter Medications as of 09/26/2019  Medication Sig  . acetaminophen (TYLENOL) 325 MG tablet Take 2 tablets (650 mg total) by mouth every 4 (four) hours as needed for headache or mild pain.  Marland Kitchen ALPRAZolam (XANAX) 0.25 MG tablet TAKE 1 TABLET (0.25 MG TOTAL) BY MOUTH 2 (TWO) TIMES DAILY AS NEEDED FOR ANXIETY.  Marland Kitchen  amiodarone (PACERONE) 200 MG tablet TAKE 1 TABLET BY MOUTH 2 TIMES DAILY. PLEASE SCHEDULE APPT FOR FUTURE REFILLS. 1ST ATTEMPT  . amLODipine (NORVASC) 5 MG tablet Take 0.5 tablets (2.5 mg total) by mouth daily.  Marland Kitchen aspirin EC 81 MG EC tablet Take 1 tablet (81 mg total) by mouth daily.  . citalopram (CELEXA) 20 MG tablet TAKE 1 TABLET BY MOUTH EVERY DAY  . clotrimazole (LOTRIMIN) 1 % cream Apply 1 application topically 2 (two) times daily.  . cyclobenzaprine (FLEXERIL) 10 MG tablet Take 1 tablet (10 mg total) by mouth 3 (three) times daily as needed for muscle spasms.  . furosemide (LASIX) 40 MG tablet Take 1 tablet (40 mg total) by mouth daily.  . Insulin Glargine (BASAGLAR KWIKPEN) 100 UNIT/ML SOPN Inject 0.4 mLs (40 Units total) into the skin daily.  Marland Kitchen levothyroxine (SYNTHROID) 100 MCG tablet Take 1 tablet (100 mcg total) by mouth daily. (Needs to be seen before next refill  . metFORMIN (GLUCOPHAGE) 1000 MG tablet TAKE 1 TABLET (1,000 MG TOTAL) BY MOUTH 2 (TWO) TIMES DAILY WITH A MEAL.  . Multiple Vitamin (MULTIVITAMIN WITH MINERALS) TABS tablet Take 1 tablet by mouth daily.  . pramipexole (MIRAPEX) 1.5 MG tablet TAKE 1 TABLET (1.5 MG TOTAL) BY MOUTH AT BEDTIME.  . pravastatin (PRAVACHOL) 40 MG tablet Take 1 tablet (40 mg total) by mouth daily.  . sertraline (ZOLOFT) 100 MG tablet Take 1 tablet (100 mg total) by mouth daily.  . tamsulosin (FLOMAX) 0.4 MG CAPS capsule Take 1 capsule (0.4 mg total) by mouth daily as needed. Reported on 01/05/2016  . traZODone (DESYREL) 100 MG tablet TAKE 1 TABLET BY MOUTH EVERYDAY AT BEDTIME  . warfarin (COUMADIN) 5 MG tablet TAKE 1/2 TABLET ON MONDAY AND THURSDAY AND THEN TAKE 1 TABLET ALL THE OTHER DAYS     Past Surgical History:  Procedure Laterality Date  . AORTIC ROOT REPLACEMENT    . AORTIC VALVE REPLACEMENT    . CARDIAC CATHETERIZATION    . CARDIAC VALVE REPLACEMENT    . LEAD REVISION N/A 09/17/2013   Procedure: LEAD REVISION;  Surgeon: Duke Salvia,  MD;  Location: [facility] CATH LAB;  Service: Cardiovascular;  Laterality: N/A;  . LEFT HEART CATHETERIZATION WITH CORONARY ANGIOGRAM N/A 05/30/2013   Procedure: LEFT HEART CATHETERIZATION WITH CORONARY ANGIOGRAM;  Surgeon: Peter M [place], MD;  Location[facility] CATH LAB;  Service: Cardiovascular;  Laterality: N/A;  . PACEMAKER INSERTION  05/17/2009; 09/17/2013   STJ dual chamber pacemaker implanted for CHB; RV lead revision and generator change 09/17/2013 by Dr Graciela Husbands for ventricular lead malfunction  . Right elbow surgery    . Subxiphoid window    . TONSILLECTOMY    . TOTAL HIP ARTHROPLASTY  02/27/2012   Procedure: TOTAL HIP ARTHROPLASTY - left  Surgeon: Valeria Batman, MD;  [facility];  Service: Orthopedics;  Laterality: Left;    Family  History  Problem Relation Age of Onset  . Heart disease Father        Deceased  . Alcohol abuse Father   . Other Mother        Deceased at young age  . Stroke Mother        after delivery  . Heart attack Brother   . Other Brother        MVA  . Heart disease Paternal Grandfather   . Healthy Son   . Healthy Daughter     New complaints: None today  Social history: Lives by hisself- is losing his house due to death of his wife  Controlled substance contract: n/a    Review of Systems  Constitutional: Negative for diaphoresis and weight loss.  Eyes: Negative for blurred vision, double vision and pain.  Respiratory: Negative for shortness of breath.   Cardiovascular: Negative for chest pain, palpitations, orthopnea and leg swelling.  Gastrointestinal: Negative for abdominal pain.  Skin: Negative for rash.  Neurological: Negative for dizziness, sensory change, loss of consciousness, weakness and headaches.  Endo/Heme/Allergies: Negative for polydipsia. Does not bruise/bleed easily.  Psychiatric/Behavioral: Positive for depression. Negative for memory loss. The patient does not have insomnia.   All other systems reviewed and are  negative.    Observations/Objective: Alert and oriented- answers all questions appropriately No distress Speech slow and soft   Assessment and Plan: NIL XIONG comes in today with chief complaint of Medical Management of Chronic Issues   Diagnosis and orders addressed:  1. Paroxysmal atrial fibrillation (HCC) Keep appontment for coumadin clinic  2. Aortic valve disorder Keep follow up with cardiology  3. Thoracic aortic aneurysm without rupture (HCC)  4. Hypertension associated with diabetes (HCC) Low sodium diet - furosemide (LASIX) 40 MG tablet; Take 1 tablet (40 mg total) by mouth daily.  Dispense: 90 tablet; Refill: 1 - amLODipine (NORVASC) 5 MG tablet; Take 0.5 tablets (2.5 mg total) by mouth daily.  Dispense: 45 tablet; Refill: 3  5. Hypothyroidism, unspecified type - levothyroxine (SYNTHROID) 100 MCG tablet; Take 1 tablet (100 mcg total) by mouth daily. (Needs to be seen before next refill  Dispense: 90 tablet; Refill: 0  6. Type 2 diabetes mellitus with diabetic neuropathy, with long-term current use of insulin (HCC) Continue current meds continueto watch carbsin diet - Insulin Glargine (BASAGLAR KWIKPEN) 100 UNIT/ML SOPN; Inject 0.4 mLs (40 Units total) into the skin daily.  Dispense: 15 mL; Refill: 5 - metFORMIN (GLUCOPHAGE) 1000 MG tablet; TAKE 1 TABLET (1,000 MG TOTAL) BY MOUTH 2 (TWO) TIMES DAILY WITH A MEAL.  Dispense: 180 tablet; Refill: 1  7. Stage 3a chronic kidney disease Will check labs at next visit  8. Pacemaker  [facility] - amiodarone (PACERONE) 200 MG tablet; TAKE 1 TABLET BY MOUTH 2 TIMES DAILY. PLEASE SCHEDULE APPT FOR FUTURE REFILLS. 1ST ATTEMPT  Dispense: 180 tablet; Refill: 1  9. RLS (restless legs syndrome) kep legs warm at night  10. Primary insomnia Bedtime routine  11. Recurrent major depressive disorder, in full remission (HCC) Stop celexa Changed to zoloft daily - sertraline (ZOLOFT) 100 MG tablet; Take 1 tablet (100 mg  total) by mouth daily.  Dispense: 90 tablet; Refill: 1  12. Vitamin D deficiency Continue daily vitamin d supplement  13. Overweight Discussed diet and exercise for person with BMI >25 Will recheck weight in 3-6 months   Labs pending Health Maintenance reviewed Diet and exercise encouraged  Follow up plan: 3 months  I discussed the assessment and treatment plan with the patient. The patient was provided an opportunity to ask questions and all were answered. The patient agreed with the plan and demonstrated an understanding of the instructions.   The patient was advised to call back or seek an in-person evaluation if the symptoms worsen or if the condition fails to improve as anticipated.  The above assessment and management plan was discussed with the patient. The patient verbalized understanding of and has agreed to the management plan. Patient is aware to call the clinic if symptoms persist or worsen. Patient is aware when to return to the clinic for a follow-up visit. Patient educated on when it is appropriate to go to the emergency department.   Time call ended:  12:21  I provided 21 minutes of non-face-to-face time during this encounter.    [name] Daphine Deutscher, FNP

## 2019-10-07 ENCOUNTER — Telehealth: Payer: Self-pay | Admitting: Nurse Practitioner

## 2019-10-07 ENCOUNTER — Encounter: Payer: Medicare HMO | Admitting: Nurse Practitioner

## 2019-10-07 NOTE — Telephone Encounter (Signed)
FYI

## 2019-10-07 NOTE — Progress Notes (Signed)
   Virtual Visit via telephone Note Due to COVID-19 pandemic this visit was conducted virtually. This visit type was conducted due to national recommendations for restrictions regarding the COVID-19 Pandemic (e.g. social distancing, sheltering in place) in an effort to limit this patient's exposure and mitigate transmission in our community. All issues noted in this document were discussed and addressed.  A physical exam was not performed with this format.  I connected was not able to connect  Sara Lee on 10/07/19   Was unable to reach patient by phone. He called back and left message that rash had resolved.

## 2019-10-13 ENCOUNTER — Ambulatory Visit (INDEPENDENT_AMBULATORY_CARE_PROVIDER_SITE_OTHER): Payer: Medicare HMO | Admitting: *Deleted

## 2019-10-13 DIAGNOSIS — I442 Atrioventricular block, complete: Secondary | ICD-10-CM | POA: Diagnosis not present

## 2019-10-13 LAB — CUP PACEART REMOTE DEVICE CHECK
Battery Remaining Longevity: 49 mo
Battery Remaining Percentage: 64 %
Battery Voltage: 2.9 V
Brady Statistic AP VP Percent: 89 %
Brady Statistic AP VS Percent: 1 %
Brady Statistic AS VP Percent: 11 %
Brady Statistic AS VS Percent: 1 %
Brady Statistic RA Percent Paced: 90 %
Brady Statistic RV Percent Paced: 99 %
Date Time Interrogation Session: 20210208020015
Implantable Lead Implant Date: 20100913
Implantable Lead Implant Date: 20150114
Implantable Lead Location: 753859
Implantable Lead Location: 753860
Implantable Pulse Generator Implant Date: 20150114
Lead Channel Impedance Value: 260 Ohm
Lead Channel Impedance Value: 460 Ohm
Lead Channel Pacing Threshold Amplitude: 0.75 V
Lead Channel Pacing Threshold Amplitude: 1 V
Lead Channel Pacing Threshold Pulse Width: 0.5 ms
Lead Channel Pacing Threshold Pulse Width: 0.8 ms
Lead Channel Sensing Intrinsic Amplitude: 3.1 mV
Lead Channel Sensing Intrinsic Amplitude: 7.5 mV
Lead Channel Setting Pacing Amplitude: 2.5 V
Lead Channel Setting Pacing Amplitude: 2.5 V
Lead Channel Setting Pacing Pulse Width: 0.5 ms
Lead Channel Setting Sensing Sensitivity: 4 mV
Pulse Gen Model: 2240
Pulse Gen Serial Number: 7586540

## 2019-10-14 ENCOUNTER — Ambulatory Visit: Payer: Self-pay | Admitting: Nurse Practitioner

## 2019-10-14 NOTE — Progress Notes (Signed)
PPM Remote  

## 2019-10-17 ENCOUNTER — Encounter: Payer: Self-pay | Admitting: Nurse Practitioner

## 2019-10-23 ENCOUNTER — Telehealth: Payer: Self-pay | Admitting: *Deleted

## 2019-10-23 NOTE — Telephone Encounter (Signed)
lmtcb to reschedule INR appt

## 2019-11-07 DIAGNOSIS — R0602 Shortness of breath: Secondary | ICD-10-CM | POA: Diagnosis not present

## 2019-11-07 DIAGNOSIS — R5381 Other malaise: Secondary | ICD-10-CM | POA: Diagnosis not present

## 2019-11-22 ENCOUNTER — Ambulatory Visit: Payer: Medicare HMO | Attending: Internal Medicine

## 2019-11-22 DIAGNOSIS — Z23 Encounter for immunization: Secondary | ICD-10-CM

## 2019-11-22 NOTE — Progress Notes (Signed)
   Covid-19 Vaccination Clinic  Name:  Andre Malone    MRN: [id number] DOB: Jan 01, 1951  11/22/2019  [name] was observed post Covid-19 immunization for 30 minutes based on pre-vaccination screening without incident. He was provided with Vaccine Information Sheet and instruction to access the V-Safe system.   [name] was instructed to call 911 with any severe reactions post vaccine: Marland Kitchen Difficulty breathing  . Swelling of face and throat  . A fast heartbeat  . A bad rash all over body  . Dizziness and weakness   Immunizations Administered    Name Date Dose VIS Date Route   Moderna COVID-19 Vaccine 11/22/2019 11:05 AM 0.5 mL 08/05/2019 Intramuscular   Manufacturer: Moderna   Lot: [id number]   NDC: 03403-524-81

## 2019-12-18 DIAGNOSIS — M545 Low back pain, unspecified: Secondary | ICD-10-CM | POA: Insufficient documentation

## 2019-12-18 DIAGNOSIS — Z79891 Long term (current) use of opiate analgesic: Secondary | ICD-10-CM | POA: Diagnosis not present

## 2019-12-18 DIAGNOSIS — R69 Illness, unspecified: Secondary | ICD-10-CM | POA: Diagnosis not present

## 2019-12-18 DIAGNOSIS — M25519 Pain in unspecified shoulder: Secondary | ICD-10-CM | POA: Diagnosis not present

## 2019-12-18 DIAGNOSIS — M5412 Radiculopathy, cervical region: Secondary | ICD-10-CM | POA: Diagnosis not present

## 2019-12-18 DIAGNOSIS — G2581 Restless legs syndrome: Secondary | ICD-10-CM | POA: Diagnosis not present

## 2019-12-18 DIAGNOSIS — E1342 Other specified diabetes mellitus with diabetic polyneuropathy: Secondary | ICD-10-CM | POA: Diagnosis not present

## 2019-12-22 ENCOUNTER — Encounter (HOSPITAL_COMMUNITY): Payer: Self-pay

## 2019-12-22 ENCOUNTER — Emergency Department (HOSPITAL_COMMUNITY)
Admission: EM | Admit: 2019-12-22 | Discharge: 2019-12-24 | Disposition: A | Discharge status: Other Acute Inpatient Hospital | Payer: Medicare HMO | Attending: Emergency Medicine | Admitting: Emergency Medicine

## 2019-12-22 ENCOUNTER — Emergency Department (HOSPITAL_COMMUNITY): Payer: Medicare HMO

## 2019-12-22 ENCOUNTER — Other Ambulatory Visit: Payer: Self-pay

## 2019-12-22 DIAGNOSIS — N189 Chronic kidney disease, unspecified: Secondary | ICD-10-CM | POA: Diagnosis not present

## 2019-12-22 DIAGNOSIS — F322 Major depressive disorder, single episode, severe without psychotic features: Secondary | ICD-10-CM | POA: Insufficient documentation

## 2019-12-22 DIAGNOSIS — Z03818 Encounter for observation for suspected exposure to other biological agents ruled out: Secondary | ICD-10-CM | POA: Diagnosis not present

## 2019-12-22 DIAGNOSIS — T1491XA Suicide attempt, initial encounter: Secondary | ICD-10-CM | POA: Diagnosis present

## 2019-12-22 DIAGNOSIS — Y9389 Activity, other specified: Secondary | ICD-10-CM | POA: Diagnosis not present

## 2019-12-22 DIAGNOSIS — Z7982 Long term (current) use of aspirin: Secondary | ICD-10-CM | POA: Diagnosis not present

## 2019-12-22 DIAGNOSIS — X781XXA Intentional self-harm by knife, initial encounter: Secondary | ICD-10-CM | POA: Diagnosis not present

## 2019-12-22 DIAGNOSIS — Z20822 Contact with and (suspected) exposure to covid-19: Secondary | ICD-10-CM | POA: Diagnosis not present

## 2019-12-22 DIAGNOSIS — I5023 Acute on chronic systolic (congestive) heart failure: Secondary | ICD-10-CM | POA: Insufficient documentation

## 2019-12-22 DIAGNOSIS — I130 Hypertensive heart and chronic kidney disease with heart failure and stage 1 through stage 4 chronic kidney disease, or unspecified chronic kidney disease: Secondary | ICD-10-CM | POA: Diagnosis not present

## 2019-12-22 DIAGNOSIS — Z23 Encounter for immunization: Secondary | ICD-10-CM | POA: Diagnosis not present

## 2019-12-22 DIAGNOSIS — R1084 Generalized abdominal pain: Secondary | ICD-10-CM | POA: Insufficient documentation

## 2019-12-22 DIAGNOSIS — R69 Illness, unspecified: Secondary | ICD-10-CM | POA: Diagnosis not present

## 2019-12-22 DIAGNOSIS — Z794 Long term (current) use of insulin: Secondary | ICD-10-CM | POA: Insufficient documentation

## 2019-12-22 DIAGNOSIS — Z7901 Long term (current) use of anticoagulants: Secondary | ICD-10-CM | POA: Insufficient documentation

## 2019-12-22 DIAGNOSIS — N132 Hydronephrosis with renal and ureteral calculous obstruction: Secondary | ICD-10-CM | POA: Diagnosis not present

## 2019-12-22 DIAGNOSIS — R52 Pain, unspecified: Secondary | ICD-10-CM | POA: Diagnosis not present

## 2019-12-22 DIAGNOSIS — R319 Hematuria, unspecified: Secondary | ICD-10-CM | POA: Diagnosis not present

## 2019-12-22 DIAGNOSIS — Y999 Unspecified external cause status: Secondary | ICD-10-CM | POA: Insufficient documentation

## 2019-12-22 DIAGNOSIS — R45851 Suicidal ideations: Secondary | ICD-10-CM | POA: Diagnosis not present

## 2019-12-22 DIAGNOSIS — R0902 Hypoxemia: Secondary | ICD-10-CM | POA: Diagnosis not present

## 2019-12-22 DIAGNOSIS — I10 Essential (primary) hypertension: Secondary | ICD-10-CM | POA: Diagnosis not present

## 2019-12-22 DIAGNOSIS — Y92009 Unspecified place in unspecified non-institutional (private) residence as the place of occurrence of the external cause: Secondary | ICD-10-CM | POA: Insufficient documentation

## 2019-12-22 LAB — CBC WITH DIFFERENTIAL/PLATELET
Abs Immature Granulocytes: 0.07 10*3/uL (ref 0.00–0.07)
Basophils Absolute: 0.1 10*3/uL (ref 0.0–0.1)
Basophils Relative: 1 %
Eosinophils Absolute: 0.2 10*3/uL (ref 0.0–0.5)
Eosinophils Relative: 2 %
HCT: 48.6 % (ref 39.0–52.0)
Hemoglobin: 16.2 g/dL (ref 13.0–17.0)
Immature Granulocytes: 1 %
Lymphocytes Relative: 9 %
Lymphs Abs: 1.3 10*3/uL (ref 0.7–4.0)
MCH: 30.4 pg (ref 26.0–34.0)
MCHC: 33.3 g/dL (ref 30.0–36.0)
MCV: 91.2 fL (ref 80.0–100.0)
Monocytes Absolute: 1.3 10*3/uL — ABNORMAL HIGH (ref 0.1–1.0)
Monocytes Relative: 8 %
Neutro Abs: 12 10*3/uL — ABNORMAL HIGH (ref 1.7–7.7)
Neutrophils Relative %: 79 %
Platelets: 285 10*3/uL (ref 150–400)
RBC: 5.33 MIL/uL (ref 4.22–5.81)
RDW: 14.6 % (ref 11.5–15.5)
WBC: 14.9 10*3/uL — ABNORMAL HIGH (ref 4.0–10.5)
nRBC: 0 % (ref 0.0–0.2)

## 2019-12-22 LAB — RESPIRATORY PANEL BY RT PCR (FLU A&B, COVID)
Influenza A by PCR: NEGATIVE
Influenza B by PCR: NEGATIVE
SARS Coronavirus 2 by RT PCR: NEGATIVE

## 2019-12-22 LAB — COMPREHENSIVE METABOLIC PANEL
ALT: 18 U/L (ref 0–44)
AST: 25 U/L (ref 15–41)
Albumin: 4.4 g/dL (ref 3.5–5.0)
Alkaline Phosphatase: 68 U/L (ref 38–126)
Anion gap: 15 (ref 5–15)
BUN: 19 mg/dL (ref 8–23)
CO2: 18 mmol/L — ABNORMAL LOW (ref 22–32)
Calcium: 9 mg/dL (ref 8.9–10.3)
Chloride: 102 mmol/L (ref 98–111)
Creatinine, Ser: 1.41 mg/dL — ABNORMAL HIGH (ref 0.61–1.24)
GFR calc Af Amer: 59 mL/min — ABNORMAL LOW (ref >60)
GFR calc non Af Amer: 51 mL/min — ABNORMAL LOW (ref >60)
Glucose, Bld: 159 mg/dL — ABNORMAL HIGH (ref 70–99)
Potassium: 3.1 mmol/L — ABNORMAL LOW (ref 3.5–5.1)
Sodium: 135 mmol/L (ref 135–145)
Total Bilirubin: 0.8 mg/dL (ref 0.3–1.2)
Total Protein: 8.2 g/dL — ABNORMAL HIGH (ref 6.5–8.1)

## 2019-12-22 LAB — URINALYSIS, ROUTINE W REFLEX MICROSCOPIC
Bilirubin Urine: NEGATIVE
Glucose, UA: 50 mg/dL — AB
Ketones, ur: NEGATIVE mg/dL
Leukocytes,Ua: NEGATIVE
Nitrite: NEGATIVE
Protein, ur: 100 mg/dL — AB
RBC / HPF: >50 RBC/hpf — ABNORMAL HIGH (ref 0–5)
Specific Gravity, Urine: 1.013 (ref 1.005–1.030)
pH: 5 (ref 5.0–8.0)

## 2019-12-22 LAB — PROTIME-INR
INR: 5.7 (ref 0.8–1.2)
Prothrombin Time: 51.6 seconds — ABNORMAL HIGH (ref 11.4–15.2)

## 2019-12-22 LAB — RAPID URINE DRUG SCREEN, HOSP PERFORMED
Amphetamines: NOT DETECTED
Barbiturates: NOT DETECTED
Benzodiazepines: NOT DETECTED
Cocaine: NOT DETECTED
Opiates: NOT DETECTED
Tetrahydrocannabinol: NOT DETECTED

## 2019-12-22 LAB — CARBOXYHEMOGLOBIN - COOX: Carboxyhemoglobin: 3.8 % — ABNORMAL HIGH (ref 0.5–1.5)

## 2019-12-22 LAB — ETHANOL: Alcohol, Ethyl (B): 180 mg/dL — ABNORMAL HIGH (ref <10)

## 2019-12-22 LAB — CBG MONITORING, ED: Glucose-Capillary: 140 mg/dL — ABNORMAL HIGH (ref 70–99)

## 2019-12-22 MED ORDER — HYDROMORPHONE HCL 1 MG/ML IJ SOLN
1.0000 mg | Freq: Once | INTRAMUSCULAR | Status: AC
  Administered 2019-12-22: 1 mg via INTRAVENOUS
  Filled 2019-12-22: qty 1

## 2019-12-22 MED ORDER — TETANUS-DIPHTH-ACELL PERTUSSIS 5-2.5-18.5 LF-MCG/0.5 IM SUSP
0.5000 mL | Freq: Once | INTRAMUSCULAR | Status: AC
  Administered 2019-12-23: 0.5 mL via INTRAMUSCULAR
  Filled 2019-12-22: qty 0.5

## 2019-12-22 MED ORDER — LABETALOL HCL 5 MG/ML IV SOLN
10.0000 mg | Freq: Once | INTRAVENOUS | Status: AC
  Administered 2019-12-22: 10 mg via INTRAVENOUS
  Filled 2019-12-22: qty 4

## 2019-12-22 MED ORDER — TAMSULOSIN HCL 0.4 MG PO CAPS
0.4000 mg | ORAL_CAPSULE | Freq: Every day | ORAL | Status: DC
  Administered 2019-12-22 – 2019-12-24 (×3): 0.4 mg via ORAL
  Filled 2019-12-22 (×3): qty 1

## 2019-12-22 MED ORDER — MORPHINE SULFATE (PF) 4 MG/ML IV SOLN
4.0000 mg | Freq: Once | INTRAVENOUS | Status: AC
  Administered 2019-12-22: 4 mg via INTRAVENOUS
  Filled 2019-12-22: qty 1

## 2019-12-22 MED ORDER — BACITRACIN ZINC 500 UNIT/GM EX OINT
1.0000 "application " | TOPICAL_OINTMENT | Freq: Two times a day (BID) | CUTANEOUS | Status: DC
  Administered 2019-12-23 – 2019-12-24 (×4): 1 via TOPICAL
  Filled 2019-12-22 (×4): qty 0.9

## 2019-12-22 NOTE — ED Notes (Signed)
Pt placed on non-rebreather per EDP.   Pharmacy tech at bedside reviewing medications at this time.

## 2019-12-22 NOTE — ED Triage Notes (Addendum)
Pt brought in EMS, emergency commitment via [facility]. Per EMS, pt was found in car with garage door pulled down and water hose in muffler. Per neighbor pt had been drinking earlier today. En route pt stated "will you take my body and god my soul". Pt spitting and verbally aggressive at time of arrival.   Per EMS, pt has not taken medication in 3 days and has been on the decline since wife passed away in 07/23/20. Per EMS, pt reports chronic kidney pain that radiates to testicle.

## 2019-12-22 NOTE — ED Notes (Signed)
RT at bedside obtaining carboxyhemoglobin.

## 2019-12-22 NOTE — ED Notes (Signed)
Pt taken to CT at this time and accompanied by this RN.

## 2019-12-22 NOTE — ED Notes (Signed)
TTS complete 

## 2019-12-22 NOTE — ED Notes (Signed)
Pt daughter in law called and inquired about pt update. Pt verbalized permission to discuss case with daughter in law and son.   Daughter in law [phone] Pt [phone]

## 2019-12-22 NOTE — ED Notes (Signed)
Date and time results received: 12/22/19 1420 (use smartphrase ".now" to insert current time)  Test: INR Critical Value: 5.7  Name of Provider Notified: miller,md  Orders Received? Or Actions Taken?:
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
## 2019-12-22 NOTE — ED Triage Notes (Signed)
Pt reports attempted suicide today by inhaling exhaust fumes.  Pt says is suicidal because he has pain and no meds.  Reports is depressed.  Reports has been drinking etoh today.  Pt brought by EMS, [facility] with pt.

## 2019-12-22 NOTE — ED Notes (Signed)
Signed off on officer. Pt calm and cooperative at this time.Forensice restraints removed.
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
## 2019-12-22 NOTE — ED Notes (Signed)
Pt back in room from CT 

## 2019-12-23 LAB — PROTIME-INR
INR: 5.8 (ref 0.8–1.2)
Prothrombin Time: 52.4 seconds — ABNORMAL HIGH (ref 11.4–15.2)

## 2019-12-23 LAB — CBG MONITORING, ED
Glucose-Capillary: 234 mg/dL — ABNORMAL HIGH (ref 70–99)
Glucose-Capillary: 144 mg/dL — ABNORMAL HIGH (ref 70–99)
Glucose-Capillary: 135 mg/dL — ABNORMAL HIGH (ref 70–99)

## 2019-12-23 MED ORDER — AMIODARONE HCL 200 MG PO TABS
200.0000 mg | ORAL_TABLET | Freq: Two times a day (BID) | ORAL | Status: DC
  Administered 2019-12-23 – 2019-12-24 (×4): 200 mg via ORAL
  Filled 2019-12-23 (×4): qty 1

## 2019-12-23 MED ORDER — ASPIRIN 81 MG PO CHEW
81.0000 mg | CHEWABLE_TABLET | Freq: Every day | ORAL | Status: DC
  Administered 2019-12-23 – 2019-12-24 (×2): 81 mg via ORAL
  Filled 2019-12-23 (×2): qty 1

## 2019-12-23 MED ORDER — TRAZODONE HCL 50 MG PO TABS
100.0000 mg | ORAL_TABLET | Freq: Every evening | ORAL | Status: DC | PRN
  Administered 2019-12-24: 100 mg via ORAL
  Filled 2019-12-23: qty 2

## 2019-12-23 MED ORDER — METFORMIN HCL 500 MG PO TABS
1000.0000 mg | ORAL_TABLET | Freq: Two times a day (BID) | ORAL | Status: DC
  Administered 2019-12-23 – 2019-12-24 (×4): 1000 mg via ORAL
  Filled 2019-12-23 (×4): qty 2

## 2019-12-23 MED ORDER — OXYCODONE HCL 5 MG PO TABS
15.0000 mg | ORAL_TABLET | Freq: Four times a day (QID) | ORAL | Status: DC | PRN
  Administered 2019-12-23 – 2019-12-24 (×6): 15 mg via ORAL
  Filled 2019-12-23 (×6): qty 3

## 2019-12-23 MED ORDER — WARFARIN SODIUM 2.5 MG PO TABS: 2.5000 mg | ORAL_TABLET | ORAL | Status: DC

## 2019-12-23 MED ORDER — FLUOXETINE HCL 10 MG PO CAPS
10.0000 mg | ORAL_CAPSULE | Freq: Every day | ORAL | Status: DC
  Administered 2019-12-23 – 2019-12-24 (×2): 10 mg via ORAL
  Filled 2019-12-23 (×2): qty 1

## 2019-12-23 MED ORDER — LEVOTHYROXINE SODIUM 50 MCG PO TABS
100.0000 ug | ORAL_TABLET | Freq: Every day | ORAL | Status: DC
  Administered 2019-12-23 – 2019-12-24 (×2): 100 ug via ORAL
  Filled 2019-12-23 (×2): qty 2

## 2019-12-23 NOTE — ED Notes (Signed)
Dressing applied to left wrist
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
## 2019-12-23 NOTE — Progress Notes (Signed)
Patient ID: Andre Malone, male   DOB: [birth year], 69 y.o.   MRN: [id number]  Psychiatric reassessment   [id number] Andre Malone is a widowed 69 y.o. male who presents involuntarily to [place] via RSD/EMS. Per EMS, pt was found in car with garage door pulled down and water hose in muffler. Per neighbor, pt had been drinking earlier today. Per EMS, pt has not taken medication in 3 days and has been on the decline since his wife passed away last Christmas.  Per EMS, pt reports chronic kidney pain that radiates to testicle.Pt writhed in pain throughout assessment. Pt denies history of substance abuse and mental illness. Pt reports having concerns about taking some of his medications. Pt admits he made a suicide attempt today due to physical pain. Pt states his deceased wife was a Marine scientist and always took care of him. Pt denies past suicide attempts. Pt acknowledges multiple symptoms of Depression, including anhedonia, isolating, changes in sleep & appetite, & increased irritability. Pt denies homicidal ideation/ history of violence. Pt denies auditory & visual hallucinations & other symptoms of psychosis. Pt states current stressors include physical pain and depression.    Psychiatric evaluation: Andre Malone is a  69 y.o. male who presented to [facility] following a suicide attempt. During this evaluation, he is alert and oriented x4, calm and cooperative. He admitted that he intentionally tried to kill himself by,"sticking a hose in the car muffler and turning the car on." He stated that he could not recall what happened as he must've black out and was found by someone which turned out to be his neighbor. He admitted to drinking 4-5 shots of whiskey that night although stated that he is not a frequent drinker and that he was depressed which cause him to drink. He reported a number of recent losses to included frined who passed away a month before his wife passed away before Christmas and a best frined who passed away  one month ago. Her added that he is in the process of loosing his home that he and his wife built many years ago do to financial  constraints. Stated that he lives alone and has limited social support. Stated that he has one child, a son, although stated that his son is a Pharmacist, hospital, has his own life, and seems to be to busy for him. He stated that he does have firearms in the home. He noted a number of medical conditions that causes severe pain and identifies this as a stressor. Reported that he has never been psychiatrically hospitalized, denied prior suicide attempts or self-harming behaviors, has no therapy or grief counseling, although when his wife passed away, he was prescribed Zoloft for one month although he stopped taking it. He denied other substance abuse or use. Denied homicidal ideations or psychosis. He continued to endorse ongoing depression as noted above.   Disposition: There are numerous risk factors present that places patient at a high risk for suicide to include; age, race, gender, current suicide attempt (lethal and severe), living alone, recent losses, limited social support, financial constraints, health issues causing chronic pain, depression, and history of being on antidepressant medication. He initially stated that he wanted to go home although we dicussed his risk factors along with the benefits of inpatient psychiatric hospitalization. He agreed to go voluntarily to a psychiatric hospital for safety and stability. We discussed his depression and he stated that he wanted to go back on a antidepressant although not Zoloft. We discussed Prozac which he  agreed and he medication has been ordered. At this time, I am recommending inpatient psychiatric hospitalization. Patient is receptive to this disposition.

## 2019-12-23 NOTE — ED Notes (Signed)
Date and time results received: 12/23/19 1026  Test: INR Critical Value: 5.8  Name of Provider Notified: Dr Juleen China  Orders Received? Or Actions Taken?: see new orders

## 2019-12-23 NOTE — Progress Notes (Addendum)
CSW left message with admissions staff at [facility] to assess bed availability. A return phone call was requested.   Wells Guiles, LCSW, LCAS Disposition CSW [facility]/TTS [phone]   UPDATE: Received phone call from April at [facility]. They are on a mental health capacity alert and have no bed openings today. She encouraged CSW to call daily.

## 2019-12-23 NOTE — ED Notes (Signed)
Pt's belongings locked in locker room. Pt medications counted and taken to pharmacy.

## 2019-12-23 NOTE — Progress Notes (Addendum)
Pt meets inpatient criteria per Denzil Magnuson, NP. Referral information has been sent to the following hospitals for review. Pt is a [facility] and [facility] is [facility] (no beds available today).   [facility] Details  [facility] Details [facility] Details [facility] Details [facility]  Disposition will continue to assist with inpatient placement needs.   Wells Guiles, LCSW, LCAS Disposition CSW [facility]/TTS [phone]

## 2019-12-24 ENCOUNTER — Ambulatory Visit: Payer: Medicare HMO

## 2019-12-24 ENCOUNTER — Encounter: Payer: Self-pay | Admitting: General Practice

## 2019-12-24 DIAGNOSIS — T1491XA Suicide attempt, initial encounter: Secondary | ICD-10-CM | POA: Diagnosis not present

## 2019-12-24 DIAGNOSIS — R69 Illness, unspecified: Secondary | ICD-10-CM | POA: Diagnosis not present

## 2019-12-24 DIAGNOSIS — F4329 Adjustment disorder with other symptoms: Secondary | ICD-10-CM | POA: Diagnosis not present

## 2019-12-24 DIAGNOSIS — I10 Essential (primary) hypertension: Secondary | ICD-10-CM | POA: Diagnosis not present

## 2019-12-24 DIAGNOSIS — Z634 Disappearance and death of family member: Secondary | ICD-10-CM | POA: Diagnosis not present

## 2019-12-24 DIAGNOSIS — F322 Major depressive disorder, single episode, severe without psychotic features: Secondary | ICD-10-CM | POA: Diagnosis not present

## 2019-12-24 DIAGNOSIS — R45851 Suicidal ideations: Secondary | ICD-10-CM | POA: Diagnosis not present

## 2019-12-24 DIAGNOSIS — Z20822 Contact with and (suspected) exposure to covid-19: Secondary | ICD-10-CM | POA: Diagnosis not present

## 2019-12-24 DIAGNOSIS — E119 Type 2 diabetes mellitus without complications: Secondary | ICD-10-CM | POA: Diagnosis not present

## 2019-12-24 LAB — CBC
HCT: 41.8 % (ref 39.0–52.0)
Hemoglobin: 13.5 g/dL (ref 13.0–17.0)
MCH: 30.1 pg (ref 26.0–34.0)
MCHC: 32.3 g/dL (ref 30.0–36.0)
MCV: 93.3 fL (ref 80.0–100.0)
Platelets: 169 10*3/uL (ref 150–400)
RBC: 4.48 MIL/uL (ref 4.22–5.81)
RDW: 15.2 % (ref 11.5–15.5)
WBC: 6.7 10*3/uL (ref 4.0–10.5)
nRBC: 0 % (ref 0.0–0.2)

## 2019-12-24 LAB — PROTIME-INR
INR: 4 — ABNORMAL HIGH (ref 0.8–1.2)
INR: 3.5 — ABNORMAL HIGH (ref 0.8–1.2)
Prothrombin Time: 39.1 seconds — ABNORMAL HIGH (ref 11.4–15.2)
Prothrombin Time: 34.9 seconds — ABNORMAL HIGH (ref 11.4–15.2)

## 2019-12-24 LAB — CBG MONITORING, ED
Glucose-Capillary: 94 mg/dL (ref 70–99)
Glucose-Capillary: 141 mg/dL — ABNORMAL HIGH (ref 70–99)

## 2019-12-24 NOTE — Progress Notes (Signed)
[facility] is reviewing pt for admission. They are requesting that his INR be retested and that a note from a provider be written documenting that he is medically clear. [facility] charge RN notified.   Wells Guiles, LCSW, LCAS Disposition CSW [facility]/TTS [phone]

## 2019-12-24 NOTE — Progress Notes (Signed)
Updated INR results have been faxed to [facility] for review. Andre Malone at [place] states that pt will be accepted once an ED provider documents that pt is medically clear for psychiatric admission.   Wells Guiles, LCSW, LCAS Disposition CSW [facility]/TTS [phone]

## 2019-12-24 NOTE — ED Provider Notes (Addendum)
Emergency Medicine Observation Re-evaluation Note  Andre Malone is a 69 y.o. male, seen on rounds today.  Pt initially presented to the ED for complaints of V70.1 and Suicidal Currently, the patient is stable and medically cleared - cooperative with staff - he is under IVC.  Physical Exam  BP 120/65 (BP Location: Right Arm)   Pulse 62   Temp 97.8 F (36.6 C) (Oral)   Resp 18   Ht 1.676 m (5\' 6" )   Wt 93.4 kg   SpO2 100%   BMI 33.25 kg/m  Physical Exam Constitutional:      General: He is not in acute distress. HENT:     Head: Normocephalic and atraumatic.  Cardiovascular:     Rate and Rhythm: Normal rate and regular rhythm.  Musculoskeletal:        General: No swelling or tenderness.  Skin:    General: Skin is warm and dry.  Neurological:     Mental Status: He is alert.  Psychiatric:     Comments: Depressed and sad - no active hallucinations, endorses SI.     ED Course / MDM  EKG:EKG Interpretation  Date/Time:  Monday December 22 2019 14:04:14 EDT Ventricular Rate:  73 PR Interval:    QRS Duration: 235 QT Interval:  527 QTC Calculation: 581 R Axis:   -64 Text Interpretation: Ectopic atrial rhythm Left bundle branch block since last tracing no significant change Confirmed by 01-26-2001 (Eber Hong) on 12/22/2019 3:00:34 PM    I have reviewed the labs performed to date as well as medications administered while in observation.  Recent changes in the last 24 hours include None. Plan  Current plan is for Transfer and admissino to psych facility. Patient is under full IVC at this time.  The pt has been accepted at [facility] medical center by Dr. [facility], Chevis Pretty, MD 12/24/19 1652    12/26/19, MD 12/24/19 1910

## 2019-12-24 NOTE — Progress Notes (Signed)
CSW spoke with admissions staff at [facility]. They continue to be on diversion. Disposition will continue to assist pt in finding inpatient psychiatric treatment.   Wells Guiles, LCSW, LCAS Disposition CSW [facility]/TTS [phone]
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
## 2019-12-24 NOTE — Progress Notes (Signed)
Still awaiting medical clearance note from EDP.   CSW called and spoke with RN to ask if she could push for the note. Patient has been accepted at [facility] pending medical clearance.   Drucilla Schmidt, MSW, LCSW-A Clinical Disposition Social Worker [employer] [facility]/TTS [phone]

## 2019-12-24 NOTE — ED Notes (Addendum)
Per [facility], pt is being considered for [facility]. Turner Daniels requesting repeat INR and note from EDP stating pt medically cleared pending INR.EDP reported would order INR and review pt chart.

## 2019-12-24 NOTE — ED Notes (Signed)
[facility] states pt continues to meet inpatient criteria after telepsych consultation.

## 2019-12-24 NOTE — Progress Notes (Signed)
Pt accepted to [facility] Unit; [id number].     Dr. Emily Filbert is the accepting and attending physician.   Call report to [phone].   Kaitlyn @ [facility] notified.     Pt is IVC.    Pt may be transported by [facility].   Patient must arrive and be in the building no later than 11:00PM. If it is after 11:00PM then the patient will have to wait until the next day and admit after 7:00AM.   Drucilla Schmidt, MSW, LCSW-A Clinical Disposition Social Worker [employer] [facility]/TTS [phone]

## 2019-12-24 NOTE — BH Assessment (Signed)
Clinician contacted pt via Tele-Assessment machine to determine if pt continues to meet inpatient hospitalization criteria. Pt shared he had difficulties sleeping last night until 0400 due to another pt in the ED yelling every 5 minutes and keeping him awake; he shares he has been eating well. Clinician inquired as to how he has been feeling since he got to the hospital. Pt stated he was "kinda scared at first because I didn't remember what had happened," though pt stated he has remembered parts of what happened since that time. P denied he is currently experiencing SI and denies he had ever experienced SI prior to two days ago. Pt states his best friend died just before Christmas, his wife died at Christmas, and another best friend died two weeks ago, which leaves him limited friends and supports. Pt states he was offered supports by hospice after the death of his wife but that he's "private" and that he and his wife did everything together, which has resulted in him having few things to do independently.   Pt states he has never had nor does he currently have a therapist nor a psychiatrist. He states he owns guns but that he told his son to take possession of them yesterday. Pt denies any hx or current AVH.  Marciano Sequin, NP, reviewed pt's chart and information and determined pt continues to meet criteria for inpatient hospitalization. This information was provided to pt's nurse, Luther Parody RN, at 1146.

## 2019-12-24 NOTE — ED Notes (Signed)
Attempted to update Luther Parody, [facility] SW, to notify that pt has been medically cleared per Dr. Hyacinth Meeker. No response by phone. Private messaged Luther Parody to notify of this, also.

## 2019-12-25 ENCOUNTER — Telehealth: Payer: Self-pay

## 2019-12-25 DIAGNOSIS — F10129 Alcohol abuse with intoxication, unspecified: Secondary | ICD-10-CM | POA: Insufficient documentation

## 2019-12-25 DIAGNOSIS — F4321 Adjustment disorder with depressed mood: Secondary | ICD-10-CM | POA: Insufficient documentation

## 2019-12-25 NOTE — Telephone Encounter (Signed)
Tiffany from [facility] called to verify how patient takes Warfarin. Pt currently takes 5mg . 1/2 tab Tues and Thur and a whole tab on the other days. Also confirmed that the last time he had labs drawn here was on 09/25/2019.

## 2020-01-01 ENCOUNTER — Telehealth: Payer: Self-pay | Admitting: Nurse Practitioner

## 2020-01-01 NOTE — Telephone Encounter (Signed)
Patient appt made for 04/30

## 2020-01-02 ENCOUNTER — Encounter: Payer: Self-pay | Admitting: Nurse Practitioner

## 2020-01-02 ENCOUNTER — Ambulatory Visit (INDEPENDENT_AMBULATORY_CARE_PROVIDER_SITE_OTHER): Payer: Medicare HMO | Admitting: Nurse Practitioner

## 2020-01-02 ENCOUNTER — Other Ambulatory Visit: Payer: Self-pay

## 2020-01-02 DIAGNOSIS — Z952 Presence of prosthetic heart valve: Secondary | ICD-10-CM | POA: Diagnosis not present

## 2020-01-02 DIAGNOSIS — I480 Paroxysmal atrial fibrillation: Secondary | ICD-10-CM | POA: Diagnosis not present

## 2020-01-02 LAB — COAGUCHEK XS/INR WAIVED
INR: 2 — ABNORMAL HIGH (ref 0.9–1.1)
Prothrombin Time: 23.6 s

## 2020-01-02 NOTE — Progress Notes (Signed)
Subjective:   Chief Complaint : INR recheck   Indication: atrial fibrillation Bleeding signs/symptoms: None Thromboembolic signs/symptoms: None  Missed Coumadin doses: None Medication changes: antidepressant was recently changed Dietary changes: no Bacterial/viral infection: no Other concerns: depression  * he has been very depressed since the death of his wife earlier this year. He wa admitted for in patient treatment for 72 hours due to suicidal thoughts. They started him on a new medication. He is notsure what he is on  and poharmacy has no record of any changes.  The following portions of the patient's history were reviewed and updated as appropriate: allergies, current medications, past family history, past medical history, past social history, past surgical history and problem list.  Review of [facility]/Psych: positive for depression   Objective:    INR Today: 2.0 Current dose coumadin5mg  daily except 2.5mg  on tues, thurs and sat    Assessment:    Subtherapeutic INR for goal of 2.5-3.5   Plan:    1. New dose: Coumadin 5mg  daily except 2.5mg  on tues and thurs   2. Next INR: 1 month    *patient will call and tell what his mds were changed to.  [name] Korea, FNP

## 2020-01-08 ENCOUNTER — Telehealth: Payer: Self-pay | Admitting: Nurse Practitioner

## 2020-01-08 DIAGNOSIS — I152 Hypertension secondary to endocrine disorders: Secondary | ICD-10-CM

## 2020-01-08 DIAGNOSIS — E1159 Type 2 diabetes mellitus with other circulatory complications: Secondary | ICD-10-CM

## 2020-01-08 DIAGNOSIS — E039 Hypothyroidism, unspecified: Secondary | ICD-10-CM

## 2020-01-08 NOTE — Telephone Encounter (Signed)
Patient saw you 04/30 for INR states he went to the [place] and was admitted in [place] and he thinks they did not give him all his meds he needs his Levothyroxin, Amlodipne, Lasix. He states he now needs [place] to send these in please advise if okay. He was not sure if they took him off or not. Please advise.

## 2020-01-09 MED ORDER — AMLODIPINE BESYLATE 5 MG PO TABS: 2.5000 mg | ORAL_TABLET | Freq: Every day | ORAL | 3 refills | Status: DC

## 2020-01-09 MED ORDER — LEVOTHYROXINE SODIUM 100 MCG PO TABS: 100.0000 ug | ORAL_TABLET | Freq: Every day | ORAL | 0 refills | Status: DC

## 2020-01-09 MED ORDER — FUROSEMIDE 40 MG PO TABS: 40.0000 mg | ORAL_TABLET | Freq: Every day | ORAL | 1 refills | Status: DC

## 2020-01-09 NOTE — Telephone Encounter (Signed)
All labs were filled

## 2020-01-09 NOTE — Telephone Encounter (Signed)
Pt made aware

## 2020-01-12 ENCOUNTER — Ambulatory Visit (INDEPENDENT_AMBULATORY_CARE_PROVIDER_SITE_OTHER): Payer: Medicare HMO | Admitting: *Deleted

## 2020-01-12 DIAGNOSIS — I442 Atrioventricular block, complete: Secondary | ICD-10-CM | POA: Diagnosis not present

## 2020-01-12 DIAGNOSIS — I5023 Acute on chronic systolic (congestive) heart failure: Secondary | ICD-10-CM

## 2020-01-12 LAB — CUP PACEART REMOTE DEVICE CHECK
Battery Remaining Longevity: 43 mo
Battery Remaining Percentage: 56 %
Battery Voltage: 2.89 V
Brady Statistic AP VP Percent: 89 %
Brady Statistic AP VS Percent: 1 %
Brady Statistic AS VP Percent: 10 %
Brady Statistic AS VS Percent: 1 %
Brady Statistic RA Percent Paced: 91 %
Brady Statistic RV Percent Paced: 99 %
Date Time Interrogation Session: 20210510020015
Implantable Lead Implant Date: 20100913
Implantable Lead Implant Date: 20150114
Implantable Lead Location: 753859
Implantable Lead Location: 753860
Implantable Pulse Generator Implant Date: 20150114
Lead Channel Impedance Value: 260 Ohm
Lead Channel Impedance Value: 450 Ohm
Lead Channel Pacing Threshold Amplitude: 0.75 V
Lead Channel Pacing Threshold Amplitude: 1 V
Lead Channel Pacing Threshold Pulse Width: 0.5 ms
Lead Channel Pacing Threshold Pulse Width: 0.8 ms
Lead Channel Sensing Intrinsic Amplitude: 2.4 mV
Lead Channel Sensing Intrinsic Amplitude: 6.3 mV
Lead Channel Setting Pacing Amplitude: 2.5 V
Lead Channel Setting Pacing Amplitude: 2.5 V
Lead Channel Setting Pacing Pulse Width: 0.5 ms
Lead Channel Setting Sensing Sensitivity: 4 mV
Pulse Gen Model: 2240
Pulse Gen Serial Number: 7586540

## 2020-01-12 NOTE — Progress Notes (Signed)
Remote pacemaker transmission.   

## 2020-01-23 ENCOUNTER — Telehealth: Payer: Self-pay | Admitting: Nurse Practitioner

## 2020-01-30 ENCOUNTER — Ambulatory Visit: Payer: Self-pay | Admitting: Nurse Practitioner

## 2020-02-01 DIAGNOSIS — R52 Pain, unspecified: Secondary | ICD-10-CM | POA: Diagnosis not present

## 2020-02-01 DIAGNOSIS — W19XXXA Unspecified fall, initial encounter: Secondary | ICD-10-CM | POA: Diagnosis not present

## 2020-02-01 DIAGNOSIS — R5381 Other malaise: Secondary | ICD-10-CM | POA: Diagnosis not present

## 2020-02-01 DIAGNOSIS — I10 Essential (primary) hypertension: Secondary | ICD-10-CM | POA: Diagnosis not present

## 2020-02-01 DIAGNOSIS — M25519 Pain in unspecified shoulder: Secondary | ICD-10-CM | POA: Diagnosis not present

## 2020-02-05 ENCOUNTER — Encounter: Payer: Self-pay | Admitting: Nurse Practitioner

## 2020-02-05 ENCOUNTER — Ambulatory Visit: Payer: Medicare HMO | Admitting: Nurse Practitioner

## 2020-02-20 ENCOUNTER — Other Ambulatory Visit: Payer: Self-pay

## 2020-02-20 ENCOUNTER — Emergency Department (HOSPITAL_COMMUNITY): Payer: Medicare HMO

## 2020-02-20 ENCOUNTER — Inpatient Hospital Stay (HOSPITAL_COMMUNITY)
Admission: AC | Admit: 2020-02-20 | Discharge: 2020-03-05 | DRG: 183 | Disposition: A | Discharge status: Skilled Nursing Facility | Payer: Medicare HMO | Attending: Surgery | Admitting: Surgery

## 2020-02-20 ENCOUNTER — Encounter (HOSPITAL_COMMUNITY): Payer: Self-pay

## 2020-02-20 DIAGNOSIS — Z950 Presence of cardiac pacemaker: Secondary | ICD-10-CM

## 2020-02-20 DIAGNOSIS — G4489 Other headache syndrome: Secondary | ICD-10-CM | POA: Diagnosis not present

## 2020-02-20 DIAGNOSIS — H548 Legal blindness, as defined in USA: Secondary | ICD-10-CM | POA: Diagnosis present

## 2020-02-20 DIAGNOSIS — S065X9A Traumatic subdural hemorrhage with loss of consciousness of unspecified duration, initial encounter: Secondary | ICD-10-CM | POA: Diagnosis not present

## 2020-02-20 DIAGNOSIS — E039 Hypothyroidism, unspecified: Secondary | ICD-10-CM | POA: Diagnosis present

## 2020-02-20 DIAGNOSIS — Z952 Presence of prosthetic heart valve: Secondary | ICD-10-CM

## 2020-02-20 DIAGNOSIS — S065XAA Traumatic subdural hemorrhage with loss of consciousness status unknown, initial encounter: Secondary | ICD-10-CM | POA: Diagnosis present

## 2020-02-20 DIAGNOSIS — Z7401 Bed confinement status: Secondary | ICD-10-CM | POA: Diagnosis not present

## 2020-02-20 DIAGNOSIS — Y939 Activity, unspecified: Secondary | ICD-10-CM

## 2020-02-20 DIAGNOSIS — M6281 Muscle weakness (generalized): Secondary | ICD-10-CM | POA: Diagnosis not present

## 2020-02-20 DIAGNOSIS — S065X0A Traumatic subdural hemorrhage without loss of consciousness, initial encounter: Secondary | ICD-10-CM | POA: Diagnosis not present

## 2020-02-20 DIAGNOSIS — R0781 Pleurodynia: Secondary | ICD-10-CM | POA: Diagnosis not present

## 2020-02-20 DIAGNOSIS — G2581 Restless legs syndrome: Secondary | ICD-10-CM | POA: Diagnosis present

## 2020-02-20 DIAGNOSIS — Z888 Allergy status to other drugs, medicaments and biological substances status: Secondary | ICD-10-CM | POA: Diagnosis not present

## 2020-02-20 DIAGNOSIS — W19XXXA Unspecified fall, initial encounter: Secondary | ICD-10-CM | POA: Diagnosis not present

## 2020-02-20 DIAGNOSIS — R5381 Other malaise: Secondary | ICD-10-CM | POA: Diagnosis not present

## 2020-02-20 DIAGNOSIS — S2242XA Multiple fractures of ribs, left side, initial encounter for closed fracture: Secondary | ICD-10-CM

## 2020-02-20 DIAGNOSIS — E1122 Type 2 diabetes mellitus with diabetic chronic kidney disease: Secondary | ICD-10-CM | POA: Diagnosis present

## 2020-02-20 DIAGNOSIS — I2510 Atherosclerotic heart disease of native coronary artery without angina pectoris: Secondary | ICD-10-CM | POA: Diagnosis not present

## 2020-02-20 DIAGNOSIS — I130 Hypertensive heart and chronic kidney disease with heart failure and stage 1 through stage 4 chronic kidney disease, or unspecified chronic kidney disease: Secondary | ICD-10-CM | POA: Diagnosis not present

## 2020-02-20 DIAGNOSIS — E785 Hyperlipidemia, unspecified: Secondary | ICD-10-CM | POA: Diagnosis present

## 2020-02-20 DIAGNOSIS — E1140 Type 2 diabetes mellitus with diabetic neuropathy, unspecified: Secondary | ICD-10-CM | POA: Diagnosis present

## 2020-02-20 DIAGNOSIS — Z811 Family history of alcohol abuse and dependence: Secondary | ICD-10-CM

## 2020-02-20 DIAGNOSIS — S2242XD Multiple fractures of ribs, left side, subsequent encounter for fracture with routine healing: Secondary | ICD-10-CM | POA: Diagnosis not present

## 2020-02-20 DIAGNOSIS — S0181XA Laceration without foreign body of other part of head, initial encounter: Secondary | ICD-10-CM | POA: Diagnosis present

## 2020-02-20 DIAGNOSIS — I152 Hypertension secondary to endocrine disorders: Secondary | ICD-10-CM

## 2020-02-20 DIAGNOSIS — Z20822 Contact with and (suspected) exposure to covid-19: Secondary | ICD-10-CM | POA: Diagnosis not present

## 2020-02-20 DIAGNOSIS — I4891 Unspecified atrial fibrillation: Secondary | ICD-10-CM | POA: Diagnosis not present

## 2020-02-20 DIAGNOSIS — M2550 Pain in unspecified joint: Secondary | ICD-10-CM | POA: Diagnosis not present

## 2020-02-20 DIAGNOSIS — D62 Acute posthemorrhagic anemia: Secondary | ICD-10-CM | POA: Diagnosis not present

## 2020-02-20 DIAGNOSIS — F419 Anxiety disorder, unspecified: Secondary | ICD-10-CM | POA: Diagnosis present

## 2020-02-20 DIAGNOSIS — S0101XA Laceration without foreign body of scalp, initial encounter: Secondary | ICD-10-CM | POA: Diagnosis present

## 2020-02-20 DIAGNOSIS — R0902 Hypoxemia: Secondary | ICD-10-CM | POA: Diagnosis not present

## 2020-02-20 DIAGNOSIS — I700 Atherosclerosis of aorta: Secondary | ICD-10-CM | POA: Diagnosis not present

## 2020-02-20 DIAGNOSIS — Z886 Allergy status to analgesic agent status: Secondary | ICD-10-CM

## 2020-02-20 DIAGNOSIS — Z96642 Presence of left artificial hip joint: Secondary | ICD-10-CM | POA: Diagnosis present

## 2020-02-20 DIAGNOSIS — N179 Acute kidney failure, unspecified: Secondary | ICD-10-CM | POA: Diagnosis not present

## 2020-02-20 DIAGNOSIS — Y92009 Unspecified place in unspecified non-institutional (private) residence as the place of occurrence of the external cause: Secondary | ICD-10-CM | POA: Diagnosis not present

## 2020-02-20 DIAGNOSIS — N183 Chronic kidney disease, stage 3 unspecified: Secondary | ICD-10-CM | POA: Diagnosis present

## 2020-02-20 DIAGNOSIS — E119 Type 2 diabetes mellitus without complications: Secondary | ICD-10-CM | POA: Diagnosis not present

## 2020-02-20 DIAGNOSIS — M25512 Pain in left shoulder: Secondary | ICD-10-CM | POA: Diagnosis not present

## 2020-02-20 DIAGNOSIS — S199XXA Unspecified injury of neck, initial encounter: Secondary | ICD-10-CM | POA: Diagnosis not present

## 2020-02-20 DIAGNOSIS — I5022 Chronic systolic (congestive) heart failure: Secondary | ICD-10-CM | POA: Diagnosis present

## 2020-02-20 DIAGNOSIS — S80212A Abrasion, left knee, initial encounter: Secondary | ICD-10-CM | POA: Diagnosis present

## 2020-02-20 DIAGNOSIS — Z8249 Family history of ischemic heart disease and other diseases of the circulatory system: Secondary | ICD-10-CM | POA: Diagnosis not present

## 2020-02-20 DIAGNOSIS — Z79891 Long term (current) use of opiate analgesic: Secondary | ICD-10-CM

## 2020-02-20 DIAGNOSIS — F329 Major depressive disorder, single episode, unspecified: Secondary | ICD-10-CM | POA: Diagnosis present

## 2020-02-20 DIAGNOSIS — W109XXA Fall (on) (from) unspecified stairs and steps, initial encounter: Secondary | ICD-10-CM | POA: Diagnosis not present

## 2020-02-20 DIAGNOSIS — Z7901 Long term (current) use of anticoagulants: Secondary | ICD-10-CM | POA: Diagnosis not present

## 2020-02-20 DIAGNOSIS — I10 Essential (primary) hypertension: Secondary | ICD-10-CM | POA: Diagnosis not present

## 2020-02-20 DIAGNOSIS — R278 Other lack of coordination: Secondary | ICD-10-CM | POA: Diagnosis not present

## 2020-02-20 DIAGNOSIS — Z0389 Encounter for observation for other suspected diseases and conditions ruled out: Secondary | ICD-10-CM | POA: Diagnosis not present

## 2020-02-20 DIAGNOSIS — M542 Cervicalgia: Secondary | ICD-10-CM | POA: Diagnosis present

## 2020-02-20 DIAGNOSIS — G894 Chronic pain syndrome: Secondary | ICD-10-CM | POA: Diagnosis present

## 2020-02-20 DIAGNOSIS — I712 Thoracic aortic aneurysm, without rupture: Secondary | ICD-10-CM | POA: Diagnosis present

## 2020-02-20 DIAGNOSIS — Z823 Family history of stroke: Secondary | ICD-10-CM

## 2020-02-20 DIAGNOSIS — R52 Pain, unspecified: Secondary | ICD-10-CM | POA: Diagnosis not present

## 2020-02-20 DIAGNOSIS — S80211A Abrasion, right knee, initial encounter: Secondary | ICD-10-CM | POA: Diagnosis present

## 2020-02-20 DIAGNOSIS — R519 Headache, unspecified: Secondary | ICD-10-CM | POA: Diagnosis not present

## 2020-02-20 DIAGNOSIS — M79602 Pain in left arm: Secondary | ICD-10-CM | POA: Diagnosis present

## 2020-02-20 LAB — COMPREHENSIVE METABOLIC PANEL
ALT: 24 U/L (ref 0–44)
AST: 50 U/L — ABNORMAL HIGH (ref 15–41)
Albumin: 4 g/dL (ref 3.5–5.0)
Alkaline Phosphatase: 66 U/L (ref 38–126)
Anion gap: 11 (ref 5–15)
BUN: 20 mg/dL (ref 8–23)
CO2: 22 mmol/L (ref 22–32)
Calcium: 8.7 mg/dL — ABNORMAL LOW (ref 8.9–10.3)
Chloride: 104 mmol/L (ref 98–111)
Creatinine, Ser: 1.17 mg/dL (ref 0.61–1.24)
GFR calc Af Amer: >60 mL/min (ref >60)
GFR calc non Af Amer: >60 mL/min (ref >60)
Glucose, Bld: 177 mg/dL — ABNORMAL HIGH (ref 70–99)
Potassium: 3.6 mmol/L (ref 3.5–5.1)
Sodium: 137 mmol/L (ref 135–145)
Total Bilirubin: 0.8 mg/dL (ref 0.3–1.2)
Total Protein: 7.8 g/dL (ref 6.5–8.1)

## 2020-02-20 LAB — CBC WITH DIFFERENTIAL/PLATELET
Abs Immature Granulocytes: 0.12 10*3/uL — ABNORMAL HIGH (ref 0.00–0.07)
Basophils Absolute: 0.1 10*3/uL (ref 0.0–0.1)
Basophils Relative: 1 %
Eosinophils Absolute: 0.2 10*3/uL (ref 0.0–0.5)
Eosinophils Relative: 1 %
HCT: 41.3 % (ref 39.0–52.0)
Hemoglobin: 13.7 g/dL (ref 13.0–17.0)
Immature Granulocytes: 1 %
Lymphocytes Relative: 11 %
Lymphs Abs: 1.3 10*3/uL (ref 0.7–4.0)
MCH: 30.4 pg (ref 26.0–34.0)
MCHC: 33.2 g/dL (ref 30.0–36.0)
MCV: 91.6 fL (ref 80.0–100.0)
Monocytes Absolute: 1.2 10*3/uL — ABNORMAL HIGH (ref 0.1–1.0)
Monocytes Relative: 10 %
Neutro Abs: 8.9 10*3/uL — ABNORMAL HIGH (ref 1.7–7.7)
Neutrophils Relative %: 76 %
Platelets: 217 10*3/uL (ref 150–400)
RBC: 4.51 MIL/uL (ref 4.22–5.81)
RDW: 14.7 % (ref 11.5–15.5)
WBC: 11.8 10*3/uL — ABNORMAL HIGH (ref 4.0–10.5)
nRBC: 0 % (ref 0.0–0.2)

## 2020-02-20 LAB — I-STAT CHEM 8, ED
BUN: 20 mg/dL (ref 8–23)
Calcium, Ion: 1.18 mmol/L (ref 1.15–1.40)
Chloride: 107 mmol/L (ref 98–111)
Creatinine, Ser: 1.4 mg/dL — ABNORMAL HIGH (ref 0.61–1.24)
Glucose, Bld: 180 mg/dL — ABNORMAL HIGH (ref 70–99)
HCT: 42 % (ref 39.0–52.0)
Hemoglobin: 14.3 g/dL (ref 13.0–17.0)
Potassium: 3.7 mmol/L (ref 3.5–5.1)
Sodium: 142 mmol/L (ref 135–145)
TCO2: 23 mmol/L (ref 22–32)

## 2020-02-20 LAB — PROTIME-INR
INR: 2 — ABNORMAL HIGH (ref 0.8–1.2)
INR: 1.3 — ABNORMAL HIGH (ref 0.8–1.2)
Prothrombin Time: 21.7 seconds — ABNORMAL HIGH (ref 11.4–15.2)
Prothrombin Time: 15.7 s — ABNORMAL HIGH (ref 11.4–15.2)

## 2020-02-20 LAB — SARS CORONAVIRUS 2 BY RT PCR (HOSPITAL ORDER, PERFORMED IN ~~LOC~~ HOSPITAL LAB): SARS Coronavirus 2: NEGATIVE

## 2020-02-20 LAB — HIV ANTIBODY (ROUTINE TESTING W REFLEX): HIV Screen 4th Generation wRfx: NONREACTIVE

## 2020-02-20 MED ORDER — BACITRACIN ZINC 500 UNIT/GM EX OINT
TOPICAL_OINTMENT | Freq: Once | CUTANEOUS | Status: AC
  Administered 2020-02-20: 1 via TOPICAL
  Filled 2020-02-20: qty 0.9

## 2020-02-20 MED ORDER — HYDROMORPHONE HCL 1 MG/ML IJ SOLN
1.0000 mg | INTRAMUSCULAR | Status: DC | PRN
  Administered 2020-02-20 – 2020-02-23 (×10): 1 mg via INTRAVENOUS
  Filled 2020-02-20 (×10): qty 1

## 2020-02-20 MED ORDER — HYDROMORPHONE HCL 1 MG/ML IJ SOLN
1.0000 mg | Freq: Once | INTRAMUSCULAR | Status: AC
  Administered 2020-02-20: 1 mg via INTRAVENOUS
  Filled 2020-02-20: qty 1

## 2020-02-20 MED ORDER — ONDANSETRON HCL 4 MG/2ML IJ SOLN
4.0000 mg | Freq: Four times a day (QID) | INTRAMUSCULAR | Status: DC | PRN
  Administered 2020-03-03: 4 mg via INTRAVENOUS
  Filled 2020-02-20: qty 2

## 2020-02-20 MED ORDER — ONDANSETRON HCL 4 MG/2ML IJ SOLN
4.0000 mg | Freq: Once | INTRAMUSCULAR | Status: AC
  Administered 2020-02-20: 4 mg via INTRAVENOUS
  Filled 2020-02-20: qty 2

## 2020-02-20 MED ORDER — DOCUSATE SODIUM 100 MG PO CAPS
100.0000 mg | ORAL_CAPSULE | Freq: Two times a day (BID) | ORAL | Status: DC
  Administered 2020-02-24 – 2020-03-05 (×9): 100 mg via ORAL
  Filled 2020-02-20 (×24): qty 1

## 2020-02-20 MED ORDER — ACETAMINOPHEN 325 MG PO TABS: 650.0000 mg | ORAL_TABLET | ORAL | Status: DC | PRN

## 2020-02-20 MED ORDER — PROTHROMBIN COMPLEX CONC HUMAN 500 UNITS IV KIT
1500.0000 [IU] | PACK | Status: AC
  Administered 2020-02-20: 1500 [IU] via INTRAVENOUS
  Filled 2020-02-20: qty 1500

## 2020-02-20 MED ORDER — VITAMIN K1 10 MG/ML IJ SOLN
INTRAMUSCULAR | Status: AC
  Filled 2020-02-20: qty 1

## 2020-02-20 MED ORDER — LIDOCAINE HCL (PF) 1 % IJ SOLN
10.0000 mL | Freq: Once | INTRAMUSCULAR | Status: AC
  Administered 2020-02-20: 10 mL via INTRADERMAL
  Filled 2020-02-20: qty 10

## 2020-02-20 MED ORDER — SODIUM CHLORIDE 0.9 % IV SOLN: INTRAVENOUS | Status: DC

## 2020-02-20 MED ORDER — SODIUM CHLORIDE 0.9 % IV BOLUS
500.0000 mL | Freq: Once | INTRAVENOUS | Status: AC
  Administered 2020-02-20: 500 mL via INTRAVENOUS

## 2020-02-20 MED ORDER — ONDANSETRON 4 MG PO TBDP: 4.0000 mg | ORAL_TABLET | Freq: Four times a day (QID) | ORAL | Status: DC | PRN

## 2020-02-20 MED ORDER — HYDROMORPHONE HCL 1 MG/ML IJ SOLN: 1.0000 mg | Freq: Once | INTRAMUSCULAR | Status: DC

## 2020-02-20 MED ORDER — OXYCODONE HCL 5 MG PO TABS
15.0000 mg | ORAL_TABLET | ORAL | Status: DC | PRN
  Administered 2020-02-21 – 2020-02-22 (×6): 15 mg via ORAL
  Filled 2020-02-20 (×6): qty 3

## 2020-02-20 MED ORDER — DEXTROSE-NACL 5-0.45 % IV SOLN: INTRAVENOUS | Status: DC

## 2020-02-20 MED ORDER — IOHEXOL 300 MG/ML  SOLN
100.0000 mL | Freq: Once | INTRAMUSCULAR | Status: AC | PRN
  Administered 2020-02-20: 100 mL via INTRAVENOUS

## 2020-02-20 MED ORDER — VITAMIN K1 10 MG/ML IJ SOLN
10.0000 mg | INTRAVENOUS | Status: AC
  Administered 2020-02-20: 10 mg via INTRAVENOUS
  Filled 2020-02-20: qty 1

## 2020-02-20 NOTE — ED Triage Notes (Signed)
EMS reports pt tripped and fell down 2 concrete steps onto a concrete sidewalk.  Has abrasion over left eye, left arm, both knees, and left rib pain.  Pt unsure if he lost consciousness or not.
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
## 2020-02-20 NOTE — ED Provider Notes (Signed)
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 -- --  02/20/20 1903 -- -- -- 60 13 93 % -- --  02/20/20 1902 -- -- -- (!) 59 18 94 % -- --  02/20/20 1901 -- -- -- 63 15 95 % -- --  02/20/20 1900 -- -- -- 66 15 94 % -- --  02/20/20 1859 -- -- -- 64 20 92 % -- --  02/20/20 1858 -- -- -- (!) 59 16 92 % -- --  02/20/20 1857 -- -- -- (!) 59 20 92 % -- --  02/20/20 1856 -- -- -- 60 (!) 35 93 % -- --  02/20/20 1855 -- -- -- -- (!) 24 -- -- --  02/20/20 1854 -- -- -- -- 19 -- -- --  02/20/20 1853 -- -- -- -- 18 -- -- --  02/20/20 1852 -- -- -- -- (!) 21 -- -- --  02/20/20 1851 -- -- -- 63 18 91 % -- --  02/20/20 1850 -- -- -- 64 11 90 % -- --  02/20/20 1849 -- -- -- 62 12 90 % -- --  02/20/20 1848 -- -- -- 66 15 (!) 89 % -- --  02/20/20 1847 -- -- -- 60 18 (!) 89 % -- --  02/20/20 1846 -- -- -- 66 17 92 % -- --  02/20/20 1845 -- -- -- (!) 59 15 (!) 88 % -- --  02/20/20 1844 -- -- -- 61 15 (!) 89 % -- --  02/20/20 1843 -- -- -- 66 (!) 23 91 % -- --  02/20/20 1842 -- -- -- 61 18 90 % -- --   02/20/20 1841 -- -- -- 63 (!) 21 (!) 86 % -- --    02/20/20 1840 -- -- -- 66 16 (!) 88 % -- --  02/20/20 1839 -- -- -- 66 (!) 24 (!) 88 % -- --  02/20/20 1838 -- -- -- 66 (!) 27 (!) 87 % -- --  02/20/20 1837 -- -- -- 60 20 (!) 88 % -- --  02/20/20 1836 -- -- -- 61 (!) 24 (!) 88 % -- --  02/20/20 1835 -- -- -- 66 (!) 24 (!) 89 % -- --  02/20/20 1834 -- -- -- 66 19 91 % -- --  02/20/20 1833 -- -- -- 63 (!) 24 (!) 89 % -- --  02/20/20 1832 -- -- -- 62 (!) 22 (!) 89 % -- --  02/20/20 1831 -- -- -- 60 20 90 % -- --  02/20/20 1830 -- -- -- 60 18 91 % -- --  02/20/20 1829 -- -- -- 66 15 90 % -- --  02/20/20 1828 -- -- -- 61 (!) 22 (!) 87 % -- --  02/20/20 1827 -- -- -- 66 (!) 24 (!) 89 % -- --  02/20/20 1826 -- -- -- 63 (!) 30 (!) 88 % -- --  02/20/20 1825 -- -- -- 60 (!) 34 90 % -- --  02/20/20 1824 -- -- -- 60 (!) 28 92 % -- --  02/20/20 1823 -- -- -- 60 (!) 26 92 % -- --  02/20/20 1822 -- -- -- 60 (!) 22 91 % -- --  02/20/20 1821 -- -- -- 65 (!) 38 92 % -- --  02/20/20 1820 -- -- -- (!) 59 15 92 % -- --  02/20/20 1819 -- -- -- 60 20 92 % -- --  02/20/20 1818 -- -- -- (!) 59 18 93 % -- --  02/20/20 1817 -- -- -- (!) 59 15 94 % -- --  02/20/20 1816 -- -- -- 65 14 94 % -- --  02/20/20 1815 -- -- -- 65 13 95 % -- --  02/20/20 1814 -- -- -- 63 18 93 % -- --  02/20/20 1813 -- -- -- 63 15 95 % -- --  02/20/20 1812 -- -- -- -- 15 -- -- --  02/20/20 1811 -- -- -- -- 15 -- -- --  02/20/20 1810 -- -- -- 64 18 95 % -- --  02/20/20 1809 -- -- -- 65 (!) 21 91 % -- --  02/20/20 1808 -- -- -- 65 (!) 31 93 % -- --  02/20/20 1807 -- -- -- 63 (!) 27 93 % -- --  02/20/20 1806 -- -- -- 66 (!) 28 91 % -- --  02/20/20 1805 -- -- -- 63 (!) 23 95 % -- --  02/20/20 1804 -- -- -- 63 (!) 21 (!) 89 % -- --  02/20/20 1803 -- -- -- 61 (!) 23 (!) 88 % -- --  02/20/20 1802 -- -- -- 63 (!) 24 (!) 86 % -- --  02/20/20 1801 -- -- -- 66 (!) 32 (!) 88 % -- --  02/20/20 1800 -- -- -- 66 (!) 28 (!) 88 % -- --   02/20/20 1759 -- -- -- 66 13 91 % -- --  02/20/20 1758 -- -- -- (!) 59 17 92 % -- --  02/20/20 1757 -- -- -- 63 -- 91 % -- --  02/20/20 1750 -- -- -- -- (!) 25 -- -- --  02/20/20 1749 -- -- -- (!) 59 (!) 27 (!) 88 % -- --  02/20/20 1748 -- -- --   60 19 92 % -- --  02/20/20 1747 -- -- -- 60 (!) 24 91 % -- --  02/20/20 1746 -- -- -- 62 (!) 32 91 % -- --  02/20/20 1745 -- -- -- 62 (!) 29 91 % -- --  02/20/20 1744 -- -- -- 65 20 (!) 88 % -- --  02/20/20 1743 -- -- -- 60 (!) 22 (!) 87 % -- --  02/20/20 1742 -- -- -- 63 (!) 24 (!) 87 % -- --  02/20/20 1741 -- -- -- 65 (!) 28 (!) 88 % -- --  02/20/20 1740 -- -- -- 65 (!) 26 92 % -- --  02/20/20 1739 -- -- -- 60 16 90 % -- --  02/20/20 1738 -- -- -- 60 (!) 23 90 % -- --  02/20/20 1737 -- -- -- 60 (!) 26 (!) 89 % -- --  02/20/20 1736 -- -- -- 60 (!) 34 (!) 89 % -- --  02/20/20 1735 -- -- -- 62 (!) 33 (!) 88 % -- --  02/20/20 1734 -- -- -- 60 (!) 24 90 % -- --  02/20/20 1733 -- -- -- (!) 59 (!) 30 90 % -- --  02/20/20 1732 -- -- -- 63 (!) 25 90 % -- --  02/20/20 1731 -- -- -- (!) 58 (!) 27 (!) 87 % -- --  02/20/20 1730 137/66 -- -- 60 (!) 28 90 % -- --  02/20/20 1729 -- -- -- (!) 59 (!) 35 (!) 89 % -- --  02/20/20 1728 -- -- -- (!) 59 (!) 21 95 % -- --  02/20/20 1727 -- -- -- (!) 59 15 94 % -- --  02/20/20 1726 -- -- -- 64 (!) 23 93 % -- --  02/20/20 1725 -- -- -- 63 (!) 26 94 % -- --  02/20/20 1724 -- -- -- 64 (!) 23 94 % -- --  02/20/20 1723 -- -- -- 64 (!) 25 91 % -- --  02/20/20 1722 -- -- -- 63 (!) 30 91 % -- --  02/20/20 1721 -- -- -- (!) 59 (!) 29 93 % -- --  02/20/20 1720 -- -- -- 62 20 93 % -- --  02/20/20 1719 -- -- -- (!) 59 (!) 22 93 % -- --  02/20/20 1718 -- -- -- 64 (!) 23 93 % -- --  02/20/20 1717 -- -- -- 60 (!) 27 91 % -- --  02/20/20 1716 -- -- -- 60 (!) 27 91 % -- --  02/20/20 1715 -- -- -- 60 (!) 33 92 % -- --  02/20/20 1714 -- -- -- 60 (!) 38 93 % -- --  02/20/20 1713 -- -- -- (!) 59 (!) 29 90 % -- --  02/20/20  1712 -- -- -- 60 16 95 % -- --  02/20/20 1711 -- -- -- 60 16 96 % -- --  02/20/20 1710 -- -- -- 60 19 95 % -- --  02/20/20 1709 -- -- -- 66 18 95 % -- --  02/20/20 1708 -- -- -- 62 13 95 % -- --  02/20/20 1707 -- -- -- 61 12 94 % -- --  02/20/20 1706 -- -- -- 63 16 94 % -- --  02/20/20 1705 -- -- -- (!) 59 17 95 % -- --  02/20/20 1704 -- -- -- 60 16 94 % -- --  02/20/20 1703 -- -- -- 61 15 95 % -- --  02/20/20 1702 -- -- --   63 15 95 % -- --  02/20/20 1701 -- -- -- 61 13 92 % -- --  02/20/20 1700 (!) 141/69 -- -- (!) 59 13 92 % -- --  02/20/20 1659 -- -- -- 61 20 95 % -- --  02/20/20 1658 -- -- -- 65 14 94 % -- --  02/20/20 1657 -- -- -- 62 13 95 % -- --  02/20/20 1656 -- -- -- 65 10 94 % -- --  02/20/20 1655 -- -- -- 63 13 95 % -- --  02/20/20 1654 -- -- -- (!) 59 12 95 % -- --  02/20/20 1653 -- -- -- 62 20 96 % -- --  02/20/20 1652 -- -- -- (!) 59 13 98 % -- --  02/20/20 1651 -- -- -- 63 17 97 % -- --  02/20/20 1650 -- -- -- 60 13 100 % -- --  02/20/20 1649 -- -- -- 64 20 95 % -- --  02/20/20 1648 -- -- -- 63 16 97 % -- --  02/20/20 1647 -- -- -- 63 -- 96 % -- --  02/20/20 1646 -- -- -- 67 -- 96 % -- --  02/20/20 1645 (!) 155/91 -- -- 66 -- 96 % -- --  02/20/20 1638 134/68 -- -- -- -- -- -- --  02/20/20 1634 -- -- -- 81 16 96 % -- --  02/20/20 1603 -- 98.5 F (36.9 C) Oral 62 18 95 % -- --  02/20/20 1601 -- -- -- -- -- -- 5\' 6"  (1.676 m) 89.8 kg    7: 45 PM Reevaluation with update and discussion. After initial assessment and treatment, an updated evaluation reveals he remains alert and still complains of left chest pain.  No change in mental status at this time.  Patient updated on findings and plan. Mancel BaleElliott Keauna Brasel   Medical Decision Making:  This patient is presenting for evaluation of injuries from fall, which does require a range of treatment options, and is a complaint that involves a high risk of morbidity and mortality. The differential diagnoses include intracranial  injury, rib fractures, spine injury. I decided to review old records, and in summary elderly male, with accidental fall, while going down some steps.  No specific prodrome..  I did not require additional historical information from anyone.  Clinical Laboratory Tests Ordered, included CBC, Metabolic panel and INR. Review indicates therapeutic elevated INR, elevated creatinine, increased glucose, increased WBC, decreased calcium. Radiologic Tests Ordered, included CT head, CT cervical spine, CT chest.  I independently Visualized: CT images, which show subdural, acute on chronic, multiple left rib fractures.  No intra-abdominal or pelvis injuries  Cardiac Monitor Tracing which shows normal sinus rhythm    Critical Interventions-clinical evaluation, laboratory testing, CT imaging, observation, treatment with narcotics, reevaluation  After These Interventions, the Patient was reevaluated and was found with acute intracranial injury, subdural, anticoagulated requiring reversal to maintain normal mental status.  Discussed with pharmacy, Kcentra and vitamin K ordered.  [facility] and multiple rib fractures.  He will see the patient in the ED.  Case discussed with neurosurgery, who will also evaluate the patient for possible intervention.  Patient acutely ill requiring urgent transfer, will be sent by EMS.  CRITICAL CARE-yes Performed by: Mancel BaleElliott Lile Mccurley  Nursing Notes Reviewed/ Care Coordinated Applicable Imaging Reviewed Interpretation of Laboratory Data incorporated into ED treatment   [facility]    Final Clinical  Impression(s) / ED Diagnoses Final diagnoses:  Fall, initial encounter  Subdural hematoma (HCC)  Closed fracture of multiple ribs of left side, initial encounter    Rx / DC Orders ED Discharge Orders    None       Mancel Bale, MD 02/20/20 2041

## 2020-02-20 NOTE — ED Notes (Signed)
Dr. Francena Hanly paged to make aware pt arrived in ED per RN [facility] request

## 2020-02-20 NOTE — ED Notes (Signed)
Pt arrived from [facility] via [facility]. Vitamin K infusion completed en route, kcentra still infusing. Pt alert to baseline at this time.
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
## 2020-02-20 NOTE — Progress Notes (Signed)
Patient ID: Andre Malone, male   DOB: 1951-04-05, 69 y.o.   MRN: [id number] BP 132/63   Pulse 60   Temp 98.2 F (36.8 C) (Oral)   Resp 12   Ht 5\' 6"  (1.676 m)   Wt 89.8 kg   SpO2 90%   BMI 31.96 kg/m  Films reviewed. Needs ct of the cervical spine. Head ct shows a tiny subdural hematoma left parietal region. Will need repeat due to coumadin history. Agree with coumadin reversal.

## 2020-02-20 NOTE — ED Provider Notes (Signed)
69 year old male brought in from [facility] for evaluation of subdural hematoma after a mechanical fall.  He was walking down some steps and states that he tripped and fell.  He is on Coumadin.  At [facility], he was evaluated and found to have a 5 mm subdural hematoma with 2 mm shift.  He was given vitamin K and Kcentra.  Case was discussed with trauma surgery who accepted transfer over.  Case was discussed with Dr. Franky Macho (neurosurgery) who would see the patient in consultation once he got at [facility].  Physical Exam  BP 132/63   Pulse 60   Temp 98.2 F (36.8 C) (Oral)   Resp 12   Ht 5\' 6"  (1.676 m)   Wt 89.8 kg   SpO2 90%   BMI 31.96 kg/m   Physical Exam   Neuro: Cranial nerves III-XII intact Follows commands, Moves all extremities  5/5 strength to BUE and BLE  Sensation intact throughout all major nerve distributions Normal finger to nose. No slurred speech. No facial droop.   Skin: 2 cm Y shaped laceration noted to the left lateral forehead.   ED Course/Procedures   Clinical Course as of Feb 20 2220  Fri Feb 20, 2020  1918 Case discussed with trauma surgery, Dr. 1919, accepts patient to [facility] emergency department for further management for traumatic injuries.   [EW]  1918 Kcentra and vitamin K has been ordered, pharmacy will manage dosing.   [EW]  1918 Case discussed with Dr. 1919, from neurosurgery who will see the patient as a consultant after he arrives at [facility] emergency department   [EW]  2036 CBC with Differential(!) [EW]    Clinical Course User Index [EW] 2037, MD    ..Laceration Repair  Date/Time: 02/20/2020 9:50 PM Performed by: 02/22/2020, PA-C Authorized by: Maxwell Caul, PA-C   Consent:    Consent obtained:  Verbal   Consent given by:  Patient   Risks discussed:  Infection, need for additional repair, pain, poor cosmetic result and poor wound healing   Alternatives discussed:  No treatment and delayed  treatment Universal protocol:    Procedure explained and questions answered to patient or proxy's satisfaction: yes     Relevant documents present and verified: yes     Test results available and properly labeled: yes     Imaging studies available: yes     Required blood products, implants, devices, and special equipment available: yes     Site/side marked: yes     Immediately prior to procedure, a time out was called: yes     Patient identity confirmed:  Verbally with patient Anesthesia (see MAR for exact dosages):    Anesthesia method:  Local infiltration   Local anesthetic:  Lidocaine 1% w/o epi Laceration details:    Location:  Scalp   Scalp location:  L temporal   Length (cm):  2 Repair type:    Repair type:  Intermediate Pre-procedure details:    Preparation:  Imaging obtained to evaluate for foreign bodies Exploration:    Wound extent: no foreign bodies/material noted   Treatment:    Area cleansed with:  Betadine   Amount of cleaning:  Extensive   Irrigation solution:  Sterile saline   Irrigation method:  Syringe   Visualized foreign bodies/material removed: no   Skin repair:    Repair method:  Sutures   Suture size:  6-0   Suture material:  Prolene   Suture technique:  Simple interrupted  Number of sutures:  6 Approximation:    Approximation:  Close Post-procedure details:    Dressing:  Antibiotic ointment   Patient tolerance of procedure:  Tolerated well, no immediate complications Comments:     Once the wound was anesthetized, was irrigated with sterile saline.  There was a large amount of dried blood surrounding the area but no evidence of foreign body.  The laceration was more of a Y-shaped laceration.  He had a slight skin avulsion towards the distal aspect.  The white edge was brought to the linear part and then the area was repaired.    MDM   9:13 PM: Dr. Kieth Brightly at bedside. Will plan for admission.   9:25 PM: Discussed with Dr. Christella Noa (Neurosurg)  to notify patient is in the ED.      ICD-10-CM   1. Fall, initial encounter  W19.XXXA   2. Subdural hematoma ([place])  S06.5X9A   3. Closed fracture of multiple ribs of left side, initial encounter  S22.42XA      Portions of this note were generated with Dragon dictation software. Dictation errors may occur despite best attempts at proofreading.    Volanda Napoleon, PA-C 02/20/20 2221    Veryl Speak, MD 02/20/20 2232

## 2020-02-21 ENCOUNTER — Inpatient Hospital Stay (HOSPITAL_COMMUNITY): Payer: Medicare HMO

## 2020-02-21 LAB — BASIC METABOLIC PANEL
Anion gap: 10 (ref 5–15)
BUN: 15 mg/dL (ref 8–23)
CO2: 23 mmol/L (ref 22–32)
Calcium: 8.6 mg/dL — ABNORMAL LOW (ref 8.9–10.3)
Chloride: 106 mmol/L (ref 98–111)
Creatinine, Ser: 1.14 mg/dL (ref 0.61–1.24)
GFR calc Af Amer: >60 mL/min (ref >60)
GFR calc non Af Amer: >60 mL/min (ref >60)
Glucose, Bld: 169 mg/dL — ABNORMAL HIGH (ref 70–99)
Potassium: 3.7 mmol/L (ref 3.5–5.1)
Sodium: 139 mmol/L (ref 135–145)

## 2020-02-21 LAB — CBC
HCT: 42.1 % (ref 39.0–52.0)
Hemoglobin: 13.8 g/dL (ref 13.0–17.0)
MCH: 30.1 pg (ref 26.0–34.0)
MCHC: 32.8 g/dL (ref 30.0–36.0)
MCV: 91.7 fL (ref 80.0–100.0)
Platelets: 199 10*3/uL (ref 150–400)
RBC: 4.59 MIL/uL (ref 4.22–5.81)
RDW: 14.8 % (ref 11.5–15.5)
WBC: 11.1 10*3/uL — ABNORMAL HIGH (ref 4.0–10.5)
nRBC: 0 % (ref 0.0–0.2)

## 2020-02-21 MED ORDER — KETOROLAC TROMETHAMINE 15 MG/ML IJ SOLN
15.0000 mg | Freq: Four times a day (QID) | INTRAMUSCULAR | Status: DC
  Administered 2020-02-21 – 2020-02-25 (×16): 15 mg via INTRAVENOUS
  Filled 2020-02-21 (×16): qty 1

## 2020-02-21 NOTE — Progress Notes (Signed)
CT head reviewed - unchanged tiny L ASDH with mass effect, cisterns open, please call if we can be of assistance, f/u with cabbell in 2 wks

## 2020-02-21 NOTE — Evaluation (Signed)
Physical Therapy Evaluation Patient Details Name: Andre Malone MRN: [id number] DOB: [birth year] Today's Date: 02/21/2020   History of Present Illness  HPI: 69 yo male was carrying wood and tools down stairs when he missed his footing and fell.  Workup revealed SDH and rib fx 5-8. PMH significant for blind in Right eye, CAD, s/p AVR and pacemaker, DM, HTN  Clinical Impression  Patient presents with dependencies in gait and mobility due to significant pain from rib fx and decreased balance.  Anticipate that as pain decreases, mobility will improve.  Currently recommending RW, but hopeful patient can progress to cane or no device.  Patient will benefit from continued PT in hospital and HHPT to progress mobility and return to independence.       Follow Up Recommendations Home health PT    Equipment Recommendations  Rolling walker with 5" wheels    Recommendations for Other Services       Precautions / Restrictions Precautions Precautions: Other (comment) Precaution Comments: rib fx Restrictions Weight Bearing Restrictions: No      Mobility  Bed Mobility Overal bed mobility: Needs Assistance Bed Mobility: Supine to Sit;Sit to Supine     Supine to sit: Mod assist Sit to supine: Mod assist;+2 for physical assistance      Transfers Overall transfer level: Needs assistance Equipment used: Rolling walker (2 wheeled) Transfers: Sit to/from Stand Sit to Stand: Min assist         General transfer comment: min assist for safety/balance  Ambulation/Gait Ambulation/Gait assistance: Min assist Gait Distance (Feet): 5 Feet Assistive device: Rolling walker (2 wheeled) Gait Pattern/deviations: Step-to pattern     General Gait Details: unable to ambulate far due to lines/leads and spacing in ED.  patient took 5-6 steps forward, required rest break and the backed up to stretcher.  Stairs            Wheelchair Mobility    Modified Rankin (Stroke Patients Only)        Balance Overall balance assessment: Needs assistance Sitting-balance support: No upper extremity supported;Feet supported Sitting balance-Leahy Scale: Fair     Standing balance support: Bilateral upper extremity supported;During functional activity Standing balance-Leahy Scale: Poor Standing balance comment: reliant on RW for balance                             Pertinent Vitals/Pain Pain Assessment: 0-10 Pain Score: 8  Pain Location: back/ribs Pain Descriptors / Indicators: Constant;Crushing;Discomfort Pain Intervention(s): Limited activity within patient's tolerance;Monitored during session;Patient requesting pain meds-RN notified    Home Living Family/patient expects to be discharged to:: Private residence Living Arrangements: Alone Available Help at Discharge: Neighbor;Available PRN/intermittently Type of Home: House Home Access: Stairs to enter Entrance Stairs-Rails: Right Entrance Stairs-Number of Steps: 3 Home Layout: Two level;Able to live on main level with bedroom/bathroom Home Equipment: None      Prior Function Level of Independence: Independent               Hand Dominance        Extremity/Trunk Assessment   Upper Extremity Assessment Upper Extremity Assessment: Overall WFL for tasks assessed    Lower Extremity Assessment Lower Extremity Assessment: Overall WFL for tasks assessed       Communication   Communication: No difficulties  Cognition Arousal/Alertness: Awake/alert Behavior During Therapy: WFL for tasks assessed/performed Overall Cognitive Status: Within Functional Limits for tasks assessed  General Comments      Exercises     Assessment/Plan    PT Assessment Patient needs continued PT services  PT Problem List Decreased activity tolerance;Decreased balance;Decreased mobility;Decreased knowledge of use of DME;Pain       PT Treatment Interventions DME  instruction;Gait training;Stair training;Functional mobility training;Therapeutic exercise;Therapeutic activities;Balance training;Patient/family education    PT Goals (Current goals can be found in the Care Plan section)  Acute Rehab PT Goals Patient Stated Goal: for my ribs to stop hurting PT Goal Formulation: With patient Time For Goal Achievement: 03/06/20 Potential to Achieve Goals: Good    Frequency Min 4X/week   Barriers to discharge Decreased caregiver support lives alone    Co-evaluation               AM-PAC PT "6 Clicks" Mobility  Outcome Measure Help needed turning from your back to your side while in a flat bed without using bedrails?: A Little Help needed moving from lying on your back to sitting on the side of a flat bed without using bedrails?: A Lot Help needed moving to and from a bed to a chair (including a wheelchair)?: A Little Help needed standing up from a chair using your arms (e.g., wheelchair or bedside chair)?: A Little Help needed to walk in hospital room?: A Lot Help needed climbing 3-5 steps with a railing? : A Lot 6 Click Score: 15    End of Session   Activity Tolerance: Patient limited by pain Patient left: in bed;with call bell/phone within reach   PT Visit Diagnosis: Other abnormalities of gait and mobility (R26.89)    Time: 5456-2563 PT Time Calculation (min) (ACUTE ONLY): 20 min   Charges:   PT Evaluation $PT Eval Moderate Complexity: 1 Mod          02/21/2020 Margie, PT Acute Rehabilitation Services Pager:  [phone] Office:  [phone]    Shanna Cisco 02/21/2020, 5:26 PM

## 2020-02-21 NOTE — ED Notes (Signed)
Patient transported to CT 

## 2020-02-21 NOTE — Progress Notes (Signed)
Subjective/Chief Complaint: Still complaining of left chest pain - has not been asking for much pain medication Awake, alert, oriented Repeat head CT just completed Thirsty   Objective: Vital signs in last 24 hours: Temp:  [98.2 F (36.8 C)-98.5 F (36.9 C)] 98.2 F (36.8 C) (06/18 2016) Pulse Rate:  [58-81] 64 (06/19 0730) Resp:  [0-40] 15 (06/19 0730) BP: (117-178)/(63-154) 137/75 (06/19 0730) SpO2:  [86 %-100 %] 95 % (06/19 0730) Weight:  [89.8 kg] 89.8 kg (06/18 1601)    Intake/Output from previous day: 06/18 0701 - 06/19 0700 In: 124.7 [I.V.:42; IV Piggyback:82.7] Out: -  Intake/Output this shift: Total I/O In: -  Out: 550 [Urine:550]   Constitutional:  WDWN in NAD, conversant, no obvious deformities; lying in bed comfortably Eyes:  Pupils equal, round; sclera anicteric; moist conjunctiva; no lid lag HENT:  Oral mucosa moist; left forehead scalp laceration - repaired; no hematoma Neck:  No masses palpated, trachea midline; no thyromegaly Lungs:  CTA bilaterally; minimal respiratory excursion - tender left chest CV:  Regular rate and rhythm; no murmurs; extremities well-perfused with no edema Abd:  +bowel sounds, soft, non-tender, no palpable organomegaly; no palpable hernias Musc:  Unable to assess gait; no apparent clubbing or cyanosis in extremities Lymphatic:  No palpable cervical or axillary lymphadenopathy Skin:  Warm, dry; no sign of jaundice Psychiatric - alert and oriented x 4; calm mood and affect    Lab Results:  Recent Labs    02/20/20 1640 02/20/20 1640 02/20/20 1651 02/21/20 0235  WBC 11.8*  --   --  11.1*  HGB 13.7   < > 14.3 13.8  HCT 41.3   < > 42.0 42.1  PLT 217  --   --  199   < > = values in this interval not displayed.   BMET Recent Labs    02/20/20 1640 02/20/20 1640 02/20/20 1651 02/21/20 0235  NA 137   < > 142 139  K 3.6   < > 3.7 3.7  CL 104   < > 107 106  CO2 22  --   --  23  GLUCOSE 177*   < > 180* 169*  BUN 20    < > 20 15  CREATININE 1.17   < > 1.40* 1.14  CALCIUM 8.7*  --   --  8.6*   < > = values in this interval not displayed.   PT/INR Recent Labs    02/20/20 1640 02/20/20 2221  LABPROT 21.7* 15.7*  INR 2.0* 1.3*   ABG No results for input(s): PHART, HCO3 in the last 72 hours.  Invalid input(s): PCO2, PO2  Studies/Results:  I examined the head CT from this morning - no report yet.  No significant change in small SDH  CT Head Wo Contrast  Result Date: 02/20/2020 CLINICAL DATA:  Larey Seat down steps EXAM: CT HEAD WITHOUT CONTRAST TECHNIQUE: Contiguous axial images were obtained from the base of the skull through the vertex without intravenous contrast. COMPARISON:  CT brain 10/25/2017 FINDINGS: Brain: No acute territorial infarction or intracranial mass is visualized. Left convexity slightly mixed density subdural hematoma measuring up to 5 mm maximum thickness at the parietal lobe. Hyperdense blood consistent with acute hemorrhage, though with more low attenuation also present suggesting a component of chronic subdural. About 2 mm midline shift to the right. Atrophy and chronic small vessel ischemic change of the white matter. Stable ventricle size. Vascular: No hyperdense vessels. Scattered carotid vascular calcification Skull: Normal. Negative for fracture or  focal lesion. Sinuses/Orbits: Mucosal thickening in the sinuses. Other: Moderate left forehead scalp hematoma and mild left periorbital soft tissue swelling. IMPRESSION: 1. 5 mm thick left convexity subdural hematoma, mostly hyperdense/acute in appearance, though with some scattered areas of more low attenuation suggesting an underlying component of chronic subdural. About 2 mm midline shift to the right. 2. Atrophy and mild chronic small vessel ischemic change of the white matter Critical Value/emergent results were called by telephone at the time of interpretation on 02/20/2020 at 6:47 pm to provider [facility] , who verbally acknowledged  these results. Electronically Signed   By: Donavan Foil M.D.   On: 02/20/2020 18:47   CT CHEST W CONTRAST  Result Date: 02/20/2020 CLINICAL DATA:  Lax discs are at of a dilated at the leg lata at the left the a at the I have a mass in the deltoid is probable hydrated for pickup truck send EXAM: CT CHEST, ABDOMEN, AND PELVIS WITH CONTRAST TECHNIQUE: Multidetector CT imaging of the chest, abdomen and pelvis was performed following the standard protocol during bolus administration of intravenous contrast. CONTRAST:  14mL OMNIPAQUE IOHEXOL 300 MG/ML  SOLN COMPARISON:  CT scan of the chest dated 12/29/2008 FINDINGS: CT CHEST FINDINGS Cardiovascular: No acute abnormalities. Aortic atherosclerosis. Coronary artery calcifications. CABG. Pacemaker in place. Mediastinum/Nodes: 15 mm precarinal lymph node, new since the prior study. Small lymph nodes in the right hilum. No axillary adenopathy. Thyroid gland, trachea, and esophagus are normal. Lungs/Pleura: Slight bronchiectasis at the lung bases. Infiltrates or effusions. Appreciable mass lesions. Musculoskeletal: There are fractures of the left fifth through eighth ribs posterolaterally. CT ABDOMEN PELVIS FINDINGS Hepatobiliary: No focal liver abnormality is seen. No gallstones, gallbladder wall thickening, or biliary dilatation. Pancreas: Unremarkable. No pancreatic ductal dilatation or surrounding inflammatory changes. Spleen: Normal in size without focal abnormality. Adrenals/Urinary Tract: Adrenal glands are unremarkable. Kidneys are normal, without renal calculi, focal lesion, or hydronephrosis. Bladder is unremarkable. Stomach/Bowel: Stomach is within normal limits. Appendix appears normal. No evidence of bowel wall thickening, distention, or inflammatory changes. Scattered diverticula in descending colon. Slight haziness in the mesentery, nonspecific. Vascular/Lymphatic: Aortic atherosclerosis. No enlarged abdominal or pelvic lymph nodes. Reproductive: Prostate  is unremarkable. Other: No abdominal wall hernia or abnormality. No abdominopelvic ascites. Musculoskeletal: No acute abnormality. 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 IMPRESSION: 1. Fractures of the left fifth through eighth ribs posterolaterally. 2. No acute abnormalities of the abdomen pelvis. 3. Aortic atherosclerosis. 4. 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 Aortic Atherosclerosis (ICD10-I70.0). Electronically Signed   By: Lorriane Shire M.D.   On: 02/20/2020 18:56   CT Cervical Spine Wo Contrast  Result Date: 02/20/2020 CLINICAL DATA:  Multiple trauma after falling down steps. Subdural hematoma. Multiple rib fractures. EXAM: CT CERVICAL SPINE WITHOUT CONTRAST TECHNIQUE: Multidetector CT imaging of the cervical spine was performed without intravenous contrast. Multiplanar CT image reconstructions were also generated. COMPARISON:  CT scan dated 04/25/2017 FINDINGS: Alignment: Normal. Skull base and vertebrae: No acute fracture. No primary bone lesion or focal pathologic process. Soft tissues and spinal canal: No prevertebral fluid or swelling. No visible canal hematoma. Disc levels: No discrete disc protrusions. Slight degenerative disc disease from C4-5 through C6-7 with slight disc space narrowing. Slight uncinate spurs to the right at C5-6 and C6-7. Upper chest: Negative. Other: None IMPRESSION: No acute abnormality of the cervical spine. Slight degenerative disc disease. Electronically Signed   By:  Francene Boyers M.D.   On: 02/20/2020 19:02   CT Abdomen Pelvis W Contrast  Result Date: 02/20/2020 CLINICAL DATA:  Lax discs are at of a dilated at the leg lata at the left the a at the I have a mass in the deltoid is probable hydrated for pickup truck send EXAM: CT CHEST, ABDOMEN, AND PELVIS WITH CONTRAST TECHNIQUE: Multidetector CT imaging of the chest, abdomen and pelvis was performed  following the standard protocol during bolus administration of intravenous contrast. CONTRAST:  OMNIPAQUE IOHEXOL 300 MG/ML  SOLN COMPARISON:  CT scan of the chest dated 12/29/2008 FINDINGS: CT CHEST FINDINGS Cardiovascular: No acute abnormalities. Aortic atherosclerosis. Coronary artery calcifications. CABG. Pacemaker in place. Mediastinum/Nodes: 15 mm precarinal lymph node, new since the prior study. Small lymph nodes in the right hilum. No axillary adenopathy. Thyroid gland, trachea, and esophagus are normal. Lungs/Pleura: Slight bronchiectasis at the lung bases. Infiltrates or effusions. Appreciable mass lesions. Musculoskeletal: There are fractures of the left fifth through eighth ribs posterolaterally. CT ABDOMEN PELVIS FINDINGS Hepatobiliary: No focal liver abnormality is seen. No gallstones, gallbladder wall thickening, or biliary dilatation. Pancreas: Unremarkable. No pancreatic ductal dilatation or surrounding inflammatory changes. Spleen: Normal in size without focal abnormality. Adrenals/Urinary Tract: Adrenal glands are unremarkable. Kidneys are normal, without renal calculi, focal lesion, or hydronephrosis. Bladder is unremarkable. Stomach/Bowel: Stomach is within normal limits. Appendix appears normal. No evidence of bowel wall thickening, distention, or inflammatory changes. Scattered diverticula in descending colon. Slight haziness in the mesentery, nonspecific. Vascular/Lymphatic: Aortic atherosclerosis. No enlarged abdominal or pelvic lymph nodes. Reproductive: Prostate is unremarkable. Other: No abdominal wall hernia or abnormality. No abdominopelvic ascites. Musculoskeletal: No acute abnormality. 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 IMPRESSION: 1. Fractures of the left fifth through eighth ribs posterolaterally. 2. No acute abnormalities of the abdomen pelvis. 3. Aortic atherosclerosis. 4.  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 Aortic Atherosclerosis (ICD10-I70.0). Electronically Signed   By: Francene Boyers M.D.   On: 02/20/2020 18:56    Anti-infectives: Anti-infectives (From admission, onward)   None      Assessment/Plan: Fall Left ribs 5-8 fracture Small left subdural hematoma - stable; no neurologic changes  Transfer to Progressive Care Clears, advance as tolerated Pain control Pulmonary toilet OOB to chair with assistance.  LOS: 1 day    Wynona Luna 02/21/2020

## 2020-02-21 NOTE — ED Notes (Signed)
Hospital bed ordered for pt per RN request

## 2020-02-22 LAB — PROTIME-INR
INR: 1.3 — ABNORMAL HIGH (ref 0.8–1.2)
Prothrombin Time: 15.6 seconds — ABNORMAL HIGH (ref 11.4–15.2)

## 2020-02-22 LAB — GLUCOSE, CAPILLARY
Glucose-Capillary: 203 mg/dL — ABNORMAL HIGH (ref 70–99)
Glucose-Capillary: 177 mg/dL — ABNORMAL HIGH (ref 70–99)
Glucose-Capillary: 118 mg/dL — ABNORMAL HIGH (ref 70–99)

## 2020-02-22 MED ORDER — METFORMIN HCL 500 MG PO TABS
1000.0000 mg | ORAL_TABLET | Freq: Two times a day (BID) | ORAL | Status: DC
  Administered 2020-02-22 – 2020-03-05 (×24): 1000 mg via ORAL
  Filled 2020-02-22 (×24): qty 2

## 2020-02-22 MED ORDER — AMLODIPINE BESYLATE 2.5 MG PO TABS
2.5000 mg | ORAL_TABLET | Freq: Every day | ORAL | Status: DC
  Administered 2020-02-22 – 2020-03-05 (×13): 2.5 mg via ORAL
  Filled 2020-02-22 (×13): qty 1

## 2020-02-22 MED ORDER — TRAMADOL HCL 50 MG PO TABS
50.0000 mg | ORAL_TABLET | Freq: Four times a day (QID) | ORAL | Status: DC
  Administered 2020-02-22 – 2020-03-02 (×31): 50 mg via ORAL
  Filled 2020-02-22 (×34): qty 1

## 2020-02-22 MED ORDER — INSULIN ASPART 100 UNIT/ML ~~LOC~~ SOLN
0.0000 [IU] | Freq: Three times a day (TID) | SUBCUTANEOUS | Status: DC
  Administered 2020-02-22: 5 [IU] via SUBCUTANEOUS
  Administered 2020-02-22: 3 [IU] via SUBCUTANEOUS
  Administered 2020-02-23 – 2020-02-24 (×2): 2 [IU] via SUBCUTANEOUS
  Administered 2020-02-26: 3 [IU] via SUBCUTANEOUS

## 2020-02-22 MED ORDER — LEVOTHYROXINE SODIUM 100 MCG PO TABS
100.0000 ug | ORAL_TABLET | Freq: Every day | ORAL | Status: DC
  Administered 2020-02-22 – 2020-03-05 (×13): 100 ug via ORAL
  Filled 2020-02-22 (×13): qty 1

## 2020-02-22 MED ORDER — SERTRALINE HCL 100 MG PO TABS
100.0000 mg | ORAL_TABLET | Freq: Every day | ORAL | Status: DC
  Administered 2020-02-22 – 2020-03-05 (×13): 100 mg via ORAL
  Filled 2020-02-22 (×13): qty 1

## 2020-02-22 MED ORDER — WARFARIN SODIUM 5 MG PO TABS
5.0000 mg | ORAL_TABLET | Freq: Once | ORAL | Status: AC
  Administered 2020-02-22: 5 mg via ORAL
  Filled 2020-02-22: qty 1

## 2020-02-22 MED ORDER — TRAZODONE HCL 50 MG PO TABS
100.0000 mg | ORAL_TABLET | Freq: Every evening | ORAL | Status: DC | PRN
  Administered 2020-03-04 (×2): 100 mg via ORAL
  Filled 2020-02-22 (×2): qty 2

## 2020-02-22 MED ORDER — OXYCODONE HCL 5 MG PO TABS
15.0000 mg | ORAL_TABLET | ORAL | Status: DC | PRN
  Administered 2020-02-22: 15 mg via ORAL
  Administered 2020-02-23 – 2020-02-24 (×5): 20 mg via ORAL
  Administered 2020-02-24: 15 mg via ORAL
  Administered 2020-02-25: 20 mg via ORAL
  Administered 2020-02-25 – 2020-02-26 (×3): 15 mg via ORAL
  Administered 2020-02-26: 20 mg via ORAL
  Administered 2020-02-26 – 2020-02-27 (×2): 15 mg via ORAL
  Administered 2020-02-27 (×2): 20 mg via ORAL
  Administered 2020-02-27 – 2020-02-28 (×2): 15 mg via ORAL
  Administered 2020-02-28 – 2020-02-29 (×5): 20 mg via ORAL
  Administered 2020-02-29: 15 mg via ORAL
  Administered 2020-03-01: 20 mg via ORAL
  Administered 2020-03-01 – 2020-03-02 (×5): 15 mg via ORAL
  Administered 2020-03-03: 20 mg via ORAL
  Filled 2020-02-22 (×2): qty 3
  Filled 2020-02-22: qty 4
  Filled 2020-02-22 (×4): qty 3
  Filled 2020-02-22 (×7): qty 4
  Filled 2020-02-22 (×2): qty 3
  Filled 2020-02-22 (×3): qty 4
  Filled 2020-02-22: qty 3
  Filled 2020-02-22: qty 4
  Filled 2020-02-22: qty 3
  Filled 2020-02-22 (×2): qty 4
  Filled 2020-02-22: qty 3
  Filled 2020-02-22 (×3): qty 4
  Filled 2020-02-22: qty 3
  Filled 2020-02-22 (×2): qty 4

## 2020-02-22 MED ORDER — AMIODARONE HCL 200 MG PO TABS
200.0000 mg | ORAL_TABLET | Freq: Two times a day (BID) | ORAL | Status: DC
  Administered 2020-02-22 – 2020-03-05 (×24): 200 mg via ORAL
  Filled 2020-02-22 (×27): qty 1

## 2020-02-22 MED ORDER — ALPRAZOLAM 0.25 MG PO TABS
0.2500 mg | ORAL_TABLET | Freq: Two times a day (BID) | ORAL | Status: DC | PRN
  Administered 2020-02-22 – 2020-03-05 (×3): 0.25 mg via ORAL
  Filled 2020-02-22 (×3): qty 1

## 2020-02-22 MED ORDER — FUROSEMIDE 40 MG PO TABS
40.0000 mg | ORAL_TABLET | Freq: Every day | ORAL | Status: DC
  Administered 2020-02-22 – 2020-02-26 (×4): 40 mg via ORAL
  Filled 2020-02-22 (×5): qty 1

## 2020-02-22 MED ORDER — WARFARIN - PHARMACIST DOSING INPATIENT: Freq: Every day | Status: DC

## 2020-02-22 MED ORDER — ACETAMINOPHEN 500 MG PO TABS
1000.0000 mg | ORAL_TABLET | Freq: Four times a day (QID) | ORAL | Status: DC
  Administered 2020-02-22 – 2020-03-02 (×29): 1000 mg via ORAL
  Filled 2020-02-22 (×36): qty 2
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
## 2020-02-22 NOTE — Evaluation (Signed)
Occupational Therapy Evaluation Patient Details Name: Andre Malone MRN: [id number] DOB: 11/11/1950 Today's Date: 02/22/2020    History of Present Illness 69 yo male was carrying wood and tools down stairs when he missed his footing and fell.  Workup revealed SDH and rib fx 5-8. PMH significant for blind in Right eye, CAD, s/p AVR and pacemaker, DM, HTN   Clinical Impression   PTA, pt was living alone and was independent. Pt currently requiring Min A for UB ADLs, Mod-Max A for LB ADLs, and Min A for functional transfers. Pt presenting with decreased cognition, balance, and activity tolerance impacting his safe performance of ADLs. Pt not oriented to situation, place, and time stating he was in [place] and his ribs hurt "I think because I played baseball and hurt them." Pt with decreased problem solving and coordination during functional mobility and ADLs. Pt will require further acute OT to facilitate safe dc. Due to pt's PLOF and cognitive deficits, recommend dc to CIR for intensive OT to optimize safety, independence with ADLs, and return to PLOF.     Follow Up Recommendations  CIR    Equipment Recommendations  Other (comment) (Defer ro next venue)    Recommendations for Other Services Rehab consult;PT consult;Speech consult     Precautions / Restrictions Precautions Precautions: Other (comment) Precaution Comments: Rib fx. Poor awareness      Mobility Bed Mobility Overal bed mobility: Needs Assistance Bed Mobility: Supine to Sit     Supine to sit: Max assist     General bed mobility comments: Max A bringing BLEs towards EOB and then elevating trunk. Pt also presenting with decreased coordination and problem solving.   Transfers Overall transfer level: Needs assistance   Transfers: Sit to/from Stand;Stand Pivot Transfers Sit to Stand: Min assist Stand pivot transfers: Min assist       General transfer comment: Min A for power up and then to maintain balance  during pivot to recliner. Min A for posterior lean    Balance Overall balance assessment: Needs assistance Sitting-balance support: No upper extremity supported;Feet supported Sitting balance-Leahy Scale: Fair     Standing balance support: Bilateral upper extremity supported;During functional activity Standing balance-Leahy Scale: Poor                             ADL either performed or assessed with clinical judgement   ADL Overall ADL's : Needs assistance/impaired Eating/Feeding: Set up;Sitting   Grooming: Set up;Sitting;Supervision/safety   Upper Body Bathing: Minimal assistance;Sitting   Lower Body Bathing: Moderate assistance;Sit to/from stand   Upper Body Dressing : Minimal assistance;Sitting   Lower Body Dressing: Maximal assistance;Sit to/from stand   Toilet Transfer: Minimal Scientist, forensic Details (indicate cue type and reason): Min A for posterior lean         Functional mobility during ADLs: Minimal assistance General ADL Comments: Pt presenting with decreased cognition, balance, and activity tolerance.     Vision Baseline Vision/History: Wears glasses Wears Glasses: At all times Patient Visual Report: No change from baseline       Perception     Praxis      Pertinent Vitals/Pain Pain Assessment: Faces Faces Pain Scale: Hurts even more Pain Location: back/ribs Pain Descriptors / Indicators: Constant;Crushing;Discomfort Pain Intervention(s): Monitored during session;Limited activity within patient's tolerance;Repositioned     Hand Dominance Right   Extremity/Trunk Assessment Upper Extremity Assessment Upper Extremity Assessment: Overall WFL for tasks assessed   Lower Extremity Assessment  Lower Extremity Assessment: Overall WFL for tasks assessed   Cervical / Trunk Assessment Cervical / Trunk Assessment: Other exceptions Cervical / Trunk Exceptions: L rib fxs   Communication Communication Communication:  No difficulties   Cognition Arousal/Alertness: Awake/alert Behavior During Therapy: WFL for tasks assessed/performed Overall Cognitive Status: Impaired/Different from baseline Area of Impairment: Orientation;Attention;Following commands;Memory;Awareness;Safety/judgement;Problem solving                 Orientation Level: Disoriented to;Situation;Time;Place Current Attention Level: Focused;Sustained Memory: Decreased short-term memory Following Commands: Follows one step commands inconsistently;Follows one step commands with increased time Safety/Judgement: Decreased awareness of safety;Decreased awareness of deficits Awareness: Intellectual Problem Solving: Decreased initiation;Slow processing;Difficulty sequencing;Requires verbal cues;Requires tactile cues General Comments: Pt presenting with decreased orientation not knowing where he was, why he was in the hospital, why his head and ribs hurt, or what day it was. When asking pt why his ribs hurt, he stated "I think I was playing baseball and hurt them." When asking pt where he was he stated "[place]." Orienting pt to situation, date, and place. Pt able to state at end of session that his head hurt because "I fell?" Pt was requiring cues throughout for problem solving of mobility and ADLs. Pt presenting with poor awareness of deficits. Pt also perseverating on topic of foley being in place to use and urinate; despite cues throughout that his foley was in place, pt not urinating. Throughout session pt requiring cues for problem solving and attention.    General Comments  SpO2 90s on RA    Exercises     Shoulder Instructions      Home Living Family/patient expects to be discharged to:: Private residence Living Arrangements: Alone Available Help at Discharge: Neighbor;Available PRN/intermittently Type of Home: House Home Access: Stairs to enter [employer] of Steps: 3 Entrance Stairs-Rails: Right Home Layout: Two  level;Able to live on main level with bedroom/bathroom Alternate Level Stairs-Number of Steps: flight             Home Equipment: None      Lives With: Alone    Prior Functioning/Environment Level of Independence: Independent                 OT Problem List: Decreased strength;Decreased range of motion;Decreased activity tolerance;Impaired balance (sitting and/or standing);Decreased cognition;Decreased knowledge of use of DME or AE;Decreased safety awareness;Decreased knowledge of precautions;Pain      OT Treatment/Interventions: Self-care/ADL training;Therapeutic exercise;Energy conservation;DME and/or AE instruction;Therapeutic activities;Patient/family education;Cognitive remediation/compensation    OT Goals(Current goals can be found in the care plan section) Acute Rehab OT Goals Patient Stated Goal: Get back to normal and independence OT Goal Formulation: With patient Time For Goal Achievement: 03/07/20 Potential to Achieve Goals: Good  OT Frequency: Min 2X/week   Barriers to D/C:            Co-evaluation              AM-PAC OT "6 Clicks" Daily Activity     Outcome Measure Help from another person eating meals?: A Little Help from another person taking care of personal grooming?: A Little Help from another person toileting, which includes using toliet, bedpan, or urinal?: A Lot Help from another person bathing (including washing, rinsing, drying)?: A Lot Help from another person to put on and taking off regular upper body clothing?: A Little Help from another person to put on and taking off regular lower body clothing?: A Lot 6 Click Score: 15   End of Session Nurse Communication:  Mobility status  Activity Tolerance: Patient tolerated treatment well Patient left: in chair;with call bell/phone within reach;with chair alarm set  OT Visit Diagnosis: Unsteadiness on feet (R26.81);Other abnormalities of gait and mobility (R26.89);Muscle weakness  (generalized) (M62.81);Pain;Other symptoms and signs involving cognitive function Pain - Right/Left: Left Pain - part of body:  (head and ribs)                Time: 3235-5732 OT Time Calculation (min): 30 min Charges:  OT General Charges $OT Visit: 1 Visit OT Evaluation $OT Eval Moderate Complexity: 1 Mod OT Treatments $Self Care/Home Management : 8-22 mins  Melanie Openshaw MSOT, OTR/L Acute Rehab Pager: [phone] Office: [phone]  Theodoro Grist Kaysia Willard 02/22/2020, 5:21 PM
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
## 2020-02-23 LAB — CBC
HCT: 35.9 % — ABNORMAL LOW (ref 39.0–52.0)
Hemoglobin: 11.7 g/dL — ABNORMAL LOW (ref 13.0–17.0)
MCH: 30.5 pg (ref 26.0–34.0)
MCHC: 32.6 g/dL (ref 30.0–36.0)
MCV: 93.7 fL (ref 80.0–100.0)
Platelets: 108 10*3/uL — ABNORMAL LOW (ref 150–400)
RBC: 3.83 MIL/uL — ABNORMAL LOW (ref 4.22–5.81)
RDW: 14.1 % (ref 11.5–15.5)
WBC: 8.3 10*3/uL (ref 4.0–10.5)
nRBC: 0 % (ref 0.0–0.2)

## 2020-02-23 LAB — GLUCOSE, CAPILLARY
Glucose-Capillary: 90 mg/dL (ref 70–99)
Glucose-Capillary: 123 mg/dL — ABNORMAL HIGH (ref 70–99)
Glucose-Capillary: 147 mg/dL — ABNORMAL HIGH (ref 70–99)
Glucose-Capillary: 98 mg/dL (ref 70–99)

## 2020-02-23 LAB — BASIC METABOLIC PANEL
Anion gap: 7 (ref 5–15)
BUN: 22 mg/dL (ref 8–23)
CO2: 23 mmol/L (ref 22–32)
Calcium: 8.2 mg/dL — ABNORMAL LOW (ref 8.9–10.3)
Chloride: 108 mmol/L (ref 98–111)
Creatinine, Ser: 1.26 mg/dL — ABNORMAL HIGH (ref 0.61–1.24)
GFR calc Af Amer: >60 mL/min (ref >60)
GFR calc non Af Amer: 58 mL/min — ABNORMAL LOW (ref >60)
Glucose, Bld: 130 mg/dL — ABNORMAL HIGH (ref 70–99)
Potassium: 4.1 mmol/L (ref 3.5–5.1)
Sodium: 138 mmol/L (ref 135–145)

## 2020-02-23 LAB — PROTIME-INR
INR: 1.3 — ABNORMAL HIGH (ref 0.8–1.2)
Prothrombin Time: 15.4 seconds — ABNORMAL HIGH (ref 11.4–15.2)

## 2020-02-23 MED ORDER — HYDROMORPHONE HCL 1 MG/ML IJ SOLN
0.5000 mg | Freq: Two times a day (BID) | INTRAMUSCULAR | Status: DC | PRN
  Administered 2020-02-24: 0.5 mg via INTRAVENOUS
  Filled 2020-02-23: qty 1

## 2020-02-23 MED ORDER — LEVETIRACETAM 500 MG PO TABS
500.0000 mg | ORAL_TABLET | Freq: Two times a day (BID) | ORAL | Status: AC
  Administered 2020-02-23 – 2020-02-29 (×14): 500 mg via ORAL
  Filled 2020-02-23 (×14): qty 1

## 2020-02-23 MED ORDER — WARFARIN SODIUM 10 MG PO TABS
10.0000 mg | ORAL_TABLET | Freq: Once | ORAL | Status: AC
  Administered 2020-02-23: 10 mg via ORAL
  Filled 2020-02-23: qty 1

## 2020-02-23 NOTE — TOC CAGE-AID Note (Signed)
Transition of Care [facility]) - CAGE-AID Screening   Patient Details  Name: Andre Malone MRN: [id number] Date of Birth: 1951-08-12  [facility]) CM/SW Contact:    Jimmy Picket, [place] Phone Number: 02/23/2020, 12:50 PM   Clinical Narrative: Pt reports 1-2 beers a couple times a week. Pt states that he has cut down on his alcohol use. Pt stated his wife died in 08/27/23 and he became depressed and started drinking. Pt states he has a bereavement counselor now that helps him deal with his wifes passing and he has cut back his alcohol use. Pt denies substance use.    CAGE-AID Screening:    Have You Ever Felt You Ought to Cut Down on Your Drinking or Drug Use?: Yes Have People Annoyed You By Critizing Your Drinking Or Drug Use?: Yes Have You Felt Bad Or Guilty About Your Drinking Or Drug Use?: Yes Have You Ever Had a Drink or Used Drugs First Thing In The Morning to STeady Your Nerves or to Get Rid of a Hangover?: No CAGE-AID Score: 3  Substance Abuse Education Offered: Yes  Substance abuse interventions: Patient Counseling, Educational Materials  Jimmy Picket, Theresia Majors, [place] Clinical Social Worker [phone]

## 2020-02-23 NOTE — Progress Notes (Signed)
Trauma/Critical Care Follow Up Note  Subjective:    Overnight Issues:   Objective:  Vital signs for last 24 hours: Temp:  [97.6 F (36.4 C)-98.6 F (37 C)] 98.3 F (36.8 C) (06/21 0742) Pulse Rate:  [59-66] 59 (06/21 0352) Resp:  [16-22] 16 (06/21 0742) BP: (108-151)/(66-91) 140/66 (06/21 0742) SpO2:  [94 %-97 %] 95 % (06/21 0742)  Hemodynamic parameters for last 24 hours:    Intake/Output from previous day: 06/20 0701 - 06/21 0700 In: 1241.9 [P.O.:351; I.V.:890.9] Out: 1050 [Urine:1050]  Intake/Output this shift: No intake/output data recorded.  Vent settings for last 24 hours:    Physical Exam:  Gen: comfortable, no distress Neuro: non-focal exam HEENT: PERRL Neck: supple CV: RRR Pulm: unlabored breathing Abd: soft, NT GU: clear yellow urine Extr: wwp, no edema   Results for orders placed or performed during the hospital encounter of 02/20/20 (from the past 24 hour(s))  Glucose, capillary     Status: Abnormal   Collection Time: 02/22/20  5:11 PM  Result Value Ref Range   Glucose-Capillary 177 (H) 70 - 99 mg/dL  Glucose, capillary     Status: Abnormal   Collection Time: 02/22/20 10:33 PM  Result Value Ref Range   Glucose-Capillary 118 (H) 70 - 99 mg/dL  CBC     Status: Abnormal   Collection Time: 02/23/20  3:23 AM  Result Value Ref Range   WBC 8.3 4.0 - 10.5 K/uL   RBC 3.83 (L) 4.22 - 5.81 MIL/uL   Hemoglobin 11.7 (L) 13.0 - 17.0 g/dL   HCT 54.6 (L) 39 - 52 %   MCV 93.7 80.0 - 100.0 fL   MCH 30.5 26.0 - 34.0 pg   MCHC 32.6 30.0 - 36.0 g/dL   RDW 27.0 35.0 - 09.3 %   Platelets 108 (L) 150 - 400 K/uL   nRBC 0.0 0.0 - 0.2 %  Basic metabolic panel     Status: Abnormal   Collection Time: 02/23/20  3:23 AM  Result Value Ref Range   Sodium 138 135 - 145 mmol/L   Potassium 4.1 3.5 - 5.1 mmol/L   Chloride 108 98 - 111 mmol/L   CO2 23 22 - 32 mmol/L   Glucose, Bld 130 (H) 70 - 99 mg/dL   BUN 22 8 - 23 mg/dL   Creatinine, Ser 8.18 (H) 0.61 - 1.24  mg/dL   Calcium 8.2 (L) 8.9 - 10.3 mg/dL   GFR calc non Af Amer 58 (L) >60 mL/min   GFR calc Af Amer >60 >60 mL/min   Anion gap 7 5 - 15  Protime-INR     Status: Abnormal   Collection Time: 02/23/20  3:23 AM  Result Value Ref Range   Prothrombin Time 15.4 (H) 11.4 - 15.2 seconds   INR 1.3 (H) 0.8 - 1.2  Glucose, capillary     Status: None   Collection Time: 02/23/20  6:41 AM  Result Value Ref Range   Glucose-Capillary 90 70 - 99 mg/dL   Comment 1 Document in Chart   Glucose, capillary     Status: Abnormal   Collection Time: 02/23/20 12:50 PM  Result Value Ref Range   Glucose-Capillary 123 (H) 70 - 99 mg/dL    Assessment & Plan:  Present on Admission:  SDH (subdural hematoma) (HCC)    LOS: 3 days   Additional comments:I reviewed the patient's new clinical lab test results.   and I reviewed the patients new imaging test results.    Fall  Left ribs 5-8 fracture - pain control, IS, mobilization, therapies Small left subdural hematoma - NSGY c/s, no intervention, keppra x7d. Ok to restart anticoagulation given 48 hrs since fall DM - resume home meds, SSI Chronic pain - home oxy 15mg  and tramadol  HTN - resume home meds Depression - resume zoloft A fib - resumed coumadin, pharmacy to dose CAD - resumed amio and other cardiac home meds FEN - carb mod/HH diet, restarted home lasix VTE - restarted coumadin, trend INR, SCDs ID - none  Dispo - progressive, recs for CIR from OT/ST, recs for home from PT  Called son twice (once in AM, once in PM) today to discuss, no answer, left VM.    Jesusita Oka, MD Trauma & General Surgery Please use [email or web address] to contact on call provider  02/23/2020  *Care during the described time interval was provided by me. I have reviewed this patient's available data, including medical history, events of note, physical examination and test results as part of my evaluation.

## 2020-02-23 NOTE — Progress Notes (Signed)
Physical Therapy Treatment Patient Details Name: Andre Malone MRN: [id number] DOB: 01-Sep-1951 Today's Date: 02/23/2020    History of Present Illness 69 yo male was carrying wood and tools down stairs when he missed his footing and fell.  Workup revealed  very small SDH and rib fx 5-8. PMH significant for blind in Right eye, CAD, s/p AVR and pacemaker, DM, HTN    PT Comments    Pt making steady progress with mobility.  Concerned with pt being home alone with decr mobility and impaired cognition.   Follow Up Recommendations  CIR     Equipment Recommendations  Other (comment) (rollator)    Recommendations for Other Services       Precautions / Restrictions Precautions Precautions: Fall    Mobility  Bed Mobility Overal bed mobility: Needs Assistance Bed Mobility: Supine to Sit;Sit to Supine     Supine to sit: Min assist;HOB elevated Sit to supine: Min assist;HOB elevated   General bed mobility comments: Assist to elevate trunk into sitting. Assist to bring legs back up into the bed.   Transfers Overall transfer level: Needs assistance Equipment used: 4-wheeled walker Transfers: Sit to/from [employer] Sit to Stand: Min assist         General transfer comment: Assist to bring hips up and for balance. Verbal cues for hand placement  Ambulation/Gait Ambulation/Gait assistance: Min assist Gait Distance (Feet): 150 Feet Assistive device: 4-wheeled walker Gait Pattern/deviations: Step-through pattern;Decreased stride length;Trunk flexed Gait velocity: decr Gait velocity interpretation: 1.31 - 2.62 ft/sec, indicative of limited community ambulator General Gait Details: Assist for balance and support. Verbal cues to stand more erect.   Stairs             Wheelchair Mobility    Modified Rankin (Stroke Patients Only)       Balance Overall balance assessment: Needs assistance Sitting-balance support: No upper extremity supported;Feet  supported Sitting balance-Leahy Scale: Fair     Standing balance support: During functional activity;Single extremity supported Standing balance-Leahy Scale: Poor Standing balance comment: UE support and supervision for static standing                            Cognition Arousal/Alertness: Awake/alert Behavior During Therapy: WFL for tasks assessed/performed Overall Cognitive Status: Impaired/Different from baseline Area of Impairment: Attention;Following commands;Memory;Awareness;Safety/judgement;Problem solving;Orientation                 Orientation Level: Disoriented to;Time Current Attention Level: Selective Memory: Decreased short-term memory Following Commands: Follows one step commands consistently Safety/Judgement: Decreased awareness of deficits Awareness: Emergent Problem Solving: Slow processing;Requires verbal cues General Comments: Pt able to tell me how he was carrying things in both hands down the steps when he fell. He was working on a ramp for his dog.       Exercises      General Comments General comments (skin integrity, edema, etc.): Amb on RA with SpO2 95%.       Pertinent Vitals/Pain Pain Assessment: Faces Faces Pain Scale: Hurts even more Pain Location: back/ribs Pain Descriptors / Indicators: Grimacing;Guarding    Home Living                      Prior Function            PT Goals (current goals can now be found in the care plan section) Acute Rehab PT Goals Patient Stated Goal: Get back to normal and independence  Progress towards PT goals: Progressing toward goals    Frequency    Min 3X/week      PT Plan Current plan remains appropriate;Frequency needs to be updated    Co-evaluation              AM-PAC PT "6 Clicks" Mobility   Outcome Measure  Help needed turning from your back to your side while in a flat bed without using bedrails?: A Little Help needed moving from lying on your back to  sitting on the side of a flat bed without using bedrails?: A Little Help needed moving to and from a bed to a chair (including a wheelchair)?: A Little Help needed standing up from a chair using your arms (e.g., wheelchair or bedside chair)?: A Little Help needed to walk in hospital room?: A Little Help needed climbing 3-5 steps with a railing? : A Little 6 Click Score: 18    End of Session Equipment Utilized During Treatment: Gait belt Activity Tolerance: Patient tolerated treatment well Patient left: in bed;with call bell/phone within reach;with bed alarm set Nurse Communication: Mobility status;Other (comment) (left O2 off) PT Visit Diagnosis: Other abnormalities of gait and mobility (R26.89);Pain Pain - Right/Left: Left Pain - part of body:  (ribs)     Time: 9833-8250 PT Time Calculation (min) (ACUTE ONLY): 37 min  Charges:  $Gait Training: 23-37 mins                     [place] Pager [phone] Office [place] 02/23/2020, 2:38 PM
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
## 2020-02-23 NOTE — Progress Notes (Signed)
Pt says Amiodarone makes him confused.  He did have a bout of confusion last night for a few minutes where he tried to climb out of bed by himself.

## 2020-02-23 NOTE — Progress Notes (Signed)
Inpatient Rehab Admissions Coordinator Note:   Per OT/ST/PT recommendations, pt was screened for CIR candidacy by Wolfgang Phoenix, MS, CCC-SLP.  At this time we are recommending an Inpatient Rehab consult.  AC will place consult order per protocol.  Please contact me with questions.   Wolfgang Phoenix, MS, CCC-SLP Admissions Coordinator [phone] 02/23/20 3:08 PM

## 2020-02-24 DIAGNOSIS — S065X9A Traumatic subdural hemorrhage with loss of consciousness of unspecified duration, initial encounter: Secondary | ICD-10-CM

## 2020-02-24 LAB — CBC
HCT: 35.1 % — ABNORMAL LOW (ref 39.0–52.0)
Hemoglobin: 11.4 g/dL — ABNORMAL LOW (ref 13.0–17.0)
MCH: 30.3 pg (ref 26.0–34.0)
MCHC: 32.5 g/dL (ref 30.0–36.0)
MCV: 93.4 fL (ref 80.0–100.0)
Platelets: 130 10*3/uL — ABNORMAL LOW (ref 150–400)
RBC: 3.76 MIL/uL — ABNORMAL LOW (ref 4.22–5.81)
RDW: 14.2 % (ref 11.5–15.5)
WBC: 8.5 10*3/uL (ref 4.0–10.5)
nRBC: 0 % (ref 0.0–0.2)

## 2020-02-24 LAB — GLUCOSE, CAPILLARY
Glucose-Capillary: 85 mg/dL (ref 70–99)
Glucose-Capillary: 103 mg/dL — ABNORMAL HIGH (ref 70–99)
Glucose-Capillary: 123 mg/dL — ABNORMAL HIGH (ref 70–99)
Glucose-Capillary: 113 mg/dL — ABNORMAL HIGH (ref 70–99)

## 2020-02-24 LAB — BASIC METABOLIC PANEL
Anion gap: 9 (ref 5–15)
BUN: 24 mg/dL — ABNORMAL HIGH (ref 8–23)
CO2: 25 mmol/L (ref 22–32)
Calcium: 8.3 mg/dL — ABNORMAL LOW (ref 8.9–10.3)
Chloride: 102 mmol/L (ref 98–111)
Creatinine, Ser: 1.3 mg/dL — ABNORMAL HIGH (ref 0.61–1.24)
GFR calc Af Amer: >60 mL/min (ref >60)
GFR calc non Af Amer: 56 mL/min — ABNORMAL LOW (ref >60)
Glucose, Bld: 98 mg/dL (ref 70–99)
Potassium: 3.8 mmol/L (ref 3.5–5.1)
Sodium: 136 mmol/L (ref 135–145)

## 2020-02-24 LAB — PROTIME-INR
INR: 1.2 (ref 0.8–1.2)
Prothrombin Time: 14.8 seconds (ref 11.4–15.2)

## 2020-02-24 MED ORDER — WARFARIN SODIUM 5 MG PO TABS
10.0000 mg | ORAL_TABLET | Freq: Once | ORAL | Status: AC
  Administered 2020-02-24: 10 mg via ORAL
  Filled 2020-02-24: qty 2
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
## 2020-02-24 NOTE — TOC Initial Note (Signed)
Transition of Care [facility]) - Initial/Assessment Note    Patient Details  Name: Andre Malone MRN: [id number] Date of Birth: July 26, 1951  Transition of Care [facility]) CM/SW Contact:    Ella Bodo, RN Phone Number: 02/24/2020, 10:28 AM  Clinical Narrative: 69 yo male was carrying wood and tools down stairs when he missed his footing and fell.  Workup revealed  very small SDH and rib fx 5-8. PTA, pt independent, lives alone.  Pt with intermittent confusion and disorientation; spoke with pt's son, Rodman Key, this AM to discuss discharge planning.  Son states confusion has been going on for some time, possibly a year or more.  He states that he and his father have picked out an independent living apartment for him that he will move to after he is recovered.  He states it is fully staffed with 24/7 support.  Discussed recommendation of CIR by therapies; son is in favor of this, and will encourage his father to go to rehab.  Son states that after pt's wife died in 2023-07-03, patient did have two suicide attempts, and had psychiatric hospitalization at the [place], but no issues since.  Son plans to visit pt today; will follow progress.                   Expected Discharge Plan: IP Rehab Facility Barriers to Discharge: Continued Medical Work up   Patient Goals and CMS Choice     Choice offered to / list presented to : Patient  Expected Discharge Plan and Services Expected Discharge Plan: [facility]   Discharge Planning Services: CM Consult Post Acute Care Choice: IP Rehab Living arrangements for the past 2 months: Single Family Home                                      Prior Living Arrangements/Services Living arrangements for the past 2 months: Single Family Home Lives with:: Self Patient language and need for interpreter reviewed:: Yes Do you feel safe going back to the place where you live?: Yes      Need for Family Participation in Patient Care: Yes (Comment) Care giver  support system in place?: Yes (comment)   Criminal Activity/Legal Involvement Pertinent to Current Situation/Hospitalization: No - Comment as needed  Activities of Daily Living Home Assistive Devices/Equipment: Eyeglasses ADL Screening (condition at time of admission) Patient's cognitive ability adequate to safely complete daily activities?: Yes Is the patient deaf or have difficulty hearing?: No Does the patient have difficulty seeing, even when wearing glasses/contacts?: No Does the patient have difficulty concentrating, remembering, or making decisions?: No Patient able to express need for assistance with ADLs?: Yes Does the patient have difficulty dressing or bathing?: Yes Independently performs ADLs?: No Communication: Independent Dressing (OT): Needs assistance Is this a change from baseline?: Change from baseline, expected to last <3days Grooming: Independent Feeding: Independent Bathing: Needs assistance Is this a change from baseline?: Change from baseline, expected to last <3 days Toileting: Needs assistance Is this a change from baseline?: Change from baseline, expected to last <3 days In/Out Bed: Needs assistance Is this a change from baseline?: Change from baseline, expected to last <3 days Walks in Home: Needs assistance Is this a change from baseline?: Change from baseline, expected to last <3 days Does the patient have difficulty walking or climbing stairs?: No Weakness of Legs: None Weakness of Arms/Hands: None  Permission Sought/Granted Permission  sought to share information with : Family Supports Permission granted to share information with : Yes, Verbal Permission Granted        Permission granted to share info w Relationship: son, Mattherw  Permission granted to share info w Contact Information: [phone]  Emotional Assessment Appearance:: Appears stated age Attitude/Demeanor/Rapport: Engaged Affect (typically observed): Appropriate Orientation: :  Oriented to Self, Oriented to Place, Oriented to  Time, Oriented to Situation      Admission diagnosis:  Subdural hematoma (HCC) [S06.5X9A] SDH (subdural hematoma) (HCC) [S06.5X9A] Fall, initial encounter [W19.XXXA] Closed fracture of multiple ribs of left side, initial encounter [S22.42XA] Patient Active Problem List   Diagnosis Date Noted  . SDH (subdural hematoma) (HCC) 02/20/2020  . Acute on chronic systolic heart failure (HCC) 08/11/2017  . 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 Chronic anticoagulation 08/11/2017  . Primary insomnia 05/10/2017  . Recurrent major depressive disorder, in full remission (HCC) 05/10/2017  . RLS (restless legs syndrome) 02/04/2016  . Vitamin D deficiency 12/21/2015  . Atrioventricular block, complete (HCC) 09/17/2013  . Pacemaker  st Judes   . S/P AVR (aortic valve replacement) 08/22/2013  . Hypertension associated with diabetes (HCC) 08/22/2013  . NSTEMI (non-ST elevated myocardial infarction) (HCC) 05/29/2013  . Paroxysmal atrial fibrillation (HCC) 11/20/2012  . CAD, NATIVE VESSEL 12/28/2008  . Hypothyroidism 12/23/2008  . Type 2 diabetes mellitus with diabetic neuropathy, with long-term current use of insulin (HCC) 12/23/2008  . Overweight 12/23/2008  . BLINDNESS, RIGHT EYE 12/23/2008  . Aortic valve disorder 12/23/2008  . Thoracic aortic aneurysm (TAA) (HCC) 12/23/2008  . Chronic kidney disease 12/23/2008  . HEADACHE, CHRONIC 12/23/2008   PCP:  Bennie Pierini, FNP Pharmacy:   [facility] [phone] - [place], [place] - [address] [place] [address] Phone: [phone] Fax: [phone]     Social Determinants of Health (SDOH) Interventions    Readmission Risk Interventions No flowsheet data found.  Quintella Baton, RN, BSN  Trauma/Neuro ICU Case Manager [phone]

## 2020-02-24 NOTE — Progress Notes (Signed)
Occupational Therapy Treatment Patient Details Name: Andre Malone MRN: [id number] DOB: 1950/11/09 Today's Date: 02/24/2020    History of present illness 69 yo male was carrying wood and tools down stairs when he missed his footing and fell.  Workup revealed  very small SDH and rib fx 5-8. PMH significant for blind in Right eye, CAD, s/p AVR and pacemaker, DM, HTN   OT comments  Patient continues to make steady progress towards goals in skilled OT session. Patient's session encompassed bed mobility, and basic ADLs, with hand off to PT at end of session to increase mobility. Pt continues to make gains in overall cognition (suspect to be at baseline now) however remains slow processing due to pain and increased drowsiness secondary to medication. Pt is able to follow all commands provided and ability to problem solve mobility to decrease rib pain. Pt continues to make progress towards goals, and remains a strong candidate for rehab in order to regain prior level of function.    Follow Up Recommendations  CIR    Equipment Recommendations  Other (comment) (Defer to next venue)    Recommendations for Other Services      Precautions / Restrictions Precautions Precautions: Fall Precaution Comments: Rib fractures Restrictions Weight Bearing Restrictions: No       Mobility Bed Mobility Overal bed mobility: Needs Assistance Bed Mobility: Supine to Sit     Supine to sit: Min assist;HOB elevated     General bed mobility comments: Pt with need for cues to sequence movement, requires significant extra time due to pain, however is able to cogitate  Transfers Overall transfer level: Needs assistance Equipment used: Rolling walker (2 wheeled) Transfers: Sit to/from Stand Sit to Stand: Min guard;From elevated surface         General transfer comment: min guard for safety, requires elevated surface due to pain with forward flexion of trunk    Balance Overall balance assessment:  Needs assistance Sitting-balance support: Feet supported Sitting balance-Leahy Scale: Fair     Standing balance support: Bilateral upper extremity supported;During functional activity Standing balance-Leahy Scale: Fair Standing balance comment: UE support and supervision for safety with mobility                           ADL either performed or assessed with clinical judgement   ADL Overall ADL's : Needs assistance/impaired     Grooming: Set up;Sitting;Supervision/safety;Oral care Grooming Details (indicate cue type and reason): Pt slow to sequence actions (suspect due to pain and medication) however was able to complete appropriately without cognitive deficits noted                             Functional mobility during ADLs: Min guard General ADL Comments: pt with improved cognition in session, however continues to have a low activity tolerance due to pain in ribs     Vision       Perception     Praxis      Cognition Arousal/Alertness: Awake/alert Behavior During Therapy: WFL for tasks assessed/performed Overall Cognitive Status: No family/caregiver present to determine baseline cognitive functioning Area of Impairment: Attention;Following commands;Memory;Awareness;Safety/judgement;Problem solving;Orientation                 Orientation Level: Disoriented to;Time Current Attention Level: Selective Memory: Decreased short-term memory Following Commands: Follows one step commands consistently Safety/Judgement: Decreased awareness of deficits Awareness: Emergent Problem Solving: Slow processing;Requires verbal cues  General Comments: Pt's cognition is definetly improving, and has a witty sense of humor, slow processing appears to be the product of significant pain in ribs        Exercises     Shoulder Instructions       General Comments      Pertinent Vitals/ Pain       Pain Assessment: Faces Faces Pain Scale: Hurts whole lot Pain  Location: back/ribs Pain Descriptors / Indicators: Grimacing;Guarding;Sore Pain Intervention(s): Limited activity within patient's tolerance;Monitored during session;Premedicated before session  Home Living                                          Prior Functioning/Environment              Frequency  Min 2X/week        Progress Toward Goals  OT Goals(current goals can now be found in the care plan section)  Progress towards OT goals: Progressing toward goals  Acute Rehab OT Goals Patient Stated Goal: Get back to normal and independence OT Goal Formulation: With patient Time For Goal Achievement: 03/07/20 Potential to Achieve Goals: Good  Plan Discharge plan remains appropriate    Co-evaluation                 AM-PAC OT "6 Clicks" Daily Activity     Outcome Measure   Help from another person eating meals?: A Little Help from another person taking care of personal grooming?: A Little Help from another person toileting, which includes using toliet, bedpan, or urinal?: A Little Help from another person bathing (including washing, rinsing, drying)?: A Lot Help from another person to put on and taking off regular upper body clothing?: A Little Help from another person to put on and taking off regular lower body clothing?: A Lot 6 Click Score: 16    End of Session    OT Visit Diagnosis: Unsteadiness on feet (R26.81);Other abnormalities of gait and mobility (R26.89);Muscle weakness (generalized) (M62.81);Pain;Other symptoms and signs involving cognitive function Pain - Right/Left: Left Pain - part of body:  (Ribs)   Activity Tolerance Patient tolerated treatment well   Patient Left Other (comment) (Left EOB with PT)   Nurse Communication Mobility status        Time: 1110-1131 OT Time Calculation (min): 21 min  Charges: OT General Charges $OT Visit: 1 Visit OT Treatments $Self Care/Home Management : 8-22 mins  Corinne Ports E. Duel Conrad,  COTA/L Acute Rehabilitation Services [place] 02/24/2020, 12:54 PM

## 2020-02-24 NOTE — Progress Notes (Signed)
Pt transferred to 5N29. RN given report. Pts. Vss.    Everlean Cherry, RN
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
## 2020-02-24 NOTE — Plan of Care (Signed)
  Problem: Health Behavior/Discharge Planning: Goal: Ability to manage health-related needs will improve Outcome: Progressing   Problem: Clinical Measurements: Goal: Ability to maintain clinical measurements within normal limits will improve Outcome: Progressing   Problem: Activity: Goal: Risk for activity intolerance will decrease Outcome: Progressing   Problem: Nutrition: Goal: Adequate nutrition will be maintained Outcome: Progressing   Problem: Coping: Goal: Level of anxiety will decrease Outcome: Progressing   Problem: Elimination: Goal: Will not experience complications related to bowel motility Outcome: Progressing   Problem: Safety: Goal: Ability to remain free from injury will improve Outcome: Progressing   Problem: Skin Integrity: Goal: Risk for impaired skin integrity will decrease Outcome: Progressing   

## 2020-02-24 NOTE — Progress Notes (Signed)
Physical Therapy Treatment Patient Details Name: Andre Malone MRN: [id number] DOB: 02-Mar-1951 Today's Date: 02/24/2020    History of Present Illness 69 yo male was carrying wood and tools down stairs when he missed his footing and fell.  Workup revealed  very small SDH and rib fx 5-8. PMH significant for blind in Right eye, CAD, s/p AVR and pacemaker, DM, HTN    PT Comments    Patient received sitting edge of bed finishing OT session. Agrees to PT session. Reports he would like to walk into the bathroom. Patient requires bed height moderately elevated to assist with transfers due to rib pain. He requires increased time with all mobility tasks due to pain. (and is possibly groggy/lethargic due to pain medicine recieved earlier this am)  He ambulated 40 feet with RW and min guard. Ambulates steadily, but moves slowly and requires min cues for safety especially with turning around. Session ended due to RN coming to assist him with moving to another room/unit. He will continue to benefit from skilled PT while here to improve mobility, safety and independence prior to returning home alone.      Follow Up Recommendations  CIR     Equipment Recommendations  Other (comment) (TBD)    Recommendations for Other Services       Precautions / Restrictions Precautions Precautions: Fall Precaution Comments: Rib fractures Restrictions Weight Bearing Restrictions: No    Mobility  Bed Mobility               General bed mobility comments: not assessed, patient received sitting edge of bed and was left in transport chair to move to 5N  Transfers Overall transfer level: Needs assistance Equipment used: Rolling walker (2 wheeled) Transfers: Sit to/from Stand Sit to Stand: Min guard;From elevated surface         General transfer comment: min guard for safety, requires elevated surface due to pain with forward flexion of trunk  Ambulation/Gait Ambulation/Gait assistance: Min  guard Gait Distance (Feet): 40 Feet Assistive device: Rolling walker (2 wheeled) Gait Pattern/deviations: Step-through pattern;Decreased stride length;Shuffle Gait velocity: decr   General Gait Details: requires UE assist for balance and supervision for safety with mobility   Stairs             Wheelchair Mobility    Modified Rankin (Stroke Patients Only)       Balance Overall balance assessment: Needs assistance Sitting-balance support: Feet supported Sitting balance-Leahy Scale: Fair     Standing balance support: Bilateral upper extremity supported;During functional activity Standing balance-Leahy Scale: Fair Standing balance comment: UE support and supervision for safety with mobility                            Cognition Arousal/Alertness: Awake/alert Behavior During Therapy: WFL for tasks assessed/performed Overall Cognitive Status: No family/caregiver present to determine baseline cognitive functioning Area of Impairment: Attention;Following commands;Memory;Awareness;Safety/judgement;Problem solving;Orientation                 Orientation Level: Disoriented to;Time Current Attention Level: Selective Memory: Decreased short-term memory Following Commands: Follows one step commands consistently Safety/Judgement: Decreased awareness of deficits Awareness: Emergent Problem Solving: Slow processing;Requires verbal cues        Exercises      General Comments        Pertinent Vitals/Pain Pain Assessment: Faces Faces Pain Scale: Hurts even more Pain Location: back/ribs Pain Descriptors / Indicators: Grimacing;Guarding;Sore Pain Intervention(s): Monitored during session;Premedicated before session  Home Living                      Prior Function            PT Goals (current goals can now be found in the care plan section) Acute Rehab PT Goals Patient Stated Goal: Get back to normal and independence PT Goal Formulation:  With patient Time For Goal Achievement: 03/06/20 Potential to Achieve Goals: Good Progress towards PT goals: Progressing toward goals    Frequency    Min 3X/week      PT Plan Current plan remains appropriate;Frequency needs to be updated    Co-evaluation              AM-PAC PT "6 Clicks" Mobility   Outcome Measure  Help needed turning from your back to your side while in a flat bed without using bedrails?: A Lot Help needed moving from lying on your back to sitting on the side of a flat bed without using bedrails?: A Lot Help needed moving to and from a bed to a chair (including a wheelchair)?: A Little Help needed standing up from a chair using your arms (e.g., wheelchair or bedside chair)?: A Little Help needed to walk in hospital room?: A Little Help needed climbing 3-5 steps with a railing? : A Little 6 Click Score: 16    End of Session Equipment Utilized During Treatment: Gait belt Activity Tolerance: Patient tolerated treatment well;Patient limited by pain Patient left: with nursing/sitter in room Nurse Communication: Mobility status PT Visit Diagnosis: Other abnormalities of gait and mobility (R26.89);Pain;History of falling (Z91.81) Pain - Right/Left: Left Pain - part of body:  (trunk ( ribs))     Time: 1127-1150 PT Time Calculation (min) (ACUTE ONLY): 23 min  Charges:  $Gait Training: 23-37 mins                     Nickolette Espinola, PT, GCS 02/24/20,12:17 PM

## 2020-02-24 NOTE — Progress Notes (Signed)
Trauma/Critical Care Follow Up Note  Subjective:    Overnight Issues:  NAEO. C/o L chest pain with inspiration - improves with medication. Tolerating PO - ate 100% breakfast. Pulling 1500 cc on IS. States he is mobilizing to the bathroom. States he is about to move to an apartment "for older people" that he and his son found. His son lives in [place]. No reported SOB or urinary sxs.  Patient unsure if he wants CIR, thinks he would rather go home at this time.  Objective:  Vital signs for last 24 hours: Temp:  [98.2 F (36.8 C)-98.5 F (36.9 C)] 98.3 F (36.8 C) (06/22 0447) Pulse Rate:  [60-63] 63 (06/22 0447) Resp:  [15-19] 15 (06/22 0447) BP: (132-139)/(70-75) 134/70 (06/22 0447) SpO2:  [92 %-94 %] 93 % (06/22 0447)  Hemodynamic parameters for last 24 hours:    Intake/Output from previous day: 06/21 0701 - 06/22 0700 In: 910 [P.O.:910] Out: 925 [Urine:925]  Intake/Output this shift: No intake/output data recorded.  Vent settings for last 24 hours:    Physical Exam:  Gen: comfortable, no distress Neuro: non-focal exam HEENT: PERRL Neck: supple CV: RRR Pulm: unlabored breathing on 2L [place], CTAB Abd: soft, NT Extr: wwp, no edema   Results for orders placed or performed during the hospital encounter of 02/20/20 (from the past 24 hour(s))  Glucose, capillary     Status: Abnormal   Collection Time: 02/23/20 12:50 PM  Result Value Ref Range   Glucose-Capillary 123 (H) 70 - 99 mg/dL  Glucose, capillary     Status: Abnormal   Collection Time: 02/23/20  4:41 PM  Result Value Ref Range   Glucose-Capillary 147 (H) 70 - 99 mg/dL  Glucose, capillary     Status: None   Collection Time: 02/23/20  9:18 PM  Result Value Ref Range   Glucose-Capillary 98 70 - 99 mg/dL  Protime-INR     Status: None   Collection Time: 02/24/20  3:39 AM  Result Value Ref Range   Prothrombin Time 14.8 11.4 - 15.2 seconds   INR 1.2 0.8 - 1.2  CBC     Status: Abnormal   Collection Time: 02/24/20   3:39 AM  Result Value Ref Range   WBC 8.5 4.0 - 10.5 K/uL   RBC 3.76 (L) 4.22 - 5.81 MIL/uL   Hemoglobin 11.4 (L) 13.0 - 17.0 g/dL   HCT 35.1 (L) 39 - 52 %   MCV 93.4 80.0 - 100.0 fL   MCH 30.3 26.0 - 34.0 pg   MCHC 32.5 30.0 - 36.0 g/dL   RDW 14.2 11.5 - 15.5 %   Platelets 130 (L) 150 - 400 K/uL   nRBC 0.0 0.0 - 0.2 %  Basic metabolic panel     Status: Abnormal   Collection Time: 02/24/20  3:39 AM  Result Value Ref Range   Sodium 136 135 - 145 mmol/L   Potassium 3.8 3.5 - 5.1 mmol/L   Chloride 102 98 - 111 mmol/L   CO2 25 22 - 32 mmol/L   Glucose, Bld 98 70 - 99 mg/dL   BUN 24 (H) 8 - 23 mg/dL   Creatinine, Ser 1.30 (H) 0.61 - 1.24 mg/dL   Calcium 8.3 (L) 8.9 - 10.3 mg/dL   GFR calc non Af Amer 56 (L) >60 mL/min   GFR calc Af Amer >60 >60 mL/min   Anion gap 9 5 - 15  Glucose, capillary     Status: None   Collection Time: 02/24/20  6:44 AM  Result Value Ref Range   Glucose-Capillary 85 70 - 99 mg/dL    Assessment & Plan:  Present on Admission: . SDH (subdural hematoma) (HCC)    LOS: 4 days   Additional comments:I reviewed the patient's new clinical lab test results.   and I reviewed the patients new imaging test results.    Fall  Left ribs 5-8 fracture - pain control, IS, mobilization, therapies Small left subdural hematoma - NSGY c/s, no intervention, keppra x7d. Ok to restart anticoagulation given 48 hrs since fall DM - resume home meds, SSI Chronic pain - home oxy 15mg  and tramadol  HTN - resume home meds Depression - resume zoloft A fib - resumed coumadin, pharmacy to dose CAD - resumed amio and other cardiac home meds FEN - carb mod/HH diet, restarted home lasix VTE - restarted coumadin, trend INR, SCDs ID - none  Dispo - transfer to the floor, PT/OT, wean O2  recs for CIR from OT/ST/PT on 6/21  7/21, PA-C  Trauma & General Surgery Please use [email or web address] to contact on call provider  02/24/2020  *Care during the described time interval was  provided by me. I have reviewed this patient's available data, including medical history, events of note, physical examination and test results as part of my evaluation.

## 2020-02-24 NOTE — Progress Notes (Signed)
PT Cancellation Note  Patient Details Name: Andre Malone MRN: [id number] DOB: 1951-03-13   Cancelled Treatment:    Reason Eval/Treat Not Completed: Fatigue/lethargy limiting ability to participate. Will re-attempt later if time allows.    Timothy Trudell 02/24/2020, 10:44 AM

## 2020-02-25 LAB — GLUCOSE, CAPILLARY
Glucose-Capillary: 81 mg/dL (ref 70–99)
Glucose-Capillary: 80 mg/dL (ref 70–99)
Glucose-Capillary: 98 mg/dL (ref 70–99)
Glucose-Capillary: 98 mg/dL (ref 70–99)

## 2020-02-25 LAB — CBC
HCT: 35.6 % — ABNORMAL LOW (ref 39.0–52.0)
Hemoglobin: 11.7 g/dL — ABNORMAL LOW (ref 13.0–17.0)
MCH: 31.2 pg (ref 26.0–34.0)
MCHC: 32.9 g/dL (ref 30.0–36.0)
MCV: 94.9 fL (ref 80.0–100.0)
Platelets: 150 10*3/uL (ref 150–400)
RBC: 3.75 MIL/uL — ABNORMAL LOW (ref 4.22–5.81)
RDW: 14.2 % (ref 11.5–15.5)
WBC: 7.5 10*3/uL (ref 4.0–10.5)
nRBC: 0 % (ref 0.0–0.2)

## 2020-02-25 LAB — BASIC METABOLIC PANEL
Anion gap: 9 (ref 5–15)
BUN: 30 mg/dL — ABNORMAL HIGH (ref 8–23)
CO2: 25 mmol/L (ref 22–32)
Calcium: 8.7 mg/dL — ABNORMAL LOW (ref 8.9–10.3)
Chloride: 104 mmol/L (ref 98–111)
Creatinine, Ser: 1.39 mg/dL — ABNORMAL HIGH (ref 0.61–1.24)
GFR calc Af Amer: 60 mL/min — ABNORMAL LOW (ref >60)
GFR calc non Af Amer: 52 mL/min — ABNORMAL LOW (ref >60)
Glucose, Bld: 103 mg/dL — ABNORMAL HIGH (ref 70–99)
Potassium: 4 mmol/L (ref 3.5–5.1)
Sodium: 138 mmol/L (ref 135–145)

## 2020-02-25 LAB — PROTIME-INR
INR: 1.3 — ABNORMAL HIGH (ref 0.8–1.2)
Prothrombin Time: 15.7 seconds — ABNORMAL HIGH (ref 11.4–15.2)

## 2020-02-25 MED ORDER — TRAMADOL HCL 50 MG PO TABS
50.0000 mg | ORAL_TABLET | Freq: Four times a day (QID) | ORAL | Status: DC | PRN
  Administered 2020-02-27: 50 mg via ORAL
  Filled 2020-02-25: qty 1

## 2020-02-25 MED ORDER — POLYETHYLENE GLYCOL 3350 17 G PO PACK: 17.0000 g | PACK | Freq: Every day | ORAL | Status: DC | PRN

## 2020-02-25 MED ORDER — WARFARIN SODIUM 7.5 MG PO TABS
7.5000 mg | ORAL_TABLET | Freq: Once | ORAL | Status: AC
  Administered 2020-02-25: 7.5 mg via ORAL
  Filled 2020-02-25: qty 1

## 2020-02-25 MED ORDER — METHOCARBAMOL 500 MG PO TABS
500.0000 mg | ORAL_TABLET | Freq: Four times a day (QID) | ORAL | Status: DC | PRN
  Administered 2020-02-27 – 2020-02-29 (×6): 500 mg via ORAL
  Filled 2020-02-25 (×6): qty 1

## 2020-02-25 NOTE — Plan of Care (Signed)

## 2020-02-25 NOTE — Progress Notes (Addendum)
Subjective: CC: Left ribcage pain No acute changes overnight.  Patient has weaned off O2 and is without hypoxia.  Working on Chiropodist.  Pulling 1750.  Worked well with PT/OT yesterday.  Ambulated 40 feet with RW yesterday, characterized as min guard.  Currently recommended for CIR.  Patient is unsure if he would want to go to CIR but is willing to meet with them to discuss.  Per CM/SW notes, patient's son is in favor of CIR.  Patient states that he is currently in the process of moving to an independent living apartment and has 24/7 support.  He is unsure of the name but notes it [place] in [place].  Patient complains of some left rib cage pain.  No change from yesterday.  Did require Dilaudid x1 yesterday.  Otherwise pain is controlled with home Oxy.  He is tolerating a diet without any abdominal pain, nausea or emesis.  He is passing flatus.  BM yesterday.  Objective: Vital signs in last 24 hours: Temp:  [97.6 F (36.4 C)-98 F (36.7 C)] 97.6 F (36.4 C) (06/23 0319) Pulse Rate:  [59-65] 59 (06/23 0319) Resp:  [16-18] 16 (06/23 0319) BP: (125-154)/(58-69) 154/69 (06/23 0319) SpO2:  [91 %-97 %] 91 % (06/23 0319) Last BM Date: 02/24/20  Intake/Output from previous day: 06/22 0701 - 06/23 0700 In: 600 [P.O.:600] Out: -  Intake/Output this shift: No intake/output data recorded.  PE: Gen: comfortable, no distress Neuro: non-focal exam HEENT: PERRL. EOMI. Bandage to left forehead c/d/i Neck: supple CV: RRR Pulm: Normal rate and effort. On RA. Lungs CTA b/l. Left ribcage pain. Pulling 1750 on IS.  Abd: soft, ND, NT, +BS Extr: wwp, no edema  Lab Results:  Recent Labs    02/24/20 0339 02/25/20 0447  WBC 8.5 7.5  HGB 11.4* 11.7*  HCT 35.1* 35.6*  PLT 130* 150   BMET Recent Labs    02/24/20 0339 02/25/20 0447  NA 136 138  K 3.8 4.0  CL 102 104  CO2 25 25  GLUCOSE 98 103*  BUN 24* 30*  CREATININE 1.30* 1.39*  CALCIUM 8.3*  8.7*   PT/INR Recent Labs    02/24/20 0339 02/25/20 0447  LABPROT 14.8 15.7*  INR 1.2 1.3*   CMP     Component Value Date/Time   NA 138 02/25/2020 0447   NA 141 06/04/2019 1537   K 4.0 02/25/2020 0447   CL 104 02/25/2020 0447   CO2 25 02/25/2020 0447   GLUCOSE 103 (H) 02/25/2020 0447   BUN 30 (H) 02/25/2020 0447   BUN 22 06/04/2019 1537   CREATININE 1.39 (H) 02/25/2020 0447   CREATININE 1.11 01/22/2013 1205   CALCIUM 8.7 (L) 02/25/2020 0447   PROT 7.8 02/20/2020 1640   PROT 7.1 06/04/2019 1537   ALBUMIN 4.0 02/20/2020 1640   ALBUMIN 4.3 06/04/2019 1537   AST 50 (H) 02/20/2020 1640   ALT 24 02/20/2020 1640   ALKPHOS 66 02/20/2020 1640   BILITOT 0.8 02/20/2020 1640   BILITOT 0.5 06/04/2019 1537   GFRNONAA 52 (L) 02/25/2020 0447   GFRNONAA 71 01/22/2013 1205   GFRAA 60 (L) 02/25/2020 0447   GFRAA 82 01/22/2013 1205   Lipase     Component Value Date/Time   LIPASE 64 (H) 03/28/2010 1733     Studies/Results: No results found.  Anti-infectives: Anti-infectives (From admission, onward)   None       Assessment/Plan Fall Left ribs 5-8 fracture-  pain control, IS, mobilization, therapies Small left subdural hematoma-NSGY c/s, no intervention, keppra x7d. Ok to restart anticoagulation. Coumadin dosing per pharmacy. DM- Home meds, SSI Chronic pain- Scheduled Tylenol. Home oxy 15mg  and tramadol. D/c Toradol with uptrending Cr. Will add prn Ultram and Robaxin if more pain control is needed. D/c Dilaudid.  HTN- Home meds Depression- resume zoloft A fib- resumed coumadin, pharmacy to dose. INR 1.3 CAD- resumed amio and other cardiac home meds Elevated Cr - Cr 1.39. AM BMP ABL Anemia - hgb 11.7 and stable  FEN -carb mod/HH diet, restarted home lasix VTE- Coumadin, trend INR, SCDs ID- none  Dispo - PT/OT. CIR consult  POC - Son,   LOS: 5 days    Susy Frizzle , [facility] 02/25/2020, 10:00 AM Please see Amion for pager  number during day hours 7:00am-4:30pm

## 2020-02-25 NOTE — Progress Notes (Signed)
Inpatient Rehab Admissions:  Inpatient Rehab Consult received.  I met with patient at the bedside for rehabilitation assessment and to discuss goals and expectations of an inpatient rehab admission.  He is open to a short stay on CIR, and agreed for me to begin insurance authorization process.  I did note that insurance auth may take up to 72 hours and if he progresses well in the meantime, he may be able to d/c straight to his new apartment.  Will continue to follow.   Signed: Shann Medal, PT, DPT Admissions Coordinator [phone] 02/25/20  11:53 AM

## 2020-02-25 NOTE — Progress Notes (Signed)
ANTICOAGULATION CONSULT NOTE - Follow Up Consult  Pharmacy Consult for Warfarin Indication: atrial fibrillation and mAVR  Allergies  Allergen Reactions  . Apresoline [Hydralazine] Nausea And Vomiting  . Ace Inhibitors Other (See Comments)    cough  . Benadryl [Diphenhydramine Hcl] Other (See Comments)    Makes restless legs worse  . Imdur [Isosorbide Dinitrate] Other (See Comments)    headache  . Metoprolol Other (See Comments)    Kidney failure  . Nsaids Other (See Comments)    REACTION: Currently taking Coumadin  . Valsartan Other (See Comments)    REACTION:shuts down Kidney    Patient Measurements: Height: 5\' 6"  (167.6 cm) Weight: 91.4 kg (201 lb 8 oz) IBW/kg (Calculated) : 63.8   Vital Signs: Temp: 98.3 F (36.8 C) (06/23 1200) Temp Source: Oral (06/23 1200) BP: 142/83 (06/23 1200) Pulse Rate: 63 (06/23 1200)  Labs: Recent Labs    02/23/20 0323 02/23/20 0323 02/24/20 0339 02/25/20 0447  HGB 11.7*   < > 11.4* 11.7*  HCT 35.9*  --  35.1* 35.6*  PLT 108*  --  130* 150  LABPROT 15.4*  --  14.8 15.7*  INR 1.3*  --  1.2 1.3*  CREATININE 1.26*  --  1.30* 1.39*   < > = values in this interval not displayed.    Estimated Creatinine Clearance: 53.8 mL/min (A) (by C-G formula based on SCr of 1.39 mg/dL (H)).  Assessment:  68 yom presenting to [facility] s/p fall found to have 5 mm SDH with ~2 mm midline shift. 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 Prior pharmacist spoke with patient who states [facility] [place] [facility] [place] for [facility] IN[facility] checks. Last visit at family [facility] was 4/30, unable to obtain 5/30 records as the clinic is closed that day. He does not know his PTA warfarin dose but reports he has not had any changes recently. At last [facility] visit warfarin dose was 2.5 mg Tuesday and Thursday, 5 mg all other days. [facility] notes states patient has an INR goal of 2.5-3.5 which is likely because of his mAVR and risk  factor of atrial fibrillation.   Prior pharmacist d/t with Trauma PA for INR goal. Patient with atrial fibrillation and mechanical aortic valve so 2.5-3.5 would be ideal but given his new SDH and EKGs from 6/18 showing no active afib, will aim for INR goal of 2-3.  Please note that patient received 10 mg IV vitamin K 6/18 for warfarin reversal and this may cause warfarin resistance for ~1 week.    INR 1.3 today, D#3 after restart with 5 mg x 1 then 10 mg x 2 days.   Hesitant to continue with 10 mg as would like to slowly increase INR to new low goal and not overshoot.  On Amiodarone 200 mg BID as PTA.  Goal of Therapy:  INR 2-3 Monitor platelets by anticoagulation protocol: Yes   Plan:   Warfarin 7.5 mg x 1 today.  Daily PT/INR.  7/18, RPh Phone:  02/25/2020,12:55 PM

## 2020-02-26 LAB — BASIC METABOLIC PANEL
Anion gap: 10 (ref 5–15)
BUN: 31 mg/dL — ABNORMAL HIGH (ref 8–23)
CO2: 25 mmol/L (ref 22–32)
Calcium: 8.9 mg/dL (ref 8.9–10.3)
Chloride: 104 mmol/L (ref 98–111)
Creatinine, Ser: 1.66 mg/dL — ABNORMAL HIGH (ref 0.61–1.24)
GFR calc Af Amer: 48 mL/min — ABNORMAL LOW (ref >60)
GFR calc non Af Amer: 42 mL/min — ABNORMAL LOW (ref >60)
Glucose, Bld: 91 mg/dL (ref 70–99)
Potassium: 4.3 mmol/L (ref 3.5–5.1)
Sodium: 139 mmol/L (ref 135–145)

## 2020-02-26 LAB — PROTIME-INR
INR: 1.8 — ABNORMAL HIGH (ref 0.8–1.2)
Prothrombin Time: 20.1 seconds — ABNORMAL HIGH (ref 11.4–15.2)

## 2020-02-26 LAB — CBC
HCT: 37.1 % — ABNORMAL LOW (ref 39.0–52.0)
Hemoglobin: 12 g/dL — ABNORMAL LOW (ref 13.0–17.0)
MCH: 30.3 pg (ref 26.0–34.0)
MCHC: 32.3 g/dL (ref 30.0–36.0)
MCV: 93.7 fL (ref 80.0–100.0)
Platelets: 157 10*3/uL (ref 150–400)
RBC: 3.96 MIL/uL — ABNORMAL LOW (ref 4.22–5.81)
RDW: 13.9 % (ref 11.5–15.5)
WBC: 8 10*3/uL (ref 4.0–10.5)
nRBC: 0 % (ref 0.0–0.2)

## 2020-02-26 LAB — GLUCOSE, CAPILLARY
Glucose-Capillary: 154 mg/dL — ABNORMAL HIGH (ref 70–99)
Glucose-Capillary: 74 mg/dL (ref 70–99)
Glucose-Capillary: 90 mg/dL (ref 70–99)
Glucose-Capillary: 81 mg/dL (ref 70–99)

## 2020-02-26 MED ORDER — WARFARIN SODIUM 2.5 MG PO TABS
2.5000 mg | ORAL_TABLET | Freq: Once | ORAL | Status: AC
  Administered 2020-02-26: 2.5 mg via ORAL
  Filled 2020-02-26: qty 1

## 2020-02-26 NOTE — Progress Notes (Signed)
ANTICOAGULATION CONSULT NOTE - Follow Up Consult  Pharmacy Consult for Warfarin Indication: atrial fibrillation and mAVR  Allergies  Allergen Reactions  . Apresoline [Hydralazine] Nausea And Vomiting  . Ace Inhibitors Other (See Comments)    cough  . Benadryl [Diphenhydramine Hcl] Other (See Comments)    Makes restless legs worse  . Imdur [Isosorbide Dinitrate] Other (See Comments)    headache  . Metoprolol Other (See Comments)    Kidney failure  . Nsaids Other (See Comments)    REACTION: Currently taking Coumadin  . Valsartan Other (See Comments)    REACTION:shuts down Kidney    Patient Measurements: Height: 5\' 6"  (167.6 cm) Weight: 91.4 kg (201 lb 8 oz) IBW/kg (Calculated) : 63.8   Vital Signs: Temp: 98.5 F (36.9 C) (06/24 0848) Temp Source: Oral (06/24 0848) BP: 138/64 (06/24 0848) Pulse Rate: 61 (06/24 0848)  Labs: Recent Labs    02/24/20 0339 02/24/20 0339 02/25/20 0447 02/26/20 0414  HGB 11.4*   < > 11.7* 12.0*  HCT 35.1*  --  35.6* 37.1*  PLT 130*  --  150 157  LABPROT 14.8  --  15.7* 20.1*  INR 1.2  --  1.3* 1.8*  CREATININE 1.30*  --  1.39* 1.66*   < > = values in this interval not displayed.    Estimated Creatinine Clearance: 45.1 mL/min (A) (by C-G formula based on SCr of 1.66 mg/dL (H)).  Assessment:  68 yom presenting [facility]/p fall found to have 5 mm SDH with ~2 mm midline shift. 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 Prior pharmacist spoke with patient who states [facility] [place] [facility] INR checks. Last visit at family [facility] was 4/30, unable to obtain 5/30 records as the clinic is closed that day. He does not know his PTA warfarin dose but reports he has not had any changes recently. At last [facility] visit warfarin dose was 2.5 mg Tuesday and Thursday, 5 mg all other days. [facility] notes states patient has an INR goal of 2.5-3.5 which is likely because of his mAVR and risk  factor of atrial fibrillation.   Prior pharmacist d/t with Trauma PA for INR goal. Patient with atrial fibrillation and mechanical aortic valve so 2.5-3.5 would be ideal but given his new SDH and EKGs from 6/18 showing no active afib, will aim for INR goal of 2-3.  Please note that patient received 10 mg IV vitamin K 6/18 for warfarin reversal and this may cause warfarin resistance for ~1 week.    INR 1.2-1.3 for 4 days and up to 1.8 today, D#4 after restart with 5 mg x 1, then 10 mg x 2 days then 7.5 mg yesterday. Expect INR to rise again tomorrow. Would like to slowly increase INR to new low goal and not overshoot.  On Amiodarone 200 mg BID as PTA.  Goal of Therapy:  INR 2-3 Monitor platelets by anticoagulation protocol: Yes   Plan:   Decrease Warfarin to 2.5 mg x 1 today.  Daily PT/INR.  7/18, RPh Phone:  02/26/2020,12:34 PM

## 2020-02-26 NOTE — Progress Notes (Addendum)
Subjective: CC: Left ribcage pain NAEO. Pain overall controlled. Pulling 1750 on IS. Tolerating 100% of meals. Voiding without difficulty and having BMs. Remains agreeable to CIR pending insurance authorization.   Mobilized in the hall with PT yesterday.  Objective: Vital signs in last 24 hours: Temp:  [98.2 F (36.8 C)-98.6 F (37 C)] 98.5 F (36.9 C) (06/24 0848) Pulse Rate:  [61-63] 61 (06/24 0848) Resp:  [18] 18 (06/23 2137) BP: (134-176)/(62-83) 138/64 (06/24 0848) SpO2:  [90 %-98 %] 96 % (06/24 0848) Last BM Date: 02/24/20  Intake/Output from previous day: 06/23 0701 - 06/24 0700 In: -  Out: 725 [Urine:725] Intake/Output this shift: No intake/output data recorded.  PE: Gen: comfortable, no distress Neuro: non-focal exam HEENT: PERRL. EOMI. Bandage to left forehead c/d/i Neck: supple CV: RRR Pulm: Normal rate and effort. On RA. Lungs CTA b/l. Left ribcage pain.  Abd: soft, ND, NT, +BS GU: external urinary cath in place, clear yellow urine Extr: wwp, no edema  Lab Results:  Recent Labs    02/25/20 0447 02/26/20 0414  WBC 7.5 8.0  HGB 11.7* 12.0*  HCT 35.6* 37.1*  PLT 150 157   BMET Recent Labs    02/25/20 0447 02/26/20 0414  NA 138 139  K 4.0 4.3  CL 104 104  CO2 25 25  GLUCOSE 103* 91  BUN 30* 31*  CREATININE 1.39* 1.66*  CALCIUM 8.7* 8.9   PT/INR Recent Labs    02/25/20 0447 02/26/20 0414  LABPROT 15.7* 20.1*  INR 1.3* 1.8*   CMP     Component Value Date/Time   NA 139 02/26/2020 0414   NA 141 06/04/2019 1537   K 4.3 02/26/2020 0414   CL 104 02/26/2020 0414   CO2 25 02/26/2020 0414   GLUCOSE 91 02/26/2020 0414   BUN 31 (H) 02/26/2020 0414   BUN 22 06/04/2019 1537   CREATININE 1.66 (H) 02/26/2020 0414   CREATININE 1.11 01/22/2013 1205   CALCIUM 8.9 02/26/2020 0414   PROT 7.8 02/20/2020 1640   PROT 7.1 06/04/2019 1537   ALBUMIN 4.0 02/20/2020 1640   ALBUMIN 4.3 06/04/2019 1537   AST 50 (H) 02/20/2020 1640   ALT 24  02/20/2020 1640   ALKPHOS 66 02/20/2020 1640   BILITOT 0.8 02/20/2020 1640   BILITOT 0.5 06/04/2019 1537   GFRNONAA 42 (L) 02/26/2020 0414   GFRNONAA 71 01/22/2013 1205   GFRAA 48 (L) 02/26/2020 0414   GFRAA 82 01/22/2013 1205   Lipase     Component Value Date/Time   LIPASE 64 (H) 03/28/2010 1733     Studies/Results: No results found.  Anti-infectives: Anti-infectives (From admission, onward)   None       Assessment/Plan Fall Left ribs 5-8 fracture- pain control, IS, mobilization, therapies Small left subdural hematoma-NSGY c/s, no intervention, keppra x7d. Ok to restart anticoagulation. Coumadin dosing per pharmacy. DM- Home meds, SSI Chronic pain- Scheduled Tylenol. Home oxy 15mg  and tramadol. D/c Toradol 6/23 with uptrending Cr. Will add prn Ultram and Robaxin if more pain control is needed. D/c Dilaudid.  HTN- Home meds Depression- resume zoloft A fib- resumed coumadin, pharmacy to dose. INR 1.8 CAD- resumed amio and other cardiac home meds Elevated Cr - Cr 1.66 from 1.33, BUN remains stable 30. UOP 725 cc/24h; hold lasix today. toradol D/C-ed yesterday. Encourage PO hydration ABL Anemia - hgb 11.7 and stable  FEN -carb mod/HH diet, restarted home lasix VTE- Coumadin, trend INR, SCDs ID- none  Dispo -  PT/OT. CIR pending insurance auth. Encourage PO hydration today POC - Son, [place]   LOS: 6 days    Jill Alexanders , [facility] Surgery 02/26/2020, 10:43 AM Please see Amion for pager number during day hours 7:00am-4:30pm

## 2020-02-26 NOTE — Progress Notes (Signed)
Physical Therapy Treatment Patient Details Name: Andre Malone MRN: [id number] DOB: 01/14/1951 Today's Date: 02/26/2020    History of Present Illness 69 yo male was carrying wood and tools down stairs when he missed his footing and fell.  Workup revealed  very small SDH and rib fx 5-8. PMH significant for blind in Right eye, CAD, s/p AVR and pacemaker, DM, HTN    PT Comments    Pt slow to mobilize and remains guarded due to pain.  He continues to progress slowly.  Based on slow gt speed and risk for recurrent falls, he would benefit from skilled rehab at CIR to improve strength and function before returning home.    Follow Up Recommendations  CIR     Equipment Recommendations  Other (comment) (TBD)    Recommendations for Other Services       Precautions / Restrictions Precautions Precautions: Fall Precaution Comments: Rib fractures Restrictions Weight Bearing Restrictions: No    Mobility  Bed Mobility Overal bed mobility: Needs Assistance Bed Mobility: Supine to Sit;Rolling;Sit to Sidelying Rolling: Min assist   Supine to sit: Min assist   Sit to sidelying: Min assist General bed mobility comments: Pt with decreased pain utilizing log rolling and hugging of pillow  Transfers Overall transfer level: Needs assistance Equipment used: Rolling walker (2 wheeled) Transfers: Sit to/from Stand Sit to Stand: Min guard;From elevated surface         General transfer comment: Continues to require elevated surface to rise into standing.  Ambulation/Gait Ambulation/Gait assistance: Min guard Gait Distance (Feet): 120 Feet Assistive device: Rolling walker (2 wheeled) Gait Pattern/deviations: Step-through pattern;Decreased stride length;Shuffle Gait velocity: decr   General Gait Details: Several standing rest breaks due to pain in L knee and L ribcage.  Cues for upper trunk control and RW safety.  Poor safety with RW use.   Stairs             Wheelchair  Mobility    Modified Rankin (Stroke Patients Only)       Balance Overall balance assessment: Needs assistance   Sitting balance-Leahy Scale: Fair       Standing balance-Leahy Scale: Fair                              Cognition Arousal/Alertness: Awake/alert Behavior During Therapy: WFL for tasks assessed/performed Overall Cognitive Status: Within Functional Limits for tasks assessed                                 General Comments: Did not formally assess cognition but within functional limits for tasks assessed.      Exercises      General Comments        Pertinent Vitals/Pain Pain Assessment: 0-10 Pain Score: 9  Faces Pain Scale: Hurts whole lot Pain Location: back/ribs, reports 4/10 in L knee. Pain Descriptors / Indicators: Grimacing;Guarding;Sore Pain Intervention(s): Monitored during session;Repositioned    Home Living                      Prior Function            PT Goals (current goals can now be found in the care plan section) Acute Rehab PT Goals Patient Stated Goal: Get back to normal and independence Potential to Achieve Goals: Good Progress towards PT goals: Progressing toward goals    Frequency  Min 3X/week      PT Plan Current plan remains appropriate    Co-evaluation              AM-PAC PT "6 Clicks" Mobility   Outcome Measure  Help needed turning from your back to your side while in a flat bed without using bedrails?: A Little Help needed moving from lying on your back to sitting on the side of a flat bed without using bedrails?: A Little Help needed moving to and from a bed to a chair (including a wheelchair)?: A Little Help needed standing up from a chair using your arms (e.g., wheelchair or bedside chair)?: A Little Help needed to walk in hospital room?: A Little Help needed climbing 3-5 steps with a railing? : A Little 6 Click Score: 18    End of Session Equipment Utilized  During Treatment: Gait belt Activity Tolerance: Patient tolerated treatment well;Patient limited by pain Patient left: in bed;with call bell/phone within reach;with bed alarm set Nurse Communication: Mobility status (emptied 1200 from cath bag) PT Visit Diagnosis: Other abnormalities of gait and mobility (R26.89);Pain;History of falling (Z91.81) Pain - Right/Left: Left     Time: 3570-1779 PT Time Calculation (min) (ACUTE ONLY): 38 min  Charges:  $Gait Training: 8-22 mins $Therapeutic Activity: 23-37 mins                     Erasmo Leventhal , PTA Acute Rehabilitation Services Pager [phone] Office [phone]     Vaeda Westall Eli Hose 02/26/2020, 4:16 PM

## 2020-02-26 NOTE — Progress Notes (Signed)
Inpatient Rehab Admissions Coordinator:   Spoke with pt's son over the phone re: CIR recommendations and insurance auth process.  Will continue to follow for timing of potential rehab admission pending insurance auth, bed availability, and whether or not pt still meets functional criteria at that time.   Estill Dooms, PT, DPT Admissions Coordinator [phone] 02/26/20  3:01 PM

## 2020-02-26 NOTE — Plan of Care (Signed)

## 2020-02-27 LAB — BASIC METABOLIC PANEL
Anion gap: 9 (ref 5–15)
BUN: 27 mg/dL — ABNORMAL HIGH (ref 8–23)
CO2: 27 mmol/L (ref 22–32)
Calcium: 9 mg/dL (ref 8.9–10.3)
Chloride: 101 mmol/L (ref 98–111)
Creatinine, Ser: 1.46 mg/dL — ABNORMAL HIGH (ref 0.61–1.24)
GFR calc Af Amer: 56 mL/min — ABNORMAL LOW (ref >60)
GFR calc non Af Amer: 49 mL/min — ABNORMAL LOW (ref >60)
Glucose, Bld: 80 mg/dL (ref 70–99)
Potassium: 4.1 mmol/L (ref 3.5–5.1)
Sodium: 137 mmol/L (ref 135–145)

## 2020-02-27 LAB — GLUCOSE, CAPILLARY
Glucose-Capillary: 82 mg/dL (ref 70–99)
Glucose-Capillary: 99 mg/dL (ref 70–99)
Glucose-Capillary: 111 mg/dL — ABNORMAL HIGH (ref 70–99)
Glucose-Capillary: 96 mg/dL (ref 70–99)

## 2020-02-27 LAB — PROTIME-INR
INR: 2.5 — ABNORMAL HIGH (ref 0.8–1.2)
Prothrombin Time: 26.3 seconds — ABNORMAL HIGH (ref 11.4–15.2)

## 2020-02-27 MED ORDER — WARFARIN SODIUM 2.5 MG PO TABS
2.5000 mg | ORAL_TABLET | Freq: Once | ORAL | Status: AC
  Administered 2020-02-27: 2.5 mg via ORAL
  Filled 2020-02-27: qty 1

## 2020-02-27 NOTE — Progress Notes (Addendum)
Subjective: CC: Reports poor sleep overnight 2/2 left rib pain. States that he "twisted" last night in the bed causing pain and after that wasn't able to get much rest. Reports urinating a lot yesterday. Tolerating PO without N/V. No other complaints. States that at home he does not take lasix daily, he takes it when he gets swelling or short of breath.   Mobilized in the hall with PT yesterday.  Objective: Vital signs in last 24 hours: Temp:  [97.7 F (36.5 C)-98.8 F (37.1 C)] 97.7 F (36.5 C) (06/25 0450) Pulse Rate:  [61-70] 65 (06/25 0450) Resp:  [18] 18 (06/25 0450) BP: (131-148)/(64-77) 148/77 (06/25 0450) SpO2:  [93 %-96 %] 93 % (06/25 0450) Last BM Date: 02/24/20  Intake/Output from previous day: 06/24 0701 - 06/25 0700 In: -  Out: 1800 [Urine:1800] Intake/Output this shift: No intake/output data recorded.  PE: Gen: comfortable, no distress Neuro: non-focal exam HEENT: PERRL. EOMI. Bandage to left forehead c/d/i Neck: supple CV: RRR Pulm: Normal rate and effort. On RA. Lungs CTA b/l. Left ribcage pain.  Abd: soft, ND, NT, +BS GU: external urinary cath in place, clear yellow urine Extr: wwp, no edema  Lab Results:  Recent Labs    02/25/20 0447 02/26/20 0414  WBC 7.5 8.0  HGB 11.7* 12.0*  HCT 35.6* 37.1*  PLT 150 157   BMET Recent Labs    02/26/20 0414 02/27/20 0330  NA 139 137  K 4.3 4.1  CL 104 101  CO2 25 27  GLUCOSE 91 80  BUN 31* 27*  CREATININE 1.66* 1.46*  CALCIUM 8.9 9.0   PT/INR Recent Labs    02/26/20 0414 02/27/20 0330  LABPROT 20.1* 26.3*  INR 1.8* 2.5*   CMP     Component Value Date/Time   NA 137 02/27/2020 0330   NA 141 06/04/2019 1537   K 4.1 02/27/2020 0330   CL 101 02/27/2020 0330   CO2 27 02/27/2020 0330   GLUCOSE 80 02/27/2020 0330   BUN 27 (H) 02/27/2020 0330   BUN 22 06/04/2019 1537   CREATININE 1.46 (H) 02/27/2020 0330   CREATININE 1.11 01/22/2013 1205   CALCIUM 9.0 02/27/2020 0330   PROT 7.8  02/20/2020 1640   PROT 7.1 06/04/2019 1537   ALBUMIN 4.0 02/20/2020 1640   ALBUMIN 4.3 06/04/2019 1537   AST 50 (H) 02/20/2020 1640   ALT 24 02/20/2020 1640   ALKPHOS 66 02/20/2020 1640   BILITOT 0.8 02/20/2020 1640   BILITOT 0.5 06/04/2019 1537   GFRNONAA 49 (L) 02/27/2020 0330   GFRNONAA 71 01/22/2013 1205   GFRAA 56 (L) 02/27/2020 0330   GFRAA 82 01/22/2013 1205   Lipase     Component Value Date/Time   LIPASE 64 (H) 03/28/2010 1733   Studies/Results: No results found.  Anti-infectives: Anti-infectives (From admission, onward)   None       Assessment/Plan Fall Left ribs 5-8 fracture- pain control, IS, mobilization, therapies Small left subdural hematoma-NSGY c/s, no intervention, keppra x7d. Ok to restart anticoagulation. Coumadin dosing per pharmacy. DM- Home meds, SSI Chronic pain- Scheduled Tylenol. Home oxy 15mg  and tramadol. D/c Toradol 6/23 with uptrending Cr. Will add prn Ultram and Robaxin if more pain control is needed. D/c Dilaudid.  HTN- Home meds Depression- resume zoloft A fib- resumed coumadin, pharmacy to dose. INR 2.5 CAD- resumed amio and other cardiac home meds Elevated Cr - Cr 1.46 from 1.66 (improved), BUN remains stable 27. UOP 1800 cc/24h; hold lasix  today. toradol D/C-ed 6/23. Encourage PO hydration ABL Anemia - hgb 11.7 and stable  FEN -carb mod/HH diet, restarted home lasix VTE- Coumadin, trend INR, SCDs ID- none  Dispo - PT/OT. Patient is medically stable for discharge to CIR (pending insurance auth./bed availability), Encourage PO hydration and hold lasix today.  POC - Son, [place]   LOS: 7 days    Jill Alexanders , [facility] 02/27/2020, 8:12 AM Please see Amion for pager number during day hours 7:00am-4:30pm

## 2020-02-27 NOTE — Progress Notes (Signed)
  Speech Language Pathology Treatment: Cognitive-Linquistic  Patient Details Name: Andre Malone MRN: [id number] DOB: August 03, 1951 Today's Date: 02/27/2020 Time: 4920-1007 SLP Time Calculation (min) (ACUTE ONLY): 23 min  Assessment / Plan / Recommendation Clinical Impression  Pt has made improvements since initial cognitive evaluation, but continues to have mild difficulties with delayed recall. He is retaining general information, but having more trouble with specific details. Min cues were provided for recall during delayed recall task as well as simple medication management task. Min cues were also given for anticipatory awareness and verbal problem solving related to safety. He will benefit from cognitive therapy acutely and at next level of care.    HPI HPI: 69 yo male was carrying wood and tools down stairs when he missed his footing and fell.  Workup revealed SDH and rib fx 5-8. PMH significant for blind in Right eye, CAD, s/p AVR and pacemaker, DM, HTN      SLP Plan  Continue with current plan of care       Recommendations                   Follow up Recommendations: Inpatient Rehab SLP Visit Diagnosis: Cognitive communication deficit (H21.975) Plan: Continue with current plan of care       GO                Mahala Menghini., M.A. CCC-SLP Acute Rehabilitation Services Pager [phone] Office [phone]  02/27/2020, 4:16 PM

## 2020-02-27 NOTE — Progress Notes (Signed)
ANTICOAGULATION CONSULT NOTE - Follow Up Consult  Pharmacy Consult for Warfarin Indication: atrial fibrillation and mAVR  Allergies  Allergen Reactions  . Apresoline [Hydralazine] Nausea And Vomiting  . Ace Inhibitors Other (See Comments)    cough  . Benadryl [Diphenhydramine Hcl] Other (See Comments)    Makes restless legs worse  . Imdur [Isosorbide Dinitrate] Other (See Comments)    headache  . Metoprolol Other (See Comments)    Kidney failure  . Nsaids Other (See Comments)    REACTION: Currently taking Coumadin  . Valsartan Other (See Comments)    REACTION:shuts down Kidney    Patient Measurements: Height: 5\' 6"  (167.6 cm) Weight: 91.4 kg (201 lb 8 oz) IBW/kg (Calculated) : 63.8   Vital Signs: Temp: 97.9 F (36.6 C) (06/25 0823) Temp Source: Oral (06/25 0823) BP: 135/57 (06/25 0823) Pulse Rate: 63 (06/25 0823)  Labs: Recent Labs    02/25/20 0447 02/26/20 0414 02/27/20 0330  HGB 11.7* 12.0*  --   HCT 35.6* 37.1*  --   PLT 150 157  --   LABPROT 15.7* 20.1* 26.3*  INR 1.3* 1.8* 2.5*  CREATININE 1.39* 1.66* 1.46*    Estimated Creatinine Clearance: 51.2 mL/min (A) (by C-G formula based on SCr of 1.46 mg/dL (H)).  Assessment:  68 yom presenting to [facility] s/p fall found to have 5 mm SDH with ~2 mm midline shift. 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 Prior pharmacist spoke with patient who states [facility] [place] [facility] [place] for OP INR checks. Last visit at [facility] was 4/30, unable to obtain 5/30 records as the clinic is closed that day. He does not know his PTA warfarin dose but reports he has not had any changes recently. At last [facility] visit warfarin dose was 2.5 mg Tuesday and Thursday, 5 mg all other days. [facility] notes states patient has an INR goal of 2.5-3.5 which is likely because of his mAVR and risk factor of atrial fibrillation.   Prior pharmacist d/t with Trauma PA for INR goal. Patient with  atrial fibrillation and mechanical aortic valve so 2.5-3.5 would be ideal but given his new SDH and EKGs from 6/18 showing no active afib, will aim for INR goal of 2-3.  Please note that patient received 10 mg IV vitamin K 6/18 for warfarin reversal and this may cause warfarin resistance for ~1 week.    INR 1.2-1.3 for 4 days then 1.8 > 2.5. Now at goal, D#5 after restart with 5 mg x 1, then 10 mg x 2 days, then 7.5 mg x 1 and 2.5 mg x 1. Expect INR to start to level off.  Plan was to slowly increase INR to new low goal and try not to overshoot.  On Amiodarone 200 mg BID as PTA.  Goal of Therapy:  INR 2-3 Monitor platelets by anticoagulation protocol: Yes   Plan:   Repeat Warfarin to 2.5 mg x 1 today.  Daily PT/INR.  INR goal now 2-3 (was 2.5-3.5 prior to admission)    7/18, RPh Phone:  02/27/2020,12:56 PM

## 2020-02-27 NOTE — Plan of Care (Signed)

## 2020-02-27 NOTE — Progress Notes (Signed)
Physical Therapy Treatment Patient Details Name: Andre Malone MRN: [id number] DOB: [birth year] Today's Date: 02/27/2020    History of Present Illness 69 yo male was carrying wood and tools down stairs when he missed his footing and fell.  Workup revealed  very small SDH and rib fx 5-8. PMH significant for blind in Right eye, CAD, s/p AVR and pacemaker, DM, HTN    PT Comments    Pt supine in bed on arrival.  He appears uncomfortable and unable to reach his tray to eat dinner.  Performed functional mobility and gt training.  He continues to require external assistance to move to edge of bed.  Pt is progressing gt well.  He remains limited due to rib cage pain on L side.  Based on his slow mobility and continued need for assistance he remains appropriate for aggressive rehab at CIR to maximize functional gains before returning home.     Follow Up Recommendations  [facility]     Equipment Recommendations  Other (comment) (TBD)    Recommendations for Other Services       Precautions / Restrictions Precautions Precautions: Fall Precaution Comments: Rib fractures    Mobility  Bed Mobility Overal bed mobility: Needs Assistance Bed Mobility: Rolling;Sidelying to Sit;Sit to Sidelying Rolling: Min assist Sidelying to sit: Mod assist     Sit to sidelying: Min assist General bed mobility comments: Increased assistance to rise into sitting and reduce pain.  pt splinting ribs with pillow on his L side.  Transfers Overall transfer level: Needs assistance Equipment used: Rolling walker (2 wheeled) Transfers: Sit to/from Stand Sit to Stand: Min guard         General transfer comment: Continues to require elevated surface to rise into standing.  Ambulation/Gait Ambulation/Gait assistance: Min guard Gait Distance (Feet): 150 Feet Assistive device: Rolling walker (2 wheeled) Gait Pattern/deviations: Step-through pattern;Decreased stride length;Shuffle Gait velocity: decr   General  Gait Details: Several standing rest breaks due to pain in L knee and L ribcage.  Cues for upper trunk control and RW safety.  Poor safety with RW use.   Stairs             Wheelchair Mobility    Modified Rankin (Stroke Patients Only)       Balance Overall balance assessment: Needs assistance Sitting-balance support: Feet supported Sitting balance-Leahy Scale: Fair     Standing balance support: Bilateral upper extremity supported;During functional activity Standing balance-Leahy Scale: Fair                              Cognition Arousal/Alertness: Awake/alert Behavior During Therapy: WFL for tasks assessed/performed Overall Cognitive Status: Within Functional Limits for tasks assessed Area of Impairment: Problem solving                             Problem Solving: Slow processing;Requires verbal cues        Exercises      General Comments        Pertinent Vitals/Pain Pain Assessment: 0-10 Pain Score: 7  Pain Location: back/ribs, reports 3/10 in L knee. Pain Descriptors / Indicators: Grimacing;Guarding;Sore Pain Intervention(s): Monitored during session;Repositioned    Home Living                      Prior Function            PT Goals (current goals  can now be found in the care plan section) Acute Rehab PT Goals Patient Stated Goal: Get back to normal and independence Potential to Achieve Goals: Good Progress towards PT goals: Progressing toward goals    Frequency    Min 3X/week      PT Plan Current plan remains appropriate    Co-evaluation              AM-PAC PT "6 Clicks" Mobility   Outcome Measure  Help needed turning from your back to your side while in a flat bed without using bedrails?: A Little Help needed moving from lying on your back to sitting on the side of a flat bed without using bedrails?: A Lot Help needed moving to and from a bed to a chair (including a wheelchair)?: A Little Help  needed standing up from a chair using your arms (e.g., wheelchair or bedside chair)?: A Little Help needed to walk in hospital room?: A Little Help needed climbing 3-5 steps with a railing? : A Little 6 Click Score: 17    End of Session Equipment Utilized During Treatment: Gait belt Activity Tolerance: Patient tolerated treatment well;Patient limited by pain Patient left: in bed;with call bell/phone within reach;with bed alarm set Nurse Communication: Mobility status (assisted with bathing and empting foley, informed nursing to place tele and condom cath.) PT Visit Diagnosis: Other abnormalities of gait and mobility (R26.89);Pain;History of falling (Z91.81) Pain - Right/Left: Left Pain - part of body:  (ribs)     Time: 1625-1700 PT Time Calculation (min) (ACUTE ONLY): 35 min  Charges:  $Gait Training: 8-22 mins $Therapeutic Activity: 8-22 mins                     Andre Malone , PTA Acute Rehabilitation Services Pager [phone] Office [phone]     Andre Malone 02/27/2020, 5:12 PM

## 2020-02-27 NOTE — Discharge Instructions (Addendum)
Information on my medicine - Coumadin   (Warfarin)  Why was Coumadin prescribed for you? Coumadin was prescribed for you because you have a blood clot or a medical condition that can cause an increased risk of forming blood clots. Blood clots can cause serious health problems by blocking the flow of blood to the heart, lung, or brain. Coumadin can prevent harmful blood clots from forming. As a reminder your indication for Coumadin is:   Blood Clot Prevention After Heart Valve Surgery and stroke prevention because of atrial fibrillation  What test will check on my response to Coumadin? While on Coumadin (warfarin) you will need to have an INR test regularly to ensure that your dose is keeping you in the desired range. The INR (international normalized ratio) number is calculated from the result of the laboratory test called prothrombin time (PT).  If an INR APPOINTMENT HAS NOT ALREADY BEEN MADE FOR YOU please schedule an appointment to have this lab work done by your health care provider within 7 days. Your INR goal was previously 2.5-3.5, but is currently 2 to 3.  Your provider may give you a more narrow range like 2-2.5, or change the target range based on multiple factors including bleeding risk.  Ask your health care provider during an office visit what your goal INR is.  What  do you need to  know  About  COUMADIN? Take Coumadin (warfarin) exactly as prescribed by your healthcare provider about the same time each day.  DO NOT stop taking without talking to the doctor who prescribed the medication.  Stopping without other blood clot prevention medication to take the place of Coumadin may increase your risk of developing a new clot or stroke.  Get refills before you run out.  What do you do if you miss a dose? If you miss a dose, take it as soon as you remember on the same day then continue your regularly scheduled regimen the next day.  Do not take two doses of Coumadin at the same  time.  Important Safety Information A possible side effect of Coumadin (Warfarin) is an increased risk of bleeding. You should call your healthcare provider right away if you experience any of the following: ? Bleeding from an injury or your nose that does not stop. ? Unusual colored urine (red or dark brown) or unusual colored stools (red or black). ? Unusual bruising for unknown reasons. ? A serious fall or if you hit your head (even if there is no bleeding).  Some foods or medicines interact with Coumadin (warfarin) and might alter your response to warfarin. To help avoid this: ? Eat a balanced diet, maintaining a consistent amount of Vitamin K. ? Notify your provider about major diet changes you plan to make. ? Avoid alcohol or limit your intake to 1 drink for women and 2 drinks for men per day. (1 drink is 5 oz. wine, 12 oz. beer, or 1.5 oz. liquor.)  Make sure that ANY health care provider who prescribes medication for you knows that you are taking Coumadin (warfarin).  Also make sure the healthcare provider who is monitoring your Coumadin knows when you have started a new medication including herbals and non-prescription products.  Coumadin (Warfarin)  Major Drug Interactions  Increased Warfarin Effect Decreased Warfarin Effect  Alcohol (large quantities) Antibiotics (esp. Septra/Bactrim, Flagyl, Cipro) Amiodarone (Cordarone) Aspirin (ASA) Cimetidine (Tagamet) Megestrol (Megace) NSAIDs (ibuprofen, naproxen, etc.) Piroxicam (Feldene) Propafenone (Rythmol SR) Propranolol (Inderal) Isoniazid (INH) Posaconazole (Noxafil) Barbiturates (Phenobarbital)  Carbamazepine (Tegretol) Chlordiazepoxide (Librium) Cholestyramine (Questran) Griseofulvin Oral Contraceptives Rifampin Sucralfate (Carafate) Vitamin K   Coumadin (Warfarin) Major Herbal Interactions  Increased Warfarin Effect Decreased Warfarin Effect  Garlic Ginseng Ginkgo biloba Coenzyme Q10 Green tea St. John's wort     Coumadin (Warfarin) FOOD Interactions  Eat a consistent number of servings per week of foods HIGH in Vitamin K (1 serving =  cup)  Collards (cooked, or boiled & drained) Kale (cooked, or boiled & drained) Mustard greens (cooked, or boiled & drained) Parsley *serving size only =  cup Spinach (cooked, or boiled & drained) Swiss chard (cooked, or boiled & drained) Turnip greens (cooked, or boiled & drained)  Eat a consistent number of servings per week of foods MEDIUM-HIGH in Vitamin K (1 serving = 1 cup)  Asparagus (cooked, or boiled & drained) Broccoli (cooked, boiled & drained, or raw & chopped) Brussel sprouts (cooked, or boiled & drained) *serving size only =  cup Lettuce, raw (green leaf, endive, romaine) Spinach, raw Turnip greens, raw & chopped   These websites have more information on Coumadin (warfarin):  http://www.king-russell.com/; https://www.hines.net/;    Rib Fracture  A rib fracture is a break or crack in one of the bones of the ribs. The ribs are like a cage that goes around your upper chest. A broken or cracked rib is often painful, but most do not cause other problems. Most rib fractures usually heal on their own in 1-3 months. Follow these instructions at home: Managing pain, stiffness, and swelling  If directed, apply ice to the injured area. ? Put ice in a plastic bag. ? Place a towel between your skin and the bag. ? Leave the ice on for 20 minutes, 2-3 times a day.  Take over-the-counter and prescription medicines only as told by your doctor. Activity  Avoid activities that cause pain to the injured area. Protect your injured area.  Slowly increase activity as told by your doctor. General instructions  Do deep breathing as told by your doctor. You may be told to: ? Take deep breaths many times a day. ? Cough many times a day while hugging a pillow. ? Use a device (incentive spirometer) to do deep breathing many times a day.  Drink enough  fluid to keep your pee (urine) clear or pale yellow.  Do not wear a rib belt or binder. These do not allow you to breathe deeply.  Keep all follow-up visits as told by your doctor. This is important. Contact a doctor if:  You have a fever. Get help right away if:  You have trouble breathing.  You are short of breath.  You cannot stop coughing.  You cough up thick or bloody spit (sputum).  You feel sick to your stomach (nauseous), throw up (vomit), or have belly (abdominal) pain.  Your pain gets worse and medicine does not help. Summary  A rib fracture is a break or crack in one of the bones of the ribs.  Apply ice to the injured area and take medicines for pain as told by your doctor.  Take deep breaths and cough many times a day. Hug a pillow every time you cough. This information is not intended to replace advice given to you by your health care provider. Make sure you discuss any questions you have with your health care provider. Document Revised: 08/03/2017 Document Reviewed: 11/21/2016 Elsevier Patient Education  2020 Elsevier Inc.    Subdural Hematoma  A subdural hematoma is a collection of blood between  the brain and its outer covering (dura). As the amount of blood increases, pressure builds on the brain. There are two types of subdural hematomas:  Acute. This type develops shortly after a hard, direct hit to the head and causes blood to collect very quickly. This is a medical emergency. If it is not diagnosed and treated quickly, it can lead to severe brain injury or death.  Chronic. This is when bleeding develops more slowly, over weeks or months. In some cases, this type does not cause symptoms. What are the causes? This condition is caused by bleeding (hemorrhage) from a broken (ruptured) blood vessel. In most cases, a blood vessel ruptures and bleeds because of a head injury, such as from a hard, direct hit. Head injuries can happen in car accidents, falls,  assaults, or while playing sports. In rare cases, a hemorrhage can happen without a known cause (spontaneously), especially if you take blood thinners (anticoagulants). What increases the risk? This condition is more likely to develop in:  Older people.  Infants.  People who take blood thinners.  People who have head injuries.  People who abuse alcohol. What are the signs or symptoms? Symptoms of this condition can vary depending on the size of the hematoma. Symptoms can be mild, severe, or life-threatening. They include:  Headaches.  Nausea or vomiting.  Changes in vision, such as double vision or loss of vision.  Changes in speech or trouble understanding what people say.  Loss of balance or trouble walking.  Weakness, numbness, or tingling in the arms or legs, especially on one side of the body.  Seizures.  Change in personality.  Increased sleepiness.  Memory loss.  Loss of consciousness.  Coma. Symptoms of acute subdural hematoma can develop over minutes or hours. Symptoms of chronic subdural hematoma may develop over weeks or months. How is this diagnosed? This condition is diagnosed based on the results of:  A physical exam.  Tests of strength, reflexes, coordination, senses, manner of walking (gait), and facial and eye movements (neurological exam).  Imaging tests, such as an MRI or a CT scan. How is this treated? Treatment for this condition depends on the type of hematoma and how severe it is. Treatment for acute hematoma may include:  Emergency surgery to drain blood or remove a blood clot.  Medicines that help the body get rid of excess fluids (diuretics). These may help to reduce pressure in the brain.  Assisted breathing (ventilation). Treatment for chronic hematoma may include:  Observation and bed rest at the hospital.  Surgery. If you take blood thinners, you may need to stop taking them for a short time. You may also be given  anti-seizure (anticonvulsant) medicine. Sometimes, no treatment is needed for chronic subdural hematoma. Follow these instructions at home: Activity  Avoid situations where you could injure your head again, such as in competitive sports, downhill snow sports, and horseback riding. Do not do these activities until your health care provider approves. ? Wear protective gear, such as a helmet, when participating in activities such as biking or contact sports.  Avoid too much visual stimulation while recovering. This means limiting how much you read and limiting your screen time on a smart phone, tablet, computer, or TV.  Rest as told by your health care provider. Rest helps the brain heal.  Try to avoid activities that cause physical or mental stress. Return to work or school as told by your health care provider.  Do not lift anything that is  heavier than 5 lb (2.3 kg), or the limit you are told, until your health care provider says that it is safe.  Do not drive, ride a bike, or use heavy machinery until your health care provider approves.  Always wear your seat belt when you are in a motor vehicle. Alcohol use  Do not drink alcohol if your health care provider tells you not to drink.  If you drink alcohol, limit how much you use to: ? 0-1 drink a day for women. ? 0-2 drinks a day for men. General instructions  Monitor your symptoms, and ask people around you to do the same. Recovery from brain injuries varies. Talk with your health care provider about what to expect.  Take over-the-counter and prescription medicines only as told by your health care provider. Do not take blood thinners or NSAIDs unless your health care provider approves. These include aspirin, ibuprofen, naproxen, and warfarin.  Keep your home environment safe to reduce the risk of falling.  Keep all follow-up visits as told by your health care provider. This is important. Where to find more information  [employer] of Neurological Disorders and Stroke: ToledoAutomobile.co.uk  American Academy of Neurology (AAN): ComparePet.cz  Brain Injury Association of America: www.biausa.org Get help right away if you:  Are taking blood thinners and you fall or you experience minor trauma to the head. If you take any blood thinners, even a very small injury can cause a subdural hematoma.  Have a bleeding disorder and you fall or you experience minor trauma to the head.  Develop any of the following symptoms after a head injury: ? Clear fluid draining from your nose or ears. ? Nausea or vomiting. ? Changes in speech or trouble understanding what people say. ? Seizures. ? Drowsiness or a decrease in alertness. ? Double vision. ? Numbness or inability to move (paralysis) in any part of your body. ? Difficulty walking or poor coordination. ? Difficulty thinking. ? Confusion or forgetfulness. ? Personality changes. ? Irrational or aggressive behavior. These symptoms may represent a serious problem that is an emergency. Do not wait to see if the symptoms will go away. Get medical help right away. Call your local emergency services (911 in the [place] Do not drive yourself to the hospital. Summary  A subdural hematoma is a collection of blood between the brain and its outer covering (dura).  Treatment for this condition depends on what type of subdural hematoma you have and how severe it is.  Symptoms can vary from mild to severe to life-threatening.  Monitor your symptoms, and ask others around you to do the same. This information is not intended to replace advice given to you by your health care provider. Make sure you discuss any questions you have with your health care provider. Document Revised: 07/22/2018 Document Reviewed: 07/22/2018 Elsevier Patient Education  2020 [employer].

## 2020-02-28 LAB — GLUCOSE, CAPILLARY
Glucose-Capillary: 91 mg/dL (ref 70–99)
Glucose-Capillary: 114 mg/dL — ABNORMAL HIGH (ref 70–99)
Glucose-Capillary: 98 mg/dL (ref 70–99)
Glucose-Capillary: 112 mg/dL — ABNORMAL HIGH (ref 70–99)

## 2020-02-28 LAB — PROTIME-INR
INR: 2.9 — ABNORMAL HIGH (ref 0.8–1.2)
Prothrombin Time: 29.2 seconds — ABNORMAL HIGH (ref 11.4–15.2)

## 2020-02-28 MED ORDER — LIDOCAINE 5 % EX PTCH
1.0000 | MEDICATED_PATCH | CUTANEOUS | Status: DC
  Administered 2020-02-28 – 2020-03-05 (×4): 1 via TRANSDERMAL
  Filled 2020-02-28 (×5): qty 1

## 2020-02-28 MED ORDER — WARFARIN SODIUM 2.5 MG PO TABS
2.5000 mg | ORAL_TABLET | Freq: Once | ORAL | Status: AC
  Administered 2020-02-28: 2.5 mg via ORAL
  Filled 2020-02-28: qty 1

## 2020-02-28 NOTE — Progress Notes (Signed)
ANTICOAGULATION CONSULT NOTE - Follow Up Consult  Pharmacy Consult for Warfarin Indication: atrial fibrillation and mAVR  Allergies  Allergen Reactions  . Apresoline [Hydralazine] Nausea And Vomiting  . Ace Inhibitors Other (See Comments)    cough  . Benadryl [Diphenhydramine Hcl] Other (See Comments)    Makes restless legs worse  . Imdur [Isosorbide Dinitrate] Other (See Comments)    headache  . Metoprolol Other (See Comments)    Kidney failure  . Nsaids Other (See Comments)    REACTION: Currently taking Coumadin  . Valsartan Other (See Comments)    REACTION:shuts down Kidney    Patient Measurements: Height: 5\' 6"  (167.6 cm) Weight: 91.4 kg (201 lb 8 oz) IBW/kg (Calculated) : 63.8   Vital Signs: Temp: 97.7 F (36.5 C) (06/26 0329) Temp Source: Oral (06/26 0329) BP: 138/71 (06/26 0329) Pulse Rate: 64 (06/26 0329)  Labs: Recent Labs    02/26/20 0414 02/27/20 0330 02/28/20 0345  HGB 12.0*  --   --   HCT 37.1*  --   --   PLT 157  --   --   LABPROT 20.1* 26.3* 29.2*  INR 1.8* 2.5* 2.9*  CREATININE 1.66* 1.46*  --     Estimated Creatinine Clearance: 51.2 mL/min (A) (by C-G formula based on SCr of 1.46 mg/dL (H)).  Assessment:  68 yom [facility] found to have 5 mm SDH with ~2 mm midline shift. 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 Prior pharmacist spoke with patient who [facility] [place] [facility] [place] [facility]. Last visit at [facility] was 4/30, unable to obtain 5/30 records as the clinic is closed that day. He does not know his PTA warfarin dose but reports he has not had any changes recently. At last [facility] visit warfarin dose was 2.5 mg Tuesday and Thursday, 5 mg all other days. [facility] notes states patient has an INR goal of 2.5-3.5 which is likely because of his mAVR and risk factor of atrial fibrillation. Prior pharmacist d/t with Trauma PA for INR goal. Patient with atrial  fibrillation and mechanical aortic valve so 2.5-3.5 would be ideal but given his new SDH and EKGs from 6/18 showing no active afib, will aim for INR goal of 2-3.     Patient's INR today 2.9. At the higher end of range. Will monitor. No signs or symptoms of bleeding. Patient is also on Amiodarone.   Goal of Therapy:  INR 2-3 Monitor platelets by anticoagulation protocol: Yes   Plan:   Warfarin to 2.5 mg x 1 today.  Daily PT/INR.  INR goal now 2-3      Thank you for the interesting consult and for involving pharmacy in this patient's care.  7/18, PharmD, BCPS 02/28/2020 7:48 AM PGY-2 Pharmacy Administration Resident Please check [email or web address] for unit-specific pharmacist phone numbers

## 2020-02-28 NOTE — Progress Notes (Addendum)
[facility] Progress Note     Subjective: No acute change.   Objective: Vital signs in last 24 hours: Temp:  [97.7 F (36.5 C)] 97.7 F (36.5 C) (06/26 0931) Pulse Rate:  [60-65] 60 (06/26 0931) Resp:  [17] 17 (06/26 0931) BP: (137-145)/(69-72) 137/69 (06/26 0931) SpO2:  [92 %-94 %] 92 % (06/26 0931) Last BM Date: 02/24/20  Intake/Output from previous day: No intake/output data recorded. Intake/Output this shift: No intake/output data recorded.  PE: Gen: comfortable, no distress Neuro: non-focal exam HEENT: PERRL. EOMI. Bandage to left forehead c/d/i Neck: supple CV: RRR Pulm: Normal rate and effort. On RA. Lungs CTA b/l. Left ribcage pain.  Abd: soft, ND, NT, +BS Extr: wwp, no edema   Lab Results:  Recent Labs    02/26/20 0414  WBC 8.0  HGB 12.0*  HCT 37.1*  PLT 157   BMET Recent Labs    02/26/20 0414 02/27/20 0330  NA 139 137  K 4.3 4.1  CL 104 101  CO2 25 27  GLUCOSE 91 80  BUN 31* 27*  CREATININE 1.66* 1.46*  CALCIUM 8.9 9.0   PT/INR Recent Labs    02/27/20 0330 02/28/20 0345  LABPROT 26.3* 29.2*  INR 2.5* 2.9*   CMP     Component Value Date/Time   NA 137 02/27/2020 0330   NA 141 06/04/2019 1537   K 4.1 02/27/2020 0330   CL 101 02/27/2020 0330   CO2 27 02/27/2020 0330   GLUCOSE 80 02/27/2020 0330   BUN 27 (H) 02/27/2020 0330   BUN 22 06/04/2019 1537   CREATININE 1.46 (H) 02/27/2020 0330   CREATININE 1.11 01/22/2013 1205   CALCIUM 9.0 02/27/2020 0330   PROT 7.8 02/20/2020 1640   PROT 7.1 06/04/2019 1537   ALBUMIN 4.0 02/20/2020 1640   ALBUMIN 4.3 06/04/2019 1537   AST 50 (H) 02/20/2020 1640   ALT 24 02/20/2020 1640   ALKPHOS 66 02/20/2020 1640   BILITOT 0.8 02/20/2020 1640   BILITOT 0.5 06/04/2019 1537   GFRNONAA 49 (L) 02/27/2020 0330   GFRNONAA 71 01/22/2013 1205   GFRAA 56 (L) 02/27/2020 0330   GFRAA 82 01/22/2013 1205   Lipase     Component Value Date/Time   LIPASE 64 (H) 03/28/2010 1733        Studies/Results: No results found.  Anti-infectives: Anti-infectives (From admission, onward)   None       Assessment/Plan Fall Left ribs 5-8 fracture- pain control, IS, mobilization, therapies Small left subdural hematoma-NSGY c/s, no intervention, keppra x7d. Ok to restart anticoagulation. Coumadin dosing per pharmacy. DM- Home meds, SSI Chronic pain- Scheduled Tylenol. Home oxy 15mg  and tramadol. D/c Toradol 6/23 with uptrending Cr. Will add prn Ultram and Robaxin if more pain control is needed. D/c Dilaudid.  HTN- Home meds Depression- resume zoloft A fib- resumed coumadin, pharmacy to dose. INR 2.9 CAD- resumed amio and other cardiac home meds Elevated Cr - Cr 1.46 from 1.66 (improved), BUN remains stable 27. UOP 1800 cc/24h; hold lasix today. toradol D/C-ed 6/23. Encourage PO hydration ABL Anemia -  stable   FEN -carb mod/HH diet, restarted home lasix VTE- Coumadin, trend INR, SCDs ID- none  Dispo -PT/OT. Patient is medically stable for discharge to [facility] (pending insurance auth./bed availability), Encourage PO hydration and hold lasix today.  POC - [place], [place]  LOS: 8 days    [place] , [facility] 02/28/2020, 12:53 PM Please see Amion for pager number during day hours 7:00am-4:30pm

## 2020-02-29 LAB — BASIC METABOLIC PANEL
Anion gap: 9 (ref 5–15)
BUN: 18 mg/dL (ref 8–23)
CO2: 24 mmol/L (ref 22–32)
Calcium: 8.9 mg/dL (ref 8.9–10.3)
Chloride: 102 mmol/L (ref 98–111)
Creatinine, Ser: 1.28 mg/dL — ABNORMAL HIGH (ref 0.61–1.24)
GFR calc Af Amer: >60 mL/min (ref >60)
GFR calc non Af Amer: 57 mL/min — ABNORMAL LOW (ref >60)
Glucose, Bld: 105 mg/dL — ABNORMAL HIGH (ref 70–99)
Potassium: 4.3 mmol/L (ref 3.5–5.1)
Sodium: 135 mmol/L (ref 135–145)

## 2020-02-29 LAB — PROTIME-INR
INR: 3.4 — ABNORMAL HIGH (ref 0.8–1.2)
Prothrombin Time: 33.3 seconds — ABNORMAL HIGH (ref 11.4–15.2)

## 2020-02-29 LAB — GLUCOSE, CAPILLARY
Glucose-Capillary: 83 mg/dL (ref 70–99)
Glucose-Capillary: 130 mg/dL — ABNORMAL HIGH (ref 70–99)
Glucose-Capillary: 104 mg/dL — ABNORMAL HIGH (ref 70–99)
Glucose-Capillary: 93 mg/dL (ref 70–99)

## 2020-02-29 NOTE — Progress Notes (Signed)
ANTICOAGULATION CONSULT NOTE - Follow Up Consult  Pharmacy Consult for Warfarin Indication: atrial fibrillation and mAVR  Allergies  Allergen Reactions  . Apresoline [Hydralazine] Nausea And Vomiting  . Ace Inhibitors Other (See Comments)    cough  . Benadryl [Diphenhydramine Hcl] Other (See Comments)    Makes restless legs worse  . Imdur [Isosorbide Dinitrate] Other (See Comments)    headache  . Metoprolol Other (See Comments)    Kidney failure  . Nsaids Other (See Comments)    REACTION: Currently taking Coumadin  . Valsartan Other (See Comments)    REACTION:shuts down Kidney    Patient Measurements: Height: 5\' 6"  (167.6 cm) Weight: 91.4 kg (201 lb 8 oz) IBW/kg (Calculated) : 63.8   Vital Signs: Temp: 97.7 F (36.5 C) (06/27 0408) Temp Source: Oral (06/27 0408) BP: 145/84 (06/27 0408) Pulse Rate: 64 (06/27 0408)  Labs: Recent Labs    02/27/20 0330 02/28/20 0345 02/29/20 0427  LABPROT 26.3* 29.2* 33.3*  INR 2.5* 2.9* 3.4*  CREATININE 1.46*  --   --     Estimated Creatinine Clearance: 51.2 mL/min (A) (by C-G formula based on SCr of 1.46 mg/dL (H)).  Assessment:  68 yom [facility] found to have 5 mm SDH with ~2 mm midline shift. 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 Prior pharmacist spoke with patient who states [facility] [place] [facility] [facility] INR checks. Last visit at family [facility] was 4/30, unable to obtain 5/30 records as the clinic is closed that day. He does not know his PTA warfarin dose but reports he has not had any changes recently. At last [facility] visit warfarin dose was 2.5 mg Tuesday and Thursday, 5 mg all other days. [facility] notes states patient has an INR goal of 2.5-3.5 which is likely because of his mAVR and risk factor of atrial fibrillation. Prior pharmacist d/t with Trauma PA for INR goal. Patient with atrial fibrillation and mechanical aortic valve so 2.5-3.5 would be  ideal but given his new SDH and EKGs from 6/18 showing no active afib, will aim for INR goal of 2-3.   Patient's INR today 3.4. Above goal (2 - 3), will skip today's dose and re-dose tomorrow. No signs or symptoms of patient bleeding.  Goal of Therapy:  INR 2-3 Monitor platelets by anticoagulation protocol: Yes   Plan:   No Wafarin today.   Daily PT/INR.  INR goal now 2-3      Thank you for the interesting consult and for involving pharmacy in this patient's care.  7/18, PharmD, BCPS 02/29/2020 7:36 AM PGY-2 Pharmacy Administration Resident Please check [email or web address] for unit-specific pharmacist phone numbers

## 2020-02-29 NOTE — Plan of Care (Signed)

## 2020-02-29 NOTE — Progress Notes (Signed)
[facility] Progress Note     Subjective: No acute change. Continues to have significant pain in left arm which is pre trauma.    Objective: Vital signs in last 24 hours: Temp:  [97.4 F (36.3 C)-97.7 F (36.5 C)] 97.6 F (36.4 C) (06/27 0904) Pulse Rate:  [60-64] 61 (06/27 0904) Resp:  [17-19] 19 (06/27 0904) BP: (129-147)/(71-84) 147/76 (06/27 0904) SpO2:  [92 %-96 %] 94 % (06/27 0904) Last BM Date: 02/28/20  Intake/Output from previous day: 06/26 0701 - 06/27 0700 In: -  Out: 350 [Urine:350] Intake/Output this shift: No intake/output data recorded.  PE: Gen: comfortable, no distress Neuro: non-focal exam HEENT: PERRL. EOMI. Bandage to left forehead c/d/i Neck: supple CV: regular Pulm: Normal rate and effort. On RA.  Left ribcage tenderness Abd: soft, ND, NT Extr: wwp, no edema   Lab Results:  No results for input(s): WBC, HGB, HCT, PLT in the last 72 hours. BMET Recent Labs    02/27/20 0330  NA 137  K 4.1  CL 101  CO2 27  GLUCOSE 80  BUN 27*  CREATININE 1.46*  CALCIUM 9.0   PT/INR Recent Labs    02/28/20 0345 02/29/20 0427  LABPROT 29.2* 33.3*  INR 2.9* 3.4*   CMP     Component Value Date/Time   NA 137 02/27/2020 0330   NA 141 06/04/2019 1537   K 4.1 02/27/2020 0330   CL 101 02/27/2020 0330   CO2 27 02/27/2020 0330   GLUCOSE 80 02/27/2020 0330   BUN 27 (H) 02/27/2020 0330   BUN 22 06/04/2019 1537   CREATININE 1.46 (H) 02/27/2020 0330   CREATININE 1.11 01/22/2013 1205   CALCIUM 9.0 02/27/2020 0330   PROT 7.8 02/20/2020 1640   PROT 7.1 06/04/2019 1537   ALBUMIN 4.0 02/20/2020 1640   ALBUMIN 4.3 06/04/2019 1537   AST 50 (H) 02/20/2020 1640   ALT 24 02/20/2020 1640   ALKPHOS 66 02/20/2020 1640   BILITOT 0.8 02/20/2020 1640   BILITOT 0.5 06/04/2019 1537   GFRNONAA 49 (L) 02/27/2020 0330   GFRNONAA 71 01/22/2013 1205   GFRAA 56 (L) 02/27/2020 0330   GFRAA 82 01/22/2013 1205   Lipase     Component Value Date/Time    LIPASE 64 (H) 03/28/2010 1733       Studies/Results: No results found.  Anti-infectives: Anti-infectives (From admission, onward)   None       Assessment/Plan Fall Left ribs 5-8 fracture- pain control, IS, mobilization, therapies Small left subdural hematoma-NSGY c/s, no intervention, keppra x7d. Back on anticoagulation. Coumadin dosing per pharmacy. DM- Home meds, SSI Chronic pain- Scheduled Tylenol. Home oxy 15mg  and tramadol. D/c Toradol 6/23 with uptrending Cr. Will add prn Ultram and Robaxin if more pain control is needed. D/c Dilaudid.  HTN- Home meds Depression- resume zoloft A fib- resumed coumadin, pharmacy to dose. INR 3.4 CAD- resumed amio and other cardiac home meds Elevated Cr - Cr 1.46 from 1.66 (improved), BUN remains stable 27. UOP 1800 cc/24h; lasix held yesterday. toradol D/C-ed 6/23. Encourage PO hydration. Recheck BMEt.   ABL Anemia -  stable   FEN -carb mod/HH diet, lasix prn. VTE- Coumadin, trend INR, SCDs ID- none  Dispo -PT/OT. Patient is medically stable for discharge to CIR (pending insurance auth./bed availability), POC - Son, [place]  LOS: 9 days     Milus Height, MD FACS Surgical Oncology, General Surgery, Trauma and [place] [facility], [place] for weekday/non holidays Check [email or web address] for coverage  night/weekend/holidays  Do not use SecureChat as it is not reliable for timely patient care.

## 2020-03-01 LAB — GLUCOSE, CAPILLARY
Glucose-Capillary: 83 mg/dL (ref 70–99)
Glucose-Capillary: 101 mg/dL — ABNORMAL HIGH (ref 70–99)
Glucose-Capillary: 97 mg/dL (ref 70–99)

## 2020-03-01 LAB — PROTIME-INR
INR: 3.5 — ABNORMAL HIGH (ref 0.8–1.2)
Prothrombin Time: 33.7 seconds — ABNORMAL HIGH (ref 11.4–15.2)

## 2020-03-01 MED ORDER — METHOCARBAMOL 500 MG PO TABS
500.0000 mg | ORAL_TABLET | Freq: Four times a day (QID) | ORAL | Status: DC
  Administered 2020-03-01 – 2020-03-02 (×5): 500 mg via ORAL
  Filled 2020-03-01 (×5): qty 1

## 2020-03-01 NOTE — Progress Notes (Signed)
Physical Therapy Treatment Patient Details Name: Andre Malone MRN: [id number] DOB: June 18, 1951 Today's Date: 03/01/2020    History of Present Illness 69 yo male was carrying wood and tools down stairs when he missed his footing and fell.  Workup revealed  very small SDH and rib fx 5-8. PMH significant for blind in Right eye, CAD, s/p AVR and pacemaker, DM, HTN    PT Comments    Pt supine in bed on arrival. He continues to require external assistance to move to edge of bed.  Focused on increasing activity and trialing stairs.  Pt tolerated activity well with rest periods.  He continues to benefit from skilled rehab at [facility] to maximize functional gains before returning home.  He has the ability to reach independence with short term rehab stay.     Follow Up Recommendations  CIR     Equipment Recommendations  Other (comment) (TBD)    Recommendations for Other Services       Precautions / Restrictions Precautions Precautions: Fall Precaution Comments: Rib fractures Restrictions Weight Bearing Restrictions: No    Mobility  Bed Mobility Overal bed mobility: Needs Assistance Bed Mobility: Rolling;Sidelying to Sit;Sit to Sidelying Rolling: Supervision Sidelying to sit: Mod assist     Sit to sidelying: Min assist General bed mobility comments: Increased time and effort with bed mobility 2/2 8/10 pain in L ribs. Increased assistance to elevate trunk into a seated position.  Transfers Overall transfer level: Needs assistance Equipment used: Rolling walker (2 wheeled) Transfers: Sit to/from Stand Sit to Stand: Min guard         General transfer comment: STS from elevated EOB x2 trials to RW with cues for hand placement.   Ambulation/Gait Ambulation/Gait assistance: Min guard Gait Distance (Feet): 120 Feet (x2) Assistive device: Rolling walker (2 wheeled) Gait Pattern/deviations: Step-through pattern;Decreased stride length;Shuffle Gait velocity: decr   General Gait  Details: x 1 rest break post stair training.  Cues for upper trunk control and RW position   Stairs Stairs: Yes Stairs assistance: Min guard Stair Management: Two rails Number of Stairs: 4 General stair comments: Cues for sequencing and hand placement on rails.   Wheelchair Mobility    Modified Rankin (Stroke Patients Only)       Balance Overall balance assessment: Needs assistance Sitting-balance support: Feet supported Sitting balance-Leahy Scale: Fair       Standing balance-Leahy Scale: Fair                              Cognition Arousal/Alertness: Awake/alert Behavior During Therapy: WFL for tasks assessed/performed Overall Cognitive Status: Within Functional Limits for tasks assessed (for functional mobility.)                                        Exercises      General Comments        Pertinent Vitals/Pain Pain Assessment: 0-10 Pain Score: 6  Pain Location: back, ribs, and neck Pain Descriptors / Indicators: Grimacing;Guarding;Sore Pain Intervention(s): Monitored during session;Repositioned    Home Living                      Prior Function            PT Goals (current goals can now be found in the care plan section) Acute Rehab PT Goals Patient Stated Goal:  To return home.  Potential to Achieve Goals: Good Progress towards PT goals: Progressing toward goals    Frequency    Min 3X/week      PT Plan Current plan remains appropriate    Co-evaluation              AM-PAC PT "6 Clicks" Mobility   Outcome Measure  Help needed turning from your back to your side while in a flat bed without using bedrails?: A Little Help needed moving from lying on your back to sitting on the side of a flat bed without using bedrails?: A Lot Help needed moving to and from a bed to a chair (including a wheelchair)?: A Little Help needed standing up from a chair using your arms (e.g., wheelchair or bedside chair)?:  A Little Help needed to walk in hospital room?: A Little Help needed climbing 3-5 steps with a railing? : A Little 6 Click Score: 17    End of Session Equipment Utilized During Treatment: Gait belt Activity Tolerance: Patient tolerated treatment well;Patient limited by pain Patient left: with call bell/phone within reach (seated on commode attempting to have BM nurse and NT informed.) Nurse Communication: Mobility status (need for assistance after toileting.) PT Visit Diagnosis: Other abnormalities of gait and mobility (R26.89);Pain;History of falling (Z91.81) Pain - Right/Left: Left Pain - part of body:  (ribs)     Time: 7209-4709 PT Time Calculation (min) (ACUTE ONLY): 22 min  Charges:  $Gait Training: 8-22 mins                     Bonney Leitz , PTA Acute Rehabilitation Services Pager [phone] Office [phone]     Maryalice Pasley Artis Delay 03/01/2020, 5:13 PM

## 2020-03-01 NOTE — Plan of Care (Signed)

## 2020-03-01 NOTE — Plan of Care (Signed)

## 2020-03-01 NOTE — Progress Notes (Signed)
[facility] Progress Note     Subjective: CC-  Continues to have pain from left rib fractures and chronic neck pain. Denies CP or SOB. Tolerating diet. BM yesterday.  Objective: Vital signs in last 24 hours: Temp:  [97.5 F (36.4 C)-97.9 F (36.6 C)] 97.5 F (36.4 C) (06/28 0743) Pulse Rate:  [60-63] 60 (06/28 0743) Resp:  [16-18] 16 (06/28 0743) BP: (125-154)/(71-79) 125/71 (06/28 0743) SpO2:  [95 %-98 %] 95 % (06/28 0743) Last BM Date: 02/29/20  Intake/Output from previous day: No intake/output data recorded. Intake/Output this shift: No intake/output data recorded.  PE: Gen:  Alert, NAD, pleasant HEENT: EOM's intact, pupils equal and round, left forehead lac and abrasion s/p repair with sutures in place Card:  RRR Pulm:  CTAB, no W/R/R, rate and effort normal Abd: Soft, NT/ND, +BS Ext:  calves soft and nontender Psych: A&Ox4  Skin: no rashes noted, warm and dry  Lab Results:  No results for input(s): WBC, HGB, HCT, PLT in the last 72 hours. BMET Recent Labs    02/29/20 1239  NA 135  K 4.3  CL 102  CO2 24  GLUCOSE 105*  BUN 18  CREATININE 1.28*  CALCIUM 8.9   PT/INR Recent Labs    02/29/20 0427 03/01/20 0713  LABPROT 33.3* 33.7*  INR 3.4* 3.5*   CMP     Component Value Date/Time   NA 135 02/29/2020 1239   NA 141 06/04/2019 1537   K 4.3 02/29/2020 1239   CL 102 02/29/2020 1239   CO2 24 02/29/2020 1239   GLUCOSE 105 (H) 02/29/2020 1239   BUN 18 02/29/2020 1239   BUN 22 06/04/2019 1537   CREATININE 1.28 (H) 02/29/2020 1239   CREATININE 1.11 01/22/2013 1205   CALCIUM 8.9 02/29/2020 1239   PROT 7.8 02/20/2020 1640   PROT 7.1 06/04/2019 1537   ALBUMIN 4.0 02/20/2020 1640   ALBUMIN 4.3 06/04/2019 1537   AST 50 (H) 02/20/2020 1640   ALT 24 02/20/2020 1640   ALKPHOS 66 02/20/2020 1640   BILITOT 0.8 02/20/2020 1640   BILITOT 0.5 06/04/2019 1537   GFRNONAA 57 (L) 02/29/2020 1239   GFRNONAA 71 01/22/2013 1205   GFRAA >60 02/29/2020  1239   GFRAA 82 01/22/2013 1205   Lipase     Component Value Date/Time   LIPASE 64 (H) 03/28/2010 1733       Studies/Results: No results found.  Anti-infectives: Anti-infectives (From admission, onward)   None       Assessment/Plan Fall Left ribs 5-8 fracture- pain control, IS, mobilization, therapies Small left subdural hematoma-NSGY c/s, no intervention, keppra x7d completed. Back on anticoagulation. Coumadin dosing per pharmacy. DM- Home meds, SSI Chronic pain- Home oxy 15mg  and tramadol. D/c Toradol 6/23 with uptrending Cr. On scheduled tylenol and ultram, add scheduled robaxin 6/28 HTN- Home meds Depression- zoloft A fib- resumed coumadin, pharmacy to dose. INR3.5 CAD- resumed amio and other cardiac home meds Elevated Cr - Cr 1.28 (6/27) - improved, BUN 18. Encourage PO hydration. ABL Anemia - stable  Facial laceration - s/p repair, d/c sutures 6/28  FEN -carb mod/HH diet, lasix prn. VTE- Coumadin, trend INR, SCDs ID- none  Dispo -PT/OT.Patient is medically stable for discharge toCIR(pending insurance auth./bed availability). POC - Son, [place]   LOS: 10 days    [place], [facility] 03/01/2020, 10:37 AM Please see Amion for pager number during day hours 7:00am-4:30pm

## 2020-03-01 NOTE — Progress Notes (Signed)
Inpatient Rehab Admissions Coordinator:   Sent updated clinical information to insurance company, per their request.  Still awaiting determination re: prior authorization for CIR.   Estill Dooms, PT, DPT Admissions Coordinator [phone] 03/01/20  12:34 PM

## 2020-03-01 NOTE — Progress Notes (Signed)
Occupational Therapy Treatment Patient Details Name: Andre Malone MRN: [id number] DOB: 03-Jun-1951 Today's Date: 03/01/2020    History of present illness 69 yo male was carrying wood and tools down stairs when he missed his footing and fell.  Workup revealed  very small SDH and rib fx 5-8. PMH significant for blind in Right eye, CAD, s/p AVR and pacemaker, DM, HTN   OT comments  Patient met lying supine in bed requiring increased coaxing/encoruagement for participation with OT therapy services this date 2/2 fatigue. Patient states that he didn't get much sleep last night. Patient demonstrates progress toward goals this date requiring Min guard for functional mobility with use of RW, Min guard for grooming tasks standing at sink level, and Mod A for LB dressing to don pants seated at EOB. Patient would benefit from use of AE to increase independence with LB bathing/dressing tasks. Patient continues to benefit from skilled OT services in prep for return to PLOF. Continued recommendation for CIR to increase safety and independence with self-care tasks, functional mobility, and transfers prior to d/c home.    Follow Up Recommendations  CIR    Equipment Recommendations  Other (comment) (Defer to next level of care.)    Recommendations for Other Services      Precautions / Restrictions Precautions Precautions: Fall Precaution Comments: Rib fractures Restrictions Weight Bearing Restrictions: No       Mobility Bed Mobility Overal bed mobility: Needs Assistance Bed Mobility: Rolling;Sidelying to Sit;Sit to Sidelying Rolling: Min assist Sidelying to sit: Min assist       General bed mobility comments: Increased time and effort with bed mobility 2/2 8/10 pain in L ribs.   Transfers Overall transfer level: Needs assistance Equipment used: Rolling walker (2 wheeled) Transfers: Sit to/from Stand Sit to Stand: Min guard         General transfer comment: STS from elevated EOB x2  trials to RW with cues for hand placement.     Balance Overall balance assessment: Needs assistance Sitting-balance support: Feet supported Sitting balance-Leahy Scale: Fair     Standing balance support: Bilateral upper extremity supported;During functional activity Standing balance-Leahy Scale: Fair                             ADL either performed or assessed with clinical judgement   ADL       Grooming: Wash/dry hands;Wash/dry face;Oral care;Standing;Min guard               Lower Body Dressing: Moderate assistance (To don pants seated EOB without use of AE. )               Functional mobility during ADLs: Min guard;Rolling walker General ADL Comments: Pt. with increased SOB with little activity requiring seated RB during grooming at sink level      Vision       Perception     Praxis      Cognition Arousal/Alertness: Awake/alert Behavior During Therapy: WFL for tasks assessed/performed Overall Cognitive Status: Within Functional Limits for tasks assessed Area of Impairment: Problem solving                   Current Attention Level: Selective Memory: Decreased short-term memory Following Commands: Follows one step commands consistently Safety/Judgement: Decreased awareness of deficits Awareness: Emergent Problem Solving: Slow processing;Requires verbal cues          Exercises     Shoulder Instructions  General Comments      Pertinent Vitals/ Pain       Pain Assessment: 0-10 Pain Score: 8  Pain Location: back, ribs, and neck Pain Descriptors / Indicators: Grimacing;Guarding;Sore  Home Living                                          Prior Functioning/Environment              Frequency  Min 2X/week        Progress Toward Goals  OT Goals(current goals can now be found in the care plan section)     Acute Rehab OT Goals Patient Stated Goal: To return home.  OT Goal Formulation: With  patient Time For Goal Achievement: 03/07/20 Potential to Achieve Goals: Good ADL Goals Pt Will Perform Grooming: with modified independence;standing Pt Will Perform Upper Body Dressing: with set-up;with supervision;sitting Pt Will Perform Lower Body Dressing: with min guard assist;sit to/from stand Pt Will Transfer to Toilet: ambulating;with set-up;with supervision;regular height toilet Additional ADL Goal #1: Pt will demonstrate selective attention during simple ADLs with Min cues Additional ADL Goal #2: Pt will perform four part ADL/IADL with Min cues  Plan Discharge plan remains appropriate    Co-evaluation                 AM-PAC OT "6 Clicks" Daily Activity     Outcome Measure   Help from another person eating meals?: None Help from another person taking care of personal grooming?: A Little Help from another person toileting, which includes using toliet, bedpan, or urinal?: A Little Help from another person bathing (including washing, rinsing, drying)?: A Lot Help from another person to put on and taking off regular upper body clothing?: A Little Help from another person to put on and taking off regular lower body clothing?: A Lot 6 Click Score: 17    End of Session Equipment Utilized During Treatment: Gait belt;Rolling walker  OT Visit Diagnosis: Unsteadiness on feet (R26.81);Other abnormalities of gait and mobility (R26.89);Muscle weakness (generalized) (M62.81);Pain;Other symptoms and signs involving cognitive function Pain - Right/Left: Left Pain - part of body:  (Ribs/back)   Activity Tolerance Patient limited by lethargy;Patient limited by fatigue   Patient Left in chair;with call bell/phone within reach;with chair alarm set   Nurse Communication          Time: 1610-9604 OT Time Calculation (min): 42 min  Charges: OT General Charges $OT Visit: 1 Visit OT Treatments $Self Care/Home Management : 38-52 mins  Edythe Riches H. OTR/L Supplemental OT,  Department of rehab services [phone]   Quasim Doyon R H. 03/01/2020, 12:08 PM

## 2020-03-01 NOTE — Progress Notes (Signed)
ANTICOAGULATION CONSULT NOTE - Follow Up Consult  Pharmacy Consult for Warfarin Indication: atrial fibrillation and mAVR  Allergies  Allergen Reactions  . Apresoline [Hydralazine] Nausea And Vomiting  . Ace Inhibitors Other (See Comments)    cough  . Benadryl [Diphenhydramine Hcl] Other (See Comments)    Makes restless legs worse  . Imdur [Isosorbide Dinitrate] Other (See Comments)    headache  . Metoprolol Other (See Comments)    Kidney failure  . Nsaids Other (See Comments)    REACTION: Currently taking Coumadin  . Valsartan Other (See Comments)    REACTION:shuts down Kidney    Patient Measurements: Height: 5\' 6"  (167.6 cm) Weight: 91.4 kg (201 lb 8 oz) IBW/kg (Calculated) : 63.8   Vital Signs: Temp: 97.5 F (36.4 C) (06/28 0743) Temp Source: Oral (06/28 0743) BP: 125/71 (06/28 0743) Pulse Rate: 60 (06/28 0743)  Labs: Recent Labs    02/28/20 0345 02/29/20 0427 02/29/20 1239 03/01/20 0713  LABPROT 29.2* 33.3*  --  33.7*  INR 2.9* 3.4*  --  3.5*  CREATININE  --   --  1.28*  --     Estimated Creatinine Clearance: 58.4 mL/min (A) (by C-G formula based on SCr of 1.28 mg/dL (H)).  Assessment:  68 yom presenting to [facility] s/p fall found to have 5 mm SDH with ~2 mm midline shift. 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 Prior pharmacist spoke with patient who states [facility] [place] [facility] for [facility] INR checks. Last visit at [facility] was 4/30, unable to obtain 5/30 records as the clinic is closed that day. He does not know his PTA warfarin dose but reports he has not had any changes recently. At last [facility] visit warfarin dose was 2.5 mg Tuesday and Thursday, 5 mg all other days. [facility] notes states patient has an INR goal of 2.5-3.5 which is likely because of his mAVR and risk factor of atrial fibrillation. Prior pharmacist d/t with Trauma PA for INR goal. Patient with atrial fibrillation and mechanical aortic  valve so 2.5-3.5 would be ideal but given his new SDH and EKGs from 6/18 showing no active afib, will aim for INR goal of 2-3.   Patient's INR today 3.5. Above goal (2 - 3), will skip today's dose and re-evaluate tomorrow. No signs or symptoms of patient bleeding.  Goal of Therapy:  INR 2-3 Monitor platelets by anticoagulation protocol: Yes   Plan:   No Wafarin today.   Daily PT/INR.  INR goal now 2-3      Nasiyah Laverdiere A. 7/18, PharmD, BCPS, FNKF Clinical Pharmacist [place] Please utilize Amion for appropriate phone number to reach the unit pharmacist [facility])

## 2020-03-02 LAB — GLUCOSE, CAPILLARY
Glucose-Capillary: 75 mg/dL (ref 70–99)
Glucose-Capillary: 97 mg/dL (ref 70–99)
Glucose-Capillary: 88 mg/dL (ref 70–99)

## 2020-03-02 LAB — PROTIME-INR
INR: 3.7 — ABNORMAL HIGH (ref 0.8–1.2)
Prothrombin Time: 35.4 seconds — ABNORMAL HIGH (ref 11.4–15.2)

## 2020-03-02 MED ORDER — METHOCARBAMOL 500 MG PO TABS
250.0000 mg | ORAL_TABLET | Freq: Four times a day (QID) | ORAL | Status: DC
  Administered 2020-03-02 – 2020-03-03 (×3): 250 mg via ORAL
  Filled 2020-03-02 (×3): qty 1

## 2020-03-02 NOTE — Plan of Care (Signed)

## 2020-03-02 NOTE — [place] FL2 (Signed)
[place] MEDICAID FL2 LEVEL OF CARE SCREENING TOOL     IDENTIFICATION  Patient Name: Andre Malone Birthdate: 07/16/1951 Sex: male Admission Date (Current Location): 02/20/2020  [facility] and [place] Number:  Producer, television/film/video and Address:  [facility] [place], [address], [place], [place] [address]      Provider Number: [id number]  Attending Physician Name and Address:  Md, Trauma, MD  Relative Name and Phone Number:  Jed, Kutch [phone]    Current Level of Care: Hospital Recommended Level of Care: Skilled Nursing Facility Prior Approval Number:    Date Approved/Denied:   PASRR Number: [id number]  Discharge Plan: SNF    Current Diagnoses: Patient Active Problem List   Diagnosis Date Noted  . SDH (subdural hematoma) (HCC) 02/20/2020  . Acute on chronic systolic heart failure (HCC) 08/11/2017  . 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 Chronic anticoagulation 08/11/2017  . Primary insomnia 05/10/2017  . Recurrent major depressive disorder, in full remission (HCC) 05/10/2017  . RLS (restless legs syndrome) 02/04/2016  . Vitamin D deficiency 12/21/2015  . Atrioventricular block, complete (HCC) 09/17/2013  . Pacemaker  [place]   . S/P AVR (aortic valve replacement) 08/22/2013  . Hypertension associated with diabetes (HCC) 08/22/2013  . NSTEMI (non-ST elevated myocardial infarction) (HCC) 05/29/2013  . Paroxysmal atrial fibrillation (HCC) 11/20/2012  . CAD, NATIVE VESSEL 12/28/2008  . Hypothyroidism 12/23/2008  . Type 2 diabetes mellitus with diabetic neuropathy, with long-term current use of insulin (HCC) 12/23/2008  . Overweight 12/23/2008  . BLINDNESS, RIGHT EYE 12/23/2008  . Aortic valve disorder 12/23/2008  . Thoracic aortic aneurysm (TAA) (HCC) 12/23/2008  . Chronic kidney disease 12/23/2008  . HEADACHE, CHRONIC 12/23/2008    Orientation RESPIRATION BLADDER Height & Weight     Self, Time, Situation, Place  Normal  Continent Weight: 91.4 kg Height:  5\' 6"  (167.6 cm)  BEHAVIORAL SYMPTOMS/MOOD NEUROLOGICAL BOWEL NUTRITION STATUS      Continent  (Heart healthy carb mod, thin liquids)  AMBULATORY STATUS COMMUNICATION OF NEEDS Skin   Limited Assist Verbally Normal                       Personal Care Assistance Level of Assistance  Bathing, Feeding, Dressing Bathing Assistance: Limited assistance Feeding assistance: Independent Dressing Assistance: Limited assistance     Functional Limitations Info  Sight (glasses) Sight Info: Adequate        SPECIAL CARE FACTORS FREQUENCY  PT (By licensed PT), OT (By licensed OT)     PT Frequency: 5-6 times weekly OT Frequency: 5-6 times weekly            Contractures Contractures Info: Not present    Additional Factors Info  Code Status, Allergies Code Status Info: Full code Allergies Info: Apresoline-N/V; Ace inhibitors- cough; Benadryl-restless legs; Imdur- headache; Metoprolol-Kidney Failure; Nsaids-on coumadin; Valsartan-shuts down kidney           Current Medications (03/02/2020):  This is the current hospital active medication list Current Facility-Administered Medications  Medication Dose Route Frequency Provider Last Rate Last Admin  . acetaminophen (TYLENOL) tablet 1,000 mg  1,000 mg Oral Q6H 03/04/2020, PA-C   1,000 mg at 03/02/20 1201  . ALPRAZolam 03/04/20) tablet 0.25 mg  0.25 mg Oral BID PRN Prudy Feeler, PA-C   0.25 mg at 02/22/20 2152  . amiodarone (PACERONE) tablet 200 mg  200 mg Oral BID 2153, PA-C   200 mg at 03/02/20 0835  .  amLODipine (NORVASC) tablet 2.5 mg  2.5 mg Oral Daily Barnetta Chapel, PA-C   2.5 mg at 03/02/20 0835  . docusate sodium (COLACE) capsule 100 mg  100 mg Oral BID Kinsinger, De Blanch, MD   100 mg at 03/02/20 0835  . insulin aspart (novoLOG) injection 0-15 Units  0-15 Units Subcutaneous TID WC Barnetta Chapel, PA-C   3 Units at 02/26/20 [phone]  . levothyroxine (SYNTHROID) tablet 100 mcg  100  mcg Oral Q0600 Barnetta Chapel, PA-C   100 mcg at 03/02/20 2094  . lidocaine (LIDODERM) 5 % 1 patch  1 patch Transdermal Q24H Juliet Rude, PA-C   1 patch at 03/01/20 1205  . metFORMIN (GLUCOPHAGE) tablet 1,000 mg  1,000 mg Oral BID WC Barnetta Chapel, PA-C   1,000 mg at 03/02/20 0835  . methocarbamol (ROBAXIN) tablet 250 mg  250 mg Oral Q6H Meuth, Brooke A, PA-C      . ondansetron (ZOFRAN-ODT) disintegrating tablet 4 mg  4 mg Oral Q6H PRN Kinsinger, De Blanch, MD       Or  . ondansetron [facility]) injection 4 mg  4 mg Intravenous Q6H PRN Kinsinger, De Blanch, MD      . oxyCODONE (Oxy IR/ROXICODONE) immediate release tablet 15-20 mg  15-20 mg Oral Q4H PRN Barnetta Chapel, PA-C   15 mg at 03/02/20 1313  . polyethylene glycol (MIRALAX / GLYCOLAX) packet 17 g  17 g Oral Daily PRN Maczis, Elmer Sow, PA-C      . sertraline (ZOLOFT) tablet 100 mg  100 mg Oral Daily Barnetta Chapel, PA-C   100 mg at 03/02/20 0835  . traMADol (ULTRAM) tablet 50 mg  50 mg Oral Q6H Barnetta Chapel, PA-C   50 mg at 03/02/20 1201  . traMADol (ULTRAM) tablet 50 mg  50 mg Oral Q6H PRN Jacinto Halim, PA-C   50 mg at 02/27/20 7096  . traZODone (DESYREL) tablet 100 mg  100 mg Oral QHS PRN Barnetta Chapel, PA-C      . Warfarin - Pharmacist Dosing Inpatient   Does not apply q1600 Barnetta Chapel, PA-C   Given at 02/28/20 1521     Discharge Medications: Please see discharge summary for a list of discharge medications.  Relevant Imaging Results:  Relevant Lab Results:   Additional Information SS# [id number]  Glennon Mac, RN

## 2020-03-02 NOTE — TOC Progression Note (Addendum)
Transition of Care [facility]) - Progression Note    Patient Details  Name: Andre Malone MRN: [id number] Date of Birth: 06/19/1951  Transition of Care [facility]) CM/SW Contact  Ella Bodo, RN Phone Number: 03/02/2020, 5:29 PM  Clinical Narrative:  Pt denied by insurance for inpatient rehab.  I met with pt this morning, and he is agreeable to SNF placement for short term rehab as backup plan.  FL2 initiated and patient information faxed out for bed search.   Noted insurance is offering to let CIR resubmit request.  Will follow.      Expected Discharge Plan: IP Rehab Facility Barriers to Discharge: Continued Medical Work up  Expected Discharge Plan and Services Expected Discharge Plan: [place]   Discharge Planning Services: CM Consult Post Acute Care Choice: IP Rehab Living arrangements for the past 2 months: Single Family Home                                       Social Determinants of Health (SDOH) Interventions    Readmission Risk Interventions No flowsheet data found.  Reinaldo Raddle, RN, BSN  Trauma/Neuro ICU Case Manager [phone]

## 2020-03-02 NOTE — Progress Notes (Addendum)
[facility] Progress Note     Subjective: CC-  Robaxin did help some with the pain but he states that this medication made him feel more anxious. Tolerating diet. BM yesterday.  Objective: Vital signs in last 24 hours: Temp:  [97.6 F (36.4 C)-98.3 F (36.8 C)] 97.8 F (36.6 C) (06/29 0758) Pulse Rate:  [59-65] 60 (06/29 0758) Resp:  [16-20] 16 (06/29 0758) BP: (141-154)/(65-75) 145/75 (06/29 0758) SpO2:  [92 %-98 %] 97 % (06/29 0758) Weight:  [91.4 kg] 91.4 kg (06/29 0500) Last BM Date: 03/01/20  Intake/Output from previous day: No intake/output data recorded. Intake/Output this shift: No intake/output data recorded.  PE: Gen:  Alert, NAD, pleasant HEENT: EOM's intact, pupils equal and round, left forehead abrasion  Card:  RRR Pulm:  CTAB, no W/R/R, rate and effort normal Abd: Soft, NT/ND, +BS Ext:  calves soft and nontender Psych: A&Ox4  Skin: no rashes noted, warm and dry   Lab Results:  No results for input(s): WBC, HGB, HCT, PLT in the last 72 hours. BMET Recent Labs    02/29/20 1239  NA 135  K 4.3  CL 102  CO2 24  GLUCOSE 105*  BUN 18  CREATININE 1.28*  CALCIUM 8.9   PT/INR Recent Labs    03/01/20 0713 03/02/20 0456  LABPROT 33.7* 35.4*  INR 3.5* 3.7*   CMP     Component Value Date/Time   NA 135 02/29/2020 1239   NA 141 06/04/2019 1537   K 4.3 02/29/2020 1239   CL 102 02/29/2020 1239   CO2 24 02/29/2020 1239   GLUCOSE 105 (H) 02/29/2020 1239   BUN 18 02/29/2020 1239   BUN 22 06/04/2019 1537   CREATININE 1.28 (H) 02/29/2020 1239   CREATININE 1.11 01/22/2013 1205   CALCIUM 8.9 02/29/2020 1239   PROT 7.8 02/20/2020 1640   PROT 7.1 06/04/2019 1537   ALBUMIN 4.0 02/20/2020 1640   ALBUMIN 4.3 06/04/2019 1537   AST 50 (H) 02/20/2020 1640   ALT 24 02/20/2020 1640   ALKPHOS 66 02/20/2020 1640   BILITOT 0.8 02/20/2020 1640   BILITOT 0.5 06/04/2019 1537   GFRNONAA 57 (L) 02/29/2020 1239   GFRNONAA 71 01/22/2013 1205   GFRAA  >60 02/29/2020 1239   GFRAA 82 01/22/2013 1205   Lipase     Component Value Date/Time   LIPASE 64 (H) 03/28/2010 1733       Studies/Results: No results found.  Anti-infectives: Anti-infectives (From admission, onward)   None       Assessment/Plan Fall Left ribs 5-8 fracture- pain control, IS, mobilization, therapies Small left subdural hematoma-NSGY c/s, no intervention, keppra x7d completed.Back onanticoagulation. Coumadin dosing per pharmacy. DM- Home meds, SSI Chronic pain- Home oxy 15mg  and tramadol. D/c Toradol 6/23 with uptrending Cr. On scheduled tylenol, ultram, and robaxin  HTN- Home meds Depression- zoloft A fib, mAVR- resumed coumadin, pharmacy to dose. INR3.7 CAD- resumed amio and other cardiac home meds Elevated Cr - Cr 1.28 (6/27) - improved, BUN 18. Encourage PO hydration. ABL Anemia -stable  Facial laceration - s/p repair, sutures removed 6/28  FEN -carb mod/HH diet,lasix prn. VTE- Coumadin, trend INR, SCDs ID- none  Dispo -Decrease robaxin dose. Continue PT/OT.Patient is medically stable for discharge toCIR(pending insurance auth./bed availability). POC - Son, [place]    LOS: 11 days    [place], [facility] Surgery 03/02/2020, 8:39 AM Please see Amion for pager number during day hours 7:00am-4:30pm

## 2020-03-02 NOTE — Progress Notes (Signed)
Inpatient Rehab Admissions Coordinator:   Received denial from insurance company for [employer].  Peer to peer completed and per Dr. Bedelia Person, insurance did not agree to overturn the denial; however, they offered the opportunity to resubmit the request.  I am following up on this possibility and will touch base with pt and family tomorrow.   Estill Dooms, PT, DPT Admissions Coordinator [phone] 03/02/20  4:59 PM

## 2020-03-02 NOTE — Progress Notes (Signed)
ANTICOAGULATION CONSULT NOTE - Follow Up Consult  Pharmacy Consult for Warfarin Indication: atrial fibrillation and mAVR  Patient Measurements: Height: 5\' 6"  (167.6 cm) Weight: 91.4 kg (201 lb 8 oz) IBW/kg (Calculated) : 63.8   Vital Signs: Temp: 97.8 F (36.6 C) (06/29 0758) Temp Source: Oral (06/29 0758) BP: 145/75 (06/29 0758) Pulse Rate: 60 (06/29 0758)  Labs: Recent Labs    02/29/20 0427 02/29/20 1239 03/01/20 0713 03/02/20 0456  LABPROT 33.3*  --  33.7* 35.4*  INR 3.4*  --  3.5* 3.7*  CREATININE  --  1.28*  --   --     Estimated Creatinine Clearance: 58.4 mL/min (A) (by C-G formula based on SCr of 1.28 mg/dL (H)).  Assessment:  68 yom presenting to [facility] s/p fall found to have 5 mm SDH with ~2 mm midline shift. 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 Prior pharmacist spoke with patient who states [facility] [place] [facility] [place] for [facility] INR checks. [facility] visit at [facility] was 4/30, unable to obtain 5/30 records as the clinic is closed that day. He does not know his PTA warfarin dose but reports he has not had any changes recently. At last [facility] visit warfarin dose was 2.5 mg Tuesday and Thursday, 5 mg all other days. [facility] notes states patient has an INR goal of 2.5-3.5 which is likely because of his mAVR and risk factor of atrial fibrillation. Prior pharmacist d/t with Trauma PA for INR goal. Patient with atrial fibrillation and mechanical aortic valve so 2.5-3.5 would be ideal but given his new SDH and EKGs from 6/18 showing no active afib, will aim for INR goal of 2-3.   INR remains above goal today at 3.7. No new bleeding noted.  Goal of Therapy:  INR 2-3 Monitor platelets by anticoagulation protocol: Yes   Plan:  No warfarin today Daily INR CBC + BMET in AM *New INR goal 2-3*  7/18, PharmD, BCPS Clinical Pharmacist Please see AMION for all pharmacy numbers 03/02/2020 9:02 AM

## 2020-03-02 NOTE — Plan of Care (Signed)
  Problem: Health Behavior/Discharge Planning: Goal: Ability to manage health-related needs will improve Outcome: Progressing   Problem: Activity: Goal: Risk for activity intolerance will decrease Outcome: Progressing   Problem: Nutrition: Goal: Adequate nutrition will be maintained Outcome: Progressing   Problem: Elimination: Goal: Will not experience complications related to bowel motility Outcome: Progressing Goal: Will not experience complications related to urinary retention Outcome: Progressing   Problem: Safety: Goal: Ability to remain free from injury will improve Outcome: Progressing   Problem: Skin Integrity: Goal: Risk for impaired skin integrity will decrease Outcome: Progressing   

## 2020-03-03 LAB — BASIC METABOLIC PANEL
Anion gap: 9 (ref 5–15)
BUN: 19 mg/dL (ref 8–23)
CO2: 23 mmol/L (ref 22–32)
Calcium: 9.4 mg/dL (ref 8.9–10.3)
Chloride: 105 mmol/L (ref 98–111)
Creatinine, Ser: 1.51 mg/dL — ABNORMAL HIGH (ref 0.61–1.24)
GFR calc Af Amer: 54 mL/min — ABNORMAL LOW (ref >60)
GFR calc non Af Amer: 47 mL/min — ABNORMAL LOW (ref >60)
Glucose, Bld: 110 mg/dL — ABNORMAL HIGH (ref 70–99)
Potassium: 4.2 mmol/L (ref 3.5–5.1)
Sodium: 137 mmol/L (ref 135–145)

## 2020-03-03 LAB — CBC
HCT: 41.9 % (ref 39.0–52.0)
Hemoglobin: 13.6 g/dL (ref 13.0–17.0)
MCH: 30.4 pg (ref 26.0–34.0)
MCHC: 32.5 g/dL (ref 30.0–36.0)
MCV: 93.5 fL (ref 80.0–100.0)
Platelets: 248 10*3/uL (ref 150–400)
RBC: 4.48 MIL/uL (ref 4.22–5.81)
RDW: 14.3 % (ref 11.5–15.5)
WBC: 7.2 10*3/uL (ref 4.0–10.5)
nRBC: 0 % (ref 0.0–0.2)

## 2020-03-03 LAB — GLUCOSE, CAPILLARY
Glucose-Capillary: 94 mg/dL (ref 70–99)
Glucose-Capillary: 98 mg/dL (ref 70–99)
Glucose-Capillary: 96 mg/dL (ref 70–99)
Glucose-Capillary: 127 mg/dL — ABNORMAL HIGH (ref 70–99)

## 2020-03-03 LAB — PROTIME-INR
INR: 2.7 — ABNORMAL HIGH (ref 0.8–1.2)
Prothrombin Time: 27.9 seconds — ABNORMAL HIGH (ref 11.4–15.2)

## 2020-03-03 MED ORDER — WARFARIN SODIUM 2.5 MG PO TABS
2.5000 mg | ORAL_TABLET | Freq: Once | ORAL | Status: AC
  Administered 2020-03-03: 2.5 mg via ORAL
  Filled 2020-03-03: qty 1

## 2020-03-03 MED ORDER — OXYCODONE HCL 5 MG PO TABS
10.0000 mg | ORAL_TABLET | ORAL | Status: DC | PRN
  Administered 2020-03-03: 10 mg via ORAL
  Administered 2020-03-03: 15 mg via ORAL
  Administered 2020-03-03: 10 mg via ORAL
  Administered 2020-03-04 (×5): 15 mg via ORAL
  Administered 2020-03-05: 20 mg via ORAL
  Administered 2020-03-05: 15 mg via ORAL
  Filled 2020-03-03 (×3): qty 3
  Filled 2020-03-03: qty 4
  Filled 2020-03-03 (×4): qty 3
  Filled 2020-03-03 (×2): qty 2
  Filled 2020-03-03: qty 3

## 2020-03-03 NOTE — Plan of Care (Signed)

## 2020-03-03 NOTE — Progress Notes (Signed)
[facility] Progress Note     Subjective: CC-  No new complaints. Pain fairly well controlled. Tolerating diet.  Objective: Vital signs in last 24 hours: Temp:  [98 F (36.7 C)-98.4 F (36.9 C)] 98.1 F (36.7 C) (06/30 0803) Pulse Rate:  [59-75] 59 (06/30 0803) Resp:  [16-20] 16 (06/30 0803) BP: (127-143)/(68-81) 127/68 (06/30 0803) SpO2:  [90 %-98 %] 94 % (06/30 0803) Weight:  [90.5 kg] 90.5 kg (06/30 0349) Last BM Date: 02/29/20  Intake/Output from previous day: 06/29 0701 - 06/30 0700 In: 720 [P.O.:720] Out: -  Intake/Output this shift: No intake/output data recorded.  PE: Gen: Alert, NAD, pleasant HEENT: EOM's intact, pupils equal and round, left forehead abrasion  Card: RRR Pulm: CTAB, no W/R/R, rate and effort normal Abd: Soft, NT/ND, +BS Ext: calves soft and nontender without edema Psych: A&Ox4  Skin: no rashes noted, warm and dry    Lab Results:  No results for input(s): WBC, HGB, HCT, PLT in the last 72 hours. BMET Recent Labs    02/29/20 1239  NA 135  K 4.3  CL 102  CO2 24  GLUCOSE 105*  BUN 18  CREATININE 1.28*  CALCIUM 8.9   PT/INR Recent Labs    03/01/20 0713 03/02/20 0456  LABPROT 33.7* 35.4*  INR 3.5* 3.7*   CMP     Component Value Date/Time   NA 135 02/29/2020 1239   NA 141 06/04/2019 1537   K 4.3 02/29/2020 1239   CL 102 02/29/2020 1239   CO2 24 02/29/2020 1239   GLUCOSE 105 (H) 02/29/2020 1239   BUN 18 02/29/2020 1239   BUN 22 06/04/2019 1537   CREATININE 1.28 (H) 02/29/2020 1239   CREATININE 1.11 01/22/2013 1205   CALCIUM 8.9 02/29/2020 1239   PROT 7.8 02/20/2020 1640   PROT 7.1 06/04/2019 1537   ALBUMIN 4.0 02/20/2020 1640   ALBUMIN 4.3 06/04/2019 1537   AST 50 (H) 02/20/2020 1640   ALT 24 02/20/2020 1640   ALKPHOS 66 02/20/2020 1640   BILITOT 0.8 02/20/2020 1640   BILITOT 0.5 06/04/2019 1537   GFRNONAA 57 (L) 02/29/2020 1239   GFRNONAA 71 01/22/2013 1205   GFRAA >60 02/29/2020 1239   GFRAA 82  01/22/2013 1205   Lipase     Component Value Date/Time   LIPASE 64 (H) 03/28/2010 1733       Studies/Results: No results found.  Anti-infectives: Anti-infectives (From admission, onward)   None       Assessment/Plan Fall Left ribs 5-8 fracture- pain control, IS, mobilization, therapies Small left subdural hematoma-NSGY c/s, no intervention, keppra x7dcompleted.Back onanticoagulation. Coumadin dosing per pharmacy. DM- Home meds, SSI Chronic pain- Home oxy 15mg  and tramadol. D/c Toradol 6/23 with uptrending Cr.On scheduled tylenol and ultram. Did not tolerate robaxin HTN- Home meds Depression- zoloft A fib, mAVR- resumed coumadin, pharmacy to dose. INR3.7 (6/29) CAD- resumed amio and other cardiac home meds Elevated Cr - Cr1.28 (6/27) -improved, BUN18.Encourage PO hydration. ABL Anemia -stable Faciallaceration -s/p repair, sutures removed 6/28  FEN -carb mod/HH diet,lasix prn. VTE- Coumadin, trend INR, SCDs ID- none  Dispo -Insurance denied CIR. Case management has started looking into SNF options, patient agreeable. Medically stable for discharge to SNF when bed available.   LOS: 12 days    7/28, [facility] Surgery 03/03/2020, 8:16 AM Please see Amion for pager number during day hours 7:00am-4:30pm

## 2020-03-03 NOTE — Progress Notes (Signed)
Inpatient Rehab Admissions Coordinator:   Pt's request for CIR prior [facility] has been denied.  Pt/family aware and pursuing short term SNF for rehab.  CIR will sign off at this time.   Estill Dooms, PT, DPT Admissions Coordinator [phone] 03/03/20  2:15 PM

## 2020-03-03 NOTE — TOC Progression Note (Signed)
Transition of Care [facility]) - Progression Note    Patient Details  Name: Andre Malone MRN: [id number] Date of Birth: 1951-01-16  Transition of Care [facility]) CM/SW Contact  Astrid Drafts Berna Spare, RN Phone Number: 03/03/2020, 11:52 AM  Clinical Narrative:   Pt and son given bed offers for SNF; son discussing offers and investigating facilities and will call me with decision shortly.      Expected Discharge Plan: Skilled Nursing Facility Barriers to Discharge: Continued Medical Work up  Expected Discharge Plan and Services Expected Discharge Plan: Skilled Nursing Facility   Discharge Planning Services: CM Consult Post Acute Care Choice: Skilled Nursing Facility Living arrangements for the past 2 months: Single Family Home                                       Social Determinants of Health (SDOH) Interventions    Readmission Risk Interventions No flowsheet data found.  Quintella Baton, RN, BSN  Trauma/Neuro ICU Case Manager [phone]

## 2020-03-03 NOTE — Progress Notes (Addendum)
Physical Therapy Treatment Patient Details Name: Andre Malone MRN: [id number] DOB: 1951-01-19 Today's Date: 03/03/2020    History of Present Illness 69 yo male was carrying wood and tools down stairs when he missed his footing and fell.  Workup revealed  very small SDH and rib fx 5-8. PMH significant for blind in Right eye, CAD, s/p AVR and pacemaker, DM, HTN    PT Comments    Pt supine in bed on arrival.  Pt remains upset about rehab decision.  With encouragement he participated in PT session and performed all aspects of mobility with decreased assistance.  Plan to trial gait without RW next session.  He continues to benefit from post acute rehab as he will be home alone and continues to struggle with ADLs.      Follow Up Recommendations  SNF     Equipment Recommendations  Other (comment) (TBD)    Recommendations for Other Services       Precautions / Restrictions Precautions Precautions: Fall Precaution Comments: Rib fractures Restrictions Weight Bearing Restrictions: No    Mobility  Bed Mobility Overal bed mobility: Needs Assistance Bed Mobility: Rolling;Sidelying to Sit;Supine to Sit Rolling: Supervision Sidelying to sit: Supervision     Sit to sidelying: Supervision General bed mobility comments: Cues for sequencing and safety.  Transfers Overall transfer level: Needs assistance Equipment used: Rolling walker (2 wheeled) Transfers: Sit to/from Stand Sit to Stand: Supervision         General transfer comment: Cues for hand placement to and from seated surface  Ambulation/Gait Ambulation/Gait assistance: Supervision Gait Distance (Feet): 130 Feet (x2) Assistive device: Rolling walker (2 wheeled) Gait Pattern/deviations: Step-through pattern;Decreased stride length;Shuffle Gait velocity: decr   General Gait Details: Cues for sequencing and upper trunk control, will trial without device next session.   Stairs             Wheelchair  Mobility    Modified Rankin (Stroke Patients Only)       Balance Overall balance assessment: Needs assistance Sitting-balance support: Feet supported Sitting balance-Leahy Scale: Fair       Standing balance-Leahy Scale: Fair                              Cognition Arousal/Alertness: Awake/alert Behavior During Therapy: WFL for tasks assessed/performed Overall Cognitive Status: Within Functional Limits for tasks assessed (somewhat depressed.)                                        Exercises      General Comments        Pertinent Vitals/Pain Pain Assessment: 0-10 Faces Pain Scale: Hurts whole lot Pain Location: back, ribs, and neck Pain Descriptors / Indicators: Grimacing;Guarding;Sore Pain Intervention(s): Monitored during session;Repositioned    Home Living                      Prior Function            PT Goals (current goals can now be found in the care plan section) Acute Rehab PT Goals Patient Stated Goal: To return home.  Potential to Achieve Goals: Good Progress towards PT goals: Progressing toward goals    Frequency    Min 3X/week      PT Plan Discharge plan needs to be updated    Co-evaluation  AM-PAC PT "6 Clicks" Mobility   Outcome Measure  Help needed turning from your back to your side while in a flat bed without using bedrails?: A Little Help needed moving from lying on your back to sitting on the side of a flat bed without using bedrails?: A Lot Help needed moving to and from a bed to a chair (including a wheelchair)?: A Little Help needed standing up from a chair using your arms (e.g., wheelchair or bedside chair)?: A Little Help needed to walk in hospital room?: A Little Help needed climbing 3-5 steps with a railing? : A Little 6 Click Score: 17    End of Session Equipment Utilized During Treatment: Gait belt Activity Tolerance: Patient tolerated treatment well;Patient  limited by pain Patient left: with call bell/phone within reach;in chair;with chair alarm set Nurse Communication: Mobility status PT Visit Diagnosis: Other abnormalities of gait and mobility (R26.89);Pain;History of falling (Z91.81) Pain - Right/Left: Left Pain - part of body:  (ribs)     Time: 7829-5621 PT Time Calculation (min) (ACUTE ONLY): 16 min  Charges:  $Gait Training: 8-22 mins                     Bonney Leitz , PTA Acute Rehabilitation Services Pager [phone] Office [phone]     Krysia Zahradnik Artis Delay 03/03/2020, 5:35 PM

## 2020-03-03 NOTE — Care Management (Addendum)
Patient's son Molli Hazard called with SNF choice, he would like [facility]. Tammy at [facility] notified. Clarified with patient and family that patient has received 1 of 2 COVID vaccines. Patient will need quarantine upon initial transfer to SNF. Per Tammy next quarantine bed will be available Monday. Tammy will check with director to determine if any other options are available. In the mean time I have spoken with Molli Hazard and emailed him SNFs that have offered a bed and the medicare.gov website. He is to review and call me back today with a second SNF choice.    Once final decision made, SNF will begin insurance auth.  Received call from Tammy, they are able to offer bed now, she will begin insurance auth with [facility]. Updated son Molli Hazard, made him aware that there will be a 14 day quarantine period, and he will inform the patient of this.

## 2020-03-03 NOTE — Progress Notes (Addendum)
OT Cancellation Note  Patient Details Name: Andre Malone MRN: [id number] DOB: 13-Jun-1951   Cancelled Treatment:    Reason Eval/Treat Not Completed: Patient declined. Prior to OT entry, CM/SW provided patient with list of facilities for short-term SNF rehab as patient was denied CIR placement. Patient states that he's upset about this change in d/c planning. Will attempt this afternoon if time.   Kallie Edward OTR/L Supplemental OT, Department of rehab services [phone]  Tama Headings 03/03/2020, 10:34 AM

## 2020-03-03 NOTE — Discharge Summary (Signed)
[facility] Discharge Summary   Patient ID: Andre Malone MRN: [id number] DOB/AGE: 1950/12/08 69 y.o.  Admit date: 02/20/2020 Discharge date: 03/05/2020  Admitting Diagnosis: Fall  Left rib fractures 5-8 40mm SDH Faciallaceration  DM Chronic pain HTN Depression A fib, mAVR CAD   Discharge Diagnosis Fall Left ribs 5-8 fractures Small left subdural hematoma  Faciallaceration  DM Chronic pain HTN Depression A fib, mAVR CAD   Consultants Neurosurgery  Imaging: No results found.  Procedures Dr. Graciella Freer (02/20/2020) - Facial laceration repair  Hospital Course:  Andre Malone is a 69yo male PMH DM, CAD, Chronic pain, HTN, Depression, A fib, mAVR on coumadin who was transferred from [facility] ED to [facility] for admission to the trauma service after falling. Patient was carrying wood and tools down stairs when he missed his footing and fell. He denies loss of consciousness. He complains of pain over his left chest. Pain is worse with deep breathing and coughing.  Workup showed Left rib fractures 5-8 and 77mm SDH.  He was given Kcentra and vitamin K for coumadin reversal. Facial laceration repaired in the ED. Patient was transferred to [facility]. Neurosurgery was consulted for closed head injury and recommended holding anticoagulation, keppra x7 days. Repeat CT scan revealed unchanged tiny SDH. Hemoglobin remained stable. Ultimately his anticoagulation was restarted on 6/20.  Rib fractures were treated with pulmonary toilet and pain control. Initially his pain control was very difficult due to history of chronic narcotic use, but this did improve with multimodal therapies and times.  Patient worked with therapies during this admission who recommended inpatient rehab when medically stable for discharge. Unfortunately this was denied by his insurance and instead case management looked into SNF.  On 03/05/20, the patient was tolerating diet, ambulating well, pain well  controlled, vital signs stable and felt stable for discharge to SNF.  Patient will follow up as below and knows to call with questions or concerns.   Of note, pharmacy was consulted during admission for assistance with management and dosing of coumadin. INR goal is 2-3. INR 2.6 on 03/05/20.  I have personally reviewed the patients medication history on the  controlled substance database.    Allergies as of 03/05/2020      Reactions   Apresoline [hydralazine] Nausea And Vomiting   Ace Inhibitors Other (See Comments)   cough   Benadryl [diphenhydramine Hcl] Other (See Comments)   Makes restless legs worse   Imdur [isosorbide Dinitrate] Other (See Comments)   headache   Metoprolol Other (See Comments)   Kidney failure   Nsaids Other (See Comments)   REACTION: Currently taking Coumadin   Valsartan Other (See Comments)   REACTION:shuts down Kidney      Medication List    STOP taking these medications   ALPRAZolam 0.25 MG tablet Commonly known as: XANAX   Basaglar KwikPen 100 UNIT/ML   traMADol 50 MG tablet Commonly known as: ULTRAM     TAKE these medications   acetaminophen 500 MG tablet Commonly known as: TYLENOL Take 2 tablets (1,000 mg total) by mouth every 6 (six) hours.   amiodarone 200 MG tablet Commonly known as: PACERONE TAKE 1 TABLET BY MOUTH 2 TIMES DAILY. PLEASE SCHEDULE APPT FOR FUTURE REFILLS. 1ST ATTEMPT What changed:   how much to take  how to take this  when to take this  additional instructions   amLODipine 5 MG tablet Commonly known as: NORVASC Take 0.5 tablets (2.5 mg total) by mouth daily.   aspirin  81 MG EC tablet Take 1 tablet (81 mg total) by mouth daily.   docusate sodium 100 MG capsule Commonly known as: COLACE Take 1 capsule (100 mg total) by mouth 2 (two) times daily.   furosemide 40 MG tablet Commonly known as: LASIX Take 1 tablet (40 mg total) by mouth daily as needed for edema. What changed:   when to take this  reasons to  take this   insulin aspart 100 UNIT/ML injection Commonly known as: novoLOG Inject 0-15 Units into the skin 3 (three) times daily with meals.   Lactase Enzyme 3000 units tablet Generic drug: lactase Take 9,000 Units by mouth daily as needed (when consuming dairy products).   levothyroxine 100 MCG tablet Commonly known as: SYNTHROID Take 1 tablet (100 mcg total) by mouth daily. (Needs to be seen before next refill   lidocaine 5 % Commonly known as: LIDODERM Place 1 patch onto the skin daily. Remove & Discard patch within 12 hours or as directed by MD   metFORMIN 1000 MG tablet Commonly known as: GLUCOPHAGE TAKE 1 TABLET (1,000 MG TOTAL) BY MOUTH 2 (TWO) TIMES DAILY WITH A MEAL. What changed:   how much to take  how to take this  when to take this  additional instructions   multivitamin with minerals Tabs tablet Take 1 tablet by mouth daily.   Oxycodone HCl 10 MG Tabs Take 1-2 tablets (10-20 mg total) by mouth every 4 (four) hours as needed. What changed:   medication strength  how much to take  when to take this  reasons to take this   polyethylene glycol 17 g packet Commonly known as: MIRALAX / GLYCOLAX Take 17 g by mouth daily as needed for mild constipation.   pravastatin 40 MG tablet Commonly known as: PRAVACHOL Take 1 tablet (40 mg total) by mouth daily.   sertraline 100 MG tablet Commonly known as: ZOLOFT Take 1 tablet (100 mg total) by mouth daily.   traZODone 100 MG tablet Commonly known as: DESYREL TAKE 1 TABLET BY MOUTH EVERYDAY AT BEDTIME What changed:   how much to take  how to take this  when to take this  reasons to take this  additional instructions   warfarin 2.5 MG tablet Commonly known as: COUMADIN Take as directed. If you are unsure how to take this medication, talk to your nurse or doctor. Original instructions: Take 1 tablet (2.5 mg total) by mouth daily. Please check INR by Monday 03/08/20 What changed:   medication  strength  how much to take  how to take this  when to take this  additional instructions            Durable Medical Equipment  (From admission, onward)         Start     Ordered   02/22/20 0852  For home use only DME Walker rolling  Once       Question Answer Comment  Walker: With 5 Inch Wheels   Patient needs a walker to treat with the following condition Unsteady gait      02/22/20 0853            Follow-up Information    Coletta Memos, MD. Schedule an appointment as soon as possible for a visit in 2 week(s).   Specialty: Neurosurgery Contact information: [address] [place] [address] [phone]        Bennie Pierini, FNP. Call.   Specialty: Family Medicine Why: call to arrange post-hospitalization  follow up appointment with your primary care physician Contact information: [address] [place] [address] [phone]               Signed: Carlena Bjornstad, [facility] 03/03/2020, 1:46 PM Please see Amion for pager number during day hours 7:00am-4:30pm

## 2020-03-03 NOTE — Progress Notes (Signed)
SLP Cancellation Note  Patient Details Name: Andre Malone MRN: [id number] DOB: [birth year]   Cancelled treatment:       Pt politely declined therapy, as well as ordering meal from [employer], saying he had a lot on his mind after hearing about CIR denial from insurance company.  He apologized and appropriately asked for some time alone to think.  We will continue efforts.  Mikia Delaluz L. Samson Frederic, MA CCC/SLP Acute Rehabilitation Services Office number [phone] Pager [phone]'    Carolan Shiver 03/03/2020, 10:54 AM

## 2020-03-03 NOTE — Progress Notes (Signed)
ANTICOAGULATION CONSULT NOTE - Follow Up Consult  Pharmacy Consult for Coumadin Indication: afib and h/o mvr  Allergies  Allergen Reactions  . Apresoline [Hydralazine] Nausea And Vomiting  . Ace Inhibitors Other (See Comments)    cough  . Benadryl [Diphenhydramine Hcl] Other (See Comments)    Makes restless legs worse  . Imdur [Isosorbide Dinitrate] Other (See Comments)    headache  . Metoprolol Other (See Comments)    Kidney failure  . Nsaids Other (See Comments)    REACTION: Currently taking Coumadin  . Valsartan Other (See Comments)    REACTION:shuts down Kidney    Patient Measurements: Height: 5\' 6"  (167.6 cm) Weight: 90.5 kg (199 lb 8.3 oz) IBW/kg (Calculated) : 63.8  Vital Signs: Temp: 98.1 F (36.7 C) (06/30 0803) Temp Source: Oral (06/30 0803) BP: 127/68 (06/30 0803) Pulse Rate: 59 (06/30 0803)  Labs: Recent Labs    02/29/20 1239 03/01/20 0713 03/02/20 0456 03/03/20 0815  HGB  --   --   --  13.6  HCT  --   --   --  41.9  PLT  --   --   --  248  LABPROT  --  33.7* 35.4* 27.9*  INR  --  3.5* 3.7* 2.7*  CREATININE 1.28*  --   --  1.51*    Estimated Creatinine Clearance: 49.3 mL/min (A) (by C-G formula based on SCr of 1.51 mg/dL (H)).  Assessment: Anticoag: Warfarin for hx afib and mAVR with dmit INR 2. S/p fall w/ 5 mm SDH (small per NS, no surg)  - INR 3.7>>2.7 down today, no new bleeding noted. CBC WNL  -  unclear PTA dosing, likely 2.5 mg Tuesdays, Thursdays and 5 mg all other days (as on med hx) - new target INR is 2-3 d/t SDH even though he has afib and mAVR  6/18: SDH, INR 2.0 >Vit K 10 mg IV + KCentra > INR 1.3  Goal of Therapy:  INR 2-3 Monitor platelets by anticoagulation protocol: Yes   Plan:  Please make sure new INR goal is communicated to outpatient providers Coumadin 2.5mg  po x 1 tonight Daily INR    Shannon Balthazar S. 7/18, PharmD, BCPS Clinical Staff Pharmacist [email or web address] Merilynn Finland, Lug[facility] Assefa Stillinger 03/03/2020,10:04 AM

## 2020-03-04 LAB — GLUCOSE, CAPILLARY
Glucose-Capillary: 77 mg/dL (ref 70–99)
Glucose-Capillary: 88 mg/dL (ref 70–99)
Glucose-Capillary: 94 mg/dL (ref 70–99)

## 2020-03-04 LAB — SARS CORONAVIRUS 2 BY RT PCR (HOSPITAL ORDER, PERFORMED IN ~~LOC~~ HOSPITAL LAB): SARS Coronavirus 2: NEGATIVE

## 2020-03-04 LAB — PROTIME-INR
INR: 2.7 — ABNORMAL HIGH (ref 0.8–1.2)
Prothrombin Time: 27.5 s — ABNORMAL HIGH (ref 11.4–15.2)

## 2020-03-04 MED ORDER — OXYCODONE HCL 10 MG PO TABS: 10.0000 mg | ORAL_TABLET | ORAL | 0 refills | Status: DC | PRN

## 2020-03-04 MED ORDER — POLYETHYLENE GLYCOL 3350 17 G PO PACK: 17.0000 g | PACK | Freq: Every day | ORAL | 0 refills | Status: AC | PRN

## 2020-03-04 MED ORDER — LIDOCAINE 5 % EX PTCH: 1.0000 | MEDICATED_PATCH | CUTANEOUS | 0 refills | Status: DC

## 2020-03-04 MED ORDER — FUROSEMIDE 40 MG PO TABS: 40.0000 mg | ORAL_TABLET | Freq: Every day | ORAL | 1 refills | Status: DC | PRN

## 2020-03-04 MED ORDER — WARFARIN SODIUM 2.5 MG PO TABS
2.5000 mg | ORAL_TABLET | Freq: Once | ORAL | Status: AC
  Administered 2020-03-04: 2.5 mg via ORAL
  Filled 2020-03-04: qty 1

## 2020-03-04 MED ORDER — DOCUSATE SODIUM 100 MG PO CAPS: 100.0000 mg | ORAL_CAPSULE | Freq: Two times a day (BID) | ORAL | 0 refills | Status: DC

## 2020-03-04 MED ORDER — ACETAMINOPHEN 500 MG PO TABS: 1000.0000 mg | ORAL_TABLET | Freq: Four times a day (QID) | ORAL | 0 refills | Status: AC

## 2020-03-04 NOTE — Progress Notes (Signed)
[facility] Progress Note     Subjective: CC-  Slept ok last night. Pain control adequate. No new issues.  Denies SOB or extremity edema. Decided upon [facility], working on English as a second language teacher.  Objective: Vital signs in last 24 hours: Temp:  [97.5 F (36.4 C)-98.3 F (36.8 C)] 97.7 F (36.5 C) (07/01 0501) Pulse Rate:  [60-69] 60 (07/01 0501) Resp:  [16-17] 17 (07/01 0501) BP: (123-149)/(66-73) 139/73 (07/01 0501) SpO2:  [92 %-95 %] 93 % (07/01 0501) Last BM Date: 03/02/20  Intake/Output from previous day: 06/30 0701 - 07/01 0700 In: 720 [P.O.:720] Out: -  Intake/Output this shift: No intake/output data recorded.  PE: Gen: Alert, NAD, pleasant HEENT: EOM's intact, pupils equal and round, left forehead abrasion clean Card: RRR Pulm: CTAB, no W/R/R, rate and effort normal Abd: Soft, NT/ND, +BS Ext: calves soft and nontender without edema Psych: A&Ox4  Skin: no rashes noted, warm and dry    Lab Results:  Recent Labs    03/03/20 0815  WBC 7.2  HGB 13.6  HCT 41.9  PLT 248   BMET Recent Labs    03/03/20 0815  NA 137  K 4.2  CL 105  CO2 23  GLUCOSE 110*  BUN 19  CREATININE 1.51*  CALCIUM 9.4   PT/INR Recent Labs    03/02/20 0456 03/03/20 0815  LABPROT 35.4* 27.9*  INR 3.7* 2.7*   CMP     Component Value Date/Time   NA 137 03/03/2020 0815   NA 141 06/04/2019 1537   K 4.2 03/03/2020 0815   CL 105 03/03/2020 0815   CO2 23 03/03/2020 0815   GLUCOSE 110 (H) 03/03/2020 0815   BUN 19 03/03/2020 0815   BUN 22 06/04/2019 1537   CREATININE 1.51 (H) 03/03/2020 0815   CREATININE 1.11 01/22/2013 1205   CALCIUM 9.4 03/03/2020 0815   PROT 7.8 02/20/2020 1640   PROT 7.1 06/04/2019 1537   ALBUMIN 4.0 02/20/2020 1640   ALBUMIN 4.3 06/04/2019 1537   AST 50 (H) 02/20/2020 1640   ALT 24 02/20/2020 1640   ALKPHOS 66 02/20/2020 1640   BILITOT 0.8 02/20/2020 1640   BILITOT 0.5 06/04/2019 1537   GFRNONAA 47 (L) 03/03/2020 0815    GFRNONAA 71 01/22/2013 1205   GFRAA 54 (L) 03/03/2020 0815   GFRAA 82 01/22/2013 1205   Lipase     Component Value Date/Time   LIPASE 64 (H) 03/28/2010 1733       Studies/Results: No results found.  Anti-infectives: Anti-infectives (From admission, onward)   None       Assessment/Plan Fall Left ribs 5-8 fracture- pain control, IS, mobilization, therapies Small left subdural hematoma-NSGY c/s, no intervention, keppra x7dcompleted.Back onanticoagulation. Coumadin dosing per pharmacy. DM- Home meds, SSI Chronic pain- Home oxy 15mg  and tramadol. D/c Toradol 6/23 with uptrending Cr.On scheduled tylenol andultram. Did not tolerate robaxin HTN- Home meds Depression- zoloft A fib, mAVR- resumed coumadin, pharmacy to dose. INR2.7 (6/29), goal 2-3 CAD- resumed amio and other cardiac home meds Elevated Cr - Cr1.28 (6/27) -improved, BUN18.Encourage PO hydration. ABL Anemia -stable Faciallaceration -s/p repair, suturesremoved6/28  FEN -carb mod/HH diet,lasix prn. VTE- Coumadin, trend INR, SCDs ID- none  Dispo -Medically stable for discharge to SNF when bed available. Covid test sent. Patient is asking about getting his second covid vaccine, he received Moderna but cannot remember which day.   LOS: 13 days    02-11-1982, [facility] 03/04/2020, 8:46 AM Please see Amion for pager number  during day hours 7:00am-4:30pm

## 2020-03-04 NOTE — Progress Notes (Signed)
ANTICOAGULATION CONSULT NOTE - Follow Up Consult  Pharmacy Consult for Coumadin Indication: afib and h/o mvr  Allergies  Allergen Reactions  . Apresoline [Hydralazine] Nausea And Vomiting  . Ace Inhibitors Other (See Comments)    cough  . Benadryl [Diphenhydramine Hcl] Other (See Comments)    Makes restless legs worse  . Imdur [Isosorbide Dinitrate] Other (See Comments)    headache  . Metoprolol Other (See Comments)    Kidney failure  . Nsaids Other (See Comments)    REACTION: Currently taking Coumadin  . Valsartan Other (See Comments)    REACTION:shuts down Kidney    Patient Measurements: Height: 5\' 6"  (167.6 cm) Weight: 90.5 kg (199 lb 8.3 oz) IBW/kg (Calculated) : 63.8  Vital Signs: Temp: 97.7 F (36.5 C) (07/01 0501) Temp Source: Oral (07/01 0501) BP: 139/73 (07/01 0501) Pulse Rate: 60 (07/01 0501)  Labs: Recent Labs    03/02/20 0456 03/03/20 0815 03/04/20 0908  HGB  --  13.6  --   HCT  --  41.9  --   PLT  --  248  --   LABPROT 35.4* 27.9* 27.5*  INR 3.7* 2.7* 2.7*  CREATININE  --  1.51*  --     Estimated Creatinine Clearance: 49.3 mL/min (A) (by C-G formula based on SCr of 1.51 mg/dL (H)).  Assessment: Anticoag: Warfarin for hx afib and mAVR with dmit INR 2. S/p fall w/ 5 mm SDH (small per NS, no surg)  - INR 2.7 again today, no new bleeding noted. CBC WNL  - unclear PTA dosing, likely 2.5 mg Tuesdays, Thursdays and 5 mg all other days (as on med hx) - new target INR is 2-3 d/t SDH even though he has afib and mAVR  6/18: SDH, INR 2.0 >Vit K 10 mg IV + KCentra > INR 1.3  Goal of Therapy:  INR 2-3 Monitor platelets by anticoagulation protocol: Yes   Plan:  Please make sure new INR goal is communicated to outpatient providers Coumadin 2.5mg  po x 1 again tonight Daily INR    Paitlyn Mcclatchey S. 7/18, PharmD, BCPS Clinical Staff Pharmacist [email or web address] Merilynn Finland, Merilynn Finland 03/04/2020,10:17 AM

## 2020-03-05 DIAGNOSIS — R0902 Hypoxemia: Secondary | ICD-10-CM | POA: Diagnosis not present

## 2020-03-05 DIAGNOSIS — R278 Other lack of coordination: Secondary | ICD-10-CM | POA: Diagnosis not present

## 2020-03-05 DIAGNOSIS — R5381 Other malaise: Secondary | ICD-10-CM | POA: Diagnosis not present

## 2020-03-05 DIAGNOSIS — S2242XA Multiple fractures of ribs, left side, initial encounter for closed fracture: Secondary | ICD-10-CM | POA: Diagnosis not present

## 2020-03-05 DIAGNOSIS — S065X0A Traumatic subdural hemorrhage without loss of consciousness, initial encounter: Secondary | ICD-10-CM | POA: Diagnosis not present

## 2020-03-05 DIAGNOSIS — D649 Anemia, unspecified: Secondary | ICD-10-CM | POA: Diagnosis not present

## 2020-03-05 DIAGNOSIS — S2242XD Multiple fractures of ribs, left side, subsequent encounter for fracture with routine healing: Secondary | ICD-10-CM | POA: Diagnosis not present

## 2020-03-05 DIAGNOSIS — M25512 Pain in left shoulder: Secondary | ICD-10-CM | POA: Diagnosis not present

## 2020-03-05 DIAGNOSIS — E119 Type 2 diabetes mellitus without complications: Secondary | ICD-10-CM | POA: Diagnosis not present

## 2020-03-05 DIAGNOSIS — M6281 Muscle weakness (generalized): Secondary | ICD-10-CM | POA: Diagnosis not present

## 2020-03-05 DIAGNOSIS — I10 Essential (primary) hypertension: Secondary | ICD-10-CM | POA: Diagnosis not present

## 2020-03-05 DIAGNOSIS — R0602 Shortness of breath: Secondary | ICD-10-CM | POA: Diagnosis not present

## 2020-03-05 DIAGNOSIS — Z7401 Bed confinement status: Secondary | ICD-10-CM | POA: Diagnosis not present

## 2020-03-05 DIAGNOSIS — M2550 Pain in unspecified joint: Secondary | ICD-10-CM | POA: Diagnosis not present

## 2020-03-05 DIAGNOSIS — I4891 Unspecified atrial fibrillation: Secondary | ICD-10-CM | POA: Diagnosis not present

## 2020-03-05 DIAGNOSIS — R52 Pain, unspecified: Secondary | ICD-10-CM | POA: Diagnosis not present

## 2020-03-05 LAB — PROTIME-INR
INR: 2.6 — ABNORMAL HIGH (ref 0.8–1.2)
Prothrombin Time: 27.1 s — ABNORMAL HIGH (ref 11.4–15.2)

## 2020-03-05 LAB — GLUCOSE, CAPILLARY
Glucose-Capillary: 105 mg/dL — ABNORMAL HIGH (ref 70–99)
Glucose-Capillary: 79 mg/dL (ref 70–99)

## 2020-03-05 MED ORDER — WARFARIN SODIUM 2.5 MG PO TABS
2.5000 mg | ORAL_TABLET | Freq: Once | ORAL | Status: DC
  Filled 2020-03-05: qty 1

## 2020-03-05 MED ORDER — INSULIN ASPART 100 UNIT/ML ~~LOC~~ SOLN: 0.0000 [IU] | Freq: Three times a day (TID) | SUBCUTANEOUS | 0 refills | Status: DC

## 2020-03-05 MED ORDER — WARFARIN SODIUM 2.5 MG PO TABS: 2.5000 mg | ORAL_TABLET | Freq: Every day | ORAL | 0 refills | Status: DC

## 2020-03-05 NOTE — Progress Notes (Signed)
[facility] Progress Note     Subjective: CC-  No new complaints.  INR 2.6 Denies SOB or extremity edema.  Objective: Vital signs in last 24 hours: Temp:  [98.2 F (36.8 C)-98.3 F (36.8 C)] 98.3 F (36.8 C) (07/02 0329) Pulse Rate:  [59-60] 59 (07/02 0329) Resp:  [14-16] 14 (07/02 0329) BP: (105-112)/(62-66) 112/66 (07/02 0329) SpO2:  [90 %-97 %] 97 % (07/02 0329) Last BM Date: 03/02/20  Intake/Output from previous day: 07/01 0701 - 07/02 0700 In: 720 [P.O.:720] Out: -  Intake/Output this shift: No intake/output data recorded.  PE: Gen: Alert, NAD, pleasant HEENT: EOM's intact, pupils equal and round, left forehead abrasion clean Card: RRR Pulm: CTAB, no W/R/R, rate and effort normal Abd: Soft, NT/ND, +BS Ext: calves soft and nontenderwithout edema Psych: A&Ox4  Skin: no rashes noted, warm and dry   Lab Results:  Recent Labs    03/03/20 0815  WBC 7.2  HGB 13.6  HCT 41.9  PLT 248   BMET Recent Labs    03/03/20 0815  NA 137  K 4.2  CL 105  CO2 23  GLUCOSE 110*  BUN 19  CREATININE 1.51*  CALCIUM 9.4   PT/INR Recent Labs    03/04/20 0908 03/05/20 0353  LABPROT 27.5* 27.1*  INR 2.7* 2.6*   CMP     Component Value Date/Time   NA 137 03/03/2020 0815   NA 141 06/04/2019 1537   K 4.2 03/03/2020 0815   CL 105 03/03/2020 0815   CO2 23 03/03/2020 0815   GLUCOSE 110 (H) 03/03/2020 0815   BUN 19 03/03/2020 0815   BUN 22 06/04/2019 1537   CREATININE 1.51 (H) 03/03/2020 0815   CREATININE 1.11 01/22/2013 1205   CALCIUM 9.4 03/03/2020 0815   PROT 7.8 02/20/2020 1640   PROT 7.1 06/04/2019 1537   ALBUMIN 4.0 02/20/2020 1640   ALBUMIN 4.3 06/04/2019 1537   AST 50 (H) 02/20/2020 1640   ALT 24 02/20/2020 1640   ALKPHOS 66 02/20/2020 1640   BILITOT 0.8 02/20/2020 1640   BILITOT 0.5 06/04/2019 1537   GFRNONAA 47 (L) 03/03/2020 0815   GFRNONAA 71 01/22/2013 1205   GFRAA 54 (L) 03/03/2020 0815   GFRAA 82 01/22/2013 1205    Lipase     Component Value Date/Time   LIPASE 64 (H) 03/28/2010 1733       Studies/Results: No results found.  Anti-infectives: Anti-infectives (From admission, onward)   None       Assessment/Plan Fall Left ribs 5-8 fracture- pain control, IS, mobilization, therapies Small left subdural hematoma-NSGY c/s, no intervention, keppra x7dcompleted.Back onanticoagulation. Coumadin dosing per pharmacy. DM- Home meds, SSI Chronic pain- Home oxy 15mg  and tramadol. D/c Toradol 6/23 with uptrending Cr.On scheduled tylenol. Did not tolerate robaxin HTN- Home meds Depression- zoloft A fib, mAVR- resumed coumadin, pharmacy to dose. INR2.6, goal 2-3 CAD- resumed amio and other cardiac home meds Elevated Cr - Cr1.28 (6/27) -improved, BUN18.Encourage PO hydration. ABL Anemia -stable Faciallaceration -s/p repair, suturesremoved6/28  FEN -carb mod/HH diet,lasix prn. VTE- Coumadin, trend INR, SCDs ID- none  Dispo -Medically stable for discharge to SNF when bed available. Covid test negative.   LOS: 14 days    02-11-1982, [facility] 03/05/2020, 8:10 AM Please see Amion for pager number during day hours 7:00am-4:30pm

## 2020-03-05 NOTE — Progress Notes (Signed)
ANTICOAGULATION CONSULT NOTE - Follow Up Consult  Pharmacy Consult for Coumadin Indication: afib and h/o mvr  Allergies  Allergen Reactions  . Apresoline [Hydralazine] Nausea And Vomiting  . Ace Inhibitors Other (See Comments)    cough  . Benadryl [Diphenhydramine Hcl] Other (See Comments)    Makes restless legs worse  . Imdur [Isosorbide Dinitrate] Other (See Comments)    headache  . Metoprolol Other (See Comments)    Kidney failure  . Nsaids Other (See Comments)    REACTION: Currently taking Coumadin  . Valsartan Other (See Comments)    REACTION:shuts down Kidney    Patient Measurements: Height: 5\' 6"  (167.6 cm) Weight: 90.5 kg (199 lb 8.3 oz) IBW/kg (Calculated) : 63.8  Vital Signs: Temp: 97.5 F (36.4 C) (07/02 0816) Temp Source: Oral (07/02 0329) BP: 104/52 (07/02 0816) Pulse Rate: 60 (07/02 0816)  Labs: Recent Labs    03/03/20 0815 03/04/20 0908 03/05/20 0353  HGB 13.6  --   --   HCT 41.9  --   --   PLT 248  --   --   LABPROT 27.9* 27.5* 27.1*  INR 2.7* 2.7* 2.6*  CREATININE 1.51*  --   --     Estimated Creatinine Clearance: 49.3 mL/min (A) (by C-G formula based on SCr of 1.51 mg/dL (H)).  Assessment: 69 year old man on warfarin PTA for afib and mAVR who was admitted for a fall and found to have 5 mm SDH (small per NS, no surgery planned). Warfarin was reversed with vitamin K and Kcentra on admission and restarted 6/20.   Of note, his new target INR is 2-3 d/t SDH even though he has afib and mAVR. His usual outpatient provider was notified of this goal change (down from 2.5- 3.5) on 03/05/20.   INR is stable and at goal today. Patient is eating 100%.    Warfarin PTA dose: 2.5mg  Tue/Thr, 5mg  all other days (Goal 2.5- 3.5)  Goal of Therapy:  INR 2-3 Monitor platelets by anticoagulation protocol: Yes   Plan:  Warfarin 2.5mg  x1 Monitor daily INR, CBC/plt  Monitor for signs/symptoms of bleeding  If discharged today, suggest 2.5mg  daily with INR check  by Monday 7/5    , PharmD, BCPS, BCCP Clinical Pharmacist  Please check AMION for all [facility] phone numbers After 10:00 PM, call Main Pharmacy [phone]

## 2020-03-05 NOTE — Progress Notes (Signed)
Report called and provided to [facility].

## 2020-03-05 NOTE — Progress Notes (Signed)
SLP Cancellation Note  Patient Details Name: KELLEY KNOTH MRN: [id number] DOB: [birth year]   Cancelled treatment:       Reason Eval/Treat Not Completed: Patient declined, politely declining therapy because he says he is discharging to rehab today. Will continue to follow acutely if he remains inpatient.    Mahala Menghini., M.A. CCC-SLP Acute Rehabilitation Services Pager [phone] Office [phone]  03/05/2020, 11:52 AM

## 2020-03-05 NOTE — TOC Transition Note (Addendum)
Transition of Care [facility]) - CM/SW Discharge Note   Patient Details  Name: Andre Malone MRN: [id number] Date of Birth: 1951/08/03  Transition of Care [facility] CM/SW Contact:  Glennon Mac, RN Phone Number: 03/05/2020, 12:21 PM   Clinical Narrative:  Pt medically stable for discharge, and insurance authorization has been received for admission to skilled nursing facility.  Plan dc to [facility] [place] today; pt agreeable to plan.  Will arrange PTAR transport to facility; called for pickup at 2pm so that pt may have lunch.   Bedside nurse to call report to [phone]; pt going to room #110.      Final next level of care: Skilled Nursing Facility Barriers to Discharge: Barriers Resolved   Patient Goals and CMS Choice Patient states their goals for this hospitalization and ongoing recovery are:: to be able to go home CMS Medicare.gov Compare Post Acute Care list provided to:: Patient Choice offered to / list presented to : Patient, Adult Children  Discharge Placement  [facility]                     Discharge Plan and Services   Discharge Planning Services: CM Consult Post Acute Care Choice: Skilled Nursing Facility                               Social Determinants of Health (SDOH) Interventions     Readmission Risk Interventions Readmission Risk Prevention Plan 03/05/2020  Transportation Screening Complete  PCP or Specialist Appt within 5-7 Days Not Complete  Not Complete comments Pt discharging to SNF  Home Care Screening Not Complete  Home Care Screening Not Completed Comments Pt discharging to SNF  Medication Review (RN CM) Complete  Some recent data might be hidden   Quintella Baton, RN, BSN  Trauma/Neuro ICU Case Manager [phone]

## 2020-03-05 NOTE — Progress Notes (Signed)
PT Cancellation Note  Patient Details Name: Andre Malone MRN: [id number] DOB: Jan 22, 1951   Cancelled Treatment:    Reason Eval/Treat Not Completed: Patient declined, no reason specified Pt declining therapy session due to fatigue and planned discharge to SNF today.    Lillia Pauls, PT, DPT Acute Rehabilitation Services Pager [phone] Office [phone]    [name] 03/05/2020, 12:16 PM

## 2020-03-09 ENCOUNTER — Telehealth: Payer: Self-pay | Admitting: *Deleted

## 2020-03-09 NOTE — Telephone Encounter (Signed)
   Transitional Care Note Hospital Discharge to Skilled Nursing Facility 03/09/2020 Reviewed By: Josetta Huddle, LPN  [name] was admitted to [facility] on 02/20/2020 with Fall, Left rib fractures 5-8, 66mm SDH, facial laceration, DM, Chronic pain, HTN, depression, Afib, mAVR and CAD.  he was transferred from [facility] to [facility] [place] (Skilled Nursing Facility) on 03/05/2020.  he will be followed by the facility's staff physicians while there and will need to follow-up with Bennie Pierini, FNP (PCP) within two weeks of discharge from SNF for Transitional Care Management.   Plan . Clinical staff will monitor discharge reports and contact patient to initiate TCM within 2 business days of SNF discharge.  . After discharge CCM team will collaborate with PCP regarding potential CCM eligibility and the benefits CCM services provide the patient. The primary goal would be to increase patient self-management of chronic medical conditions resulting in improved patient health outcomes, decreased exacerbations of chronic medical conditions, and decreased ED visits/hospital admissions.

## 2020-04-02 ENCOUNTER — Other Ambulatory Visit: Payer: Self-pay | Admitting: Nurse Practitioner

## 2020-04-02 DIAGNOSIS — E039 Hypothyroidism, unspecified: Secondary | ICD-10-CM

## 2020-04-05 DIAGNOSIS — I4891 Unspecified atrial fibrillation: Secondary | ICD-10-CM | POA: Diagnosis not present

## 2020-04-05 DIAGNOSIS — E119 Type 2 diabetes mellitus without complications: Secondary | ICD-10-CM | POA: Diagnosis not present

## 2020-04-05 DIAGNOSIS — D6869 Other thrombophilia: Secondary | ICD-10-CM | POA: Diagnosis not present

## 2020-04-05 DIAGNOSIS — E669 Obesity, unspecified: Secondary | ICD-10-CM | POA: Diagnosis not present

## 2020-04-05 DIAGNOSIS — M1990 Unspecified osteoarthritis, unspecified site: Secondary | ICD-10-CM | POA: Diagnosis not present

## 2020-04-05 DIAGNOSIS — Z6831 Body mass index (BMI) 31.0-31.9, adult: Secondary | ICD-10-CM | POA: Diagnosis not present

## 2020-04-05 DIAGNOSIS — R69 Illness, unspecified: Secondary | ICD-10-CM | POA: Diagnosis not present

## 2020-04-05 DIAGNOSIS — G8929 Other chronic pain: Secondary | ICD-10-CM | POA: Diagnosis not present

## 2020-04-05 DIAGNOSIS — E039 Hypothyroidism, unspecified: Secondary | ICD-10-CM | POA: Diagnosis not present

## 2020-04-07 DIAGNOSIS — M5412 Radiculopathy, cervical region: Secondary | ICD-10-CM | POA: Diagnosis not present

## 2020-04-07 DIAGNOSIS — M545 Low back pain: Secondary | ICD-10-CM | POA: Diagnosis not present

## 2020-04-07 DIAGNOSIS — M25519 Pain in unspecified shoulder: Secondary | ICD-10-CM | POA: Diagnosis not present

## 2020-04-07 DIAGNOSIS — E1342 Other specified diabetes mellitus with diabetic polyneuropathy: Secondary | ICD-10-CM | POA: Diagnosis not present

## 2020-04-07 DIAGNOSIS — R69 Illness, unspecified: Secondary | ICD-10-CM | POA: Diagnosis not present

## 2020-04-07 DIAGNOSIS — G2581 Restless legs syndrome: Secondary | ICD-10-CM | POA: Diagnosis not present

## 2020-04-07 DIAGNOSIS — Z79891 Long term (current) use of opiate analgesic: Secondary | ICD-10-CM | POA: Diagnosis not present

## 2020-05-05 DIAGNOSIS — Z79891 Long term (current) use of opiate analgesic: Secondary | ICD-10-CM | POA: Diagnosis not present

## 2020-05-05 DIAGNOSIS — R69 Illness, unspecified: Secondary | ICD-10-CM | POA: Diagnosis not present

## 2020-05-05 DIAGNOSIS — M5412 Radiculopathy, cervical region: Secondary | ICD-10-CM | POA: Diagnosis not present

## 2020-05-05 DIAGNOSIS — M25519 Pain in unspecified shoulder: Secondary | ICD-10-CM | POA: Diagnosis not present

## 2020-05-05 DIAGNOSIS — G2581 Restless legs syndrome: Secondary | ICD-10-CM | POA: Diagnosis not present

## 2020-05-05 DIAGNOSIS — M545 Low back pain: Secondary | ICD-10-CM | POA: Diagnosis not present

## 2020-05-05 DIAGNOSIS — E1342 Other specified diabetes mellitus with diabetic polyneuropathy: Secondary | ICD-10-CM | POA: Diagnosis not present

## 2020-07-06 ENCOUNTER — Other Ambulatory Visit: Payer: Self-pay | Admitting: Nurse Practitioner

## 2020-07-06 DIAGNOSIS — E1140 Type 2 diabetes mellitus with diabetic neuropathy, unspecified: Secondary | ICD-10-CM

## 2020-07-06 DIAGNOSIS — E039 Hypothyroidism, unspecified: Secondary | ICD-10-CM

## 2020-07-23 ENCOUNTER — Ambulatory Visit (INDEPENDENT_AMBULATORY_CARE_PROVIDER_SITE_OTHER): Payer: Medicare HMO | Admitting: Nurse Practitioner

## 2020-07-23 ENCOUNTER — Encounter: Payer: Self-pay | Admitting: Nurse Practitioner

## 2020-07-23 ENCOUNTER — Other Ambulatory Visit: Payer: Self-pay

## 2020-07-23 VITALS — BP 148/75 | HR 60 | Temp 98.2°F | Resp 20 | Ht 66.0 in | Wt 200.0 lb

## 2020-07-23 DIAGNOSIS — I480 Paroxysmal atrial fibrillation: Secondary | ICD-10-CM

## 2020-07-23 DIAGNOSIS — Z794 Long term (current) use of insulin: Secondary | ICD-10-CM

## 2020-07-23 DIAGNOSIS — I712 Thoracic aortic aneurysm, without rupture, unspecified: Secondary | ICD-10-CM

## 2020-07-23 DIAGNOSIS — N1831 Chronic kidney disease, stage 3a: Secondary | ICD-10-CM | POA: Diagnosis not present

## 2020-07-23 DIAGNOSIS — F3342 Major depressive disorder, recurrent, in full remission: Secondary | ICD-10-CM

## 2020-07-23 DIAGNOSIS — E663 Overweight: Secondary | ICD-10-CM

## 2020-07-23 DIAGNOSIS — I5023 Acute on chronic systolic (congestive) heart failure: Secondary | ICD-10-CM

## 2020-07-23 DIAGNOSIS — I152 Hypertension secondary to endocrine disorders: Secondary | ICD-10-CM

## 2020-07-23 DIAGNOSIS — F5101 Primary insomnia: Secondary | ICD-10-CM

## 2020-07-23 DIAGNOSIS — E1159 Type 2 diabetes mellitus with other circulatory complications: Secondary | ICD-10-CM

## 2020-07-23 DIAGNOSIS — E1140 Type 2 diabetes mellitus with diabetic neuropathy, unspecified: Secondary | ICD-10-CM

## 2020-07-23 DIAGNOSIS — E039 Hypothyroidism, unspecified: Secondary | ICD-10-CM

## 2020-07-23 DIAGNOSIS — E559 Vitamin D deficiency, unspecified: Secondary | ICD-10-CM

## 2020-07-23 DIAGNOSIS — R69 Illness, unspecified: Secondary | ICD-10-CM | POA: Diagnosis not present

## 2020-07-23 DIAGNOSIS — Z952 Presence of prosthetic heart valve: Secondary | ICD-10-CM

## 2020-07-23 DIAGNOSIS — G2581 Restless legs syndrome: Secondary | ICD-10-CM

## 2020-07-23 DIAGNOSIS — I25119 Atherosclerotic heart disease of native coronary artery with unspecified angina pectoris: Secondary | ICD-10-CM | POA: Diagnosis not present

## 2020-07-23 LAB — BAYER DCA HB A1C WAIVED: HB A1C (BAYER DCA - WAIVED): 6.1 % (ref <7.0)

## 2020-07-23 LAB — COAGUCHEK XS/INR WAIVED
INR: 1.8 — ABNORMAL HIGH (ref 0.9–1.1)
Prothrombin Time: 22.1 s

## 2020-07-23 MED ORDER — WARFARIN SODIUM 5 MG PO TABS: 5.0000 mg | ORAL_TABLET | Freq: Every day | ORAL | 3 refills | Status: DC

## 2020-07-23 MED ORDER — LEVOTHYROXINE SODIUM 100 MCG PO TABS: 100.0000 ug | ORAL_TABLET | Freq: Every day | ORAL | 0 refills | Status: DC

## 2020-07-23 MED ORDER — SERTRALINE HCL 100 MG PO TABS: 100.0000 mg | ORAL_TABLET | Freq: Every day | ORAL | 1 refills | Status: DC

## 2020-07-23 MED ORDER — METFORMIN HCL 1000 MG PO TABS: 1000.0000 mg | ORAL_TABLET | Freq: Two times a day (BID) | ORAL | 1 refills | Status: DC

## 2020-07-23 MED ORDER — PRAVASTATIN SODIUM 40 MG PO TABS: 40.0000 mg | ORAL_TABLET | Freq: Every day | ORAL | 1 refills | Status: DC

## 2020-07-23 MED ORDER — AMLODIPINE BESYLATE 5 MG PO TABS: 2.5000 mg | ORAL_TABLET | Freq: Every day | ORAL | 3 refills | Status: DC

## 2020-07-23 MED ORDER — FUROSEMIDE 40 MG PO TABS: 40.0000 mg | ORAL_TABLET | Freq: Every day | ORAL | 1 refills | Status: DC | PRN

## 2020-07-23 MED ORDER — TRAZODONE HCL 100 MG PO TABS: ORAL_TABLET | ORAL | 0 refills | Status: DC

## 2020-07-23 NOTE — Patient Instructions (Signed)

## 2020-07-23 NOTE — Progress Notes (Signed)
Subjective:    Patient ID: Andre Malone, male    DOB: 10-09-1950, 69 y.o.   MRN: [id number]   Chief Complaint: medical management of chronic issues     HPI:  1. Acute on chronic systolic heart failure [facility]) Last saw cardiology in may 2021 but was just for pacemaker transmission. He [place] still taking pacerone.  Was seen in person by cardiology in feb 2021. No changes made to plan of cre.  2. Atherosclerosis of native coronary artery of native heart with angina pectoris [facility]) See above note about cardiology  3. Hypertension associated with diabetes ([place]) Does not check blood pressure at home. BP Readings from Last 3 Encounters:  03/05/20 107/63  12/24/19 127/62  09/16/19 (!) 142/69     4. Paroxysmal atrial fibrillation (HCC) He is in atrial fib and was on coumadin. He has not had INR since June he denies nay bleeding.   5. Thoracic aortic aneurysm without rupture ([place]) Had chest CT in June 2021 and nothing noted on aorta.  6. Type 2 diabetes mellitus with diabetic neuropathy, with long-term current use of insulin ([place]) Does not check blood sugars at  Home daily. He does try to wtahc diet. Lab Results  Component Value Date   HGBA1C 6.4 06/04/2019     7. Hypothyroidism, unspecified type No problems that he is aware of. Lab Results  Component Value Date   TSH 6.240 (H) 12/18/2017     8. Stage 3a chronic kidney disease ([place]) Labs pending Lab Results  Component Value Date   CREATININE 1.51 (H) 03/03/2020     9. Overweight No recent weight changes Wt Readings from Last 3 Encounters:  07/23/20 200 lb (90.7 kg)  03/03/20 199 lb 8.3 oz (90.5 kg)  12/22/19 206 lb (93.4 kg)   BMI Readings from Last 3 Encounters:  07/23/20 32.28 kg/m  03/03/20 32.20 kg/m  12/22/19 33.25 kg/m     10. Primary insomnia Takes trazadone to sleep at night  11. Recurrent major depressive disorder, in full remission [facility]) His wife died last year and he has not been doing  very well. He was put on zoloft after she dies because he was so anxious. He has been out of his zoloft for a few months. Depression screen [facility] 2/9 07/23/2020 09/26/2019 09/16/2019  Decreased Interest 1 1 0  Down, Depressed, Hopeless 3 1 0  PHQ - 2 Score 4 2 0  Altered sleeping 1 1 -  Tired, decreased energy 3 1 -  Change in appetite 2 2 -  Feeling bad or failure about yourself  1 0 -  Trouble concentrating 0 0 -  Moving slowly or fidgety/restless 0 1 -  Suicidal thoughts 0 0 -  PHQ-9 Score 11 7 -  Difficult doing work/chores Not difficult at all Somewhat difficult -  Some recent data might be hidden    12. RLS (restless legs syndrome) On no meds currently for this.  13. Vitamin D deficiency Takes vitamin d supplements when he remembers.    Outpatient Encounter Medications as of 07/23/2020  Medication Sig  . acetaminophen (TYLENOL) 500 MG tablet Take 2 tablets (1,000 mg total) by mouth every 6 (six) hours.  Marland Kitchen amiodarone (PACERONE) 200 MG tablet TAKE 1 TABLET BY MOUTH 2 TIMES DAILY. PLEASE SCHEDULE APPT FOR FUTURE REFILLS. 1ST ATTEMPT (Patient taking differently: Take 200 mg by mouth 2 (two) times daily. )  . amLODipine (NORVASC) 5 MG tablet Take 0.5 tablets (2.5 mg total) by mouth daily.  Marland Kitchen  aspirin EC 81 MG EC tablet Take 1 tablet (81 mg total) by mouth daily.  Marland Kitchen docusate sodium (COLACE) 100 MG capsule Take 1 capsule (100 mg total) by mouth 2 (two) times daily.  . furosemide (LASIX) 40 MG tablet Take 1 tablet (40 mg total) by mouth daily as needed for edema.  . insulin aspart (NOVOLOG) 100 UNIT/ML injection Inject 0-15 Units into the skin 3 (three) times daily with meals.  . lactase (LACTASE ENZYME) 3000 units tablet Take 9,000 Units by mouth daily as needed (when consuming dairy products).  Marland Kitchen levothyroxine (SYNTHROID) 100 MCG tablet Take 1 tablet (100 mcg total) by mouth daily before breakfast.  . lidocaine (LIDODERM) 5 % Place 1 patch onto the skin daily. Remove & Discard patch  within 12 hours or as directed by MD  . metFORMIN (GLUCOPHAGE) 1000 MG tablet TAKE 1 TABLET (1,000 MG TOTAL) BY MOUTH 2 (TWO) TIMES DAILY WITH A MEAL.  . Multiple Vitamin (MULTIVITAMIN WITH MINERALS) TABS tablet Take 1 tablet by mouth daily.  . Oxycodone HCl 10 MG TABS Take 1-2 tablets (10-20 mg total) by mouth every 4 (four) hours as needed.  . polyethylene glycol (MIRALAX / GLYCOLAX) 17 g packet Take 17 g by mouth daily as needed for mild constipation.  . pravastatin (PRAVACHOL) 40 MG tablet Take 1 tablet (40 mg total) by mouth daily.  . sertraline (ZOLOFT) 100 MG tablet Take 1 tablet (100 mg total) by mouth daily.  . traZODone (DESYREL) 100 MG tablet TAKE 1 TABLET BY MOUTH EVERYDAY AT BEDTIME (Patient taking differently: Take 100 mg by mouth at bedtime as needed for sleep. )  . warfarin (COUMADIN) 2.5 MG tablet Take 1 tablet (2.5 mg total) by mouth daily. Please check INR by Monday 03/08/20   No facility-administered encounter medications on file as of 07/23/2020.    Past Surgical History:  Procedure Laterality Date  . AORTIC ROOT REPLACEMENT    . AORTIC VALVE REPLACEMENT    . CARDIAC CATHETERIZATION    . CARDIAC VALVE REPLACEMENT    . LEAD REVISION N/A 09/17/2013   Procedure: LEAD REVISION;  Surgeon: Deboraha Sprang, MD;  Location: [facility] CATH LAB;  Service: Cardiovascular;  Laterality: N/A;  . LEFT HEART CATHETERIZATION WITH CORONARY ANGIOGRAM N/A 05/30/2013   Procedure: LEFT HEART CATHETERIZATION WITH CORONARY ANGIOGRAM;  Surgeon: Peter M Martinique, MD;  Location: [facility] CATH LAB;  Service: Cardiovascular;  Laterality: N/A;  . PACEMAKER INSERTION  05/17/2009; 09/17/2013   STJ dual chamber pacemaker implanted for CHB; RV lead revision and generator change 09/17/2013 by Dr Caryl Comes for ventricular lead malfunction  . Right elbow surgery    . Subxiphoid window    . TONSILLECTOMY    . TOTAL HIP ARTHROPLASTY  02/27/2012   Procedure: TOTAL HIP ARTHROPLASTY - left  Surgeon: Garald Balding, MD;  Location: [place];  Service: Orthopedics;  Laterality: Left;    Family History  Problem Relation Age of Onset  . Heart disease Father        Deceased  . Alcohol abuse Father   . Other Mother        Deceased at young age  . Stroke Mother        after delivery  . Heart attack Brother   . Other Brother        MVA  . Heart disease Paternal Grandfather   . Healthy Son   . Healthy Daughter     New complaints: Patient fell and hit his head and developed  a subdural bleed. He spent some time in chronic care living facility.  Social history: Lost his house once his wife died. He now lives in [place]- which is subsidized housing for people over 65.  Controlled substance contract: n/a    Review of Systems  Constitutional: Negative for diaphoresis.  Eyes: Negative for pain.  Respiratory: Negative for shortness of breath.   Cardiovascular: Negative for chest pain, palpitations and leg swelling.  Gastrointestinal: Negative for abdominal pain.  Endocrine: Negative for polydipsia.  Skin: Negative for rash.  Neurological: Negative for dizziness, weakness and headaches.  Hematological: Does not bruise/bleed easily.  All other systems reviewed and are negative.      Objective:   Physical Exam Vitals and nursing note reviewed.  Constitutional:      Appearance: Normal appearance. He is well-developed.  HENT:     Head: Normocephalic.     Nose: Nose normal.  Eyes:     Pupils: Pupils are equal, round, and reactive to light.  Neck:     Thyroid: No thyroid mass or thyromegaly.     Vascular: No carotid bruit or JVD.     Trachea: Phonation normal.  Cardiovascular:     Rate and Rhythm: Normal rate and regular rhythm.  Pulmonary:     Effort: Pulmonary effort is normal. No respiratory distress.     Breath sounds: Normal breath sounds.  Abdominal:     General: Bowel sounds are normal.     Palpations: Abdomen is soft.     Tenderness: There is no abdominal tenderness.    Musculoskeletal:        General: Normal range of motion.     Cervical back: Normal range of motion and neck supple.  Lymphadenopathy:     Cervical: No cervical adenopathy.  Skin:    General: Skin is warm and dry.  Neurological:     Mental Status: He is alert and oriented to person, place, and time.  Psychiatric:        Behavior: Behavior normal.        Thought Content: Thought content normal.        Judgment: Judgment normal.     BP (!) 148/75   Pulse 60   Temp 98.2 F (36.8 C) (Temporal)   Resp 20   Ht _0  (1.676 m)   Wt 200 lb (90.7 kg)   SpO2 97%   BMI 32.28 kg/m   hgba1c 6.1%     Assessment & Plan:  Andre Malone comes in today with chief complaint of Medical Management of Chronic Issues   Diagnosis and orders addressed:  1. Acute on chronic systolic heart failure ([place])  2. Atherosclerosis of native coronary artery of native heart with angina pectoris ([place]) Follow up with cardiology in th enext 3  months - pravastatin (PRAVACHOL) 40 MG tablet; Take 1 tablet (40 mg total) by mouth daily.  Dispense: 90 tablet; Refill: 1  3. Hypertension associated with diabetes ([place]) Low saodium diet - CBC with Differential/Platelet - CMP14+EGFR - furosemide (LASIX) 40 MG tablet; Take 1 tablet (40 mg total) by mouth daily as needed for edema.  Dispense: 90 tablet; Refill: 1 - amLODipine (NORVASC) 5 MG tablet; Take 0.5 tablets (2.5 mg total) by mouth daily.  Dispense: 45 tablet; Refill: 3  4. Paroxysmal atrial fibrillation (HCC) Increased coumadin 49m daily rechekc in 3 weeks - CoaguChek XS/INR Waived - warfarin (COUMADIN) 5 MG tablet; Take 1 tablet (5 mg total) by mouth daily.  Dispense:  30 tablet; Refill: 3  5. Thoracic aortic aneurysm without rupture ([place])  6. Type 2 diabetes mellitus with diabetic neuropathy, with long-term current use of insulin (HCC) Continue to watch carbs in diet - Bayer DCA Hb A1c Waived - Lipid panel - Microalbumin / creatinine urine  ratio - metFORMIN (GLUCOPHAGE) 1000 MG tablet; Take 1 tablet (1,000 mg total) by mouth 2 (two) times daily with a meal.  Dispense: 180 tablet; Refill: 1  7. Hypothyroidism, unspecified type Labs pedning - levothyroxine (SYNTHROID) 100 MCG tablet; Take 1 tablet (100 mcg total) by mouth daily before breakfast.  Dispense: 90 tablet; Refill: 0  8. Stage 3a chronic kidney disease ([place]) Labs pending  9. Overweight Discussed diet and exercise for person with BMI >25 Will recheck weight in 3-6 months  10. Primary insomnia Bedtime routine - traZODone (DESYREL) 100 MG tablet; TAKE 1 TABLET BY MOUTH EVERYDAY AT BEDTIME  Dispense: 90 tablet; Refill: 0  11. Recurrent major depressive disorder, in full remission (HCC) backon zoloft 179m daily - sertraline (ZOLOFT) 100 MG tablet; Take 1 tablet (100 mg total) by mouth daily.  Dispense: 90 tablet; Refill: 1  12. RLS (restless legs syndrome) Keep legs warm at night  13. Vitamin D deficiency Daily vitamin d supplement  14. S/P AVR (aortic valve replacement)   Labs pending Health Maintenance reviewed Diet and exercise encouraged  Follow up plan: 6 months follow up 3 weeks inr   [name] MHassell Done FNP

## 2020-07-24 LAB — CBC WITH DIFFERENTIAL/PLATELET
Basophils Absolute: 0.1 10*3/uL (ref 0.0–0.2)
Basos: 1 %
EOS (ABSOLUTE): 0.4 10*3/uL (ref 0.0–0.4)
Eos: 5 %
Hematocrit: 41.5 % (ref 37.5–51.0)
Hemoglobin: 13.7 g/dL (ref 13.0–17.7)
Immature Grans (Abs): 0.1 10*3/uL (ref 0.0–0.1)
Immature Granulocytes: 1 %
Lymphocytes Absolute: 1 10*3/uL (ref 0.7–3.1)
Lymphs: 14 %
MCH: 29.6 pg (ref 26.6–33.0)
MCHC: 33 g/dL (ref 31.5–35.7)
MCV: 90 fL (ref 79–97)
Monocytes Absolute: 0.7 10*3/uL (ref 0.1–0.9)
Monocytes: 10 %
Neutrophils Absolute: 5.1 10*3/uL (ref 1.4–7.0)
Neutrophils: 69 %
Platelets: 195 10*3/uL (ref 150–450)
RBC: 4.63 x10E6/uL (ref 4.14–5.80)
RDW: 14.3 % (ref 11.6–15.4)
WBC: 7.4 10*3/uL (ref 3.4–10.8)

## 2020-07-24 LAB — CMP14+EGFR
ALT: 13 IU/L (ref 0–44)
AST: 18 IU/L (ref 0–40)
Albumin/Globulin Ratio: 1.3 (ref 1.2–2.2)
Albumin: 4.3 g/dL (ref 3.8–4.8)
Alkaline Phosphatase: 88 IU/L (ref 44–121)
BUN/Creatinine Ratio: 14 (ref 10–24)
BUN: 19 mg/dL (ref 8–27)
Bilirubin Total: 0.3 mg/dL (ref 0.0–1.2)
CO2: 24 mmol/L (ref 20–29)
Calcium: 9.4 mg/dL (ref 8.6–10.2)
Chloride: 105 mmol/L (ref 96–106)
Creatinine, Ser: 1.36 mg/dL — ABNORMAL HIGH (ref 0.76–1.27)
GFR calc Af Amer: 61 mL/min/{1.73_m2} (ref >59)
GFR calc non Af Amer: 53 mL/min/{1.73_m2} — ABNORMAL LOW (ref >59)
Globulin, Total: 3.4 g/dL (ref 1.5–4.5)
Glucose: 100 mg/dL — ABNORMAL HIGH (ref 65–99)
Potassium: 4.6 mmol/L (ref 3.5–5.2)
Sodium: 140 mmol/L (ref 134–144)
Total Protein: 7.7 g/dL (ref 6.0–8.5)

## 2020-07-24 LAB — LIPID PANEL
Chol/HDL Ratio: 7.2 ratio — ABNORMAL HIGH (ref 0.0–5.0)
Cholesterol, Total: 273 mg/dL — ABNORMAL HIGH (ref 100–199)
HDL: 38 mg/dL — ABNORMAL LOW (ref >39)
LDL Chol Calc (NIH): 193 mg/dL — ABNORMAL HIGH (ref 0–99)
Triglycerides: 219 mg/dL — ABNORMAL HIGH (ref 0–149)
VLDL Cholesterol Cal: 42 mg/dL — ABNORMAL HIGH (ref 5–40)

## 2020-07-27 DIAGNOSIS — M545 Low back pain, unspecified: Secondary | ICD-10-CM | POA: Diagnosis not present

## 2020-07-27 DIAGNOSIS — G2581 Restless legs syndrome: Secondary | ICD-10-CM | POA: Diagnosis not present

## 2020-07-27 DIAGNOSIS — M25519 Pain in unspecified shoulder: Secondary | ICD-10-CM | POA: Diagnosis not present

## 2020-07-27 DIAGNOSIS — E1342 Other specified diabetes mellitus with diabetic polyneuropathy: Secondary | ICD-10-CM | POA: Diagnosis not present

## 2020-07-27 DIAGNOSIS — Z79891 Long term (current) use of opiate analgesic: Secondary | ICD-10-CM | POA: Diagnosis not present

## 2020-07-27 DIAGNOSIS — R69 Illness, unspecified: Secondary | ICD-10-CM | POA: Diagnosis not present

## 2020-07-27 DIAGNOSIS — M5412 Radiculopathy, cervical region: Secondary | ICD-10-CM | POA: Diagnosis not present

## 2020-08-10 ENCOUNTER — Telehealth: Payer: Self-pay

## 2020-08-10 NOTE — Telephone Encounter (Signed)
Patient states he lives in [place] and is having a hard time getting to our office to his INR appointment.  Wants to know if MMM knows of a place in [place] he can go to to have his INR checked?

## 2020-08-12 ENCOUNTER — Ambulatory Visit: Payer: Self-pay | Admitting: Nurse Practitioner

## 2020-08-12 NOTE — Telephone Encounter (Signed)
He can probably go to the coumadin clinic- please find out for me.

## 2020-08-12 NOTE — Telephone Encounter (Signed)
Lmtcb- does patient have a cardiologist ? If so he can check it. IF NOT will call coumadinclinic to see what needs to be done at- [phone].

## 2020-08-17 NOTE — Telephone Encounter (Signed)
Lmtcb  No call back - this encounter will be closed  

## 2020-10-01 ENCOUNTER — Other Ambulatory Visit: Payer: Self-pay | Admitting: Nurse Practitioner

## 2020-10-01 DIAGNOSIS — E039 Hypothyroidism, unspecified: Secondary | ICD-10-CM

## 2020-10-16 ENCOUNTER — Other Ambulatory Visit: Payer: Self-pay | Admitting: Nurse Practitioner

## 2020-10-16 DIAGNOSIS — I480 Paroxysmal atrial fibrillation: Secondary | ICD-10-CM

## 2020-10-17 ENCOUNTER — Emergency Department (HOSPITAL_COMMUNITY)
Admission: EM | Admit: 2020-10-17 | Discharge: 2020-10-17 | Disposition: A | Discharge status: Home / Self Care | Payer: Medicare HMO | Attending: Emergency Medicine | Admitting: Emergency Medicine

## 2020-10-17 ENCOUNTER — Encounter (HOSPITAL_COMMUNITY): Payer: Self-pay | Admitting: *Deleted

## 2020-10-17 ENCOUNTER — Other Ambulatory Visit: Payer: Self-pay

## 2020-10-17 DIAGNOSIS — Z950 Presence of cardiac pacemaker: Secondary | ICD-10-CM | POA: Insufficient documentation

## 2020-10-17 DIAGNOSIS — I130 Hypertensive heart and chronic kidney disease with heart failure and stage 1 through stage 4 chronic kidney disease, or unspecified chronic kidney disease: Secondary | ICD-10-CM | POA: Insufficient documentation

## 2020-10-17 DIAGNOSIS — I5023 Acute on chronic systolic (congestive) heart failure: Secondary | ICD-10-CM | POA: Diagnosis not present

## 2020-10-17 DIAGNOSIS — I480 Paroxysmal atrial fibrillation: Secondary | ICD-10-CM | POA: Diagnosis not present

## 2020-10-17 DIAGNOSIS — Z7982 Long term (current) use of aspirin: Secondary | ICD-10-CM | POA: Insufficient documentation

## 2020-10-17 DIAGNOSIS — E1140 Type 2 diabetes mellitus with diabetic neuropathy, unspecified: Secondary | ICD-10-CM | POA: Insufficient documentation

## 2020-10-17 DIAGNOSIS — Z79899 Other long term (current) drug therapy: Secondary | ICD-10-CM | POA: Diagnosis not present

## 2020-10-17 DIAGNOSIS — E039 Hypothyroidism, unspecified: Secondary | ICD-10-CM | POA: Insufficient documentation

## 2020-10-17 DIAGNOSIS — I2510 Atherosclerotic heart disease of native coronary artery without angina pectoris: Secondary | ICD-10-CM | POA: Insufficient documentation

## 2020-10-17 DIAGNOSIS — Z7901 Long term (current) use of anticoagulants: Secondary | ICD-10-CM | POA: Insufficient documentation

## 2020-10-17 DIAGNOSIS — H1131 Conjunctival hemorrhage, right eye: Secondary | ICD-10-CM | POA: Diagnosis not present

## 2020-10-17 DIAGNOSIS — N189 Chronic kidney disease, unspecified: Secondary | ICD-10-CM | POA: Diagnosis not present

## 2020-10-17 DIAGNOSIS — Z96641 Presence of right artificial hip joint: Secondary | ICD-10-CM | POA: Diagnosis not present

## 2020-10-17 DIAGNOSIS — H5789 Other specified disorders of eye and adnexa: Secondary | ICD-10-CM | POA: Diagnosis not present

## 2020-10-17 DIAGNOSIS — Z7984 Long term (current) use of oral hypoglycemic drugs: Secondary | ICD-10-CM | POA: Insufficient documentation

## 2020-10-17 MED ORDER — FLUORESCEIN SODIUM 1 MG OP STRP
1.0000 | ORAL_STRIP | Freq: Once | OPHTHALMIC | Status: DC
  Filled 2020-10-17: qty 1

## 2020-10-17 MED ORDER — TETRACAINE HCL 0.5 % OP SOLN
2.0000 [drp] | Freq: Once | OPHTHALMIC | Status: DC
  Filled 2020-10-17: qty 4
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
## 2020-10-17 NOTE — Discharge Instructions (Signed)
Please continue to take your medications exactly as prescribed, especially your warfarin and your amiodarone and your amlodipine.  If you should develop worsening symptoms return to the emergency department, read the attached instructions, be aware that this condition is usually benign and goes away by itself over 1 or 2 weeks

## 2020-10-25 ENCOUNTER — Other Ambulatory Visit: Payer: Self-pay | Admitting: Nurse Practitioner

## 2020-10-25 DIAGNOSIS — F5101 Primary insomnia: Secondary | ICD-10-CM

## 2020-10-27 DIAGNOSIS — G2581 Restless legs syndrome: Secondary | ICD-10-CM | POA: Diagnosis not present

## 2020-10-27 DIAGNOSIS — M5412 Radiculopathy, cervical region: Secondary | ICD-10-CM | POA: Diagnosis not present

## 2020-10-27 DIAGNOSIS — E1342 Other specified diabetes mellitus with diabetic polyneuropathy: Secondary | ICD-10-CM | POA: Diagnosis not present

## 2020-10-27 DIAGNOSIS — M25519 Pain in unspecified shoulder: Secondary | ICD-10-CM | POA: Diagnosis not present

## 2020-10-27 DIAGNOSIS — Z79891 Long term (current) use of opiate analgesic: Secondary | ICD-10-CM | POA: Diagnosis not present

## 2020-10-27 DIAGNOSIS — R69 Illness, unspecified: Secondary | ICD-10-CM | POA: Diagnosis not present

## 2020-10-27 DIAGNOSIS — M545 Low back pain, unspecified: Secondary | ICD-10-CM | POA: Diagnosis not present

## 2020-11-20 ENCOUNTER — Other Ambulatory Visit: Payer: Self-pay | Admitting: Nurse Practitioner

## 2020-11-20 DIAGNOSIS — I480 Paroxysmal atrial fibrillation: Secondary | ICD-10-CM

## 2020-11-22 NOTE — Telephone Encounter (Signed)
MMM NTBS 30 days given 10/16/20 INR last done in November

## 2020-12-02 ENCOUNTER — Ambulatory Visit (INDEPENDENT_AMBULATORY_CARE_PROVIDER_SITE_OTHER): Payer: Medicare HMO | Admitting: Nurse Practitioner

## 2020-12-02 ENCOUNTER — Other Ambulatory Visit: Payer: Self-pay

## 2020-12-02 ENCOUNTER — Encounter: Payer: Self-pay | Admitting: Nurse Practitioner

## 2020-12-02 VITALS — BP 130/61 | HR 62 | Temp 98.2°F | Ht 66.5 in | Wt 232.6 lb

## 2020-12-02 DIAGNOSIS — Z952 Presence of prosthetic heart valve: Secondary | ICD-10-CM

## 2020-12-02 DIAGNOSIS — I480 Paroxysmal atrial fibrillation: Secondary | ICD-10-CM | POA: Diagnosis not present

## 2020-12-02 DIAGNOSIS — K047 Periapical abscess without sinus: Secondary | ICD-10-CM | POA: Diagnosis not present

## 2020-12-02 LAB — COAGUCHEK XS/INR WAIVED
INR: 1.6 — ABNORMAL HIGH (ref 0.9–1.1)
Prothrombin Time: 19.8 s

## 2020-12-02 MED ORDER — CLINDAMYCIN HCL 300 MG PO CAPS: 300.0000 mg | ORAL_CAPSULE | Freq: Four times a day (QID) | ORAL | 0 refills | Status: DC

## 2020-12-02 MED ORDER — PENICILLIN G BENZATHINE 1200000 UNIT/2ML IM SUSP
1.2000 10*6.[IU] | Freq: Once | INTRAMUSCULAR | Status: AC
  Administered 2020-12-02: 1.2 10*6.[IU] via INTRAMUSCULAR

## 2020-12-02 NOTE — Progress Notes (Signed)
Subjective:    Patient ID: Andre Malone, male    DOB: 09-Jan-1951, 70 y.o.   MRN: [id number]   Chief Complaint: Dental Pain   HPI Having pain in his left lower jaw. 9/10 pain. Does not see a dentist regularly. He plans on having his teeth pulled and dentures fit. Pain started about 3 days ago. He describes it as throbbing and keeping him up at night. Has been taking oxycodone for chronic pain which has not helped the toothache. He is on coumadin so he does not want to take an nsaid for pain. He has aortic valve replacement 2015.    Review of Systems  HENT: Positive for dental problem. Negative for ear discharge and ear pain.   Respiratory: Negative for cough, chest tightness, shortness of breath and wheezing.   Cardiovascular: Negative for chest pain and palpitations.  Gastrointestinal: Negative for abdominal pain, constipation and diarrhea.  Genitourinary: Negative for difficulty urinating.  Neurological: Negative for dizziness, syncope and light-headedness.       Objective:   Physical Exam HENT:     Head: Normocephalic.     Mouth/Throat:     Mouth: Mucous membranes are moist.     Dentition: Abnormal dentition. Dental tenderness and dental abscesses present.     Comments: Left lower gum (+) for absces.  Eyes:     Pupils: Pupils are equal, round, and reactive to light.  Cardiovascular:     Rate and Rhythm: Normal rate and regular rhythm.     Pulses: Normal pulses.     Heart sounds: Normal heart sounds.  Pulmonary:     Effort: Pulmonary effort is normal.     Breath sounds: Normal breath sounds.  Abdominal:     General: Bowel sounds are normal.  Musculoskeletal:        General: Normal range of motion.     Cervical back: Normal range of motion and neck supple.  Skin:    General: Skin is warm and dry.     Capillary Refill: Capillary refill takes less than 2 seconds.  Neurological:     Mental Status: He is oriented to person, place, and time.  Psychiatric:         Mood and Affect: Mood normal.        Behavior: Behavior normal.     Vitals:   12/02/20 1006 12/02/20 1010  BP: (!) 167/84 130/61  Pulse: 61 62  Temp: 98.2 F (36.8 C)        Assessment & Plan:  [name] in today with chief complaint of Dental Pain   1. Dental abscess Needs to see cardology befor ehe has dnetal work. - penicillin g benzathine (BICILLIN LA) 1200000 UNIT/2ML injection 1.2 Million Units - clindamycin (CLEOCIN) 300 MG capsule; Take 1 capsule (300 mg total) by mouth 4 (four) times daily.  Dispense: 40 capsule; Refill: 0  2. Paroxysmal atrial fibrillation (HCC) going to set him up with coumadin clinic in [place] because ehe cannot get here monthly. - CoaguChek XS/INR Waived  3. S/P AVR (aortic valve replacement) Needs to follow u with cardiology    The above assessment and management plan was discussed with the patient. The patient verbalized understanding of and has agreed to the management plan. Patient is aware to call the clinic if symptoms persist or worsen. Patient is aware when to return to the clinic for a follow-up visit. Patient educated on when it is appropriate to go to the emergency department.   [name] Daphine Deutscher,  FNP

## 2020-12-02 NOTE — Patient Instructions (Signed)
Dental Abscess  A dental abscess is an area of pus in or around a tooth. It comes from an infection. It can cause pain and other symptoms. Treatment will help with symptoms and prevent the infection from spreading. Follow these instructions at home: Medicines  Take over-the-counter and prescription medicines only as told by your dentist.  If you were prescribed an antibiotic medicine, take it as told by your dentist. Do not stop taking it even if you start to feel better.  If you were prescribed a gel that has numbing medicine in it, use it exactly as told.  Do not drive or use heavy machinery (like a lawn mower) while taking prescription pain medicine. General instructions  Rinse out your mouth often with salt water. ? To make salt water, dissolve -1 tsp of salt in 1 cup of warm water.  Eat a soft diet while your mouth is healing.  Drink enough fluid to keep your urine pale yellow.  Do not apply heat to the outside of your mouth.  Do not use any products that contain nicotine or tobacco. These include cigarettes and e-cigarettes. If you need help quitting, ask your doctor.  Keep all follow-up visits as told by your dentist. This is important.   Prevent an abscess  Brush your teeth every morning and every night. Use fluoride toothpaste.  Floss your teeth each day.  Get dental cleanings as often as told by your dentist.  Think about getting dental sealant put on teeth that have deep holes (decay).  Drink water that has fluoride in it. ? Most tap water has fluoride. ? Check the label on bottled water to see if it has fluoride in it.  Drink water instead of sugary drinks.  Eat healthy meals and snacks.  Wear a mouth guard or face shield when you play sports. Contact a doctor if:  Your pain is worse, and medicine does not help. Get help right away if:  You have a fever or chills.  Your symptoms suddenly get worse.  You have a very bad headache.  You have problems  breathing or swallowing.  You have trouble opening your mouth.  You have swelling in your neck or close to your eye. Summary  A dental abscess is an area of pus in or around a tooth. It is caused by an infection.  Treatment will help with symptoms and prevent the infection from spreading.  Take over-the-counter and prescription medicines only as told by your dentist.  To prevent an abscess, take good care of your teeth. Brush your teeth every morning and night. Use floss every day.  Get dental cleanings as often as told by your dentist. This information is not intended to replace advice given to you by your health care provider. Make sure you discuss any questions you have with your health care provider. Document Revised: 03/12/2020 Document Reviewed: 03/12/2020 Elsevier Patient Education  2021 Elsevier Inc.  

## 2020-12-05 ENCOUNTER — Emergency Department (HOSPITAL_BASED_OUTPATIENT_CLINIC_OR_DEPARTMENT_OTHER): Payer: Medicare HMO

## 2020-12-05 ENCOUNTER — Inpatient Hospital Stay (HOSPITAL_BASED_OUTPATIENT_CLINIC_OR_DEPARTMENT_OTHER)
Admission: EM | Admit: 2020-12-05 | Discharge: 2020-12-07 | DRG: 247 | Disposition: A | Discharge status: Home / Self Care | Payer: Medicare HMO | Attending: Internal Medicine | Admitting: Internal Medicine

## 2020-12-05 ENCOUNTER — Encounter (HOSPITAL_BASED_OUTPATIENT_CLINIC_OR_DEPARTMENT_OTHER): Payer: Self-pay | Admitting: Obstetrics and Gynecology

## 2020-12-05 ENCOUNTER — Other Ambulatory Visit: Payer: Self-pay

## 2020-12-05 DIAGNOSIS — Z888 Allergy status to other drugs, medicaments and biological substances status: Secondary | ICD-10-CM | POA: Diagnosis not present

## 2020-12-05 DIAGNOSIS — I25110 Atherosclerotic heart disease of native coronary artery with unstable angina pectoris: Principal | ICD-10-CM | POA: Diagnosis present

## 2020-12-05 DIAGNOSIS — I129 Hypertensive chronic kidney disease with stage 1 through stage 4 chronic kidney disease, or unspecified chronic kidney disease: Secondary | ICD-10-CM | POA: Diagnosis present

## 2020-12-05 DIAGNOSIS — Z20822 Contact with and (suspected) exposure to covid-19: Secondary | ICD-10-CM | POA: Diagnosis not present

## 2020-12-05 DIAGNOSIS — Z7984 Long term (current) use of oral hypoglycemic drugs: Secondary | ICD-10-CM

## 2020-12-05 DIAGNOSIS — E785 Hyperlipidemia, unspecified: Secondary | ICD-10-CM | POA: Diagnosis not present

## 2020-12-05 DIAGNOSIS — I480 Paroxysmal atrial fibrillation: Secondary | ICD-10-CM | POA: Diagnosis present

## 2020-12-05 DIAGNOSIS — F419 Anxiety disorder, unspecified: Secondary | ICD-10-CM | POA: Diagnosis present

## 2020-12-05 DIAGNOSIS — G2581 Restless legs syndrome: Secondary | ICD-10-CM | POA: Diagnosis present

## 2020-12-05 DIAGNOSIS — Z886 Allergy status to analgesic agent status: Secondary | ICD-10-CM

## 2020-12-05 DIAGNOSIS — Z955 Presence of coronary angioplasty implant and graft: Secondary | ICD-10-CM

## 2020-12-05 DIAGNOSIS — I25119 Atherosclerotic heart disease of native coronary artery with unspecified angina pectoris: Secondary | ICD-10-CM

## 2020-12-05 DIAGNOSIS — F32A Depression, unspecified: Secondary | ICD-10-CM | POA: Diagnosis present

## 2020-12-05 DIAGNOSIS — E1140 Type 2 diabetes mellitus with diabetic neuropathy, unspecified: Secondary | ICD-10-CM | POA: Diagnosis not present

## 2020-12-05 DIAGNOSIS — Z794 Long term (current) use of insulin: Secondary | ICD-10-CM

## 2020-12-05 DIAGNOSIS — E1122 Type 2 diabetes mellitus with diabetic chronic kidney disease: Secondary | ICD-10-CM | POA: Diagnosis present

## 2020-12-05 DIAGNOSIS — Z952 Presence of prosthetic heart valve: Secondary | ICD-10-CM

## 2020-12-05 DIAGNOSIS — Z79899 Other long term (current) drug therapy: Secondary | ICD-10-CM | POA: Diagnosis not present

## 2020-12-05 DIAGNOSIS — Z79890 Hormone replacement therapy (postmenopausal): Secondary | ICD-10-CM

## 2020-12-05 DIAGNOSIS — I2510 Atherosclerotic heart disease of native coronary artery without angina pectoris: Secondary | ICD-10-CM | POA: Diagnosis present

## 2020-12-05 DIAGNOSIS — Z96642 Presence of left artificial hip joint: Secondary | ICD-10-CM | POA: Diagnosis present

## 2020-12-05 DIAGNOSIS — Z7901 Long term (current) use of anticoagulants: Secondary | ICD-10-CM

## 2020-12-05 DIAGNOSIS — R079 Chest pain, unspecified: Secondary | ICD-10-CM | POA: Diagnosis not present

## 2020-12-05 DIAGNOSIS — I442 Atrioventricular block, complete: Secondary | ICD-10-CM | POA: Diagnosis not present

## 2020-12-05 DIAGNOSIS — Z6836 Body mass index (BMI) 36.0-36.9, adult: Secondary | ICD-10-CM

## 2020-12-05 DIAGNOSIS — E669 Obesity, unspecified: Secondary | ICD-10-CM | POA: Diagnosis present

## 2020-12-05 DIAGNOSIS — I712 Thoracic aortic aneurysm, without rupture: Secondary | ICD-10-CM | POA: Diagnosis present

## 2020-12-05 DIAGNOSIS — N189 Chronic kidney disease, unspecified: Secondary | ICD-10-CM | POA: Diagnosis present

## 2020-12-05 DIAGNOSIS — E039 Hypothyroidism, unspecified: Secondary | ICD-10-CM | POA: Diagnosis not present

## 2020-12-05 DIAGNOSIS — Z7982 Long term (current) use of aspirin: Secondary | ICD-10-CM

## 2020-12-05 DIAGNOSIS — Z0389 Encounter for observation for other suspected diseases and conditions ruled out: Secondary | ICD-10-CM | POA: Diagnosis not present

## 2020-12-05 DIAGNOSIS — G8929 Other chronic pain: Secondary | ICD-10-CM | POA: Diagnosis present

## 2020-12-05 DIAGNOSIS — Z8249 Family history of ischemic heart disease and other diseases of the circulatory system: Secondary | ICD-10-CM

## 2020-12-05 DIAGNOSIS — H548 Legal blindness, as defined in USA: Secondary | ICD-10-CM | POA: Diagnosis present

## 2020-12-05 DIAGNOSIS — E782 Mixed hyperlipidemia: Secondary | ICD-10-CM | POA: Diagnosis not present

## 2020-12-05 DIAGNOSIS — K047 Periapical abscess without sinus: Secondary | ICD-10-CM | POA: Diagnosis present

## 2020-12-05 DIAGNOSIS — R69 Illness, unspecified: Secondary | ICD-10-CM | POA: Diagnosis not present

## 2020-12-05 DIAGNOSIS — I200 Unstable angina: Secondary | ICD-10-CM | POA: Diagnosis not present

## 2020-12-05 DIAGNOSIS — Z950 Presence of cardiac pacemaker: Secondary | ICD-10-CM | POA: Diagnosis not present

## 2020-12-05 LAB — RESP PANEL BY RT-PCR (FLU A&B, COVID) ARPGX2
Influenza A by PCR: NEGATIVE
Influenza B by PCR: NEGATIVE
SARS Coronavirus 2 by RT PCR: NEGATIVE

## 2020-12-05 LAB — BASIC METABOLIC PANEL
Anion gap: 8 (ref 5–15)
BUN: 22 mg/dL (ref 8–23)
CO2: 27 mmol/L (ref 22–32)
Calcium: 8.8 mg/dL — ABNORMAL LOW (ref 8.9–10.3)
Chloride: 102 mmol/L (ref 98–111)
Creatinine, Ser: 1.26 mg/dL — ABNORMAL HIGH (ref 0.61–1.24)
GFR, Estimated: >60 mL/min (ref >60)
Glucose, Bld: 161 mg/dL — ABNORMAL HIGH (ref 70–99)
Potassium: 4.3 mmol/L (ref 3.5–5.1)
Sodium: 137 mmol/L (ref 135–145)

## 2020-12-05 LAB — HEMOGLOBIN A1C
Hgb A1c MFr Bld: 6.6 % — ABNORMAL HIGH (ref 4.8–5.6)
Mean Plasma Glucose: 142.72 mg/dL

## 2020-12-05 LAB — PROTIME-INR
INR: 1.2 (ref 0.8–1.2)
Prothrombin Time: 14.8 seconds (ref 11.4–15.2)

## 2020-12-05 LAB — GLUCOSE, CAPILLARY
Glucose-Capillary: 138 mg/dL — ABNORMAL HIGH (ref 70–99)
Glucose-Capillary: 117 mg/dL — ABNORMAL HIGH (ref 70–99)

## 2020-12-05 LAB — CBC
HCT: 39.4 % (ref 39.0–52.0)
Hemoglobin: 13 g/dL (ref 13.0–17.0)
MCH: 30.1 pg (ref 26.0–34.0)
MCHC: 33 g/dL (ref 30.0–36.0)
MCV: 91.2 fL (ref 80.0–100.0)
Platelets: 169 10*3/uL (ref 150–400)
RBC: 4.32 MIL/uL (ref 4.22–5.81)
RDW: 14.7 % (ref 11.5–15.5)
WBC: 9.7 10*3/uL (ref 4.0–10.5)
nRBC: 0 % (ref 0.0–0.2)

## 2020-12-05 LAB — TROPONIN I (HIGH SENSITIVITY)
Troponin I (High Sensitivity): 11 ng/L (ref <18)
Troponin I (High Sensitivity): 10 ng/L (ref <18)

## 2020-12-05 LAB — BRAIN NATRIURETIC PEPTIDE: B Natriuretic Peptide: 144.3 pg/mL — ABNORMAL HIGH (ref 0.0–100.0)

## 2020-12-05 MED ORDER — ACETAMINOPHEN 500 MG PO TABS
1000.0000 mg | ORAL_TABLET | Freq: Four times a day (QID) | ORAL | Status: DC
  Administered 2020-12-05 – 2020-12-07 (×4): 1000 mg via ORAL
  Filled 2020-12-05 (×5): qty 2

## 2020-12-05 MED ORDER — POLYETHYLENE GLYCOL 3350 17 G PO PACK: 17.0000 g | PACK | Freq: Every day | ORAL | Status: DC | PRN

## 2020-12-05 MED ORDER — PRAVASTATIN SODIUM 40 MG PO TABS
40.0000 mg | ORAL_TABLET | Freq: Every day | ORAL | Status: DC
  Administered 2020-12-06 – 2020-12-07 (×2): 40 mg via ORAL
  Filled 2020-12-05 (×2): qty 1

## 2020-12-05 MED ORDER — AMIODARONE HCL 200 MG PO TABS
200.0000 mg | ORAL_TABLET | Freq: Two times a day (BID) | ORAL | Status: DC
  Administered 2020-12-05: 200 mg via ORAL
  Filled 2020-12-05 (×2): qty 1

## 2020-12-05 MED ORDER — NITROGLYCERIN 0.4 MG SL SUBL
0.4000 mg | SUBLINGUAL_TABLET | SUBLINGUAL | Status: DC | PRN
  Administered 2020-12-05: 0.4 mg via SUBLINGUAL
  Filled 2020-12-05: qty 1

## 2020-12-05 MED ORDER — OXYCODONE HCL 5 MG PO TABS
15.0000 mg | ORAL_TABLET | ORAL | Status: DC | PRN
  Administered 2020-12-05 – 2020-12-07 (×7): 15 mg via ORAL
  Filled 2020-12-05 (×6): qty 3

## 2020-12-05 MED ORDER — HEPARIN BOLUS VIA INFUSION
4000.0000 [IU] | Freq: Once | INTRAVENOUS | Status: AC
  Administered 2020-12-05: 4000 [IU] via INTRAVENOUS
  Filled 2020-12-05: qty 4000

## 2020-12-05 MED ORDER — LEVOTHYROXINE SODIUM 100 MCG PO TABS
100.0000 ug | ORAL_TABLET | Freq: Every day | ORAL | Status: DC
  Administered 2020-12-06 – 2020-12-07 (×2): 100 ug via ORAL
  Filled 2020-12-05 (×2): qty 1

## 2020-12-05 MED ORDER — INSULIN ASPART 100 UNIT/ML ~~LOC~~ SOLN
0.0000 [IU] | SUBCUTANEOUS | Status: DC
  Administered 2020-12-05 – 2020-12-06 (×2): 1 [IU] via SUBCUTANEOUS
  Administered 2020-12-06: 3 [IU] via SUBCUTANEOUS

## 2020-12-05 MED ORDER — IOHEXOL 350 MG/ML SOLN
100.0000 mL | Freq: Once | INTRAVENOUS | Status: AC | PRN
  Administered 2020-12-05: 100 mL via INTRAVENOUS

## 2020-12-05 MED ORDER — SERTRALINE HCL 100 MG PO TABS
100.0000 mg | ORAL_TABLET | Freq: Every day | ORAL | Status: DC
  Administered 2020-12-06 – 2020-12-07 (×2): 100 mg via ORAL
  Filled 2020-12-05 (×2): qty 1

## 2020-12-05 MED ORDER — HEPARIN (PORCINE) 25000 UT/250ML-% IV SOLN
1400.0000 [IU]/h | INTRAVENOUS | Status: DC
  Administered 2020-12-05 – 2020-12-06 (×2): 1400 [IU]/h via INTRAVENOUS
  Filled 2020-12-05: qty 250

## 2020-12-05 MED ORDER — ADULT MULTIVITAMIN W/MINERALS CH
1.0000 | ORAL_TABLET | Freq: Every day | ORAL | Status: DC
  Administered 2020-12-06 – 2020-12-07 (×2): 1 via ORAL
  Filled 2020-12-05 (×2): qty 1

## 2020-12-05 MED ORDER — ONDANSETRON HCL 4 MG/2ML IJ SOLN: 4.0000 mg | Freq: Four times a day (QID) | INTRAMUSCULAR | Status: DC | PRN

## 2020-12-05 MED ORDER — WARFARIN - PHARMACIST DOSING INPATIENT: Freq: Every day | Status: DC

## 2020-12-05 MED ORDER — CLINDAMYCIN HCL 300 MG PO CAPS
300.0000 mg | ORAL_CAPSULE | Freq: Four times a day (QID) | ORAL | Status: DC
  Administered 2020-12-05 – 2020-12-07 (×5): 300 mg via ORAL
  Filled 2020-12-05 (×10): qty 1

## 2020-12-05 MED ORDER — WARFARIN SODIUM 10 MG PO TABS
10.0000 mg | ORAL_TABLET | Freq: Once | ORAL | Status: AC
  Administered 2020-12-05: 10 mg via ORAL
  Filled 2020-12-05: qty 1

## 2020-12-05 MED ORDER — ACETAMINOPHEN 500 MG PO TABS
1000.0000 mg | ORAL_TABLET | Freq: Once | ORAL | Status: AC
  Administered 2020-12-05: 1000 mg via ORAL
  Filled 2020-12-05: qty 2

## 2020-12-05 NOTE — ED Notes (Addendum)
Patient arrives to the dept with CP. Patient has a hx of cardiac related medical conditions, but denies pulmonary hx. No respiratory medications in MAR. Patient denies shob, however appears DOE and at rest; he endorses anxiety at this time. Patient O2 96-97% on RA, but was placed on supplemental O2 for visual shob and CP. BBS currently clear and diminished. EKG was obtained on arrival. RT will continue to monitor patient.

## 2020-12-05 NOTE — ED Notes (Signed)
Patient in CT, will obtain BNP and repeat trop when returned
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
## 2020-12-05 NOTE — ED Notes (Signed)
Verbal confirmation of pacemaker functioning properly with "no recent arrhythmias" per Remus Blake [employer]'s representative.  Written report to follow via fax.

## 2020-12-05 NOTE — ED Notes (Signed)
RN called and updated patient's son at patient's request
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
## 2020-12-05 NOTE — Plan of Care (Signed)
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
## 2020-12-05 NOTE — ED Triage Notes (Signed)
Patient reports to the ER for chest pain. Patient describes this pain as a new sensation that started x30 minutes ago. Denies nausea, emesis, or diarrhea. Patient endorses anxiety due to extensive hx.  Reports fall x1 week ago with chipped tooth

## 2020-12-06 ENCOUNTER — Inpatient Hospital Stay (HOSPITAL_COMMUNITY)
Admission: EM | Disposition: A | Discharge status: Home / Self Care | Payer: Self-pay | Source: Home / Self Care | Attending: Internal Medicine

## 2020-12-06 DIAGNOSIS — I25110 Atherosclerotic heart disease of native coronary artery with unstable angina pectoris: Secondary | ICD-10-CM | POA: Diagnosis not present

## 2020-12-06 DIAGNOSIS — I480 Paroxysmal atrial fibrillation: Secondary | ICD-10-CM | POA: Diagnosis not present

## 2020-12-06 DIAGNOSIS — I2510 Atherosclerotic heart disease of native coronary artery without angina pectoris: Secondary | ICD-10-CM | POA: Diagnosis not present

## 2020-12-06 DIAGNOSIS — I200 Unstable angina: Secondary | ICD-10-CM | POA: Diagnosis not present

## 2020-12-06 DIAGNOSIS — Z952 Presence of prosthetic heart valve: Secondary | ICD-10-CM | POA: Diagnosis not present

## 2020-12-06 DIAGNOSIS — I25119 Atherosclerotic heart disease of native coronary artery with unspecified angina pectoris: Secondary | ICD-10-CM | POA: Diagnosis not present

## 2020-12-06 DIAGNOSIS — E782 Mixed hyperlipidemia: Secondary | ICD-10-CM

## 2020-12-06 DIAGNOSIS — R079 Chest pain, unspecified: Secondary | ICD-10-CM | POA: Diagnosis not present

## 2020-12-06 HISTORY — PX: CORONARY ANGIOGRAPHY: CATH118303

## 2020-12-06 HISTORY — PX: CORONARY STENT INTERVENTION: CATH118234

## 2020-12-06 LAB — CBC
HCT: 36.9 % — ABNORMAL LOW (ref 39.0–52.0)
Hemoglobin: 12.4 g/dL — ABNORMAL LOW (ref 13.0–17.0)
MCH: 29.7 pg (ref 26.0–34.0)
MCHC: 33.6 g/dL (ref 30.0–36.0)
MCV: 88.5 fL (ref 80.0–100.0)
Platelets: 156 10*3/uL (ref 150–400)
RBC: 4.17 MIL/uL — ABNORMAL LOW (ref 4.22–5.81)
RDW: 14.6 % (ref 11.5–15.5)
WBC: 8.1 10*3/uL (ref 4.0–10.5)
nRBC: 0 % (ref 0.0–0.2)

## 2020-12-06 LAB — HEPARIN LEVEL (UNFRACTIONATED)
Heparin Unfractionated: 0.52 IU/mL (ref 0.30–0.70)
Heparin Unfractionated: 0.44 IU/mL (ref 0.30–0.70)

## 2020-12-06 LAB — POCT ACTIVATED CLOTTING TIME
Activated Clotting Time: 303 seconds
Activated Clotting Time: 279 seconds
Activated Clotting Time: 208 seconds
Activated Clotting Time: 190 seconds
Activated Clotting Time: 172 seconds

## 2020-12-06 LAB — TSH: TSH: 8.548 u[IU]/mL — ABNORMAL HIGH (ref 0.350–4.500)

## 2020-12-06 LAB — GLUCOSE, CAPILLARY
Glucose-Capillary: 102 mg/dL — ABNORMAL HIGH (ref 70–99)
Glucose-Capillary: 104 mg/dL — ABNORMAL HIGH (ref 70–99)
Glucose-Capillary: 132 mg/dL — ABNORMAL HIGH (ref 70–99)
Glucose-Capillary: 147 mg/dL — ABNORMAL HIGH (ref 70–99)
Glucose-Capillary: 212 mg/dL — ABNORMAL HIGH (ref 70–99)
Glucose-Capillary: 90 mg/dL (ref 70–99)

## 2020-12-06 SURGERY — CORONARY STENT INTERVENTION
Anesthesia: LOCAL

## 2020-12-06 MED ORDER — OXYCODONE HCL 5 MG PO TABS
ORAL_TABLET | ORAL | Status: AC
  Filled 2020-12-06: qty 3

## 2020-12-06 MED ORDER — SODIUM CHLORIDE 0.9 % IV SOLN: 250.0000 mL | INTRAVENOUS | Status: DC | PRN

## 2020-12-06 MED ORDER — HYDRALAZINE HCL 20 MG/ML IJ SOLN
INTRAMUSCULAR | Status: AC
  Filled 2020-12-06: qty 1

## 2020-12-06 MED ORDER — MIDAZOLAM HCL 2 MG/2ML IJ SOLN
INTRAMUSCULAR | Status: AC
  Filled 2020-12-06: qty 2

## 2020-12-06 MED ORDER — FENTANYL CITRATE (PF) 100 MCG/2ML IJ SOLN
INTRAMUSCULAR | Status: AC
  Filled 2020-12-06: qty 2

## 2020-12-06 MED ORDER — CLOPIDOGREL BISULFATE 300 MG PO TABS
ORAL_TABLET | ORAL | Status: AC
  Filled 2020-12-06: qty 2

## 2020-12-06 MED ORDER — FENTANYL CITRATE (PF) 100 MCG/2ML IJ SOLN
INTRAMUSCULAR | Status: DC | PRN
  Administered 2020-12-06 (×3): 25 ug via INTRAVENOUS

## 2020-12-06 MED ORDER — VERAPAMIL HCL 2.5 MG/ML IV SOLN
INTRAVENOUS | Status: AC
  Filled 2020-12-06: qty 2

## 2020-12-06 MED ORDER — LIDOCAINE HCL (PF) 1 % IJ SOLN
INTRAMUSCULAR | Status: AC
  Filled 2020-12-06: qty 30

## 2020-12-06 MED ORDER — SODIUM CHLORIDE 0.9% FLUSH
3.0000 mL | Freq: Two times a day (BID) | INTRAVENOUS | Status: DC
  Administered 2020-12-06: 3 mL via INTRAVENOUS

## 2020-12-06 MED ORDER — SODIUM CHLORIDE 0.9 % IV SOLN: INTRAVENOUS | Status: AC

## 2020-12-06 MED ORDER — FAMOTIDINE IN NACL 20-0.9 MG/50ML-% IV SOLN
INTRAVENOUS | Status: AC
  Filled 2020-12-06: qty 50

## 2020-12-06 MED ORDER — TRAZODONE HCL 100 MG PO TABS: 100.0000 mg | ORAL_TABLET | Freq: Every evening | ORAL | Status: DC | PRN

## 2020-12-06 MED ORDER — HYDRALAZINE HCL 20 MG/ML IJ SOLN
INTRAMUSCULAR | Status: DC | PRN
  Administered 2020-12-06 (×2): 10 mg via INTRAVENOUS

## 2020-12-06 MED ORDER — HEPARIN (PORCINE) IN NACL 1000-0.9 UT/500ML-% IV SOLN
INTRAVENOUS | Status: DC | PRN
  Administered 2020-12-06: 500 mL

## 2020-12-06 MED ORDER — MORPHINE SULFATE (PF) 2 MG/ML IV SOLN: 2.0000 mg | INTRAVENOUS | Status: DC | PRN

## 2020-12-06 MED ORDER — SODIUM CHLORIDE 0.9 % WEIGHT BASED INFUSION
3.0000 mL/kg/h | INTRAVENOUS | Status: AC
  Administered 2020-12-06: 3 mL/kg/h via INTRAVENOUS

## 2020-12-06 MED ORDER — ONDANSETRON HCL 4 MG/2ML IJ SOLN: 4.0000 mg | Freq: Four times a day (QID) | INTRAMUSCULAR | Status: DC | PRN

## 2020-12-06 MED ORDER — LIDOCAINE HCL (PF) 1 % IJ SOLN
INTRAMUSCULAR | Status: DC | PRN
  Administered 2020-12-06: 15 mL

## 2020-12-06 MED ORDER — SODIUM CHLORIDE 0.9 % WEIGHT BASED INFUSION
1.0000 mL/kg/h | INTRAVENOUS | Status: DC
  Administered 2020-12-06: 1 mL/kg/h via INTRAVENOUS

## 2020-12-06 MED ORDER — HEPARIN SODIUM (PORCINE) 1000 UNIT/ML IJ SOLN
INTRAMUSCULAR | Status: DC | PRN
  Administered 2020-12-06: 10000 [IU] via INTRAVENOUS

## 2020-12-06 MED ORDER — HEPARIN SODIUM (PORCINE) 1000 UNIT/ML IJ SOLN
INTRAMUSCULAR | Status: AC
  Filled 2020-12-06: qty 1

## 2020-12-06 MED ORDER — AMLODIPINE BESYLATE 2.5 MG PO TABS
2.5000 mg | ORAL_TABLET | Freq: Every day | ORAL | Status: DC
  Administered 2020-12-06 – 2020-12-07 (×2): 2.5 mg via ORAL
  Filled 2020-12-06 (×2): qty 1

## 2020-12-06 MED ORDER — IOHEXOL 350 MG/ML SOLN
INTRAVENOUS | Status: DC | PRN
Start: 2020-12-06 — End: 2020-12-06
  Administered 2020-12-06: 150 mL

## 2020-12-06 MED ORDER — SODIUM CHLORIDE 0.9 % WEIGHT BASED INFUSION: 1.0000 mL/kg/h | INTRAVENOUS | Status: DC

## 2020-12-06 MED ORDER — ASPIRIN EC 81 MG PO TBEC: 81.0000 mg | DELAYED_RELEASE_TABLET | Freq: Every day | ORAL | Status: DC

## 2020-12-06 MED ORDER — SODIUM CHLORIDE 0.9% FLUSH: 3.0000 mL | INTRAVENOUS | Status: DC | PRN

## 2020-12-06 MED ORDER — HEPARIN (PORCINE) IN NACL 1000-0.9 UT/500ML-% IV SOLN
INTRAVENOUS | Status: AC
  Filled 2020-12-06: qty 1000

## 2020-12-06 MED ORDER — ASPIRIN 81 MG PO CHEW
81.0000 mg | CHEWABLE_TABLET | ORAL | Status: AC
  Administered 2020-12-06: 81 mg via ORAL
  Filled 2020-12-06: qty 1

## 2020-12-06 MED ORDER — NITROGLYCERIN 1 MG/10 ML FOR IR/CATH LAB
INTRA_ARTERIAL | Status: AC
  Filled 2020-12-06: qty 10

## 2020-12-06 MED ORDER — FAMOTIDINE IN NACL 20-0.9 MG/50ML-% IV SOLN
INTRAVENOUS | Status: AC | PRN
  Administered 2020-12-06: 20 mg via INTRAVENOUS

## 2020-12-06 MED ORDER — SODIUM CHLORIDE 0.9 % WEIGHT BASED INFUSION: 3.0000 mL/kg/h | INTRAVENOUS | Status: DC

## 2020-12-06 MED ORDER — MIDAZOLAM HCL 2 MG/2ML IJ SOLN
INTRAMUSCULAR | Status: DC | PRN
  Administered 2020-12-06 (×4): 1 mg via INTRAVENOUS

## 2020-12-06 MED ORDER — SALINE SPRAY 0.65 % NA SOLN
1.0000 | NASAL | Status: DC | PRN
  Administered 2020-12-06: 1 via NASAL
  Filled 2020-12-06: qty 44

## 2020-12-06 MED ORDER — ASPIRIN 81 MG PO CHEW: 81.0000 mg | CHEWABLE_TABLET | ORAL | Status: DC

## 2020-12-06 MED ORDER — CLOPIDOGREL BISULFATE 300 MG PO TABS
ORAL_TABLET | ORAL | Status: DC | PRN
  Administered 2020-12-06: 600 mg via ORAL

## 2020-12-06 SURGICAL SUPPLY — 20 items
BALLN SAPPHIRE 2.0X12 (BALLOONS) ×2
BALLN SAPPHIRE ~~LOC~~ 3.25X12 (BALLOONS) ×1 IMPLANT
BALLN SAPPHIRE ~~LOC~~ 3.25X15 (BALLOONS) ×1 IMPLANT
BALLOON SAPPHIRE 2.0X12 (BALLOONS) IMPLANT
CATH INFINITI 5FR MULTPACK ANG (CATHETERS) ×1 IMPLANT
CATH LAUNCHER 6FR JR4 (CATHETERS) ×1 IMPLANT
CATH VISTA GUIDE 6FR XB3.5 (CATHETERS) ×1 IMPLANT
KIT ENCORE 26 ADVANTAGE (KITS) ×1 IMPLANT
KIT HEART LEFT (KITS) ×2 IMPLANT
MAT PREVALON FULL STRYKER (MISCELLANEOUS) ×1 IMPLANT
PACK CARDIAC CATHETERIZATION (CUSTOM PROCEDURE TRAY) ×2 IMPLANT
SHEATH PINNACLE 5F 10CM (SHEATH) ×1 IMPLANT
SHEATH PINNACLE 6F 10CM (SHEATH) ×1 IMPLANT
STENT RESOLUTE ONYX 3.0X18 (Permanent Stent) ×1 IMPLANT
STENT SYNERGY XD 3.0X24 (Permanent Stent) IMPLANT
SYNERGY XD 3.0X24 (Permanent Stent) ×2 IMPLANT
TRANSDUCER W/STOPCOCK (MISCELLANEOUS) ×2 IMPLANT
TUBING CIL FLEX 10 FLL-RA (TUBING) ×2 IMPLANT
WIRE ASAHI PROWATER 180CM (WIRE) ×1 IMPLANT
WIRE EMERALD 3MM-J .035X150CM (WIRE) ×1 IMPLANT

## 2020-12-06 NOTE — Progress Notes (Addendum)
Site area: Right groin a 6 french arterial sheath was removed  Site Prior to Removal:  Level 0  Pressure Applied For 30 MINUTES    Bedrest Beginning at 2040 X 6 hours  Manual:   Yes.    Patient Status During Pull:  stable  Post Pull Groin Site:  Level 0  Post Pull Instructions Given:  Yes.    Post Pull Pulses Present:  Yes.    Dressing Applied:  Yes.    Comments:

## 2020-12-06 NOTE — Progress Notes (Addendum)
 Progress Note  Patient Name: Andre Malone Date of Encounter: 12/06/2020  [facility] Cardiologist: Klein   Subjective   69-year-old gentleman with a history of coronary artery disease, hypertension, hyperlipidemia, pacemaker for complete heart block.  He has severe aortic stenosis and a ascending thoracic aortic aneurysm.  He is status post placement of his aortic valve as well as his aortic root using a 21 mm Saint Jude valve conduit by Dr. Bartle in 2010.  He was admitted with episodes of chest pain.  Chest pain . Tightness lasted for 3-4 hours.  Not aggrivated by twisting / turning/ eating / drinking Did not feel like MSK pain  troponins are negative ,  Has knows disease in his PDA  Lipids have been very high / poorly controlled.  Will schedule him for cath     Inpatient Medications    Scheduled Meds: . acetaminophen  1,000 mg Oral Q6H  . amiodarone  200 mg Oral BID  . clindamycin  300 mg Oral QID  . insulin aspart  0-9 Units Subcutaneous Q4H  . levothyroxine  100 mcg Oral QAC breakfast  . multivitamin with minerals  1 tablet Oral Daily  . pravastatin  40 mg Oral Daily  . sertraline  100 mg Oral Daily  . Warfarin - Pharmacist Dosing Inpatient   Does not apply q1600   Continuous Infusions: . heparin 1,400 Units/hr (12/05/20 2149)   PRN Meds: nitroGLYCERIN, ondansetron (ZOFRAN) IV, oxyCODONE, polyethylene glycol   Vital Signs    Vitals:   12/05/20 1955 12/06/20 0025 12/06/20 0031 12/06/20 0358  BP: (!) 150/80 (!) 160/71  (!) 161/79  Pulse: 63 (!) 59  (!) 59  Resp: 20 19  19  Temp: 98 F (36.7 C) 97.7 F (36.5 C)  97.7 F (36.5 C)  TempSrc: Oral Oral  Oral  SpO2: 98% 95%  98%  Weight:   101.9 kg   Height:        Intake/Output Summary (Last 24 hours) at 12/06/2020 0817 Last data filed at 12/06/2020 0600 Gross per 24 hour  Intake 533.53 ml  Output 1000 ml  Net -466.47 ml   Last 3 Weights 12/06/2020 12/05/2020 12/05/2020  Weight (lbs) 224 lb 11.2 oz 227 lb  1.6 oz 230 lb  Weight (kg) 101.923 kg 103.012 kg 104.327 kg      Telemetry     NSR - Personally Reviewed  ECG    nsr  - Personally Reviewed  Physical Exam   GEN: middle age male,  NAD .   Neck: No JVD Cardiac: RRR, mechanical S2.  Respiratory: Clear to auscultation bilaterally. GI: Soft, nontender, non-distended  MS: No edema; No deformity. Neuro:  Nonfocal  Psych: Normal affect   Labs    High Sensitivity Troponin:   Recent Labs  Lab 12/05/20 1210 12/05/20 1439  TROPONINIHS 11 10      Chemistry Recent Labs  Lab 12/05/20 1210  NA 137  K 4.3  CL 102  CO2 27  GLUCOSE 161*  BUN 22  CREATININE 1.26*  CALCIUM 8.8*  GFRNONAA >60  ANIONGAP 8     Hematology Recent Labs  Lab 12/05/20 1210 12/06/20 0556  WBC 9.7 8.1  RBC 4.32 4.17*  HGB 13.0 12.4*  HCT 39.4 36.9*  MCV 91.2 88.5  MCH 30.1 29.7  MCHC 33.0 33.6  RDW 14.7 14.6  PLT 169 156    BNP Recent Labs  Lab 12/05/20 1439  BNP 144.3*     DDimer No results   for input(s): DDIMER in the last 168 hours.   Radiology    DG Chest 2 View  Result Date: 12/05/2020 CLINICAL DATA:  Chest pain EXAM: CHEST - 2 VIEW COMPARISON:  12/02/2018 FINDINGS: Grossly unchanged borderline enlarged cardiac silhouette and mediastinal contours with atherosclerotic plaque when thoracic aorta. Stable position of support apparatus. Sequela of previous median sternotomy and valve repair. Grossly unchanged bilateral infrahilar opacities favored to represent atelectasis. No new focal airspace opacities. No pleural effusion or pneumothorax. No evidence of edema. No acute osseous abnormalities. IMPRESSION: Similar findings of cardiomegaly and bilateral infrahilar atelectasis without post superimposed acute cardiopulmonary disease. Electronically Signed   By: Simonne Come M.D.   On: 12/05/2020 12:53   CT Angio Chest PE W/Cm &/Or Wo Cm  Result Date: 12/05/2020 CLINICAL DATA:  Concern for pulmonary embolism.  High probability EXAM: CT  ANGIOGRAPHY CHEST WITH CONTRAST TECHNIQUE: Multidetector CT imaging of the chest was performed using the standard protocol during bolus administration of intravenous contrast. Multiplanar CT image reconstructions and MIPs were obtained to evaluate the vascular anatomy. CONTRAST:  OMNIPAQUE IOHEXOL 350 MG/ML SOLN COMPARISON:  Chest CT 02/20/2020 FINDINGS: Cardiovascular: No filling defects within the pulmonary arteries to suggest acute pulmonary embolism. Post CABG. Mediastinum/Nodes: No axillary or supraclavicular adenopathy. No pericardial fluid. Esophagus normal. 14 mm RIGHT lower paratracheal node. 13 mm subcarinal node. Nodes unchanged from 02/20/2020 Lungs/Pleura: No pulmonary infarction. Subtle centrilobular nodularity in the lower lobes. No measurable nodule. No pleural fluid or pneumothorax Upper Abdomen: Limited view of the liver, kidneys, pancreas are unremarkable. Normal adrenal glands. Musculoskeletal: No aggressive osseous lesion Review of the MIP images confirms the above findings. IMPRESSION: 1. No evidence acute pulmonary embolism. 2. Fine nodular pattern in the lung bases suggest atypical infection or bronchiolitis. No focal consolidation. 3. Mild mediastinal adenopathy is favored reactive. No change from prior. Electronically Signed   By: Genevive Bi M.D.   On: 12/05/2020 15:29    Cardiac Studies      Patient Profile     70 y.o. male with AS, s/p AVR and aortic root replacement , admitted with chest pain   Assessment & Plan    1.  Chest pain: Andre Malone presents with episodes of chest pain.  The episode lasted 3 to 4 hours.  The troponins were negative.  The pain was not associated with deep breath, eating, drinking, change of position.  It was not pleuritic.  He has known coronary artery disease.  His lipids have been very poorly controlled.  His last lipid panel from November, 2021 showed an LDL of 193.  His triglyceride level is 219.  Total cholesterol 273.  I think her  best option is to proceed with heart catheterization. We reviewed the risks, benefits, options of heart catheterization.  He understands and agrees to proceed.  2.  Hyperlipidemia:  He is on pravastatin .  Has statin intolerance apparently .  He will need to be referred to lipid clinic for consideration for PCSK9 inhibitor .  Will put in for a lipid consult .   3.  S/p AVR:   His INR is now close to normal.  He is currently on heparin.  We will restart warfarin following his heart catheterization.  4.  Hypothyroidism:   Check TSH today .   Cont meds.  Further plans per IM       For questions or updates, please contact [facility] Please consult [email or web address] for contact info under  Signed, Kristeen Miss, MD  12/06/2020, 8:17 AM

## 2020-12-06 NOTE — Progress Notes (Signed)
PHARMACIST LIPID MONITORING   Andre Malone is a 70 y.o. male admitted on 12/05/2020 with chest pain.  Pharmacy has been consulted to optimize lipid-lowering therapy with the indication of secondary prevention for clinical ASCVD.  Recent Labs:  Lipid Panel 07/23/20:  Total CHOL/HDL 7.2 Total CHOL 273 HDL 38 TG 219 VLDL 42 LDL 829  Hepatic function panel 07/23/20: Wnl   SCr (since admission):   Serum creatinine: 1.26 mg/dL (H) 56/21/30 8657 Estimated creatinine clearance: 61.8 mL/min (A)  Current therapy and lipid therapy tolerance Current lipid-lowering therapy: pravastatin 40mg  Previous lipid-lowering therapies (if applicable): Atorvastatin and simvastatin listed in 2014 but patient does not remember taking these Documented or reported allergies or intolerances to lipid-lowering therapies (if applicable): patient confirmed never had any   Assessment:   Patient agrees with changes to lipid-lowering therapy  Plan:    1.Statin intensity (high intensity recommended for all patients regardless of the LDL):  Add or increase statin to high intensity. Atorvastatin 80mg . Patient educated on possible adverse effects.   2.Follow-up with:  Cardiology provider   2015, PharmD, BCPS, [facility] Clinical Pharmacist  Please check AMION for all [facility] Pharmacy phone numbers After 10:00 PM, call Main Pharmacy [phone]

## 2020-12-06 NOTE — Progress Notes (Signed)
[facility]                                                                              Patient Demographics  Andre Malone, is a 70 y.o. male, DOB - 1951-03-24, XAJ:[id number]  Admit date - 12/05/2020   Admitting Physician Laqueta Due, DO  Outpatient Primary MD for the patient is Bennie Pierini, FNP  Outpatient specialists:   LOS - 1  days   Medical records reviewed and are as summarized below:    Chief Complaint  Patient presents with  . Chest Pain       Brief summary   Patient is a 70 year old male with history of CAD, hypertension, hyperlipidemia, pacemaker for complete heart block, severe aortic stenosis, ascending thoracic aortic aneurysm.  Status post mechanical aortic valve presented with chest pain, tightness lasting for 3 to 4 hours.  Patient reported the chest pain is right-sided, started 30 minutes prior to coming to ED, occurred at rest while watching TV.  Pain was constant with no relief. EKG showed no acute ischemic changes, troponins negative x2.  No associated shortness of breath, nausea, palpitations. Cardiology consulted.  Assessment & Plan    Principal Problem:   Chest pain, atypical, has history of CAD with significant PDA lesion -Cardiology consulted, recommended cardiac cath -Continue n.p.o. status, on IV heparin drip  Active Problems:   Type 2 diabetes mellitus with diabetic neuropathy, with long-term current use of insulin (HCC) -Hemoglobin A1c 6.6, hold oral hypoglycemics -Continue sliding scale insulin every 4 hours until patient back on diet -CBGs fairly controlled currently    Paroxysmal atrial fibrillation (HCC),  S/P AVR (aortic valve replacement) -Continue to hold Coumadin given plan for cardiac cath -Patient was placed on antibiotics recently due to dental infection, per cardiology will obtain 2D echo to reassess function of prosthetic AV. -Continue clindamycin  Hypothyroidism -TSH 8.5 -Follow free T4,  T3, for now continue Synthroid 100 MCG daily, may need to increase pending results  Hyperlipidemia -Continue pravastatin, apparently has statin intolerance, will be referred to lipid clinic.  Anxiety -Continue Zoloft  Obesity Estimated body mass index is 36.27 kg/m as calculated from the following:   Height as of this encounter: 5\' 6"  (1.676 m).   Weight as of this encounter: 101.9 kg.  Code Status: Full CODE STATUS DVT Prophylaxis:  Heparin drip   Level of Care: Level of care: Telemetry Cardiac Family Communication: Discussed all imaging results, lab results, explained to the patient    Disposition Plan:     Status is: Inpatient  Remains inpatient appropriate because:Inpatient level of care appropriate due to severity of illness   Dispo: The patient is from: Home              Anticipated d/c is to: Home              Patient currently is not medically stable to d/c.  Pending cardiac cath   Difficult to place patient No      Time Spent in minutes 35 minutes  Procedures:  None  Consultants:   Cardiology  Antimicrobials:   Anti-infectives (From admission, onward)   Start  Dose/Rate Route Frequency Ordered Stop   12/05/20 2200  clindamycin (CLEOCIN) capsule 300 mg        300 mg Oral 4 times daily 12/05/20 1922            Medications  Scheduled Meds: . acetaminophen  1,000 mg Oral Q6H  . clindamycin  300 mg Oral QID  . insulin aspart  0-9 Units Subcutaneous Q4H  . levothyroxine  100 mcg Oral QAC breakfast  . multivitamin with minerals  1 tablet Oral Daily  . pravastatin  40 mg Oral Daily  . sertraline  100 mg Oral Daily  . sodium chloride flush  3 mL Intravenous Q12H  . Warfarin - Pharmacist Dosing Inpatient   Does not apply q1600   Continuous Infusions: . sodium chloride    . sodium chloride    . heparin 1,400 Units/hr (12/05/20 2149)   PRN Meds:.sodium chloride, nitroGLYCERIN, ondansetron (ZOFRAN) IV, oxyCODONE, polyethylene glycol, sodium  chloride, sodium chloride flush      Subjective:   Xsavier Seeley was seen and examined today.  Chest pain currently resolved, awaiting procedure.  No acute shortness of breath. Patient denies dizziness, abdominal pain, N/V/D/C, new weakness, numbess, tingling. No acute events overnight.    Objective:   Vitals:   12/06/20 0031 12/06/20 0358 12/06/20 0902 12/06/20 1212  BP:  (!) 161/79 119/73 140/70  Pulse:  (!) 59 63 63  Resp:  19    Temp:  97.7 F (36.5 C) 98 F (36.7 C) 97.7 F (36.5 C)  TempSrc:  Oral Oral Oral  SpO2:  98% 96% 96%  Weight: 101.9 kg     Height:        Intake/Output Summary (Last 24 hours) at 12/06/2020 1319 Last data filed at 12/06/2020 1300 Gross per 24 hour  Intake 590.7 ml  Output 1600 ml  Net -1009.3 ml     Wt Readings from Last 3 Encounters:  12/06/20 101.9 kg  12/02/20 105.5 kg  10/17/20 100.6 kg     Exam  General: Alert and oriented x 3, NAD  Cardiovascular: RRR, mechanical S2  Respiratory: Clear to auscultation bilaterally, no wheezing, rales or rhonchi  Gastrointestinal: Soft, nontender, nondistended, + bowel sounds  Ext: no pedal edema bilaterally  Neuro: no new deficits  Musculoskeletal: No digital cyanosis, clubbing  Skin: No rashes  Psych: Normal affect and demeanor, alert and oriented x3    Data Reviewed:  I have personally reviewed following labs and imaging studies  Micro Results Recent Results (from the past 240 hour(s))  Resp Panel by RT-PCR (Flu A&B, Covid) Nasopharyngeal Swab     Status: None   Collection Time: 12/05/20  4:03 PM   Specimen: Nasopharyngeal Swab; Nasopharyngeal(NP) swabs in vial transport medium  Result Value Ref Range Status   SARS Coronavirus 2 by RT PCR NEGATIVE NEGATIVE Final    Comment: (NOTE) SARS-CoV-2 target nucleic acids are NOT DETECTED.  The SARS-CoV-2 RNA is generally detectable in upper respiratory specimens during the acute phase of infection. The lowest concentration of  SARS-CoV-2 viral copies this assay can detect is 138 copies/mL. A negative result does not preclude SARS-Cov-2 infection and should not be used as the sole basis for treatment or other patient management decisions. A negative result may occur with  improper specimen collection/handling, submission of specimen other than nasopharyngeal swab, presence of viral mutation(s) within the areas targeted by this assay, and inadequate number of viral copies(<138 copies/mL). A negative result must be combined with clinical observations, patient history,  and epidemiological information. The expected result is Negative.  Fact Sheet for Patients:  BloggerCourse.com  Fact Sheet for Healthcare Providers:  SeriousBroker.it  This test is no t yet approved or cleared by the [place] FDA and  has been authorized for detection and/or diagnosis of SARS-CoV-2 by FDA under an Emergency Use Authorization (EUA). This EUA will remain  in effect (meaning this test can be used) for the duration of the COVID-19 declaration under Section 564(b)(1) of the Act, 21 U.S.C.section 360bbb-3(b)(1), unless the authorization is terminated  or revoked sooner.       Influenza A by PCR NEGATIVE NEGATIVE Final   Influenza B by PCR NEGATIVE NEGATIVE Final    Comment: (NOTE) The Xpert Xpress SARS-CoV-2/FLU/RSV plus assay is intended as an aid in the diagnosis of influenza from Nasopharyngeal swab specimens and should not be used as a sole basis for treatment. Nasal washings and aspirates are unacceptable for Xpert Xpress SARS-CoV-2/FLU/RSV testing.  Fact Sheet for Patients: BloggerCourse.com  Fact Sheet for Healthcare Providers: SeriousBroker.it  This test is not yet approved or cleared by the [place] FDA and has been authorized for detection and/or diagnosis of SARS-CoV-2 by FDA under an Emergency Use  Authorization (EUA). This EUA will remain in effect (meaning this test can be used) for the duration of the COVID-19 declaration under Section 564(b)(1) of the Act, 21 U.S.C. section 360bbb-3(b)(1), unless the authorization is terminated or revoked.  Performed at [facility]     Radiology Reports DG Chest 2 View  Result Date: 12/05/2020 CLINICAL DATA:  Chest pain EXAM: CHEST - 2 VIEW COMPARISON:  12/02/2018 FINDINGS: Grossly unchanged borderline enlarged cardiac silhouette and mediastinal contours with atherosclerotic plaque when thoracic aorta. Stable position of support apparatus. Sequela of previous median sternotomy and valve repair. Grossly unchanged bilateral infrahilar opacities favored to represent atelectasis. No new focal airspace opacities. No pleural effusion or pneumothorax. No evidence of edema. No acute osseous abnormalities. IMPRESSION: Similar findings of cardiomegaly and bilateral infrahilar atelectasis without post superimposed acute cardiopulmonary disease. Electronically Signed   By: Simonne Come M.D.   On: 12/05/2020 12:53   CT Angio Chest PE W/Cm &/Or Wo Cm  Result Date: 12/05/2020 CLINICAL DATA:  Concern for pulmonary embolism.  High probability EXAM: CT ANGIOGRAPHY CHEST WITH CONTRAST TECHNIQUE: Multidetector CT imaging of the chest was performed using the standard protocol during bolus administration of intravenous contrast. Multiplanar CT image reconstructions and MIPs were obtained to evaluate the vascular anatomy. CONTRAST:  OMNIPAQUE IOHEXOL 350 MG/ML SOLN COMPARISON:  Chest CT 02/20/2020 FINDINGS: Cardiovascular: No filling defects within the pulmonary arteries to suggest acute pulmonary embolism. Post CABG. Mediastinum/Nodes: No axillary or supraclavicular adenopathy. No pericardial fluid. Esophagus normal. 14 mm RIGHT lower paratracheal node. 13 mm subcarinal node. Nodes unchanged from 02/20/2020 Lungs/Pleura: No pulmonary infarction. Subtle  centrilobular nodularity in the lower lobes. No measurable nodule. No pleural fluid or pneumothorax Upper Abdomen: Limited view of the liver, kidneys, pancreas are unremarkable. Normal adrenal glands. Musculoskeletal: No aggressive osseous lesion Review of the MIP images confirms the above findings. IMPRESSION: 1. No evidence acute pulmonary embolism. 2. Fine nodular pattern in the lung bases suggest atypical infection or bronchiolitis. No focal consolidation. 3. Mild mediastinal adenopathy is favored reactive. No change from prior. Electronically Signed   By: Genevive Bi M.D.   On: 12/05/2020 15:29    Lab Data:  CBC: Recent Labs  Lab 12/05/20 1210 12/06/20 0556  WBC 9.7 8.1  HGB 13.0 12.4*  HCT 39.4 36.9*  MCV 91.2 88.5  PLT 169 156   Basic Metabolic Panel: Recent Labs  Lab 12/05/20 1210  NA 137  K 4.3  CL 102  CO2 27  GLUCOSE 161*  BUN 22  CREATININE 1.26*  CALCIUM 8.8*   GFR: Estimated Creatinine Clearance: 61.8 mL/min (A) (by C-G formula based on SCr of 1.26 mg/dL (H)). Liver Function Tests: No results for input(s): AST, ALT, ALKPHOS, BILITOT, PROT, ALBUMIN in the last 168 hours. No results for input(s): LIPASE, AMYLASE in the last 168 hours. No results for input(s): AMMONIA in the last 168 hours. Coagulation Profile: Recent Labs  Lab 12/02/20 1048 12/05/20 1209  INR 1.6* 1.2   Cardiac Enzymes: No results for input(s): CKTOTAL, CKMB, CKMBINDEX, TROPONINI in the last 168 hours. BNP (last 3 results) No results for input(s): PROBNP in the last 8760 hours. HbA1C: Recent Labs    12/05/20 1930  HGBA1C 6.6*   CBG: Recent Labs  Lab 12/05/20 2104 12/06/20 0023 12/06/20 0356 12/06/20 0859 12/06/20 1207  GLUCAP 117* 147* 90 104* 102*   Lipid Profile: No results for input(s): CHOL, HDL, LDLCALC, TRIG, CHOLHDL, LDLDIRECT in the last 72 hours. Thyroid Function Tests: Recent Labs    12/06/20 1026  TSH 8.548*   Anemia Panel: No results for input(s):  VITAMINB12, FOLATE, FERRITIN, TIBC, IRON, RETICCTPCT in the last 72 hours. Urine analysis:    Component Value Date/Time   COLORURINE YELLOW 12/22/2019 1750   APPEARANCEUR CLOUDY (A) 12/22/2019 1750   APPEARANCEUR Clear 05/14/2017 1027   LABSPEC 1.013 12/22/2019 1750   PHURINE 5.0 12/22/2019 1750   GLUCOSEU 50 (A) 12/22/2019 1750   HGBUR LARGE (A) 12/22/2019 1750   BILIRUBINUR NEGATIVE 12/22/2019 1750   BILIRUBINUR Negative 05/14/2017 1027   KETONESUR NEGATIVE 12/22/2019 1750   PROTEINUR 100 (A) 12/22/2019 1750   UROBILINOGEN 0.2 09/28/2014 2234   NITRITE NEGATIVE 12/22/2019 1750   LEUKOCYTESUR NEGATIVE 12/22/2019 1750     Tiffiany Beadles M.D. [facility] 12/06/2020, 1:19 PM  Available via Epic secure chat 7am-7pm After 7 pm, please refer to night coverage provider listed on amion.
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
## 2020-12-06 NOTE — H&P (View-Only) (Signed)
Progress Note  Patient Name: Andre Malone Date of Encounter: 12/06/2020  [facility] Cardiologist: Graciela Husbands   Subjective   70 year old gentleman with a history of coronary artery disease, hypertension, hyperlipidemia, pacemaker for complete heart block.  He has severe aortic stenosis and a ascending thoracic aortic aneurysm.  He is status post placement of his aortic valve as well as his aortic root using a 21 mm [employer] Jude valve conduit by Dr. Laneta Simmers in 2010.  He was admitted with episodes of chest pain.  Chest pain . Tightness lasted for 3-4 hours.  Not aggrivated by twisting / turning/ eating / drinking Did not feel like MSK pain  troponins are negative ,  Has knows disease in his PDA  Lipids have been very high / poorly controlled.  Will schedule him for cath     Inpatient Medications    Scheduled Meds: . acetaminophen  1,000 mg Oral Q6H  . amiodarone  200 mg Oral BID  . clindamycin  300 mg Oral QID  . insulin aspart  0-9 Units Subcutaneous Q4H  . levothyroxine  100 mcg Oral QAC breakfast  . multivitamin with minerals  1 tablet Oral Daily  . pravastatin  40 mg Oral Daily  . sertraline  100 mg Oral Daily  . Warfarin - Pharmacist Dosing Inpatient   Does not apply q1600   Continuous Infusions: . heparin 1,400 Units/hr (12/05/20 2149)   PRN Meds: nitroGLYCERIN, ondansetron (ZOFRAN) IV, oxyCODONE, polyethylene glycol   Vital Signs    Vitals:   12/05/20 1955 12/06/20 0025 12/06/20 0031 12/06/20 0358  BP: (!) 150/80 (!) 160/71  (!) 161/79  Pulse: 63 (!) 59  (!) 59  Resp: 20 19  19   Temp: 98 F (36.7 C) 97.7 F (36.5 C)  97.7 F (36.5 C)  TempSrc: Oral Oral  Oral  SpO2: 98% 95%  98%  Weight:   101.9 kg   Height:        Intake/Output Summary (Last 24 hours) at 12/06/2020 0817 Last data filed at 12/06/2020 0600 Gross per 24 hour  Intake 533.53 ml  Output 1000 ml  Net -466.47 ml   Last 3 Weights 12/06/2020 12/05/2020 12/05/2020  Weight (lbs) 224 lb 11.2 oz 227 lb  1.6 oz 230 lb  Weight (kg) 101.923 kg 103.012 kg 104.327 kg      Telemetry     NSR - Personally Reviewed  ECG    nsr  - Personally Reviewed  Physical Exam   GEN: middle age male,  NAD .   Neck: No JVD Cardiac: RRR, mechanical S2.  Respiratory: Clear to auscultation bilaterally. GI: Soft, nontender, non-distended  MS: No edema; No deformity. Neuro:  Nonfocal  Psych: Normal affect   Labs    High Sensitivity Troponin:   Recent Labs  Lab 12/05/20 1210 12/05/20 1439  TROPONINIHS 11 10      Chemistry Recent Labs  Lab 12/05/20 1210  NA 137  K 4.3  CL 102  CO2 27  GLUCOSE 161*  BUN 22  CREATININE 1.26*  CALCIUM 8.8*  GFRNONAA >60  ANIONGAP 8     Hematology Recent Labs  Lab 12/05/20 1210 12/06/20 0556  WBC 9.7 8.1  RBC 4.32 4.17*  HGB 13.0 12.4*  HCT 39.4 36.9*  MCV 91.2 88.5  MCH 30.1 29.7  MCHC 33.0 33.6  RDW 14.7 14.6  PLT 169 156    BNP Recent Labs  Lab 12/05/20 1439  BNP 144.3*     DDimer No results  for input(s): DDIMER in the last 168 hours.   Radiology    DG Chest 2 View  Result Date: 12/05/2020 CLINICAL DATA:  Chest pain EXAM: CHEST - 2 VIEW COMPARISON:  12/02/2018 FINDINGS: Grossly unchanged borderline enlarged cardiac silhouette and mediastinal contours with atherosclerotic plaque when thoracic aorta. Stable position of support apparatus. Sequela of previous median sternotomy and valve repair. Grossly unchanged bilateral infrahilar opacities favored to represent atelectasis. No new focal airspace opacities. No pleural effusion or pneumothorax. No evidence of edema. No acute osseous abnormalities. IMPRESSION: Similar findings of cardiomegaly and bilateral infrahilar atelectasis without post superimposed acute cardiopulmonary disease. Electronically Signed   By: Simonne Come M.D.   On: 12/05/2020 12:53   CT Angio Chest PE W/Cm &/Or Wo Cm  Result Date: 12/05/2020 CLINICAL DATA:  Concern for pulmonary embolism.  High probability EXAM: CT  ANGIOGRAPHY CHEST WITH CONTRAST TECHNIQUE: Multidetector CT imaging of the chest was performed using the standard protocol during bolus administration of intravenous contrast. Multiplanar CT image reconstructions and MIPs were obtained to evaluate the vascular anatomy. CONTRAST:  OMNIPAQUE IOHEXOL 350 MG/ML SOLN COMPARISON:  Chest CT 02/20/2020 FINDINGS: Cardiovascular: No filling defects within the pulmonary arteries to suggest acute pulmonary embolism. Post CABG. Mediastinum/Nodes: No axillary or supraclavicular adenopathy. No pericardial fluid. Esophagus normal. 14 mm RIGHT lower paratracheal node. 13 mm subcarinal node. Nodes unchanged from 02/20/2020 Lungs/Pleura: No pulmonary infarction. Subtle centrilobular nodularity in the lower lobes. No measurable nodule. No pleural fluid or pneumothorax Upper Abdomen: Limited view of the liver, kidneys, pancreas are unremarkable. Normal adrenal glands. Musculoskeletal: No aggressive osseous lesion Review of the MIP images confirms the above findings. IMPRESSION: 1. No evidence acute pulmonary embolism. 2. Fine nodular pattern in the lung bases suggest atypical infection or bronchiolitis. No focal consolidation. 3. Mild mediastinal adenopathy is favored reactive. No change from prior. Electronically Signed   By: Genevive Bi M.D.   On: 12/05/2020 15:29    Cardiac Studies      Patient Profile     70 y.o. male with AS, s/p AVR and aortic root replacement , admitted with chest pain   Assessment & Plan    1.  Chest pain: Azad presents with episodes of chest pain.  The episode lasted 3 to 4 hours.  The troponins were negative.  The pain was not associated with deep breath, eating, drinking, change of position.  It was not pleuritic.  He has known coronary artery disease.  His lipids have been very poorly controlled.  His last lipid panel from November, 2021 showed an LDL of 193.  His triglyceride level is 219.  Total cholesterol 273.  I think her  best option is to proceed with heart catheterization. We reviewed the risks, benefits, options of heart catheterization.  He understands and agrees to proceed.  2.  Hyperlipidemia:  He is on pravastatin .  Has statin intolerance apparently .  He will need to be referred to lipid clinic for consideration for PCSK9 inhibitor .  Will put in for a lipid consult .   3.  S/p AVR:   His INR is now close to normal.  He is currently on heparin.  We will restart warfarin following his heart catheterization.  4.  Hypothyroidism:   Check TSH today .   Cont meds.  Further plans per IM       For questions or updates, please contact [facility] Please consult [email or web address] for contact info under  Signed, Kristeen Miss, MD  12/06/2020, 8:17 AM

## 2020-12-06 NOTE — Progress Notes (Signed)
Kept pt on NPO, pt on sever pain given with oxycodone with sip of water

## 2020-12-06 NOTE — Interval H&P Note (Signed)
Cath Lab Visit (complete for each Cath Lab visit)  Clinical Evaluation Leading to the Procedure:   ACS: Yes.    Non-ACS:    Anginal Classification: CCS II  Anti-ischemic medical therapy: Minimal Therapy (1 class of medications)  Non-Invasive Test Results: No non-invasive testing performed  Prior CABG: No previous CABG      History and Physical Interval Note:  12/06/2020 3:29 PM  Andre Malone  has presented today for surgery, with the diagnosis of chest pain.  The various methods of treatment have been discussed with the patient and family. After consideration of risks, benefits and other options for treatment, the patient has consented to  Procedure(s): LEFT HEART CATH AND CORONARY ANGIOGRAPHY (N/A) as a surgical intervention.  The patient's history has been reviewed, patient examined, no change in status, stable for surgery.  I have reviewed the patient's chart and labs.  Questions were answered to the patient's satisfaction.     Andre Malone

## 2020-12-06 NOTE — Plan of Care (Signed)
  Problem: Education: Goal: Knowledge of General Education information will improve Description: Including pain rating scale, medication(s)/side effects and non-pharmacologic comfort measures Outcome: Progressing   Problem: Health Behavior/Discharge Planning: Goal: Ability to manage health-related needs will improve Outcome: Progressing   Problem: Clinical Measurements: Goal: Ability to maintain clinical measurements within normal limits will improve Outcome: Progressing Goal: Will remain free from infection Outcome: Progressing Goal: Diagnostic test results will improve Outcome: Progressing Goal: Respiratory complications will improve Outcome: Progressing Goal: Cardiovascular complication will be avoided Outcome: Progressing   Problem: Nutrition: Goal: Adequate nutrition will be maintained Outcome: Progressing   Problem: Elimination: Goal: Will not experience complications related to bowel motility Outcome: Progressing Goal: Will not experience complications related to urinary retention Outcome: Progressing   Problem: Pain Managment: Goal: General experience of comfort will improve Outcome: Progressing   Problem: Safety: Goal: Ability to remain free from injury will improve Outcome: Progressing   Problem: Skin Integrity: Goal: Risk for impaired skin integrity will decrease Outcome: Progressing   Problem: Education: Goal: Ability to demonstrate management of disease process will improve Outcome: Progressing Goal: Ability to verbalize understanding of medication therapies will improve Outcome: Progressing Goal: Individualized Educational Video(s) Outcome: Progressing   Problem: Activity: Goal: Capacity to carry out activities will improve Outcome: Progressing   Problem: Cardiac: Goal: Ability to achieve and maintain adequate cardiopulmonary perfusion will improve Outcome: Progressing   Problem: Education: Goal: Understanding of CV disease, CV risk reduction,  and recovery process will improve Outcome: Progressing   Problem: Activity: Goal: Ability to return to baseline activity level will improve Outcome: Progressing   Problem: Cardiovascular: Goal: Ability to achieve and maintain adequate cardiovascular perfusion will improve Outcome: Progressing Goal: Vascular access site(s) Level 0-1 will be maintained Outcome: Progressing   Problem: Health Behavior/Discharge Planning: Goal: Ability to safely manage health-related needs after discharge will improve Outcome: Progressing   

## 2020-12-06 NOTE — Progress Notes (Signed)
ANTICOAGULATION CONSULT NOTE - Follow Up Consult  Pharmacy Consult for heparin Indication: AVR/PAF  Labs: Recent Labs    12/05/20 1209 12/05/20 1210 12/05/20 1439 12/06/20 0556  HGB  --  13.0  --  12.4*  HCT  --  39.4  --  36.9*  PLT  --  169  --  156  LABPROT 14.8  --   --   --   INR 1.2  --   --   --   HEPARINUNFRC  --   --   --  0.52  CREATININE  --  1.26*  --   --   TROPONINIHS  --  11 10  --     Assessment/Plan:  70yo male therapeutic on heparin with initial dosing while INR below goal. Will continue gtt at current rate of 1400 units/hr and confirm stable with additional level.   Vernard Gambles, PharmD, BCPS  12/06/2020,6:28 AM

## 2020-12-07 ENCOUNTER — Encounter: Payer: Self-pay | Admitting: Internal Medicine

## 2020-12-07 ENCOUNTER — Inpatient Hospital Stay (HOSPITAL_COMMUNITY): Payer: Medicare HMO

## 2020-12-07 ENCOUNTER — Other Ambulatory Visit (HOSPITAL_COMMUNITY): Payer: Self-pay

## 2020-12-07 ENCOUNTER — Encounter (HOSPITAL_COMMUNITY): Payer: Self-pay | Admitting: Cardiovascular Disease

## 2020-12-07 DIAGNOSIS — I2510 Atherosclerotic heart disease of native coronary artery without angina pectoris: Secondary | ICD-10-CM

## 2020-12-07 DIAGNOSIS — Z955 Presence of coronary angioplasty implant and graft: Secondary | ICD-10-CM

## 2020-12-07 DIAGNOSIS — I200 Unstable angina: Secondary | ICD-10-CM | POA: Diagnosis not present

## 2020-12-07 DIAGNOSIS — Z952 Presence of prosthetic heart valve: Secondary | ICD-10-CM | POA: Diagnosis not present

## 2020-12-07 DIAGNOSIS — I480 Paroxysmal atrial fibrillation: Secondary | ICD-10-CM | POA: Diagnosis not present

## 2020-12-07 LAB — BASIC METABOLIC PANEL
Anion gap: 6 (ref 5–15)
BUN: 15 mg/dL (ref 8–23)
CO2: 24 mmol/L (ref 22–32)
Calcium: 8.7 mg/dL — ABNORMAL LOW (ref 8.9–10.3)
Chloride: 105 mmol/L (ref 98–111)
Creatinine, Ser: 1.21 mg/dL (ref 0.61–1.24)
GFR, Estimated: >60 mL/min (ref >60)
Glucose, Bld: 112 mg/dL — ABNORMAL HIGH (ref 70–99)
Potassium: 4.3 mmol/L (ref 3.5–5.1)
Sodium: 135 mmol/L (ref 135–145)

## 2020-12-07 LAB — CBC
HCT: 38.5 % — ABNORMAL LOW (ref 39.0–52.0)
Hemoglobin: 12.7 g/dL — ABNORMAL LOW (ref 13.0–17.0)
MCH: 29.7 pg (ref 26.0–34.0)
MCHC: 33 g/dL (ref 30.0–36.0)
MCV: 90 fL (ref 80.0–100.0)
Platelets: 187 10*3/uL (ref 150–400)
RBC: 4.28 MIL/uL (ref 4.22–5.81)
RDW: 14.7 % (ref 11.5–15.5)
WBC: 8.6 10*3/uL (ref 4.0–10.5)
nRBC: 0 % (ref 0.0–0.2)

## 2020-12-07 LAB — ECHOCARDIOGRAM COMPLETE
AR max vel: 0.68 cm2
AV Area VTI: 0.75 cm2
AV Area mean vel: 0.73 cm2
AV Mean grad: 28 mmHg
AV Peak grad: 51.8 mmHg
Ao pk vel: 3.6 m/s
Area-P 1/2: 2.69 cm2
Height: 66 in
S' Lateral: 3.3 cm
Weight: 3358.4 oz

## 2020-12-07 LAB — HEPATIC FUNCTION PANEL
ALT: 14 U/L (ref 0–44)
AST: 25 U/L (ref 15–41)
Albumin: 3.3 g/dL — ABNORMAL LOW (ref 3.5–5.0)
Alkaline Phosphatase: 53 U/L (ref 38–126)
Bilirubin, Direct: <0.1 mg/dL (ref 0.0–0.2)
Total Bilirubin: 0.6 mg/dL (ref 0.3–1.2)
Total Protein: 7 g/dL (ref 6.5–8.1)

## 2020-12-07 LAB — GLUCOSE, CAPILLARY
Glucose-Capillary: 90 mg/dL (ref 70–99)
Glucose-Capillary: 101 mg/dL — ABNORMAL HIGH (ref 70–99)
Glucose-Capillary: 109 mg/dL — ABNORMAL HIGH (ref 70–99)

## 2020-12-07 LAB — T4, FREE: Free T4: 1.05 ng/dL (ref 0.61–1.12)

## 2020-12-07 LAB — PROTIME-INR
INR: 1.5 — ABNORMAL HIGH (ref 0.8–1.2)
Prothrombin Time: 17.5 seconds — ABNORMAL HIGH (ref 11.4–15.2)

## 2020-12-07 MED ORDER — CLOPIDOGREL BISULFATE 75 MG PO TABS
75.0000 mg | ORAL_TABLET | Freq: Every day | ORAL | Status: DC
  Administered 2020-12-07: 75 mg via ORAL
  Filled 2020-12-07: qty 1

## 2020-12-07 MED ORDER — HYDRALAZINE HCL 20 MG/ML IJ SOLN: 10.0000 mg | INTRAMUSCULAR | Status: AC | PRN

## 2020-12-07 MED ORDER — ENOXAPARIN SODIUM 100 MG/ML ~~LOC~~ SOLN
100.0000 mg | Freq: Once | SUBCUTANEOUS | Status: AC
  Administered 2020-12-07: 100 mg via SUBCUTANEOUS
  Filled 2020-12-07: qty 1

## 2020-12-07 MED ORDER — ENOXAPARIN SODIUM 100 MG/ML ~~LOC~~ SOLN: 100.0000 mg | Freq: Two times a day (BID) | SUBCUTANEOUS | Status: DC

## 2020-12-07 MED ORDER — LABETALOL HCL 5 MG/ML IV SOLN: 10.0000 mg | INTRAVENOUS | Status: AC | PRN

## 2020-12-07 MED ORDER — PERFLUTREN LIPID MICROSPHERE
1.0000 mL | INTRAVENOUS | Status: DC | PRN
  Administered 2020-12-07: 2 mL via INTRAVENOUS
  Filled 2020-12-07: qty 10

## 2020-12-07 MED ORDER — SODIUM CHLORIDE 0.9% FLUSH: 3.0000 mL | INTRAVENOUS | Status: DC | PRN

## 2020-12-07 MED ORDER — ACETAMINOPHEN 325 MG PO TABS: 650.0000 mg | ORAL_TABLET | ORAL | Status: DC | PRN

## 2020-12-07 MED ORDER — ENOXAPARIN SODIUM 100 MG/ML ~~LOC~~ SOLN
100.0000 mg | Freq: Two times a day (BID) | SUBCUTANEOUS | 0 refills | Status: DC
  Filled 2020-12-07: qty 20, 10d supply, fill #0

## 2020-12-07 MED ORDER — ATORVASTATIN CALCIUM 80 MG PO TABS
80.0000 mg | ORAL_TABLET | Freq: Every day | ORAL | 3 refills | Status: DC
  Filled 2020-12-07: qty 30, 30d supply, fill #0

## 2020-12-07 MED ORDER — ASPIRIN 81 MG PO TBEC
81.0000 mg | DELAYED_RELEASE_TABLET | Freq: Every day | ORAL | 0 refills | Status: AC
  Filled 2020-12-07: qty 30, 30d supply, fill #0

## 2020-12-07 MED ORDER — NITROGLYCERIN 0.4 MG SL SUBL
0.4000 mg | SUBLINGUAL_TABLET | SUBLINGUAL | 12 refills | Status: AC | PRN
  Filled 2020-12-07: qty 25, 7d supply, fill #0

## 2020-12-07 MED ORDER — CLOPIDOGREL BISULFATE 75 MG PO TABS
75.0000 mg | ORAL_TABLET | Freq: Every day | ORAL | 3 refills | Status: DC
  Filled 2020-12-07: qty 30, 30d supply, fill #0

## 2020-12-07 MED ORDER — ASPIRIN 81 MG PO CHEW
81.0000 mg | CHEWABLE_TABLET | Freq: Every day | ORAL | Status: DC
  Administered 2020-12-07: 81 mg via ORAL
  Filled 2020-12-07: qty 1

## 2020-12-07 MED ORDER — SODIUM CHLORIDE 0.9% FLUSH
3.0000 mL | Freq: Two times a day (BID) | INTRAVENOUS | Status: DC
  Administered 2020-12-07: 3 mL via INTRAVENOUS

## 2020-12-07 MED ORDER — HEPARIN (PORCINE) 25000 UT/250ML-% IV SOLN
1400.0000 [IU]/h | INTRAVENOUS | Status: DC
  Administered 2020-12-07: 1400 [IU]/h via INTRAVENOUS
  Filled 2020-12-07: qty 250

## 2020-12-07 MED ORDER — WARFARIN SODIUM 7.5 MG PO TABS: 7.5000 mg | ORAL_TABLET | Freq: Once | ORAL | Status: DC

## 2020-12-07 MED ORDER — ATORVASTATIN CALCIUM 80 MG PO TABS
80.0000 mg | ORAL_TABLET | Freq: Every day | ORAL | Status: DC
  Administered 2020-12-07: 80 mg via ORAL
  Filled 2020-12-07: qty 1

## 2020-12-07 MED ORDER — SODIUM CHLORIDE 0.9 % IV SOLN: 250.0000 mL | INTRAVENOUS | Status: DC | PRN

## 2020-12-07 MED FILL — Nitroglycerin IV Soln 100 MCG/ML in D5W: INTRA_ARTERIAL | Qty: 10 | Status: AC

## 2020-12-07 MED FILL — Verapamil HCl IV Soln 2.5 MG/ML: INTRAVENOUS | Qty: 2 | Status: AC
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
## 2020-12-07 NOTE — Progress Notes (Signed)
  Echocardiogram 2D Echocardiogram has been performed.  Andre Malone F 12/07/2020, 3:25 PM

## 2020-12-07 NOTE — Progress Notes (Signed)
CARDIAC REHAB PHASE I   PRE:  Rate/Rhythm: 60 pacing    BP: sitting 131/57    SaO2: 96 RA  MODE:  Ambulation: 470 ft   POST:  Rate/Rhythm: 92 pacing    BP: sitting 167/61     SaO2: 94 RA  Tolerated well, no c/o. He is overwhelmed with his medical situation as he lost his wife recently. Discussed education and focusing on exercise for physical and mental health. Discussed stents, restrictions, Plavix/ASA, diet, exercise, NTG, and CRPII. Pt receptive. Will refer to [facility] CRPII.  [id number]   Harriet Masson CES, ACSM 12/07/2020 10:24 AM

## 2020-12-07 NOTE — Plan of Care (Signed)
  Problem: Education: Goal: Knowledge of General Education information will improve Description: Including pain rating scale, medication(s)/side effects and non-pharmacologic comfort measures Outcome: Progressing   Problem: Health Behavior/Discharge Planning: Goal: Ability to manage health-related needs will improve Outcome: Progressing   Problem: Clinical Measurements: Goal: Ability to maintain clinical measurements within normal limits will improve Outcome: Progressing Goal: Will remain free from infection Outcome: Progressing Goal: Diagnostic test results will improve Outcome: Progressing Goal: Respiratory complications will improve Outcome: Progressing Goal: Cardiovascular complication will be avoided Outcome: Progressing   Problem: Nutrition: Goal: Adequate nutrition will be maintained Outcome: Progressing   Problem: Elimination: Goal: Will not experience complications related to bowel motility Outcome: Progressing Goal: Will not experience complications related to urinary retention Outcome: Progressing   Problem: Pain Managment: Goal: General experience of comfort will improve Outcome: Progressing   Problem: Safety: Goal: Ability to remain free from injury will improve Outcome: Progressing   Problem: Skin Integrity: Goal: Risk for impaired skin integrity will decrease Outcome: Progressing   Problem: Education: Goal: Ability to demonstrate management of disease process will improve Outcome: Progressing Goal: Ability to verbalize understanding of medication therapies will improve Outcome: Progressing Goal: Individualized Educational Video(s) Outcome: Progressing   Problem: Activity: Goal: Capacity to carry out activities will improve Outcome: Progressing   Problem: Cardiac: Goal: Ability to achieve and maintain adequate cardiopulmonary perfusion will improve Outcome: Progressing   Problem: Education: Goal: Understanding of CV disease, CV risk reduction,  and recovery process will improve Outcome: Progressing   Problem: Activity: Goal: Ability to return to baseline activity level will improve Outcome: Progressing   Problem: Cardiovascular: Goal: Ability to achieve and maintain adequate cardiovascular perfusion will improve Outcome: Progressing Goal: Vascular access site(s) Level 0-1 will be maintained Outcome: Progressing   Problem: Health Behavior/Discharge Planning: Goal: Ability to safely manage health-related needs after discharge will improve Outcome: Progressing

## 2020-12-07 NOTE — Progress Notes (Signed)
Progress Note  Patient Name: Andre Malone Date of Encounter: 12/07/2020  [facility] Cardiologist: Graciela HusbandsKlein   Subjective   70 year old gentleman with a history of coronary artery disease, hypertension, hyperlipidemia, pacemaker for complete heart block.  He has severe aortic stenosis and a ascending thoracic aortic aneurysm.  He is status post placement of his aortic valve as well as his aortic root using a 21 mm Saint Jude valve conduit by Dr. Laneta SimmersBartle in 2010.  He was admitted with episodes of chest pain.  Patient had a heart catheterization yesterday which revealed a tight mid right coronary artery stenosis and a proximal circumflex stenosis.  He had stents placed in his RCA and left circumflex artery.  He is feeling better today.  He has hyperlipidemia.  He is on Pravachol.  He will need to be started on a stronger statin ( will start with Atorvastatin 80 mg a day )  will be considered for PCSK9 inhibitor.  He is on coumadin for his mechanical AVR.   INR is 1.5 today   He is okay to be discharged from my standpoint.  He will need Lovenox for the next 2 to 3 days to cover him until his INR is 2.0.  [facility]  He will need to have his INR checked this Friday    Inpatient Medications    Scheduled Meds: . acetaminophen  1,000 mg Oral Q6H  . amLODipine  2.5 mg Oral Daily  . aspirin  81 mg Oral Daily  . clindamycin  300 mg Oral QID  . clopidogrel  75 mg Oral Q breakfast  . insulin aspart  0-9 Units Subcutaneous Q4H  . levothyroxine  100 mcg Oral QAC breakfast  . multivitamin with minerals  1 tablet Oral Daily  . pravastatin  40 mg Oral Daily  . sertraline  100 mg Oral Daily  . sodium chloride flush  3 mL Intravenous Q12H  . warfarin  7.5 mg Oral ONCE-1600  . Warfarin - Pharmacist Dosing Inpatient   Does not apply q1600   Continuous Infusions: . sodium chloride    . heparin 1,400 Units/hr (12/07/20 0725)   PRN  Meds: sodium chloride, acetaminophen, morphine injection, nitroGLYCERIN, ondansetron (ZOFRAN) IV, ondansetron (ZOFRAN) IV, oxyCODONE, polyethylene glycol, sodium chloride, sodium chloride flush, traZODone   Vital Signs    Vitals:   12/06/20 2102 12/07/20 0356 12/07/20 0616 12/07/20 0756  BP: 139/73 137/72  (!) 148/64  Pulse: 60 62  63  Resp: 15 18  12   Temp: 98.4 F (36.9 C) 98.4 F (36.9 C)  98.4 F (36.9 C)  TempSrc: Axillary Axillary  Oral  SpO2: 99% 96%  97%  Weight:   95.2 kg   Height:        Intake/Output Summary (Last 24 hours) at 12/07/2020 0935 Last data filed at 12/07/2020 0900 Gross per 24 hour  Intake 723.17 ml  Output 1550 ml  Net -826.83 ml   Last 3 Weights 12/07/2020 12/06/2020 12/05/2020  Weight (lbs) 209 lb 14.4 oz 224 lb 11.2 oz 227 lb 1.6 oz  Weight (kg) 95.21 kg 101.923 kg 103.012 kg      Telemetry     NSR - Personally Reviewed  ECG    nsr  - Personally Reviewed  Physical Exam   Physical Exam: Blood pressure (!) 148/64, pulse 63, temperature 98.4 F (36.9 C), temperature source Oral, resp. rate 12, height 5\' 6"  (1.676 m), weight 95.2 kg, SpO2 97 %.  GEN:   Middle-aged gentleman, no acute distress HEENT: Normal NECK: No JVD; No carotid bruits LYMPHATICS: No lymphadenopathy CARDIAC: RRR regular rate S1, mechanical S2 RESPIRATORY:  Clear to auscultation without rales, wheezing or rhonchi  ABDOMEN: Soft, non-tender, non-distended MUSCULOSKELETAL:  No edema; No deformity right femoral cath site looks okay.  No hematoma. SKIN: Warm and dry NEUROLOGIC:  Alert and oriented x 3   Labs    High Sensitivity Troponin:   Recent Labs  Lab 12/05/20 1210 12/05/20 1439  TROPONINIHS 11 10      Chemistry Recent Labs  Lab 12/05/20 1210 12/07/20 0244  NA 137 135  K 4.3 4.3  CL 102 105  CO2 27 24  GLUCOSE 161* 112*  BUN 22 15  CREATININE 1.26* 1.21  CALCIUM 8.8* 8.7*  PROT  --  7.0  ALBUMIN  --  3.3*  AST  --  25  ALT  --  14  ALKPHOS  --  53   BILITOT  --  0.6  GFRNONAA >60 >60  ANIONGAP 8 6     Hematology Recent Labs  Lab 12/05/20 1210 12/06/20 0556 12/07/20 0244  WBC 9.7 8.1 8.6  RBC 4.32 4.17* 4.28  HGB 13.0 12.4* 12.7*  HCT 39.4 36.9* 38.5*  MCV 91.2 88.5 90.0  MCH 30.1 29.7 29.7  MCHC 33.0 33.6 33.0  RDW 14.7 14.6 14.7  PLT 169 156 187    BNP Recent Labs  Lab 12/05/20 1439  BNP 144.3*     DDimer No results for input(s): DDIMER in the last 168 hours.   Radiology    DG Chest 2 View  Result Date: 12/05/2020 CLINICAL DATA:  Chest pain EXAM: CHEST - 2 VIEW COMPARISON:  12/02/2018 FINDINGS: Grossly unchanged borderline enlarged cardiac silhouette and mediastinal contours with atherosclerotic plaque when thoracic aorta. Stable position of support apparatus. Sequela of previous median sternotomy and valve repair. Grossly unchanged bilateral infrahilar opacities favored to represent atelectasis. No new focal airspace opacities. No pleural effusion or pneumothorax. No evidence of edema. No acute osseous abnormalities. IMPRESSION: Similar findings of cardiomegaly and bilateral infrahilar atelectasis without post superimposed acute cardiopulmonary disease. Electronically Signed   By: Simonne Come M.D.   On: 12/05/2020 12:53   CT Angio Chest PE W/Cm &/Or Wo Cm  Result Date: 12/05/2020 CLINICAL DATA:  Concern for pulmonary embolism.  High probability EXAM: CT ANGIOGRAPHY CHEST WITH CONTRAST TECHNIQUE: Multidetector CT imaging of the chest was performed using the standard protocol during bolus administration of intravenous contrast. Multiplanar CT image reconstructions and MIPs were obtained to evaluate the vascular anatomy. CONTRAST:  OMNIPAQUE IOHEXOL 350 MG/ML SOLN COMPARISON:  Chest CT 02/20/2020 FINDINGS: Cardiovascular: No filling defects within the pulmonary arteries to suggest acute pulmonary embolism. Post CABG. Mediastinum/Nodes: No axillary or supraclavicular adenopathy. No pericardial fluid. Esophagus normal.  14 mm RIGHT lower paratracheal node. 13 mm subcarinal node. Nodes unchanged from 02/20/2020 Lungs/Pleura: No pulmonary infarction. Subtle centrilobular nodularity in the lower lobes. No measurable nodule. No pleural fluid or pneumothorax Upper Abdomen: Limited view of the liver, kidneys, pancreas are unremarkable. Normal adrenal glands. Musculoskeletal: No aggressive osseous lesion Review of the MIP images confirms the above findings. IMPRESSION: 1. No evidence acute pulmonary embolism. 2. Fine nodular pattern in the lung bases suggest atypical infection or bronchiolitis. No focal consolidation. 3. Mild mediastinal adenopathy is favored reactive. No change from prior. Electronically Signed   By: Genevive Bi M.D.   On: 12/05/2020 15:29   CARDIAC CATHETERIZATION  Result Date:  12/06/2020  RPDA lesion is 80% stenosed.  Prox RCA to Mid RCA lesion is 80% stenosed.  1st Diag lesion is 75% stenosed.  Prox Cx to Mid Cx lesion is 90% stenosed.  Prox Cx lesion is 50% stenosed.  A drug-eluting stent was successfully placed using a STENT RESOLUTE ONYX 3.0X18.  Post intervention, there is a 0% residual stenosis.  A drug-eluting stent was successfully placed using a STENT RESOLUTE ONYX 3.0X18.  Post intervention, there is a 0% residual stenosis.  Post intervention, there is a 0% residual stenosis.  MUREL SHENBERGER is a 70 y.o. male  [id number] LOCATION:  FACILITY: [facility] PHYSICIAN: [name], M.D. Jan 19, 1951 DATE OF PROCEDURE:  12/06/2020 DATE OF DISCHARGE: CARDIAC CATHETERIZATION / PCI DES RCA & LCX History obtained from chart review.70 y.o. male with AS, s/p AVR and aortic root replacement , admitted with chest pain  his enzymes were negative.  His EKG is paced.  His last cath in 2014 showed a mid PDA lesion not thought to be amenable to PCI.  He presents now for cardiac catheterization. PROCEDURE DESCRIPTION: The patient was brought to the second floor [place] [facility] cath lab in the postabsorptive state.  He was premedicated with IV Versed and fentanyl. His right groinwas prepped and shaved in usual sterile fashion. Xylocaine 1% was used for local anesthesia. A 5 French sheath was inserted into the right common femoral  artery using standard Seldinger technique.  5 French right left second Silastic catheters were used for selective coronary angiography.  The aortic valve was not crossed since it was mechanical valve.  Isovue dye is used for the entirety of the case.  Retrograde aortic pressures monitored in the case. The patient received 10,000 units of heparin with an ACT of 279.  Isovue dye was used for the entirety of the case.  Retroaortic pressures monitored in the case.  Patient did receive 600 mg of p.o. Plavix and 20 mg of IV Pepcid. I first addressed the RCA.  Using a 6 [place] guide catheter along with a 0.14 [place] guidewire and a 2 mm x 12 mm balloon the mid RCA lesion was crossed and predilated.  I then placed a 3 mm x 18 mm long Synergy drug-eluting stent and deployed at nominal pressures.  I postdilated with a 3.25 mm x 12 mm long balloon resulting reduction of an 80 to 90% ulcerated appearing mid dominant RCA stenosis to 0% residual. I then focused my attention on the circumflex.  Using a 6 [place] XB 3.5 cm guide catheter along with the same wire and predilatation balloon across the mid AV groove circumflex and predilated.  I then placed a 3 mm x 24 mm long Medtronic Onyx resolute drug-eluting stent deployed across a small marginal branch and postdilated with a 3.25 x 15 mm long noncompliant balloon at nominal pressures resulting reduction of a 90% fairly focal mid AV groove circumflex stenosis just after small marginal branch with 0% residual.  There was mild encroachment on the ostium of the marginal but this was nonflow limiting.   Successful PCI and drug-eluting stenting of a mid dominant RCA as well as mid AV groove circumflex in the setting of unstable angina.  He is on aspirin Plavix  currently.  He is on Coumadin for his valve and therefore will need "triple therapy" for 1 month after which aspirin can be discontinued.  He will need Plavix for at least 1 year uninterrupted.  The sheath was sewn securely in place.  The patient left lab in stable condition.  Once ACT falls below 170 the sheath will be removed and pressure held in the Cath Lab. [name]. MD, [facility] 12/06/2020 4:57 PM    Cardiac Studies      Patient Profile     69 y.o. male with AS, s/p AVR and aortic root replacement , admitted with chest pain   Assessment & Plan    1.  Coronary artery disease:  Heart catheterization yesterday revealed a very tight mid right coronary stenosis and a tight proximal left circumflex artery that were stented.  He is done well. He will need to be discharged on aspirin, 81 mg a day, Plavix 75 mg a day, warfarin home dose, Lovenox ( dose and duration  per pharmacy)  Plan is to stop the ASA after 1 month and continue Plavix and warfarin  Follow up with Dr. Clifton James or APP in a month or so   2.  Hyperlipidemia:  We have changed his pravastatin to atorvastatin.  3.  S/p AVR:   His INR is now close to normal.  He is currently on heparin.  We will send him home on Lovenox as well as his warfarin.  All ask pharmacy to help [place] with that dosing and duration of Lovenox and warfarin loading .   4.  Hypothyroidism:        For questions or updates, please contact [facility] Please consult [email or web address] for contact info under        Signed, Kristeen Miss, MD  12/07/2020, 9:35 AM
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
## 2020-12-07 NOTE — Discharge Summary (Signed)
Physician Discharge Summary   Patient ID: Andre Malone MRN: [id number] DOB/AGE: Aug 26, 1951 70 y.o.  Admit date: 12/05/2020 Discharge date: 12/07/2020  Primary Care Physician:  Bennie PieriniMartin, [name], FNP   Recommendations for Outpatient Follow-up:  1. Follow up with PCP in 1-2 weeks 2. Patient was discharged on Lovenox bridge with Coumadin. Recommended to follow-up on the INR this Friday on 12/10/2020, follow instructions from PCP or cardiology when to discontinue Lovenox. 3. Started on Lipitor 80 mg daily 4. Plavix 75 mg daily with aspirin 5. TSH 8.5, T4 normal 1.05, please follow and adjust Synthroid dose  Home Health: None Equipment/Devices:   Discharge Condition: stable  CODE STATUS: FULL  Diet recommendation: Heart healthy diet   Discharge Diagnoses:    . Atypical chest pain/unstable angina  Known history of coronary artery disease with significant PDA lesion . Paroxysmal atrial fibrillation (HCC) . Diabetes mellitus type 2 with diabetic neuropathy, IDDM Hypothyroidism Hyperlipidemia Anxiety Obesity   Consults: Cardiology    Allergies:   Allergies  Allergen Reactions  . Apresoline [Hydralazine] Nausea And Vomiting  . Ace Inhibitors Other (See Comments)    cough  . Benadryl [Diphenhydramine Hcl] Other (See Comments)    Makes restless legs worse  . Imdur [Isosorbide Dinitrate] Other (See Comments)    headache  . Metoprolol Other (See Comments)    Kidney failure  . Nsaids Other (See Comments)    REACTION: Currently taking Coumadin  . Valsartan Other (See Comments)    REACTION:shuts down Kidney     DISCHARGE MEDICATIONS: Allergies as of 12/07/2020      Reactions   Apresoline [hydralazine] Nausea And Vomiting   Ace Inhibitors Other (See Comments)   cough   Benadryl [diphenhydramine Hcl] Other (See Comments)   Makes restless legs worse   Imdur [isosorbide Dinitrate] Other (See Comments)   headache   Metoprolol Other (See Comments)   Kidney failure    Nsaids Other (See Comments)   REACTION: Currently taking Coumadin   Valsartan Other (See Comments)   REACTION:shuts down Kidney      Medication List    STOP taking these medications   pravastatin 40 MG tablet Commonly known as: PRAVACHOL     TAKE these medications   acetaminophen 500 MG tablet Commonly known as: TYLENOL Take 2 tablets (1,000 mg total) by mouth every 6 (six) hours.   amLODipine 5 MG tablet Commonly known as: NORVASC Take 0.5 tablets (2.5 mg total) by mouth daily.   Aspirin Low Dose 81 MG EC tablet Generic drug: aspirin Take 1 tablet (81 mg total) by mouth daily.   atorvastatin 80 MG tablet Commonly known as: LIPITOR Take 1 tablet (80 mg total) by mouth daily. Start taking on: December 08, 2020   clindamycin 300 MG capsule Commonly known as: Cleocin Take 1 capsule (300 mg total) by mouth 4 (four) times daily.   clopidogrel 75 MG tablet Commonly known as: PLAVIX Take 1 tablet (75 mg total) by mouth daily with breakfast. Start taking on: December 08, 2020   enoxaparin 100 MG/ML injection Commonly known as: LOVENOX Inject 1 syringe (100 mg total) into the skin every 12 (twelve) hours for 10 days.   furosemide 40 MG tablet Commonly known as: LASIX Take 1 tablet (40 mg total) by mouth daily as needed for edema.   lactase 3000 units tablet Commonly known as: LACTAID Take 9,000 Units by mouth daily as needed (when consuming dairy products).   levothyroxine 100 MCG tablet Commonly known as: SYNTHROID TAKE 1 TABLET  BY MOUTH DAILY BEFORE BREAKFAST.   metFORMIN 1000 MG tablet Commonly known as: GLUCOPHAGE Take 1 tablet (1,000 mg total) by mouth 2 (two) times daily with a meal.   multivitamin with minerals Tabs tablet Take 1 tablet by mouth daily.   nitroGLYCERIN 0.4 MG SL tablet Commonly known as: NITROSTAT Place 1 tablet (0.4 mg total) under the tongue every 5 (five) minutes as needed for chest pain (Do not give more than 3 SL tablets in 15  minutes.).   oxyCODONE 15 MG immediate release tablet Commonly known as: ROXICODONE Take 15 mg by mouth every 6 (six) hours as needed for pain.   polyethylene glycol 17 g packet Commonly known as: MIRALAX / GLYCOLAX Take 17 g by mouth daily as needed for mild constipation.   sertraline 100 MG tablet Commonly known as: ZOLOFT Take 1 tablet (100 mg total) by mouth daily.   traZODone 100 MG tablet Commonly known as: DESYREL TAKE 1 TABLET BY MOUTH EVERYDAY AT BEDTIME What changed: See the new instructions.   vitamin C 500 MG tablet Commonly known as: ASCORBIC ACID Take 500 mg by mouth daily.   warfarin 5 MG tablet Commonly known as: COUMADIN Take as directed. If you are unsure how to take this medication, talk to your nurse or doctor. Original instructions: Take 1 tablet (5 mg total) by mouth daily. (Needs to be seen before next refill) What changed:   how much to take  when to take this  additional instructions        Brief H and P: For complete details please refer to admission H and P, but in brief Patient is a 70 year old male with history of CAD, hypertension, hyperlipidemia, pacemaker for complete heart block, severe aortic stenosis, ascending thoracic aortic aneurysm.  Status post mechanical aortic valve presented with chest pain, tightness lasting for 3 to 4 hours.  Patient reported the chest pain is right-sided, started 30 minutes prior to coming to ED, occurred at rest while watching TV.  Pain was constant with no relief. EKG showed no acute ischemic changes, troponins negative x2.  No associated shortness of breath, nausea, palpitations.  Cardiology was consulted  Hospital Course:  Chest pain, atypical, has history of CAD with significant PDA lesion -Cardiology was consulted, given history of CAD, recommended cardiac cath. -Cardiac cath revealed tight mid right coronary artery stenosis with proximal circumflex stenosis.  Patient had stents placed in his RCA and  left circumflex -Recommended aspirin and Plavix, Lipitor. -He is cleared from cardiology for discharge home    Type 2 diabetes mellitus with diabetic neuropathy, with long-term current use of insulin (HCC) -Hemoglobin A1c 6.6, continue Metformin    Paroxysmal atrial fibrillation (HCC),  S/P AVR (aortic valve replacement) -Coumadin was held for cardiac cath -Patient is being discharged on Lovenox and continue warfarin.  Patient recommended to recheck INR on Friday, 12/10/2020 and follow instructions from his PCP or cardiologist.    Hypothyroidism -TSH 8.5, free T4 1.0 -Continue Synthroid, defer to PCP to adjust dose  Hyperlipidemia -Placed on Lipitor 80 mg daily  Anxiety -Continue Zoloft  Obesity Estimated body mass index is 36.27 kg/m as calculated from the following:   Height as of this encounter:  (1.676 m).   Weight as of this encounter: 101.9 kg.     Day of Discharge S: No acute complaints, wants to go home, did not sleep well last night.  No acute issues this morning  BP 130/62 (BP Location: Right Arm)   Pulse  60   Temp 97.7 F (36.5 C) (Oral)   Resp 12   Ht 5\' 6"  (1.676 m)   Wt 95.2 kg   SpO2 96%   BMI 33.88 kg/m   Physical Exam: General: Alert and awake oriented x3 not in any acute distress. CVS: S1-S2 clear no murmur rubs or gallops Chest: clear to auscultation bilaterally, no wheezing rales or rhonchi Abdomen: soft nontender, nondistended, normal bowel sounds Extremities: no cyanosis, clubbing or edema noted bilaterally Neuro: no new deficits    Get Medicines reviewed and adjusted: Please take all your medications with you for your next visit with your Primary MD  Please request your Primary MD to go over all hospital tests and procedure/radiological results at the follow up. Please ask your Primary MD to get all Hospital records sent to his/her office.  If you experience worsening of your admission symptoms, develop shortness of breath,  life threatening emergency, suicidal or homicidal thoughts you must seek medical attention immediately by calling 911 or calling your MD immediately  if symptoms less severe.  You must read complete instructions/literature along with all the possible adverse reactions/side effects for all the Medicines you take and that have been prescribed to you. Take any new Medicines after you have completely understood and accept all the possible adverse reactions/side effects.   Do not drive when taking pain medications.   Do not take more than prescribed Pain, Sleep and Anxiety Medications  Special Instructions: If you have smoked or chewed Tobacco  in the last 2 yrs please stop smoking, stop any regular Alcohol  and or any Recreational drug use.  Wear Seat belts while driving.  Please note  You were cared for by a hospitalist during your hospital stay. Once you are discharged, your primary care physician will handle any further medical issues. Please note that NO REFILLS for any discharge medications will be authorized once you are discharged, as it is imperative that you return to your primary care physician (or establish a relationship with a primary care physician if you do not have one) for your aftercare needs so that they can reassess your need for medications and monitor your lab values.   The results of significant diagnostics from this hospitalization (including imaging, microbiology, ancillary and laboratory) are listed below for reference.      Procedures/Studies:  DG Chest 2 View  Result Date: 12/05/2020 CLINICAL DATA:  Chest pain EXAM: CHEST - 2 VIEW COMPARISON:  12/02/2018 FINDINGS: Grossly unchanged borderline enlarged cardiac silhouette and mediastinal contours with atherosclerotic plaque when thoracic aorta. Stable position of support apparatus. Sequela of previous median sternotomy and valve repair. Grossly unchanged bilateral infrahilar opacities favored to represent atelectasis. No  new focal airspace opacities. No pleural effusion or pneumothorax. No evidence of edema. No acute osseous abnormalities. IMPRESSION: Similar findings of cardiomegaly and bilateral infrahilar atelectasis without post superimposed acute cardiopulmonary disease. Electronically Signed   By: 12/04/2018 M.D.   On: 12/05/2020 12:53   CT Angio Chest PE W/Cm &/Or Wo Cm  Result Date: 12/05/2020 CLINICAL DATA:  Concern for pulmonary embolism.  High probability EXAM: CT ANGIOGRAPHY CHEST WITH CONTRAST TECHNIQUE: Multidetector CT imaging of the chest was performed using the standard protocol during bolus administration of intravenous contrast. Multiplanar CT image reconstructions and MIPs were obtained to evaluate the vascular anatomy. CONTRAST:  02/04/2021 OMNIPAQUE IOHEXOL 350 MG/ML SOLN COMPARISON:  Chest CT 02/20/2020 FINDINGS: Cardiovascular: No filling defects within the pulmonary arteries to suggest acute pulmonary embolism. Post CABG.  Mediastinum/Nodes: No axillary or supraclavicular adenopathy. No pericardial fluid. Esophagus normal. 14 mm RIGHT lower paratracheal node. 13 mm subcarinal node. Nodes unchanged from 02/20/2020 Lungs/Pleura: No pulmonary infarction. Subtle centrilobular nodularity in the lower lobes. No measurable nodule. No pleural fluid or pneumothorax Upper Abdomen: Limited view of the liver, kidneys, pancreas are unremarkable. Normal adrenal glands. Musculoskeletal: No aggressive osseous lesion Review of the MIP images confirms the above findings. IMPRESSION: 1. No evidence acute pulmonary embolism. 2. Fine nodular pattern in the lung bases suggest atypical infection or bronchiolitis. No focal consolidation. 3. Mild mediastinal adenopathy is favored reactive. No change from prior. Electronically Signed   By: Genevive Bi M.D.   On: 12/05/2020 15:29   CARDIAC CATHETERIZATION  Result Date: 12/06/2020  RPDA lesion is 80% stenosed.  Prox RCA to Mid RCA lesion is 80% stenosed.  1st Diag lesion is  75% stenosed.  Prox Cx to Mid Cx lesion is 90% stenosed.  Prox Cx lesion is 50% stenosed.  A drug-eluting stent was successfully placed using a STENT RESOLUTE ONYX 3.0X18.  Post intervention, there is a 0% residual stenosis.  A drug-eluting stent was successfully placed using a STENT RESOLUTE ONYX 3.0X18.  Post intervention, there is a 0% residual stenosis.  Post intervention, there is a 0% residual stenosis.  Andre Malone is a 70 y.o. male  [id number] LOCATION:  FACILITY: [facility] PHYSICIAN: [name], M.D. 1951/04/24 DATE OF PROCEDURE:  12/06/2020 DATE OF DISCHARGE: CARDIAC CATHETERIZATION / PCI DES RCA & LCX History obtained from chart review.70 y.o. male with AS, s/p AVR and aortic root replacement , admitted with chest pain  his enzymes were negative.  His EKG is paced.  His last cath in 2014 showed a mid PDA lesion not thought to be amenable to PCI.  He presents now for cardiac catheterization. PROCEDURE DESCRIPTION: The patient was brought to the second floor [facility] Cardiac cath lab in the postabsorptive state. He was premedicated with IV Versed and fentanyl. His right groinwas prepped and shaved in usual sterile fashion. Xylocaine 1% was used for local anesthesia. A 5 French sheath was inserted into the right common femoral  artery using standard Seldinger technique.  5 French right left second Silastic catheters were used for selective coronary angiography.  The aortic valve was not crossed since it was mechanical valve.  Isovue dye is used for the entirety of the case.  Retrograde aortic pressures monitored in the case. The patient received 10,000 units of heparin with an ACT of 279.  Isovue dye was used for the entirety of the case.  Retroaortic pressures monitored in the case.  Patient did receive 600 mg of p.o. Plavix and 20 mg of IV Pepcid. I first addressed the RCA.  Using a 6 [place] guide catheter along with a 0.14 [place] guidewire and a 2 mm x 12 mm balloon the mid RCA lesion was  crossed and predilated.  I then placed a 3 mm x 18 mm long Synergy drug-eluting stent and deployed at nominal pressures.  I postdilated with a 3.25 mm x 12 mm long balloon resulting reduction of an 80 to 90% ulcerated appearing mid dominant RCA stenosis to 0% residual. I then focused my attention on the circumflex.  Using a 6 [place] XB 3.5 cm guide catheter along with the same wire and predilatation balloon across the mid AV groove circumflex and predilated.  I then placed a 3 mm x 24 mm long Medtronic Onyx resolute drug-eluting stent deployed across a  small marginal branch and postdilated with a 3.25 x 15 mm long noncompliant balloon at nominal pressures resulting reduction of a 90% fairly focal mid AV groove circumflex stenosis just after small marginal branch with 0% residual.  There was mild encroachment on the ostium of the marginal but this was nonflow limiting.   Successful PCI and drug-eluting stenting of a mid dominant RCA as well as mid AV groove circumflex in the setting of unstable angina.  He is on aspirin Plavix currently.  He is on Coumadin for his valve and therefore will need "triple therapy" for 1 month after which aspirin can be discontinued.  He will need Plavix for at least 1 year uninterrupted.  The sheath was sewn securely in place.  The patient left lab in stable condition.  Once ACT falls below 170 the sheath will be removed and pressure held in the Cath Lab. [name]. MD, [facility] 12/06/2020 4:57 PM      LAB RESULTS: Basic Metabolic Panel: Recent Labs  Lab 12/05/20 1210 12/07/20 0244  NA 137 135  K 4.3 4.3  CL 102 105  CO2 27 24  GLUCOSE 161* 112*  BUN 22 15  CREATININE 1.26* 1.21  CALCIUM 8.8* 8.7*   Liver Function Tests: Recent Labs  Lab 12/07/20 0244  AST 25  ALT 14  ALKPHOS 53  BILITOT 0.6  PROT 7.0  ALBUMIN 3.3*   No results for input(s): LIPASE, AMYLASE in the last 168 hours. No results for input(s): AMMONIA in the last 168 hours. CBC: Recent Labs   Lab 12/06/20 0556 12/07/20 0244  WBC 8.1 8.6  HGB 12.4* 12.7*  HCT 36.9* 38.5*  MCV 88.5 90.0  PLT 156 187   Cardiac Enzymes: No results for input(s): CKTOTAL, CKMB, CKMBINDEX, TROPONINI in the last 168 hours. BNP: Invalid input(s): POCBNP CBG: Recent Labs  Lab 12/07/20 0759 12/07/20 1247  GLUCAP 101* 109*       Disposition and Follow-up: Discharge Instructions    Amb Referral to Cardiac Rehabilitation   Complete by: As directed    Diagnosis:  Coronary Stents PTCA     After initial evaluation and assessments completed: Virtual Based Care may be provided alone or in conjunction with Phase 2 Cardiac Rehab based on patient barriers.: Yes   Diet - low sodium heart healthy   Complete by: As directed    Diet - low sodium heart healthy   Complete by: As directed    Discharge instructions   Complete by: As directed    Please have your INR checked on Friday, 12/10/2020.  Follow-up with your heart doctor and PCP and follow their instructions when to stop the Lovenox.  Continue Coumadin.   Increase activity slowly   Complete by: As directed        DISPOSITION: Home   DISCHARGE FOLLOW-UP  Follow-up Information    Bennie Pierini, FNP. Schedule an appointment as soon as possible for a visit in 2 week(s).   Specialty: Family Medicine Contact information: [address] [place] [address] [phone]        Kathleene Hazel, MD. Schedule an appointment as soon as possible for a visit in 2 week(s).   Specialty: Cardiology Contact information: [address] [place] [address] [phone]                Time coordinating discharge:  35 minutes  Signed:   Thad Ranger M.D. [facility] 12/07/2020, 2:12 PM

## 2020-12-07 NOTE — TOC Benefit Eligibility Note (Signed)
Patient Product/process development scientist completed.    The patient is currently admitted and upon discharge could be taking Enoxaparin 100mg /ml.  The current 12 day co-pay is, $100.00.   The patient is insured through [place] Medicare Part D     [place], CPhT Pharmacy Patient Advocate Specialist [facility] Antimicrobial Stewardship Team Direct Number: [phone]  Fax: [phone]

## 2020-12-08 LAB — T3: T3, Total: 74 ng/dL (ref 71–180)

## 2020-12-09 ENCOUNTER — Telehealth: Payer: Self-pay

## 2020-12-09 ENCOUNTER — Telehealth (HOSPITAL_COMMUNITY): Payer: Self-pay

## 2020-12-09 NOTE — Telephone Encounter (Signed)
Called and spoke with pt in regards to CR, pt stated he is not interested at this time. Also pt stated his appt on 4/19 for PHYS PACER CK is also his f/u appt. Adv pt to contact Dr. Graciela Husbands office to verify.  Closed referral

## 2020-12-09 NOTE — Telephone Encounter (Signed)
Transition Care Management Unsuccessful Follow-up Telephone Call  Date of discharge and from where:  12/07/20 from [facility]  Attempts:  1st Attempt  Reason for unsuccessful TCM follow-up call:  Left voice message

## 2020-12-10 LAB — CULTURE, BLOOD (ROUTINE X 2)
Culture: NO GROWTH
Culture: NO GROWTH
Special Requests: ADEQUATE
Special Requests: ADEQUATE

## 2020-12-10 NOTE — Telephone Encounter (Signed)
Transition Care Management Unsuccessful Follow-up Telephone Call  Date of discharge and from where:  12/07/20 from [facility]  Attempts:  2nd Attempt  Reason for unsuccessful TCM follow-up call:  Left voice message

## 2020-12-14 ENCOUNTER — Telehealth: Payer: Self-pay

## 2020-12-14 NOTE — Telephone Encounter (Signed)
HFU scheduled for first available with Paulene Floor on 4/20

## 2020-12-15 NOTE — Telephone Encounter (Signed)
Transition Care Management Follow-up Telephone Call  Date of discharge and from where: 4/5 [name]  How have you been since you were released from the hospital? Better, chest still a little sore  Any questions or concerns? No  Items Reviewed:  Did the pt receive and understand the discharge instructions provided? Yes   Medications obtained and verified? Yes   Other? No   Any new allergies since your discharge? No   Dietary orders reviewed? Yes  Do you have support at home? Yes   Home Care and Equipment/Supplies: Were home health services ordered? no Were any new equipment or medical supplies ordered?  No  Functional Questionnaire: (I = Independent and D = Dependent) ADLs: I  Bathing/Dressing- I  Meal Prep- I  Eating- I  Maintaining continence- I  Transferring/Ambulation- I  Managing Meds- I  Follow up appointments reviewed:   PCP Hospital f/u appt confirmed? Yes  Scheduled to see Andre Malone on 12/22/20 @ 1:45 - He called back to make this appt - I offered sooner with other provider, but he refused only wanting to see his PCP  Specialist Hospital f/u appt confirmed? Yes  Scheduled to see Cardiology on 4/19 @ 1:45.  Are transportation arrangements needed? No   If their condition worsens, is the pt aware to call PCP or go to the Emergency Dept.? Yes  Was the patient provided with contact information for the PCP's office or ED? Yes  Was to pt encouraged to call back with questions or concerns? Yes
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
## 2020-12-21 ENCOUNTER — Ambulatory Visit: Payer: Medicare HMO | Admitting: Physician Assistant

## 2020-12-21 ENCOUNTER — Telehealth: Payer: Self-pay

## 2020-12-21 ENCOUNTER — Other Ambulatory Visit: Payer: Self-pay

## 2020-12-21 ENCOUNTER — Encounter: Payer: Self-pay | Admitting: Physician Assistant

## 2020-12-21 VITALS — BP 156/80 | HR 72 | Ht 66.0 in | Wt 230.0 lb

## 2020-12-21 DIAGNOSIS — I480 Paroxysmal atrial fibrillation: Secondary | ICD-10-CM | POA: Diagnosis not present

## 2020-12-21 DIAGNOSIS — E785 Hyperlipidemia, unspecified: Secondary | ICD-10-CM

## 2020-12-21 DIAGNOSIS — I2510 Atherosclerotic heart disease of native coronary artery without angina pectoris: Secondary | ICD-10-CM | POA: Diagnosis not present

## 2020-12-21 DIAGNOSIS — Z952 Presence of prosthetic heart valve: Secondary | ICD-10-CM

## 2020-12-21 DIAGNOSIS — I5023 Acute on chronic systolic (congestive) heart failure: Secondary | ICD-10-CM

## 2020-12-21 DIAGNOSIS — I10 Essential (primary) hypertension: Secondary | ICD-10-CM

## 2020-12-21 DIAGNOSIS — I442 Atrioventricular block, complete: Secondary | ICD-10-CM | POA: Diagnosis not present

## 2020-12-21 DIAGNOSIS — Z950 Presence of cardiac pacemaker: Secondary | ICD-10-CM

## 2020-12-21 LAB — CUP PACEART INCLINIC DEVICE CHECK
Battery Remaining Longevity: 34 mo
Battery Voltage: 2.86 V
Brady Statistic RA Percent Paced: 92 %
Brady Statistic RV Percent Paced: 99.61 %
Date Time Interrogation Session: 20220419164840
Implantable Lead Implant Date: 20100913
Implantable Lead Implant Date: 20150114
Implantable Lead Location: 753859
Implantable Lead Location: 753860
Implantable Pulse Generator Implant Date: 20150114
Lead Channel Impedance Value: 325 Ohm
Lead Channel Impedance Value: 612.5 Ohm
Lead Channel Pacing Threshold Amplitude: 0.75 V
Lead Channel Pacing Threshold Amplitude: 0.75 V
Lead Channel Pacing Threshold Amplitude: 1.25 V
Lead Channel Pacing Threshold Amplitude: 1.25 V
Lead Channel Pacing Threshold Pulse Width: 0.5 ms
Lead Channel Pacing Threshold Pulse Width: 0.5 ms
Lead Channel Pacing Threshold Pulse Width: 0.8 ms
Lead Channel Pacing Threshold Pulse Width: 0.8 ms
Lead Channel Sensing Intrinsic Amplitude: 3.2 mV
Lead Channel Sensing Intrinsic Amplitude: 7.9 mV
Lead Channel Setting Pacing Amplitude: 2.5 V
Lead Channel Setting Pacing Amplitude: 2.5 V
Lead Channel Setting Pacing Pulse Width: 0.5 ms
Lead Channel Setting Sensing Sensitivity: 4 mV
Pulse Gen Model: 2240
Pulse Gen Serial Number: 7586540

## 2020-12-21 NOTE — Patient Instructions (Addendum)
Medication Instructions:   Your physician recommends that you continue on your current medications as directed. Please refer to the Current Medication list given to you today.  *If you need a refill on your cardiac medications before your next appointment, please call your pharmacy*   Lab Work: NONE ORDERED  TODAY   If you have labs (blood work) drawn today and your tests are completely normal, you will receive your results only by: . MyChart Message (if you have MyChart) OR . A paper copy in the mail If you have any lab test that is abnormal or we need to change your treatment, we will call you to review the results.   Testing/Procedures: NONE ORDERED  TODAY   Follow-Up: At [facility], you and your health needs are our priority.  As part of our continuing mission to provide you with exceptional heart care, we have created designated Provider Care Teams.  These Care Teams include your primary Cardiologist (physician) and Advanced Practice Providers (APPs -  Physician Assistants and Nurse Practitioners) who all work together to provide you with the care you need, when you need it.  We recommend signing up for the patient portal called "MyChart".  Sign up information is provided on this After Visit Summary.  MyChart is used to connect with patients for Virtual Visits (Telemedicine).  Patients are able to view lab/test results, encounter notes, upcoming appointments, etc.  Non-urgent messages can be sent to your provider as well.   To learn more about what you can do with MyChart, go to [email or web address].    Your next appointment:   6 month(s)  The format for your next appointment:   In Person  Provider:   You may see Dr. Klein  or one of the following Advanced Practice Providers on your designated Care Team:    Amber Seiler, NP  Renee Ursuy, PA-C  Michael "Andy" Tillery, PA-C    Other Instructions   

## 2020-12-21 NOTE — Telephone Encounter (Signed)
Per Luster Landsberg I have called [employer] and ordered a new monitor for patient and will arrive  3-4 days

## 2020-12-22 ENCOUNTER — Ambulatory Visit (INDEPENDENT_AMBULATORY_CARE_PROVIDER_SITE_OTHER): Payer: Medicare HMO | Admitting: Nurse Practitioner

## 2020-12-22 ENCOUNTER — Encounter: Payer: Self-pay | Admitting: Nurse Practitioner

## 2020-12-22 VITALS — BP 129/78 | HR 65 | Temp 97.3°F | Resp 20 | Ht 66.0 in | Wt 227.0 lb

## 2020-12-22 DIAGNOSIS — E1159 Type 2 diabetes mellitus with other circulatory complications: Secondary | ICD-10-CM | POA: Diagnosis not present

## 2020-12-22 DIAGNOSIS — Z794 Long term (current) use of insulin: Secondary | ICD-10-CM | POA: Diagnosis not present

## 2020-12-22 DIAGNOSIS — Z09 Encounter for follow-up examination after completed treatment for conditions other than malignant neoplasm: Secondary | ICD-10-CM | POA: Diagnosis not present

## 2020-12-22 DIAGNOSIS — F3342 Major depressive disorder, recurrent, in full remission: Secondary | ICD-10-CM

## 2020-12-22 DIAGNOSIS — I152 Hypertension secondary to endocrine disorders: Secondary | ICD-10-CM

## 2020-12-22 DIAGNOSIS — Z952 Presence of prosthetic heart valve: Secondary | ICD-10-CM

## 2020-12-22 DIAGNOSIS — R69 Illness, unspecified: Secondary | ICD-10-CM | POA: Diagnosis not present

## 2020-12-22 DIAGNOSIS — E039 Hypothyroidism, unspecified: Secondary | ICD-10-CM | POA: Diagnosis not present

## 2020-12-22 DIAGNOSIS — I480 Paroxysmal atrial fibrillation: Secondary | ICD-10-CM

## 2020-12-22 DIAGNOSIS — Z955 Presence of coronary angioplasty implant and graft: Secondary | ICD-10-CM | POA: Diagnosis not present

## 2020-12-22 DIAGNOSIS — E1140 Type 2 diabetes mellitus with diabetic neuropathy, unspecified: Secondary | ICD-10-CM | POA: Diagnosis not present

## 2020-12-22 LAB — COAGUCHEK XS/INR WAIVED
INR: 3.6 — ABNORMAL HIGH (ref 0.9–1.1)
Prothrombin Time: 43.2 s

## 2020-12-22 MED ORDER — METFORMIN HCL 1000 MG PO TABS: 1000.0000 mg | ORAL_TABLET | Freq: Two times a day (BID) | ORAL | 1 refills | Status: DC

## 2020-12-22 MED ORDER — SERTRALINE HCL 100 MG PO TABS: 100.0000 mg | ORAL_TABLET | Freq: Every day | ORAL | 1 refills | Status: DC

## 2020-12-22 MED ORDER — FUROSEMIDE 40 MG PO TABS: 40.0000 mg | ORAL_TABLET | Freq: Every day | ORAL | 1 refills | Status: DC | PRN

## 2020-12-22 MED ORDER — ATORVASTATIN CALCIUM 80 MG PO TABS: 80.0000 mg | ORAL_TABLET | Freq: Every day | ORAL | 1 refills | Status: DC

## 2020-12-22 MED ORDER — LEVOTHYROXINE SODIUM 100 MCG PO TABS: 100.0000 ug | ORAL_TABLET | Freq: Every day | ORAL | 1 refills | Status: DC

## 2020-12-22 MED ORDER — AMLODIPINE BESYLATE 5 MG PO TABS: 2.5000 mg | ORAL_TABLET | Freq: Every day | ORAL | 3 refills | Status: DC

## 2020-12-22 MED ORDER — WARFARIN SODIUM 5 MG PO TABS: 5.0000 mg | ORAL_TABLET | Freq: Every day | ORAL | 1 refills | Status: DC

## 2020-12-22 NOTE — Progress Notes (Signed)
Subjective:    Patient ID: Andre Malone, male    DOB: [birth year], 70 y.o.   MRN: [id number]   Chief Complaint: Hospitalization Follow-up   HPI Patient went to the hospital on 12/05/20 with chest pain. He was admitted and had coronary stent placement. He was discharged on 12/07/20. He had follow up with cardiology yesterday. According to office  Note he was changed from pravachol to lipitor, was bridged with lovenox to therapeutic INR with coumadin. He was told to remain on ASA for 1 month then stop. He is to continue plavix. He is doing well except for some fatigue. No c/o chest pain. He is to follow up with Dr. Sanjuana Kava .   Review of Systems  Constitutional: Positive for fatigue. Negative for diaphoresis.  Eyes: Negative for pain.  Respiratory: Negative for shortness of breath.   Cardiovascular: Negative for chest pain, palpitations and leg swelling.  Gastrointestinal: Negative for abdominal pain.  Endocrine: Negative for polydipsia.  Skin: Negative for rash.  Neurological: Negative for dizziness, weakness and headaches.  Hematological: Does not bruise/bleed easily.  All other systems reviewed and are negative.      Objective:   Physical Exam Vitals and nursing note reviewed.  Constitutional:      Appearance: Normal appearance. He is well-developed.  HENT:     Head: Normocephalic.     Nose: Nose normal.  Eyes:     Pupils: Pupils are equal, round, and reactive to light.  Neck:     Thyroid: No thyroid mass or thyromegaly.     Vascular: No carotid bruit or JVD.     Trachea: Phonation normal.  Cardiovascular:     Rate and Rhythm: Normal rate and regular rhythm.     Comments: Audible clicking of heart valve Pulmonary:     Effort: Pulmonary effort is normal. No respiratory distress.     Breath sounds: Normal breath sounds.  Abdominal:     General: Bowel sounds are normal.     Palpations: Abdomen is soft.     Tenderness: There is no abdominal tenderness.   Musculoskeletal:        General: Normal range of motion.     Cervical back: Normal range of motion and neck supple.  Lymphadenopathy:     Cervical: No cervical adenopathy.  Skin:    General: Skin is warm and dry.  Neurological:     Mental Status: He is alert and oriented to person, place, and time.  Psychiatric:        Behavior: Behavior normal.        Thought Content: Thought content normal.        Judgment: Judgment normal.    BP 129/78   Pulse 65   Temp (!) 97.3 F (36.3 C) (Temporal)   Resp 20   Ht 5\' 6"  (1.676 m)   Wt 227 lb (103 kg)   SpO2 96%   BMI 36.64 kg/m         Assessment & Plan:  in today with chief complaint of Hospitalization Follow-up   1. Status post coronary artery stent placement Keep follow up with Dr. [name] See anticoag documentation.  2. Hospital discharge follow-up Hospital records reviewed  3. Hypertension associated with diabetes (HCC) - furosemide (LASIX) 40 MG tablet; Take 1 tablet (40 mg total) by mouth daily as needed for edema.  Dispense: 90 tablet; Refill: 1 - amLODipine (NORVASC) 5 MG tablet; Take 0.5 tablets (2.5 mg total) by mouth daily.  Dispense:  45 tablet; Refill: 3  4. Hypothyroidism, unspecified type - levothyroxine (SYNTHROID) 100 MCG tablet; Take 1 tablet (100 mcg total) by mouth daily before breakfast.  Dispense: 90 tablet; Refill: 1  5. Paroxysmal atrial fibrillation (HCC)  warfarin (COUMADIN) 5 MG tablet; Take 1 tablet (5 mg total) by mouth daily. (Needs to be seen before next refill)  Dispense: 90 tablet; Refill: 1  6. Recurrent major depressive disorder, in full remission (HCC) - sertraline (ZOLOFT) 100 MG tablet; Take 1 tablet (100 mg total) by mouth daily.  Dispense: 90 tablet; Refill: 1  7. Type 2 diabetes mellitus with diabetic neuropathy, with long-term current use of insulin (HCC)  metFORMIN (GLUCOPHAGE) 1000 MG tablet; Take 1 tablet (1,000 mg total) by mouth 2 (two) times daily with a  meal.  Dispense: 180 tablet; Refill: 1  meds filled  The above assessment and management plan was discussed with the patient. The patient verbalized understanding of and has agreed to the management plan. Patient is aware to call the clinic if symptoms persist or worsen. Patient is aware when to return to the clinic for a follow-up visit. Patient educated on when it is appropriate to go to the emergency department.   [name] Daphine Deutscher, FNP

## 2021-01-11 ENCOUNTER — Ambulatory Visit (INDEPENDENT_AMBULATORY_CARE_PROVIDER_SITE_OTHER): Payer: Medicare HMO

## 2021-01-11 DIAGNOSIS — I442 Atrioventricular block, complete: Secondary | ICD-10-CM

## 2021-01-11 LAB — CUP PACEART REMOTE DEVICE CHECK
Battery Remaining Longevity: 35 mo
Battery Remaining Percentage: 45 %
Battery Voltage: 2.86 V
Brady Statistic AP VP Percent: 87 %
Brady Statistic AP VS Percent: 1 %
Brady Statistic AS VP Percent: 12 %
Brady Statistic AS VS Percent: 1 %
Brady Statistic RA Percent Paced: 88 %
Brady Statistic RV Percent Paced: 99 %
Date Time Interrogation Session: 20220510110951
Implantable Lead Implant Date: 20100913
Implantable Lead Implant Date: 20150114
Implantable Lead Location: 753859
Implantable Lead Location: 753860
Implantable Pulse Generator Implant Date: 20150114
Lead Channel Impedance Value: 290 Ohm
Lead Channel Impedance Value: 460 Ohm
Lead Channel Pacing Threshold Amplitude: 0.75 V
Lead Channel Pacing Threshold Amplitude: 1.25 V
Lead Channel Pacing Threshold Pulse Width: 0.5 ms
Lead Channel Pacing Threshold Pulse Width: 0.8 ms
Lead Channel Sensing Intrinsic Amplitude: 1.8 mV
Lead Channel Sensing Intrinsic Amplitude: 6.5 mV
Lead Channel Setting Pacing Amplitude: 2.5 V
Lead Channel Setting Pacing Amplitude: 2.5 V
Lead Channel Setting Pacing Pulse Width: 0.5 ms
Lead Channel Setting Sensing Sensitivity: 4 mV
Pulse Gen Model: 2240
Pulse Gen Serial Number: 7586540

## 2021-01-13 ENCOUNTER — Telehealth: Payer: Self-pay

## 2021-01-13 NOTE — Telephone Encounter (Signed)
Left detailed message on patients voicemail to contact his cardiologist and not wait until his appointment in June.

## 2021-01-13 NOTE — Telephone Encounter (Signed)
Patient reports he is still very tired and feels like it is getting worse.  He also has worsening dizziness and a headache that will not go away.  He has a follow up with his cardiologist on 02/07/21 but wanted your opinion as to what he should do now.  Please advise.

## 2021-01-13 NOTE — Telephone Encounter (Signed)
From what o can see all labs are normal. Need to let cardiologist know.

## 2021-01-14 ENCOUNTER — Emergency Department (HOSPITAL_BASED_OUTPATIENT_CLINIC_OR_DEPARTMENT_OTHER)
Admission: EM | Admit: 2021-01-14 | Discharge: 2021-01-14 | Disposition: A | Discharge status: Home / Self Care | Payer: Medicare HMO | Attending: Emergency Medicine | Admitting: Emergency Medicine

## 2021-01-14 ENCOUNTER — Emergency Department (HOSPITAL_BASED_OUTPATIENT_CLINIC_OR_DEPARTMENT_OTHER): Payer: Medicare HMO

## 2021-01-14 ENCOUNTER — Emergency Department (HOSPITAL_BASED_OUTPATIENT_CLINIC_OR_DEPARTMENT_OTHER): Payer: Medicare HMO | Admitting: Radiology

## 2021-01-14 ENCOUNTER — Other Ambulatory Visit: Payer: Self-pay

## 2021-01-14 ENCOUNTER — Encounter (HOSPITAL_BASED_OUTPATIENT_CLINIC_OR_DEPARTMENT_OTHER): Payer: Self-pay | Admitting: Emergency Medicine

## 2021-01-14 DIAGNOSIS — Z950 Presence of cardiac pacemaker: Secondary | ICD-10-CM | POA: Diagnosis not present

## 2021-01-14 DIAGNOSIS — I480 Paroxysmal atrial fibrillation: Secondary | ICD-10-CM | POA: Insufficient documentation

## 2021-01-14 DIAGNOSIS — I2510 Atherosclerotic heart disease of native coronary artery without angina pectoris: Secondary | ICD-10-CM | POA: Insufficient documentation

## 2021-01-14 DIAGNOSIS — Z79899 Other long term (current) drug therapy: Secondary | ICD-10-CM | POA: Diagnosis not present

## 2021-01-14 DIAGNOSIS — E039 Hypothyroidism, unspecified: Secondary | ICD-10-CM | POA: Insufficient documentation

## 2021-01-14 DIAGNOSIS — R5383 Other fatigue: Secondary | ICD-10-CM | POA: Insufficient documentation

## 2021-01-14 DIAGNOSIS — R42 Dizziness and giddiness: Secondary | ICD-10-CM | POA: Diagnosis not present

## 2021-01-14 DIAGNOSIS — I509 Heart failure, unspecified: Secondary | ICD-10-CM | POA: Insufficient documentation

## 2021-01-14 DIAGNOSIS — Z7984 Long term (current) use of oral hypoglycemic drugs: Secondary | ICD-10-CM | POA: Diagnosis not present

## 2021-01-14 DIAGNOSIS — E1122 Type 2 diabetes mellitus with diabetic chronic kidney disease: Secondary | ICD-10-CM | POA: Insufficient documentation

## 2021-01-14 DIAGNOSIS — Z7901 Long term (current) use of anticoagulants: Secondary | ICD-10-CM | POA: Insufficient documentation

## 2021-01-14 DIAGNOSIS — R519 Headache, unspecified: Secondary | ICD-10-CM | POA: Insufficient documentation

## 2021-01-14 DIAGNOSIS — Z96642 Presence of left artificial hip joint: Secondary | ICD-10-CM | POA: Diagnosis not present

## 2021-01-14 DIAGNOSIS — N189 Chronic kidney disease, unspecified: Secondary | ICD-10-CM | POA: Insufficient documentation

## 2021-01-14 DIAGNOSIS — R0602 Shortness of breath: Secondary | ICD-10-CM | POA: Insufficient documentation

## 2021-01-14 DIAGNOSIS — I130 Hypertensive heart and chronic kidney disease with heart failure and stage 1 through stage 4 chronic kidney disease, or unspecified chronic kidney disease: Secondary | ICD-10-CM | POA: Diagnosis not present

## 2021-01-14 LAB — CBC WITH DIFFERENTIAL/PLATELET
Abs Immature Granulocytes: 0.02 10*3/uL (ref 0.00–0.07)
Basophils Absolute: 0.1 10*3/uL (ref 0.0–0.1)
Basophils Relative: 1 %
Eosinophils Absolute: 0.2 10*3/uL (ref 0.0–0.5)
Eosinophils Relative: 3 %
HCT: 39.8 % (ref 39.0–52.0)
Hemoglobin: 13.1 g/dL (ref 13.0–17.0)
Immature Granulocytes: 0 %
Lymphocytes Relative: 12 %
Lymphs Abs: 0.8 10*3/uL (ref 0.7–4.0)
MCH: 29 pg (ref 26.0–34.0)
MCHC: 32.9 g/dL (ref 30.0–36.0)
MCV: 88.2 fL (ref 80.0–100.0)
Monocytes Absolute: 0.8 10*3/uL (ref 0.1–1.0)
Monocytes Relative: 12 %
Neutro Abs: 5 10*3/uL (ref 1.7–7.7)
Neutrophils Relative %: 72 %
Platelets: 208 10*3/uL (ref 150–400)
RBC: 4.51 MIL/uL (ref 4.22–5.81)
RDW: 13.4 % (ref 11.5–15.5)
WBC: 7 10*3/uL (ref 4.0–10.5)
nRBC: 0 % (ref 0.0–0.2)

## 2021-01-14 LAB — BASIC METABOLIC PANEL
Anion gap: 11 (ref 5–15)
BUN: 32 mg/dL — ABNORMAL HIGH (ref 8–23)
CO2: 25 mmol/L (ref 22–32)
Calcium: 9 mg/dL (ref 8.9–10.3)
Chloride: 100 mmol/L (ref 98–111)
Creatinine, Ser: 1.62 mg/dL — ABNORMAL HIGH (ref 0.61–1.24)
GFR, Estimated: 46 mL/min — ABNORMAL LOW (ref >60)
Glucose, Bld: 177 mg/dL — ABNORMAL HIGH (ref 70–99)
Potassium: 3.9 mmol/L (ref 3.5–5.1)
Sodium: 136 mmol/L (ref 135–145)

## 2021-01-14 LAB — TROPONIN I (HIGH SENSITIVITY)
Troponin I (High Sensitivity): 10 ng/L (ref <18)
Troponin I (High Sensitivity): 11 ng/L (ref <18)

## 2021-01-14 LAB — PROTIME-INR
INR: 3.8 — ABNORMAL HIGH (ref 0.8–1.2)
Prothrombin Time: 37 seconds — ABNORMAL HIGH (ref 11.4–15.2)

## 2021-01-14 LAB — BRAIN NATRIURETIC PEPTIDE: B Natriuretic Peptide: 89.9 pg/mL (ref 0.0–100.0)

## 2021-01-14 MED ORDER — MECLIZINE HCL 25 MG PO TABS
25.0000 mg | ORAL_TABLET | Freq: Once | ORAL | Status: AC
  Administered 2021-01-14: 25 mg via ORAL
  Filled 2021-01-14: qty 1

## 2021-01-14 NOTE — ED Provider Notes (Signed)
Patient with multiple health problems and recent cardiac stenting presents with general fatigue.  He tells me that in general he just feels very tired since his recent admission last month.  He does not really report any shortness of breath.  No chest pain or tightness.  His labs are nonconcerning.  His creatinine is mildly up from his prior values.  His BNP is normal.  There is no evidence of fluid overload on x-ray.  No evidence of pneumonia.  This was reviewed by me.  His troponins are negative.  His EKG shows a paced rhythm.  His pacemaker was interrogated with no abnormal events.  I spoke with the cardiologist on-call, Dr. Izora Ribas, who states that he is comfortable with the patient going home although they will be happy to consult on the patient if he is more comfortable to be admitted to the hospital.  If not, they can arrange close follow-up.  I had a long discussion with the patient.  He does not want to be admitted to the hospital.  He does not seem to have any severe acutely worsening symptoms.  The cardiology office will arrange for close follow-up.  He was given strict return precautions.   Andre Bucco, MD 01/14/21 [phone]

## 2021-01-14 NOTE — ED Notes (Signed)
Requesting RX for meclizine, Dr. Fredderick Phenix notified. States pt should follow up with cardiology for further workup, no RX given at this time. Pt made aware.

## 2021-01-14 NOTE — ED Triage Notes (Addendum)
Pt arrives to the ED with c/o of on-going shortness of breath since his coronary artery stent placement on 4/4. Pt states over the past week he's become more lethargic and weak. Pt states that he is now so dizzy and weak that he wants to be seen.

## 2021-01-14 NOTE — ED Notes (Signed)
Patient transported to CT 
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
## 2021-01-14 NOTE — ED Notes (Signed)
Pts [employer] pacemaker report had no issues and no abnormalities.

## 2021-01-23 ENCOUNTER — Other Ambulatory Visit: Payer: Self-pay | Admitting: Nurse Practitioner

## 2021-01-23 DIAGNOSIS — I25119 Atherosclerotic heart disease of native coronary artery with unspecified angina pectoris: Secondary | ICD-10-CM

## 2021-01-23 DIAGNOSIS — I152 Hypertension secondary to endocrine disorders: Secondary | ICD-10-CM

## 2021-01-24 ENCOUNTER — Telehealth: Payer: Self-pay

## 2021-01-24 DIAGNOSIS — E1342 Other specified diabetes mellitus with diabetic polyneuropathy: Secondary | ICD-10-CM | POA: Diagnosis not present

## 2021-01-24 DIAGNOSIS — G2581 Restless legs syndrome: Secondary | ICD-10-CM | POA: Diagnosis not present

## 2021-01-24 DIAGNOSIS — R69 Illness, unspecified: Secondary | ICD-10-CM | POA: Diagnosis not present

## 2021-01-24 DIAGNOSIS — M5412 Radiculopathy, cervical region: Secondary | ICD-10-CM | POA: Diagnosis not present

## 2021-01-24 DIAGNOSIS — M545 Low back pain, unspecified: Secondary | ICD-10-CM | POA: Diagnosis not present

## 2021-01-24 DIAGNOSIS — M25519 Pain in unspecified shoulder: Secondary | ICD-10-CM | POA: Diagnosis not present

## 2021-01-24 DIAGNOSIS — Z79891 Long term (current) use of opiate analgesic: Secondary | ICD-10-CM | POA: Diagnosis not present

## 2021-01-24 NOTE — Telephone Encounter (Signed)
-----   Message from Onnie Graham, [place] sent at 01/24/2021  8:50 AM EDT ----- [place] Dragon  this may be a real concern  he is mostly atrial lead dependent, and his max functional HR is 90   awfully low for a young man  can we bring him and we can make more aggressive his rate response Thanks SK    ----- Message ----- From: Christell Constant, MD Sent: 01/14/2021   4:50 PM EDT To: Duke Salvia, MD, Kathleene Hazel, MD Subject: Rate change?                                   Hi all,  Got called about Mr. Sooy seeing [facility] for fatigue.  Cardiac work up has been benign and is going home.  In the ED, there was a question of whether his A paced V Paced rate of 60 contributed to his fatigue; wanted to let you know in case we need to change his EP follow up.  Thanks, [employer]

## 2021-01-24 NOTE — Telephone Encounter (Signed)
Attempted to contact patient to make device clinic apt. To make rate response more aggressive.   No answer, left generic message on VM.

## 2021-01-25 ENCOUNTER — Other Ambulatory Visit: Payer: Self-pay | Admitting: Nurse Practitioner

## 2021-01-25 DIAGNOSIS — F5101 Primary insomnia: Secondary | ICD-10-CM

## 2021-01-25 NOTE — Telephone Encounter (Signed)
Martin patient  Last office visit 12/22/20 Last refill 10/26/20, #90, no refills

## 2021-01-28 NOTE — Telephone Encounter (Signed)
LMOM to call device clinic with # and hours.

## 2021-01-28 NOTE — Telephone Encounter (Signed)
Patient willing to be seen after appointment with Dr Verne Spurr  Appointment 02/07/21 to program rate response more aggressive per SK.

## 2021-02-02 NOTE — Progress Notes (Signed)
Remote pacemaker transmission.   

## 2021-02-07 ENCOUNTER — Ambulatory Visit (INDEPENDENT_AMBULATORY_CARE_PROVIDER_SITE_OTHER): Payer: Medicare HMO | Admitting: Emergency Medicine

## 2021-02-07 ENCOUNTER — Ambulatory Visit (INDEPENDENT_AMBULATORY_CARE_PROVIDER_SITE_OTHER): Payer: Medicare HMO | Admitting: Cardiovascular Disease

## 2021-02-07 ENCOUNTER — Ambulatory Visit (INDEPENDENT_AMBULATORY_CARE_PROVIDER_SITE_OTHER): Payer: Medicare HMO | Admitting: *Deleted

## 2021-02-07 ENCOUNTER — Telehealth: Payer: Self-pay | Admitting: Cardiovascular Disease

## 2021-02-07 ENCOUNTER — Other Ambulatory Visit: Payer: Self-pay

## 2021-02-07 ENCOUNTER — Encounter: Payer: Self-pay | Admitting: Cardiovascular Disease

## 2021-02-07 VITALS — BP 118/60 | HR 62 | Ht 66.0 in | Wt 233.8 lb

## 2021-02-07 DIAGNOSIS — Z952 Presence of prosthetic heart valve: Secondary | ICD-10-CM | POA: Diagnosis not present

## 2021-02-07 DIAGNOSIS — I442 Atrioventricular block, complete: Secondary | ICD-10-CM | POA: Diagnosis not present

## 2021-02-07 DIAGNOSIS — I2510 Atherosclerotic heart disease of native coronary artery without angina pectoris: Secondary | ICD-10-CM | POA: Diagnosis not present

## 2021-02-07 DIAGNOSIS — E785 Hyperlipidemia, unspecified: Secondary | ICD-10-CM | POA: Diagnosis not present

## 2021-02-07 DIAGNOSIS — I10 Essential (primary) hypertension: Secondary | ICD-10-CM | POA: Diagnosis not present

## 2021-02-07 DIAGNOSIS — Z5181 Encounter for therapeutic drug level monitoring: Secondary | ICD-10-CM

## 2021-02-07 DIAGNOSIS — I480 Paroxysmal atrial fibrillation: Secondary | ICD-10-CM

## 2021-02-07 LAB — CUP PACEART INCLINIC DEVICE CHECK
Date Time Interrogation Session: 20220606105106
Implantable Lead Implant Date: 20100913
Implantable Lead Implant Date: 20150114
Implantable Lead Location: 753859
Implantable Lead Location: 753860
Implantable Pulse Generator Implant Date: 20150114
Pulse Gen Model: 2240
Pulse Gen Serial Number: 7586540

## 2021-02-07 LAB — HEPATIC FUNCTION PANEL
ALT: 15 IU/L (ref 0–44)
AST: 19 IU/L (ref 0–40)
Albumin: 4.3 g/dL (ref 3.8–4.8)
Alkaline Phosphatase: 82 IU/L (ref 44–121)
Bilirubin Total: 0.3 mg/dL (ref 0.0–1.2)
Bilirubin, Direct: 0.1 mg/dL (ref 0.00–0.40)
Total Protein: 7.6 g/dL (ref 6.0–8.5)

## 2021-02-07 LAB — LIPID PANEL
Chol/HDL Ratio: 4.1 ratio (ref 0.0–5.0)
Cholesterol, Total: 172 mg/dL (ref 100–199)
HDL: 42 mg/dL (ref >39)
LDL Chol Calc (NIH): 101 mg/dL — ABNORMAL HIGH (ref 0–99)
Triglycerides: 164 mg/dL — ABNORMAL HIGH (ref 0–149)
VLDL Cholesterol Cal: 29 mg/dL (ref 5–40)

## 2021-02-07 LAB — POCT INR: INR: 3.2 — AB (ref 2.0–3.0)

## 2021-02-07 MED ORDER — PRAVASTATIN SODIUM 40 MG PO TABS
40.0000 mg | ORAL_TABLET | Freq: Every evening | ORAL | 3 refills | Status: DC
Start: 2021-02-17 — End: 2021-02-24

## 2021-02-07 NOTE — Patient Instructions (Signed)
Medication Instructions:  Your physician has recommended you make the following change in your medication:  1.) stop atorvastatin 2.) in about 10 days start pravastatin 40 mg daily   *If you need a refill on your cardiac medications before your next appointment, please call your pharmacy*   Lab Work: none If you have labs (blood work) drawn today and your tests are completely normal, you will receive your results only by: Marland Kitchen MyChart Message (if you have MyChart) OR . A paper copy in the mail If you have any lab test that is abnormal or we need to change your treatment, we will call you to review the results.   Testing/Procedures: none   Follow-Up: At [facility], you and your health needs are our priority.  As part of our continuing mission to provide you with exceptional heart care, we have created designated Provider Care Teams.  These Care Teams include your primary Cardiologist (physician) and Advanced Practice Providers (APPs -  Physician Assistants and Nurse Practitioners) who all work together to provide you with the care you need, when you need it.   Your next appointment:   3-4  month(s)  The format for your next appointment:   In Person  Provider:   You may see Verne Carrow, MD or one of the following Advanced Practice Providers on your designated Care Team:    Ronie Spies, PA-C  Jacolyn Reedy, PA-C    Other Instructions You have been referred to Lipid Clinic for lipid management

## 2021-02-07 NOTE — Patient Instructions (Addendum)
Description   Called and spoke to pt and instructed him to take 1/2 a tablet of warfarin today and then continue to take 1 tablet daily. Coumadin Clinic [phone].   A full discussion of the nature of anticoagulants has been carried out.  A benefit risk analysis has been presented to the patient, so that they understand the justification for choosing anticoagulation at this time. The need for frequent and regular monitoring, precise dosage adjustment and compliance is stressed.  Side effects of potential bleeding are discussed.  The patient should avoid any OTC items containing aspirin or ibuprofen, and should avoid great swings in general diet.  Avoid alcohol consumption.  Call if any signs of abnormal bleeding.
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
## 2021-02-07 NOTE — Telephone Encounter (Signed)
Follow Up:     Pt said he was returning a call from today, but did not know who called him. 

## 2021-02-07 NOTE — Progress Notes (Signed)
Kathleene Hazel, MD  Cory Roughen, RN Dian Situ. OK with me to keep his goal 2.0-3.0 given the info about his SDH. Thayer Ohm        Previous Messages   ----- Message -----  From: Cory Roughen, RN  Sent: 02/07/2021  4:26 PM EDT  To: Kathleene Hazel, MD   Hi Dr. Clifton James,   I spoke to you about this pt earlier today and then I went digging in his chart a little further. From what I can tell pt's PCP had his INR goal of 2.5-3 since 09/16/2019, before that it was 2-3. However, it looks like at his hospital stay from 02/2020 pt had a SDH related to a fall. This was some documentation I found from the hospital stay by Bradley Ferris, a pharmacist on 02/29/2020:   Family Med notes states patient has an INR goal of 2.5-3.5 which is likely because of his mAVR and risk factor of atrial fibrillation. Prior pharmacist d/t with Trauma PA for INR goal. Patient with atrial fibrillation and mechanical aortic valve so 2.5-3.5 would be ideal but given his new SDH and EKGs from 6/18 showing no active afib, will aim for INR goal of 2-3.   Also, per Francis Dowse from 12/2020:   4. AFib (presumed paroxysmal)   CHA2DS2Vasc is 4, on warfarin   No tru AF noted   Warfarin is managed with his PMD    I know we spoke today and increased pt's INR range to 2.5-3.5. However, please let me know if you would like to keep this range or change it.    Thank you!

## 2021-02-07 NOTE — Progress Notes (Signed)
Patient seen in device clinic to increase slope from 8 to 10. Industry present for assistance.

## 2021-02-08 NOTE — Telephone Encounter (Addendum)
Faxed over the transportation enrollment form to [email or web address]. Called them and spoke to [place], verified with her that they received the form and that they will be calling pt to sign waver and to set up transportation  for him for his next INR check on 6/13.

## 2021-02-08 NOTE — Telephone Encounter (Signed)
Yesterday called pt and LMOM. Pt called back and left message on the anticoag voicemail. Returned call to pt.  Spoke to pt regarding his INR goal change (see anticoag encounter for further documentation). Also, at yesterday's visit pt stated that he was having trouble with transportation, followed up with pt on the phone call letting him know I was in the process of helping him get enrolled in the [facility] Transportation system.

## 2021-02-15 ENCOUNTER — Telehealth: Payer: Self-pay | Admitting: *Deleted

## 2021-02-15 NOTE — Telephone Encounter (Signed)
Pt missed today's appt; called pt and left message to call back to reschedule appt.

## 2021-02-24 ENCOUNTER — Ambulatory Visit (INDEPENDENT_AMBULATORY_CARE_PROVIDER_SITE_OTHER): Payer: Medicare HMO | Admitting: Pharmacist

## 2021-02-24 ENCOUNTER — Other Ambulatory Visit: Payer: Self-pay

## 2021-02-24 DIAGNOSIS — E785 Hyperlipidemia, unspecified: Secondary | ICD-10-CM

## 2021-02-24 DIAGNOSIS — I480 Paroxysmal atrial fibrillation: Secondary | ICD-10-CM | POA: Diagnosis not present

## 2021-02-24 DIAGNOSIS — Z952 Presence of prosthetic heart valve: Secondary | ICD-10-CM

## 2021-02-24 LAB — POCT INR: INR: 3.2 — AB (ref 2.0–3.0)

## 2021-02-24 MED ORDER — ROSUVASTATIN CALCIUM 40 MG PO TABS
40.0000 mg | ORAL_TABLET | Freq: Every day | ORAL | 11 refills | Status: DC
Start: 2021-02-24 — End: 2021-03-10

## 2021-02-24 NOTE — Patient Instructions (Addendum)
It was nice to see you today  Your LDL goal is < 55. Your LDL was 101 in June 2022 and 193 in November 2021  Start taking rosuvastatin 40mg  daily. This will lower your LDL cholesterol by > 50% and helps to reduce your risk of heart attacks and strokes.  I will call you in 2 weeks to see how you're feeling, and we'll plan to add on Praluent injections, These are stored in the fridge, given every 2 weeks in the fatty tissue of your stomach, lower your LDL cholesterol by 60% and also helps to reduce your risk of heart attacks and strokes.  Call , PharmD with any concerns #[phone]

## 2021-02-24 NOTE — Patient Instructions (Signed)
Description   Take 1/2 tablet today, then start taking 1 tablet daily except 1/2 tablet on Mondays. Recheck INR in 3 weeks. Coumadin Clinic [phone].
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
## 2021-03-04 ENCOUNTER — Encounter: Payer: Medicare HMO | Admitting: Internal Medicine

## 2021-03-10 ENCOUNTER — Telehealth: Payer: Self-pay | Admitting: Pharmacist

## 2021-03-10 MED ORDER — ROSUVASTATIN CALCIUM 20 MG PO TABS: 20.0000 mg | ORAL_TABLET | Freq: Every day | ORAL | 11 refills | Status: DC

## 2021-03-10 NOTE — Telephone Encounter (Signed)
Left message for pt to follow up with rosuvastatin tolerability. Will plan to add Praluent to target LDL goal < 55. Prior auth already approved, copay is $47/month or $141/3 months. Will need follow up labs scheduled.

## 2021-03-10 NOTE — Telephone Encounter (Signed)
Pt returned call. Reports some cramping in both shoulders and wrist and elbow on his right arm that started a few days after starting rosuvastatin. Not as bad as when he took atorvastatin.   Will decrease rosuvastatin from 40mg  to 20mg  daily. Will call pt in another week to follow up with tolerability and will plan to add Praluent once he's on stable statin dose that he's tolerating well. Pt in agreement with plan.

## 2021-03-14 ENCOUNTER — Telehealth: Payer: Self-pay | Admitting: *Deleted

## 2021-03-14 ENCOUNTER — Telehealth: Payer: Self-pay | Admitting: Cardiovascular Disease

## 2021-03-14 ENCOUNTER — Other Ambulatory Visit: Payer: Self-pay | Admitting: Family Medicine

## 2021-03-14 DIAGNOSIS — F5101 Primary insomnia: Secondary | ICD-10-CM

## 2021-03-14 NOTE — Telephone Encounter (Signed)
Pt called and stated that he could not come to his appointment on Thursday 7/14. Pt stated that he will be getting dental work on Thursday. Informed pt that if dentist needs pt to hold warfarin then dentist will need to fax over a clearance. Pt stated that his dentist had a few questions informed pt that the dentist could call our office. Pt stated he had our number to give to the dentist. Asked pt is we could reschedule his INR check for another day this week pt stated he will be out of town and will call back to reschedule.

## 2021-03-14 NOTE — Telephone Encounter (Signed)
Andre Malone called stating he was returning a call, but I was unable to find documentation as to what it was in regards to. Please advise.

## 2021-03-14 NOTE — Telephone Encounter (Signed)
No one from coumadin clinic attempted to call pt. Called and spoke to pt and he stated that he received a my chart message about a phone call today from Dr. Gibson Ramp office. Pt stated he now thinks it was from when he had spoken to [place] earlier. Pt stated everything is copacetic. Pt stated he has no questions or concerns at this time.

## 2021-03-17 NOTE — Telephone Encounter (Signed)
Called pt and left message for rosuvastatin 20mg  daily tolerability update and potential Praluent add-on.

## 2021-03-18 NOTE — Telephone Encounter (Signed)
2nd msg left

## 2021-03-22 MED ORDER — ROSUVASTATIN CALCIUM 10 MG PO TABS
10.0000 mg | ORAL_TABLET | Freq: Every day | ORAL | 11 refills | Status: DC
Start: 2021-03-22 — End: 2021-04-11

## 2021-03-22 NOTE — Telephone Encounter (Signed)
Called pt again. He reports feeling better on lower dose of rosuvastatin but is still experiencing still some cramping in his arms and legs that's bothersome.  Will continue to decrease rosuvastatin dose with goal of finding max tolerated dose. He'll start rosuvastatin 10mg  daily. Advised him if he doesn't tolerate this well to cut the tablets in half and take 5mg  daily. I'll call him in 2 weeks for update. Still planning to add on Praluent once he's on stable statin dose that he's tolerating.

## 2021-03-22 NOTE — Addendum Note (Signed)
Addended by: Trelon Plush E on: 03/22/2021 03:40 PM   Modules accepted: Orders

## 2021-03-25 ENCOUNTER — Ambulatory Visit: Payer: Self-pay | Admitting: Nurse Practitioner

## 2021-03-28 ENCOUNTER — Ambulatory Visit (INDEPENDENT_AMBULATORY_CARE_PROVIDER_SITE_OTHER): Payer: Medicare HMO

## 2021-03-28 ENCOUNTER — Other Ambulatory Visit: Payer: Self-pay

## 2021-03-28 DIAGNOSIS — Z952 Presence of prosthetic heart valve: Secondary | ICD-10-CM

## 2021-03-28 DIAGNOSIS — I480 Paroxysmal atrial fibrillation: Secondary | ICD-10-CM

## 2021-03-28 LAB — POCT INR: INR: 5.3 — AB (ref 2.0–3.0)

## 2021-03-28 NOTE — Patient Instructions (Signed)
Description   Skip today and tomorrow's dosage of Warfarin, then start taking 1 tablet daily except 1/2 tablet on Mondays, Wednesdays and Fridays. Recheck INR in 1 week. [facility] [phone].

## 2021-03-31 LAB — HM DIABETES EYE EXAM

## 2021-04-07 ENCOUNTER — Other Ambulatory Visit: Payer: Self-pay

## 2021-04-07 ENCOUNTER — Ambulatory Visit (INDEPENDENT_AMBULATORY_CARE_PROVIDER_SITE_OTHER): Payer: Medicare HMO

## 2021-04-07 DIAGNOSIS — I480 Paroxysmal atrial fibrillation: Secondary | ICD-10-CM | POA: Diagnosis not present

## 2021-04-07 DIAGNOSIS — Z5181 Encounter for therapeutic drug level monitoring: Secondary | ICD-10-CM | POA: Diagnosis not present

## 2021-04-07 LAB — POCT INR: INR: 3.3 — AB (ref 2.0–3.0)

## 2021-04-07 NOTE — Patient Instructions (Signed)
Description   Skip today dosage of Warfarin, then start taking 1/2 tablet daily except 1 tablet on Mondays, Wednesdays and Fridays. Recheck INR in 2 weeks. [facility] [phone].

## 2021-04-11 ENCOUNTER — Telehealth: Payer: Self-pay | Admitting: Pharmacist

## 2021-04-11 MED ORDER — PRALUENT 75 MG/ML ~~LOC~~ SOAJ: 1.0000 "pen " | SUBCUTANEOUS | 11 refills | Status: DC

## 2021-04-11 NOTE — Telephone Encounter (Signed)
Pt returned call, he decreased his rosuvastatin to 5mg  daily and is still experiencing muscle cramping.  Will stop rosuvastatin and wait 1-2 weeks to ensure sx are related and improve, then will start Praluent 75mg  Q2W. Will call pt in 1 month to follow up with tolerability.

## 2021-04-11 NOTE — Addendum Note (Signed)
Addended by: Kalene Cutler E on: 04/11/2021 10:33 AM   Modules accepted: Orders

## 2021-04-11 NOTE — Telephone Encounter (Signed)
Called pt and left message to discuss statin tolerability on lower dose of rosuvastatin 10mg  daily. Had advised pt he could decrease dose further if he didn't tolerate 10mg  dose well.  Planning to add Praluent once his max tolerated rosuvastatin dose is determined. PA already approved.

## 2021-04-12 ENCOUNTER — Ambulatory Visit (INDEPENDENT_AMBULATORY_CARE_PROVIDER_SITE_OTHER): Payer: Medicare HMO

## 2021-04-12 DIAGNOSIS — I442 Atrioventricular block, complete: Secondary | ICD-10-CM

## 2021-04-12 LAB — CUP PACEART REMOTE DEVICE CHECK
Battery Remaining Longevity: 8 mo
Battery Remaining Percentage: 11 %
Battery Voltage: 2.84 V
Brady Statistic AP VP Percent: 92 %
Brady Statistic AP VS Percent: 1 %
Brady Statistic AS VP Percent: 7.7 %
Brady Statistic AS VS Percent: 1 %
Brady Statistic RA Percent Paced: 93 %
Brady Statistic RV Percent Paced: 99 %
Date Time Interrogation Session: 20220809074930
Implantable Lead Implant Date: 20100913
Implantable Lead Implant Date: 20150114
Implantable Lead Location: 753859
Implantable Lead Location: 753860
Implantable Pulse Generator Implant Date: 20150114
Lead Channel Impedance Value: 290 Ohm
Lead Channel Impedance Value: 450 Ohm
Lead Channel Pacing Threshold Amplitude: 0.75 V
Lead Channel Pacing Threshold Amplitude: 1.25 V
Lead Channel Pacing Threshold Pulse Width: 0.5 ms
Lead Channel Pacing Threshold Pulse Width: 0.8 ms
Lead Channel Sensing Intrinsic Amplitude: 3 mV
Lead Channel Sensing Intrinsic Amplitude: 7.2 mV
Lead Channel Setting Pacing Amplitude: 2.5 V
Lead Channel Setting Pacing Amplitude: 2.5 V
Lead Channel Setting Pacing Pulse Width: 0.5 ms
Lead Channel Setting Sensing Sensitivity: 4 mV
Pulse Gen Model: 2240
Pulse Gen Serial Number: 7586540

## 2021-04-26 DIAGNOSIS — Z79891 Long term (current) use of opiate analgesic: Secondary | ICD-10-CM | POA: Diagnosis not present

## 2021-04-26 DIAGNOSIS — M25519 Pain in unspecified shoulder: Secondary | ICD-10-CM | POA: Diagnosis not present

## 2021-04-26 DIAGNOSIS — M545 Low back pain, unspecified: Secondary | ICD-10-CM | POA: Diagnosis not present

## 2021-04-26 DIAGNOSIS — M5412 Radiculopathy, cervical region: Secondary | ICD-10-CM | POA: Diagnosis not present

## 2021-04-26 DIAGNOSIS — R69 Illness, unspecified: Secondary | ICD-10-CM | POA: Diagnosis not present

## 2021-04-26 DIAGNOSIS — G2581 Restless legs syndrome: Secondary | ICD-10-CM | POA: Diagnosis not present

## 2021-04-26 DIAGNOSIS — E1342 Other specified diabetes mellitus with diabetic polyneuropathy: Secondary | ICD-10-CM | POA: Diagnosis not present

## 2021-05-05 NOTE — Progress Notes (Signed)
Remote pacemaker transmission.   
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
## 2021-05-10 ENCOUNTER — Telehealth: Payer: Self-pay

## 2021-05-10 NOTE — Telephone Encounter (Signed)
Returned patients phone call. Advised of increase monthly battery checks. Verbalized understanding.

## 2021-05-10 NOTE — Telephone Encounter (Signed)
Patient called back and would like a call back today  

## 2021-05-10 NOTE — Telephone Encounter (Signed)
Increase battery checks for monthly d/t 7.7 months until ERI.   Attempted to contact patient to advise. No answer, LMTCB.   Update in [place] as well.

## 2021-05-11 ENCOUNTER — Telehealth: Payer: Self-pay | Admitting: Pharmacist

## 2021-05-11 NOTE — Telephone Encounter (Signed)
Left message for pt to follow up with Praluent tolerability. Will need labs scheduled if tolerating well. If not, could try statin rechallenge with pravastatin.

## 2021-05-12 ENCOUNTER — Encounter: Payer: Self-pay | Admitting: Physician Assistant

## 2021-05-12 ENCOUNTER — Ambulatory Visit: Payer: Medicare HMO | Admitting: Physician Assistant

## 2021-05-12 ENCOUNTER — Other Ambulatory Visit: Payer: Self-pay

## 2021-05-12 ENCOUNTER — Ambulatory Visit (INDEPENDENT_AMBULATORY_CARE_PROVIDER_SITE_OTHER): Payer: Medicare HMO

## 2021-05-12 VITALS — BP 118/70 | HR 62 | Ht 66.0 in | Wt 230.4 lb

## 2021-05-12 DIAGNOSIS — I480 Paroxysmal atrial fibrillation: Secondary | ICD-10-CM

## 2021-05-12 DIAGNOSIS — R5383 Other fatigue: Secondary | ICD-10-CM

## 2021-05-12 DIAGNOSIS — Z952 Presence of prosthetic heart valve: Secondary | ICD-10-CM

## 2021-05-12 DIAGNOSIS — Z950 Presence of cardiac pacemaker: Secondary | ICD-10-CM | POA: Diagnosis not present

## 2021-05-12 DIAGNOSIS — Z5181 Encounter for therapeutic drug level monitoring: Secondary | ICD-10-CM | POA: Diagnosis not present

## 2021-05-12 DIAGNOSIS — E785 Hyperlipidemia, unspecified: Secondary | ICD-10-CM | POA: Diagnosis not present

## 2021-05-12 DIAGNOSIS — I2510 Atherosclerotic heart disease of native coronary artery without angina pectoris: Secondary | ICD-10-CM | POA: Diagnosis not present

## 2021-05-12 DIAGNOSIS — I10 Essential (primary) hypertension: Secondary | ICD-10-CM | POA: Diagnosis not present

## 2021-05-12 DIAGNOSIS — I350 Nonrheumatic aortic (valve) stenosis: Secondary | ICD-10-CM | POA: Diagnosis not present

## 2021-05-12 LAB — POCT INR: INR: 3.9 — AB (ref 2.0–3.0)

## 2021-05-12 MED ORDER — AMOXICILLIN 500 MG PO TABS: ORAL_TABLET | ORAL | 2 refills | Status: DC

## 2021-05-12 NOTE — Patient Instructions (Addendum)
Medication Instructions:  You will start Amoxicillin 500 mg taking 4 tablets 30-60 minutes prior to dental procedures  Stop the Praluent   *If you need a refill on your cardiac medications before your next appointment, please call your pharmacy*   Lab Work: TODAY:  BMET, CBC, TSH, FT4, & BLOOD CULTURES X' 2  If you have labs (blood work) drawn today and your tests are completely normal, you will receive your results only by: MyChart Message (if you have MyChart) OR A paper copy in the mail If you have any lab test that is abnormal or we need to change your treatment, we will call you to review the results.   Testing/Procedures: Your physician has recommended that you have a sleep study. This test records several body functions during sleep, including: brain activity, eye movement, oxygen and carbon dioxide blood levels, heart rate and rhythm, breathing rate and rhythm, the flow of air through your mouth and nose, snoring, body muscle movements, and chest and belly movement.    Follow-Up: At [facility], you and your health needs are our priority.  As part of our continuing mission to provide you with exceptional heart care, we have created designated Provider Care Teams.  These Care Teams include your primary Cardiologist (physician) and Advanced Practice Providers (APPs -  Physician Assistants and Nurse Practitioners) who all work together to provide you with the care you need, when you need it.  We recommend signing up for the patient portal called "MyChart".  Sign up information is provided on this After Visit Summary.  MyChart is used to connect with patients for Virtual Visits (Telemedicine).  Patients are able to view lab/test results, encounter notes, upcoming appointments, etc.  Non-urgent messages can be sent to your provider as well.   To learn more about what you can do with MyChart, go to [email or web address].    Your next appointment:   3 month(s)  The format for  your next appointment:   In Person  Provider:   You may see Verne Carrow, MD or one of the following Advanced Practice Providers on your designated Care Team:   Ronie Spies, PA-C Jacolyn Reedy, PA-C   Other Instructions Endocarditis Information  You may be at risk for developing endocarditis since you have an artificial heart valve or a repaired heart valve. Endocarditis is an infection of the lining of the heart or heart valves. Certain surgical and dental procedures may put you at risk, such as teeth cleaning or other dental procedures or other medical procedures. Notify our office or your dentist before having any dental work or invasive/surgical procedures. You will need to take antibiotics before certain procedures. To prevent endocarditis, maintain good oral health. Seek prompt medical attention for any mouth/gum, skin or urinary tract infections.

## 2021-05-12 NOTE — Patient Instructions (Signed)
Description   Hold today's dose and only take 0.5 tablet tomorrow and then continue taking 1/2 tablet daily except 1 tablet on Mondays, Wednesdays and Fridays. Recheck INR in 2 weeks. Coumadin Clinic [phone].

## 2021-05-13 LAB — BASIC METABOLIC PANEL
BUN/Creatinine Ratio: 15 (ref 10–24)
BUN: 22 mg/dL (ref 8–27)
CO2: 21 mmol/L (ref 20–29)
Calcium: 9.2 mg/dL (ref 8.6–10.2)
Chloride: 101 mmol/L (ref 96–106)
Creatinine, Ser: 1.48 mg/dL — ABNORMAL HIGH (ref 0.76–1.27)
Glucose: 155 mg/dL — ABNORMAL HIGH (ref 65–99)
Potassium: 4 mmol/L (ref 3.5–5.2)
Sodium: 138 mmol/L (ref 134–144)
eGFR: 51 mL/min/{1.73_m2} — ABNORMAL LOW (ref >59)

## 2021-05-13 LAB — CBC
Hematocrit: 39.3 % (ref 37.5–51.0)
Hemoglobin: 13 g/dL (ref 13.0–17.7)
MCH: 28.6 pg (ref 26.6–33.0)
MCHC: 33.1 g/dL (ref 31.5–35.7)
MCV: 87 fL (ref 79–97)
Platelets: 166 10*3/uL (ref 150–450)
RBC: 4.54 x10E6/uL (ref 4.14–5.80)
RDW: 16.2 % — ABNORMAL HIGH (ref 11.6–15.4)
WBC: 8.8 10*3/uL (ref 3.4–10.8)

## 2021-05-13 LAB — TSH: TSH: 2.68 u[IU]/mL (ref 0.450–4.500)

## 2021-05-13 LAB — T4, FREE: Free T4: 1.27 ng/dL (ref 0.82–1.77)

## 2021-05-13 NOTE — Telephone Encounter (Signed)
Pt called clinic back, states he took 1 dose of Praluent and became fatigued. He is already intolerant to atorvastatin 80mg  daily and rosuvastatin 5mg  daily. Previously took pravastatin which didn't control his cholesterol well. He will keep an eye out to see if his fatigue improves in the next few weeks and will give the pharmacist an update at his next INR check. Would prefer to rechallenge with Praluent if able. Otherwise, can restart him on pravastatin and add ezetimibe.

## 2021-05-16 ENCOUNTER — Encounter (HOSPITAL_BASED_OUTPATIENT_CLINIC_OR_DEPARTMENT_OTHER): Payer: Self-pay | Admitting: Emergency Medicine

## 2021-05-16 ENCOUNTER — Other Ambulatory Visit: Payer: Self-pay

## 2021-05-16 ENCOUNTER — Emergency Department (HOSPITAL_BASED_OUTPATIENT_CLINIC_OR_DEPARTMENT_OTHER)
Admission: EM | Admit: 2021-05-16 | Discharge: 2021-05-16 | Disposition: A | Discharge status: Home / Self Care | Payer: Medicare HMO | Attending: Emergency Medicine | Admitting: Emergency Medicine

## 2021-05-16 DIAGNOSIS — Z7901 Long term (current) use of anticoagulants: Secondary | ICD-10-CM | POA: Diagnosis not present

## 2021-05-16 DIAGNOSIS — Z8616 Personal history of COVID-19: Secondary | ICD-10-CM | POA: Insufficient documentation

## 2021-05-16 DIAGNOSIS — K0889 Other specified disorders of teeth and supporting structures: Secondary | ICD-10-CM | POA: Insufficient documentation

## 2021-05-16 DIAGNOSIS — Z7902 Long term (current) use of antithrombotics/antiplatelets: Secondary | ICD-10-CM | POA: Insufficient documentation

## 2021-05-16 DIAGNOSIS — Z96642 Presence of left artificial hip joint: Secondary | ICD-10-CM | POA: Diagnosis not present

## 2021-05-16 DIAGNOSIS — I5023 Acute on chronic systolic (congestive) heart failure: Secondary | ICD-10-CM | POA: Diagnosis not present

## 2021-05-16 DIAGNOSIS — Z79899 Other long term (current) drug therapy: Secondary | ICD-10-CM | POA: Insufficient documentation

## 2021-05-16 DIAGNOSIS — I130 Hypertensive heart and chronic kidney disease with heart failure and stage 1 through stage 4 chronic kidney disease, or unspecified chronic kidney disease: Secondary | ICD-10-CM | POA: Insufficient documentation

## 2021-05-16 DIAGNOSIS — N189 Chronic kidney disease, unspecified: Secondary | ICD-10-CM | POA: Diagnosis not present

## 2021-05-16 DIAGNOSIS — E1140 Type 2 diabetes mellitus with diabetic neuropathy, unspecified: Secondary | ICD-10-CM | POA: Diagnosis not present

## 2021-05-16 DIAGNOSIS — E039 Hypothyroidism, unspecified: Secondary | ICD-10-CM | POA: Diagnosis not present

## 2021-05-16 DIAGNOSIS — Z7984 Long term (current) use of oral hypoglycemic drugs: Secondary | ICD-10-CM | POA: Insufficient documentation

## 2021-05-16 DIAGNOSIS — Z794 Long term (current) use of insulin: Secondary | ICD-10-CM | POA: Diagnosis not present

## 2021-05-16 DIAGNOSIS — E1122 Type 2 diabetes mellitus with diabetic chronic kidney disease: Secondary | ICD-10-CM | POA: Diagnosis not present

## 2021-05-16 DIAGNOSIS — I2510 Atherosclerotic heart disease of native coronary artery without angina pectoris: Secondary | ICD-10-CM | POA: Insufficient documentation

## 2021-05-16 DIAGNOSIS — Z950 Presence of cardiac pacemaker: Secondary | ICD-10-CM | POA: Diagnosis not present

## 2021-05-16 MED ORDER — OXYCODONE-ACETAMINOPHEN 5-325 MG PO TABS
1.0000 | ORAL_TABLET | Freq: Once | ORAL | Status: AC
  Administered 2021-05-16: 1 via ORAL
  Filled 2021-05-16: qty 1

## 2021-05-16 MED ORDER — AMOXICILLIN-POT CLAVULANATE 875-125 MG PO TABS: 1.0000 | ORAL_TABLET | Freq: Two times a day (BID) | ORAL | 0 refills | Status: AC

## 2021-05-16 MED ORDER — AMOXICILLIN-POT CLAVULANATE 875-125 MG PO TABS
1.0000 | ORAL_TABLET | Freq: Once | ORAL | Status: AC
  Administered 2021-05-16: 1 via ORAL
  Filled 2021-05-16: qty 1

## 2021-05-16 NOTE — ED Notes (Signed)
Pt discharged home after verbalizing understanding of discharge instructions; nad noted. 

## 2021-05-16 NOTE — Discharge Instructions (Signed)
You were given a prescription for antibiotics. Please take the antibiotic prescription fully.   Please follow-up with a dentist in the next 5 to 7 days for reevaluation.  If you do not have a dentist, resources were provided for dentist in the area in your discharge summary.  Please contact one of the offices that are listed and make an appointment for follow-up.  Please return to the emergency department for any new or worsening symptoms.  

## 2021-05-16 NOTE — ED Triage Notes (Addendum)
Pt presents to ED POV. Pt c/o generalized dental pain. Reports taking home OxyContin w/o relief
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
## 2021-05-16 NOTE — ED Notes (Signed)
Pt from home with dental pain ongoing for several years. Pt states his left side in entirety is painful. Pt saving money to have teeth pulled and dentures made. Pt alert & oriented, nad noted.

## 2021-05-17 ENCOUNTER — Other Ambulatory Visit: Payer: Self-pay | Admitting: Nurse Practitioner

## 2021-05-17 DIAGNOSIS — I480 Paroxysmal atrial fibrillation: Secondary | ICD-10-CM

## 2021-05-17 DIAGNOSIS — E1159 Type 2 diabetes mellitus with other circulatory complications: Secondary | ICD-10-CM

## 2021-05-17 DIAGNOSIS — I152 Hypertension secondary to endocrine disorders: Secondary | ICD-10-CM

## 2021-05-17 DIAGNOSIS — F3342 Major depressive disorder, recurrent, in full remission: Secondary | ICD-10-CM

## 2021-05-17 DIAGNOSIS — E1140 Type 2 diabetes mellitus with diabetic neuropathy, unspecified: Secondary | ICD-10-CM

## 2021-05-18 LAB — CULTURE, BLOOD (SINGLE)

## 2021-05-19 ENCOUNTER — Telehealth: Payer: Self-pay | Admitting: Cardiovascular Disease

## 2021-05-19 NOTE — Telephone Encounter (Signed)
The patient has been notified of the result and verbalized understanding.  All questions (if any) were answered. Loa Socks, LPN 4/00/8676 19:50 AM

## 2021-05-19 NOTE — Telephone Encounter (Signed)
-----   Message from Laurann Montana, [place] sent at 05/19/2021 10:36 AM EDT ----- Can inform patient that blood cultures did not show any bacteria in his blood stream (we checked this given his fatigue).

## 2021-05-19 NOTE — Telephone Encounter (Signed)
Patient states he was returning call 

## 2021-05-23 ENCOUNTER — Telehealth: Payer: Self-pay | Admitting: Physician Assistant

## 2021-05-23 ENCOUNTER — Telehealth: Payer: Self-pay | Admitting: *Deleted

## 2021-05-23 DIAGNOSIS — Z952 Presence of prosthetic heart valve: Secondary | ICD-10-CM

## 2021-05-23 NOTE — Telephone Encounter (Signed)
Patient asked to speak to someone in the Coumadin Clinic. No other information was provided

## 2021-05-23 NOTE — Telephone Encounter (Signed)
Called pt again and he states he needs to have teeth pulled but doesn't know when it will be. Asked if he the Dentist faxed over clearance and he stated they are doing so. Also, he asked about the process for dental extractions and I explained to him. He said he would need to cancel his appt for 9/22 due to this and advised since he don't have a date to keep it until he finds out if it will be this week or not; and to let us know the plans from the dentist before or on the 9/22 date. If procedure is planned that day we can cancel. He was agreeable to the plan and will let us know by Thursday, 05/26/21.

## 2021-05-23 NOTE — Telephone Encounter (Signed)
   [facility] Pre-operative Risk Assessment    Patient Name: Andre Malone  DOB: April 20, 1951 MRN: [id number]  HEARTCARE STAFF:  - IMPORTANT!!!!!! Under Visit Info/Reason for Call, type in Other and utilize the format Clearance MM/DD/YY or Clearance TBD. Do not use dashes or single digits. - Please review there is not already an duplicate clearance open for this procedure. - If request is for dental extraction, please clarify the # of teeth to be extracted. - If the patient is currently at the dentist's office, call Pre-Op Callback Staff (MA/nurse) to input urgent request.  - If the patient is not currently in the dentist office, please route to the Pre-Op pool.  Request for surgical clearance:  What type of surgery is being performed? EXTRACTION OF 10 TEETH  When is this surgery scheduled? TBD  What type of clearance is required (medical clearance vs. Pharmacy clearance to hold med vs. Both)? BOTH  Are there any medications that need to be held prior to surgery and how long? CLEARANCE REQUEST STATES PT DOES LOVENOX INJECTIONS DAILY; MED LIST REFLECTS PT IS ON PLAVIX AND WARFARIN AS WELL  Practice name and name of physician performing surgery? [facility]; DR. Elberta Spaniel  What is the office phone number? [phone]   7.   What is the office fax number? [phone]  8.   Anesthesia type (None, local, MAC, general) ? LOCAL   Julaine Hua 05/23/2021, 5:55 PM  _________________________________________________________________   (provider comments below)

## 2021-05-23 NOTE — Telephone Encounter (Signed)
Returned call to the pt and was sent to voicemail; therefore, left a message for the pt to call back to evaluate what needs since no description provided from the pt on initial message.

## 2021-05-24 NOTE — Telephone Encounter (Signed)
   Patient Name: Andre Malone  DOB: [birth year] MRN: [id number]  Primary Cardiologist: Verne Carrow, MD  Chart reviewed as part of pre-operative protocol coverage. I saw patient in clinic recently 05/12/21 and do feel he is medically cleared for the procedure but greater issue is holding blood thinner therapy - he had PCI in 12/2020, previously recommended to continue Plavix through 12/2021. Not on ASA due to concomitant warfarin (hx scending thoracic aortic aneurysm s/p replacement of valve and root with mechanical St Jude AV conduit). I will route to Dr. Clifton James for his input on holding Plavix if they need to do all 10 teeth at once. Afterwards we can coordinate with pharm team re: Coumadin. Obviously tricky situation - dental health will be important given his cardiac surgery history. Dr. Clifton James - Please route response to P CV DIV PREOP (the pre-op pool). Thank you.   Laurann Montana, PA-C 05/24/2021, 5:21 PM

## 2021-05-25 ENCOUNTER — Telehealth: Payer: Self-pay | Admitting: *Deleted

## 2021-05-25 MED ORDER — AMOXICILLIN 500 MG PO CAPS: ORAL_CAPSULE | ORAL | 0 refills | Status: DC

## 2021-05-25 NOTE — Telephone Encounter (Signed)
Left message for pt to call back so he can be advised to take the ABX prior to his dental procedure.  Rx has been sent in already.

## 2021-05-25 NOTE — Telephone Encounter (Signed)
   Patient Name: Andre Malone  DOB: March 11, 1951 MRN: [id number]  Primary Cardiologist: Verne Carrow, MD  Chart reviewed as part of pre-operative protocol coverage. To summarize recommendations: - Patient is medically cleared to proceed with extraction.  - If Plavix needs to be held: Dr. Clifton James feels it is OK to hold Plavix if necessary. We would recommend to minimize the amount of time off Plavix as much as possible and restart as soon as felt safe as the patient had recent stenting in 12/2020. - Regarding anticoagulation, pharmacist reviewed chart and states: "Per office protocol, patient can hold warfarin for 5 days prior to procedure. Patient WILL need bridging with Lovenox (enoxaparin) around procedure. Patient should restart warfarin on the evening of procedure or day after, at discretion of procedure MD." The patient has his next INR scheduled tomorrow 9/22 at which time final details can be discussed. - The patient does need SBE prophylaxis. I had reviewed this at our recent OV but on 9/12 it was cancelled off his MAR. PharmD Thayer Ohm sent in an updated rx for amoxicillin amoxicillin 500mg  to take 4 tablets by  mouth 30-60 minutes prior to dental work. I will route this to callback team to make sure to remind the patient to take this.  Will route this bundled recommendation to requesting provider via Epic fax function. Please call with questions.  , PA-C 05/25/2021, 12:42 PM

## 2021-05-25 NOTE — Telephone Encounter (Signed)
Patient with diagnosis of A Fib, with mechanical AVR on warfarin for anticoagulation.  Next INR scheduled tomorrow 9/22  Procedure: extraction of 10 teeth Date of procedure: TBD   CHA2DS2-VASc Score = 4  {his indicates a 4.8% annual risk of stroke. The patient's score is based upon: CHF History: 0 HTN History: 1 Diabetes History: 1 Stroke History: 0 Vascular Disease History: 1 Age Score: 1 Gender Score: 0   CrCl 66 mL/min Platelet count 166K  Patient DOES require pre-op antibiotics for dental procedure.  Rx sent to pharmacy.  Per office protocol, patient can hold warfarin for 5 days prior to procedure.    Patient WILL need bridging with Lovenox (enoxaparin) around procedure.  Patient should restart warfarin on the evening of procedure or day after, at discretion of procedure MD

## 2021-05-25 NOTE — Telephone Encounter (Signed)
I will route to Dr. Clifton James for input since patient now indicates he is not taking Plavix. He had recent PCI 12/2020 so really out to be on it - unclear why he is not. Dr. Clifton James, would you suggest resuming until 5 days prior to procedure (+/- load) or resume after his dental procedure? He is seeing Coumadin clinic tomorrow for INR visit - would need Lovenox-Coumadin bridging due to mechanical AV conduit.

## 2021-05-25 NOTE — Telephone Encounter (Signed)
Will route to pharmacy team for input on holding Coumadin then will bundle recommendation. As outlined below, I saw patient recently and he is medically cleared. Dr. Clifton James has given OK to hold Plavix. He will need to be reminded of SBE ppx - We did recently send this rx in 05/12/21 for amoxicillin 500mg  Take 4 tablets by  mouth 30-60 minutes prior to dental work but it was discontinued off MAR by another user on 05/16/21.

## 2021-05-25 NOTE — Telephone Encounter (Signed)
Pt called back.  He received a call from the Dentist stating that he was on too many blood thinners and they couldn't take that liability and perform his planned procedure.  Pt was questioning the blood thinners, and stated he didn't know that he was on but one and that is Warfarin.  I advised he was prescribed Plavix (Clodpidogrel) back in April 2022, when he was discharged from the hospital. Pt states that he hasn't been taking it and he wasn't aware of it. Had him go over his medications with the me over the phone with the bottles, and he doesn't have it.   Pt really needs to have the procedure done, stated that he was in a lot of pain and asks if he hadn't been taking it, can he continue off of it at least to have the procedure done?  Please advise.

## 2021-05-25 NOTE — Telephone Encounter (Signed)
   [facility] calling to get f/u, she said pt is in pain and would like to get clearance as soon as possible

## 2021-05-25 NOTE — Telephone Encounter (Signed)
Pt returned my call and he has been made aware that he will have to take Amoxicillin, 500 mg, 4 tablets 30-60 mins prior to his dental procedure. He verbalized understanding.

## 2021-05-26 ENCOUNTER — Telehealth: Payer: Self-pay | Admitting: *Deleted

## 2021-05-26 MED ORDER — CLOPIDOGREL BISULFATE 75 MG PO TABS: 75.0000 mg | ORAL_TABLET | Freq: Every day | ORAL | 3 refills | Status: DC

## 2021-05-26 NOTE — Telephone Encounter (Signed)
See Dr. Gibson Ramp response below to restart Plavix 75mg  daily as he should be taking, without any loading dose, and stop 5 days prior to surgery. Would need to restart when dentist feels it is safe. We have already provided his finalized medical clearance to include instructions on his blood thinners so it is really up to the dentist at this point. Thanks.

## 2021-05-26 NOTE — Telephone Encounter (Signed)
Called pt since he was due to be seen today and he had previous called & stated he was waiting on an update from the dentist; pt states he was told by [facility] things had to be put on hold since he was supposed to be on plavix & warfarin. He states he is going to see a new Dentist tomorrow for a consultation, therefore, things might be different. Advised that we do need to check the INR & once he gets a date for extractions to call back. Also, gave him our fax number for new Dentist.   Made pt aware of the lovenox/enoxaparin bridge he will need if he has to stop his warfarin. Also, he has enoxaparin 100mg  at home for a total of 12 syringes. Per previous conversation with , Pharmacist, pt can use Lovenox 100mg  BID or 150mg  QD.  Advised pt to call back so we can check his INR & let Thayer Ohm know what the new Dentist states and he verbalized understanding.

## 2021-05-26 NOTE — Telephone Encounter (Signed)
No need to go to preop box anymore - will route back to [place].

## 2021-05-26 NOTE — Telephone Encounter (Signed)
Call placed to pt regarding phone message below.  Left a message for pt to call back.  

## 2021-05-26 NOTE — Telephone Encounter (Signed)
Returned the call back to the pt.  He is aware that Dr. Clifton James recommends that he start the Plavix at 75 mg daily with no loading dose. Pt asked to refill it at [facility], rx sent.

## 2021-05-26 NOTE — Telephone Encounter (Signed)
Pt is returning a call to [place] from earlier this morning. Please advise pt further

## 2021-06-07 ENCOUNTER — Telehealth: Payer: Self-pay | Admitting: Cardiovascular Disease

## 2021-06-07 NOTE — Telephone Encounter (Signed)
   [facility] Pre-operative Risk Assessment    Patient Name: Andre Malone  DOB: 06/12/1951 MRN: [id number]  HEARTCARE STAFF:  - IMPORTANT!!!!!! Under Visit Info/Reason for Call, type in Other and utilize the format Clearance MM/DD/YY or Clearance TBD. Do not use dashes or single digits. - Please review there is not already an duplicate clearance open for this procedure. - If request is for dental extraction, please clarify the # of teeth to be extracted. - If the patient is currently at the dentist's office, call Pre-Op Callback Staff (MA/nurse) to input urgent request.  - If the patient is not currently in the dentist office, please route to the Pre-Op pool.  Request for surgical clearance:  What type of surgery is being performed?8  Extractions   When is this surgery scheduled? TBD   What type of clearance is required (medical clearance vs. Pharmacy clearance to hold med vs. Both)? Pharmacy Clearance   Are there any medications that need to be held prior to surgery and how long? Coumadin how long to hold before surgery    Practice name and name of physician performing surgery? Dr Raj Janus   What is the office phone number? [phone]   7.   What is the office fax number? [phone]  8.   Anesthesia type (None, local, MAC, general) ? IV sudation    Shana A Stovall 06/07/2021, 4:59 PM  _________________________________________________________________   (provider comments below)

## 2021-06-08 ENCOUNTER — Telehealth: Payer: Self-pay | Admitting: *Deleted

## 2021-06-08 NOTE — Telephone Encounter (Signed)
Prior Authorization for SPLIT NIGHT sent to [facility] via web portal..

## 2021-06-08 NOTE — Telephone Encounter (Signed)
Called pt he states that he does not have a date scheduled yet dentist was to schedule this last week for this week and has already started the bridging from the rx that he had from this procedure last year. He states that he is aware of the importance of doing this bridging but yet has started already. He stated that the dentist is in surgeries this morning and will call him this afternoon. He will call back or have dentist call back

## 2021-06-08 NOTE — Telephone Encounter (Signed)
Called pt and verified that he is taking plavix daily. Verbalizes that he is taking daily. He will be in on Tuesday for warfarin and Lovenox bridging along with all the other medication directions..the patient is very confused.

## 2021-06-08 NOTE — Telephone Encounter (Signed)
Appreciate pharm team's assistance with this patient. As below, patient had already begun bridging himself in anticipation of upcoming dental procedure but without a formal date yet. Laural Golden has have given him instructions on how to get back on his anticoagulation plan and will revisit the procedural bridging at his INR visit 10/12. Per discussion with Fabiola Backer (preop CMA today) who spoke with dental office, they did confirm they will require him to hold his Plavix. Marcelino Duster also spoke with the patient and clarified he is still taking his Plavix.   To simplify instructions for patient understanding, Laural Golden indicates they'll plan to give him all his instructions at his 10/12 INR visit which includes instructions for holding his Plavix as below, the Lovenox-Coumadin bridging, and his SBE prophylaxis.   Will remove this msg from preop box. I am grateful for the teamwork care of this patient today!  Phillippe Orlick PA-C

## 2021-06-08 NOTE — Telephone Encounter (Signed)
Kay-Dr Leonie Man' office will call back and let us know if/when she can get him in

## 2021-06-08 NOTE — Telephone Encounter (Signed)
Called patient.  Has taken 3 doses of enoxaparin (yesterday morning, last night, and this morning).  Has dental procedure scheduled for 10/18.  Advised patient to d/c enoxaparin at this time.  Restart warfarin tomorrow.  Has INR appointment on 10/12 where we will give him full bridging instructions.

## 2021-06-08 NOTE — Telephone Encounter (Signed)
When dental procedure is scheduled please send back to preop pool so we can schedule patient's Lovenox bridge

## 2021-06-08 NOTE — Telephone Encounter (Signed)
-----   Message from Gaynelle Cage, [place] sent at 05/13/2021  1:37 PM EDT -----  ----- Message ----- From: Elliot Cousin, RMA Sent: 05/12/2021   3:48 PM EDT To: Cv Div Sleep Studies  Pt needs a sleep study.  Thanks!

## 2021-06-08 NOTE — Telephone Encounter (Signed)
Called dentist Dr Leonie Man  s/w Joyce Gross she states that she could maybe/possibly get pt in on the 18th but definitely not any sooner than that Dr is going out of town the end of the week.

## 2021-06-08 NOTE — Telephone Encounter (Signed)
Forwarded to requesting provider with note that pt has already started bridging from rx without recent directions from [place]. Taking from rx that he had from last year

## 2021-06-08 NOTE — Telephone Encounter (Addendum)
   Patient Name: Andre Malone  DOB: 08/11/1951 MRN: [id number]  Primary Cardiologist: Verne Carrow, MD  Chart reviewed as part of pre-operative protocol coverage. We recently addressed a similar clearance on this patient for extractions. Notes indicate he was going to see a new dentist. The previous recommendations would still apply here:  - Patient is medically cleared to proceed with extractions. - If Plavix needs to be held: Dr. Clifton James feels it is OK to hold Plavix if necessary. We would recommend to minimize the amount of time off Plavix as much as possible and restart as soon as felt safe as the patient had recent stenting in 12/2020. (During recent phone notes with the patient, he was unaware he was supposed to be still taking this so we did ask him to restart 75mg  daily.) - Regarding anticoagulation, pharmacist reviewed chart and states: "Per office protocol, patient can hold warfarin for 5 days prior to procedure. Patient WILL need bridging with Lovenox (enoxaparin) around procedure. Patient should restart warfarin on the evening of procedure or day after, at discretion of procedure MD." Per phone note 05/26/21, the patient was made aware by Coumadin clinic to call their team once he gets a date for the extractions. I will route to our pharmacy team so they are aware of the updated clearance. - The patient does need SBE prophylaxis. I had reviewed this at our recent OV but on 05/16/21 it was cancelled off his MAR. PharmD 07/16/21 sent in an updated rx on 05/25/21 for amoxicillin amoxicillin 500mg  to take 4 tablets by mouth 30-60 minutes prior to dental work. I will route this to callback team to make sure to remind the patient to take this. This applies to ANY dental work including cleanings. OK to offer patient additional refills if needed for future dental work (I.e. #4 with 2 refills). Please tell patient to call our office when procedure is scheduled so pharmacy team can  schedule his Lovenox bridge.  Will otherwise route this bundled recommendation to requesting provider via Epic fax function. Please call with questions.  05/27/21, PA-C 06/08/2021, 11:12 AM

## 2021-06-10 ENCOUNTER — Ambulatory Visit (INDEPENDENT_AMBULATORY_CARE_PROVIDER_SITE_OTHER): Payer: Medicare HMO

## 2021-06-10 DIAGNOSIS — I480 Paroxysmal atrial fibrillation: Secondary | ICD-10-CM

## 2021-06-13 LAB — CUP PACEART REMOTE DEVICE CHECK
Battery Remaining Longevity: 7 mo
Battery Remaining Percentage: 9 %
Battery Voltage: 2.81 V
Brady Statistic AP VP Percent: 88 %
Brady Statistic AP VS Percent: 1 %
Brady Statistic AS VP Percent: 11 %
Brady Statistic AS VS Percent: 1 %
Brady Statistic RA Percent Paced: 89 %
Brady Statistic RV Percent Paced: 99 %
Date Time Interrogation Session: 20221007122026
Implantable Lead Implant Date: 20100913
Implantable Lead Implant Date: 20150114
Implantable Lead Location: 753859
Implantable Lead Location: 753860
Implantable Pulse Generator Implant Date: 20150114
Lead Channel Impedance Value: 300 Ohm
Lead Channel Impedance Value: 490 Ohm
Lead Channel Pacing Threshold Amplitude: 0.75 V
Lead Channel Pacing Threshold Amplitude: 1.25 V
Lead Channel Pacing Threshold Pulse Width: 0.5 ms
Lead Channel Pacing Threshold Pulse Width: 0.8 ms
Lead Channel Sensing Intrinsic Amplitude: 2.6 mV
Lead Channel Sensing Intrinsic Amplitude: 7.3 mV
Lead Channel Setting Pacing Amplitude: 2.5 V
Lead Channel Setting Pacing Amplitude: 2.5 V
Lead Channel Setting Pacing Pulse Width: 0.5 ms
Lead Channel Setting Sensing Sensitivity: 4 mV
Pulse Gen Model: 2240
Pulse Gen Serial Number: 7586540

## 2021-06-14 ENCOUNTER — Emergency Department (HOSPITAL_COMMUNITY): Payer: Medicare HMO

## 2021-06-14 ENCOUNTER — Other Ambulatory Visit: Payer: Self-pay

## 2021-06-14 ENCOUNTER — Encounter (HOSPITAL_COMMUNITY): Payer: Self-pay | Admitting: Emergency Medicine

## 2021-06-14 ENCOUNTER — Emergency Department (HOSPITAL_COMMUNITY)
Admission: EM | Admit: 2021-06-14 | Discharge: 2021-06-15 | Disposition: A | Discharge status: Home / Self Care | Payer: Medicare HMO | Attending: Emergency Medicine | Admitting: Emergency Medicine

## 2021-06-14 ENCOUNTER — Telehealth: Payer: Self-pay

## 2021-06-14 DIAGNOSIS — Z5321 Procedure and treatment not carried out due to patient leaving prior to being seen by health care provider: Secondary | ICD-10-CM | POA: Diagnosis not present

## 2021-06-14 DIAGNOSIS — R791 Abnormal coagulation profile: Secondary | ICD-10-CM | POA: Diagnosis not present

## 2021-06-14 DIAGNOSIS — R079 Chest pain, unspecified: Secondary | ICD-10-CM | POA: Diagnosis not present

## 2021-06-14 DIAGNOSIS — Z950 Presence of cardiac pacemaker: Secondary | ICD-10-CM | POA: Diagnosis not present

## 2021-06-14 DIAGNOSIS — Z743 Need for continuous supervision: Secondary | ICD-10-CM | POA: Diagnosis not present

## 2021-06-14 DIAGNOSIS — Z006 Encounter for examination for normal comparison and control in clinical research program: Secondary | ICD-10-CM

## 2021-06-14 DIAGNOSIS — R0789 Other chest pain: Secondary | ICD-10-CM | POA: Diagnosis not present

## 2021-06-14 LAB — BASIC METABOLIC PANEL
Anion gap: 11 (ref 5–15)
BUN: 22 mg/dL (ref 8–23)
CO2: 23 mmol/L (ref 22–32)
Calcium: 9.5 mg/dL (ref 8.9–10.3)
Chloride: 103 mmol/L (ref 98–111)
Creatinine, Ser: 1.31 mg/dL — ABNORMAL HIGH (ref 0.61–1.24)
GFR, Estimated: 59 mL/min — ABNORMAL LOW (ref >60)
Glucose, Bld: 153 mg/dL — ABNORMAL HIGH (ref 70–99)
Potassium: 3.8 mmol/L (ref 3.5–5.1)
Sodium: 137 mmol/L (ref 135–145)

## 2021-06-14 LAB — CBC
HCT: 37.1 % — ABNORMAL LOW (ref 39.0–52.0)
Hemoglobin: 11.9 g/dL — ABNORMAL LOW (ref 13.0–17.0)
MCH: 28.5 pg (ref 26.0–34.0)
MCHC: 32.1 g/dL (ref 30.0–36.0)
MCV: 89 fL (ref 80.0–100.0)
Platelets: 173 10*3/uL (ref 150–400)
RBC: 4.17 MIL/uL — ABNORMAL LOW (ref 4.22–5.81)
RDW: 15.6 % — ABNORMAL HIGH (ref 11.5–15.5)
WBC: 6.7 10*3/uL (ref 4.0–10.5)
nRBC: 0 % (ref 0.0–0.2)

## 2021-06-14 LAB — TROPONIN I (HIGH SENSITIVITY)
Troponin I (High Sensitivity): 14 ng/L (ref <18)
Troponin I (High Sensitivity): 17 ng/L (ref <18)

## 2021-06-14 LAB — PROTIME-INR
INR: 1.5 — ABNORMAL HIGH (ref 0.8–1.2)
Prothrombin Time: 17.7 seconds — ABNORMAL HIGH (ref 11.4–15.2)

## 2021-06-14 NOTE — Research (Signed)
Called patient to discuss [employer] Lpa trial with no answer. Message was left on voice mail asking patient to return my call

## 2021-06-14 NOTE — Telephone Encounter (Signed)
Called Dr. Monia Pouch office to follow up and confirm that the pt is having dental surgery on 06/21/21; spoke with Morrie Sheldon and she confirmed the pt is having oral surgery on 06/21/21 at 8am. Advised we will prepare patient for upcoming procedure since he requires special instructions with Lovenox bridge. She verbalized understanding.

## 2021-06-14 NOTE — Telephone Encounter (Signed)
Pt called left message on voicemail stating, he was still traveling home from [place] and is unsure due to traffic if he will be able to make his appointment this afternoon.  Pt states he is going to try to make his appt, but if unable will call back tomorrow for new appt date and time.    Upon further review of pt's chart and speaking to Laural Golden, pharmacist we are unsure if pt actually has a dental procedure date of 06/21/21 with Dr. Monia Pouch.  Last note of communication with their office Dr Monia Pouch was out of town, and the earliest they could possibly get pt in would be 10/17 or 10/18, but they were supposed to be checking with the Dentist and then calling us back with a procedure date once it was confirmed, we have not received a call back as of today.  Since pt was scheduled for Lovenox bridging today at 3:45pm, I called their office to see if there is an actual procedure date so we could bridge the pt and instruct them accordingly.  I had to leave a message on the voicemail to be returned to our clinic.  The message does state they are in the office and it is within their operating hours.  Will await call back to determine how to proceed.

## 2021-06-14 NOTE — ED Triage Notes (Signed)
Patient BIB [facility] from independent living, complaint of chest pain, had occurrence 1 week ago, resolved on its own then, back again today. Endorses having pacemaker.

## 2021-06-14 NOTE — ED Provider Notes (Signed)
Emergency Medicine Provider Triage Evaluation Note  Andre Malone , a 70 y.o. male  was evaluated in triage.  Pt complains of chest pain with exertion for the last 3-4 days. Hx stents.  Review of Systems  Positive: Chest pain Negative: Sob, ble swelling  Physical Exam  BP 112/67 (BP Location: Right Arm)   Pulse 60   Temp 98.9 F (37.2 C) (Oral)   Resp 16   SpO2 92%  Gen:   Awake, no distress   Resp:  Normal effort  MSK:   Moves extremities without difficulty  Other:  Heart with rrr  Medical Decision Making  Medically screening exam initiated at 6:26 PM.  Appropriate orders placed.  Andre Malone was informed that the remainder of the evaluation will be completed by another provider, this initial triage assessment does not replace that evaluation, and the importance of remaining in the ED until their evaluation is complete.     Rayne Du 06/14/21 1827    Maia Plan, MD 06/14/21 2216

## 2021-06-15 ENCOUNTER — Ambulatory Visit (INDEPENDENT_AMBULATORY_CARE_PROVIDER_SITE_OTHER): Payer: Medicare HMO

## 2021-06-15 DIAGNOSIS — I480 Paroxysmal atrial fibrillation: Secondary | ICD-10-CM

## 2021-06-15 DIAGNOSIS — Z952 Presence of prosthetic heart valve: Secondary | ICD-10-CM | POA: Diagnosis not present

## 2021-06-15 NOTE — Telephone Encounter (Signed)
Patient is scheduled for lab study on 08/02/21. Patient understands his sleep study will be done at [facility] sleep lab. Patient understands he will receive a sleep packet in a week or so. Patient understands to call if he does not receive the sleep packet in a timely manner.  Left detailed message on voicemail with date and time  and informed patient to call back to confirm or reschedule.

## 2021-06-15 NOTE — Telephone Encounter (Signed)
APPROVED:  Prior Authorization for SPLIT NIGHT sent to [facility] via  WEB. VALID UNTIL 12-11-21 AUTH # [id number].

## 2021-06-15 NOTE — Telephone Encounter (Signed)
Attempted to call to reschedule appt for Lovenox bridge.  LMOM TCB for appt.  Will need to be seen today or tomorrow for Lovenox bridging and instructions prior to dental procedure on 06/21/21.

## 2021-06-15 NOTE — ED Notes (Signed)
Pt left AMA °

## 2021-06-15 NOTE — Telephone Encounter (Signed)
Spoke with pt he states he is no longer having dental procedure secondary to cost.  Pt states he is still going to shop around for another oral surgeon to do this procedure.  Pt is aware he needs to notify us if he reschedules appt so we can coordinate Lovenox bridging. Pt states he went to ED yesterday when he returned to [facility] for CP, but ended up leaving AMA after being there over 12 hours. Pt states he thinks he has an infection in his chest or pleurisy, advised pt to call PCP for an appt today or return to ED to be evaluated. INR was checked at hospital will dose yesterday's INR result, see anticoagulation note in Epic.

## 2021-06-15 NOTE — Patient Instructions (Signed)
Description   Spoke with pt INR 1.5 at ED yesterday, advised pt to take 1.5 tablets today, then resume same dosage of Warfarin 1/2 tablet daily except 1 tablet on Mondays, Wednesdays and Fridays. Recheck INR in 1 week. Coumadin Clinic [phone].

## 2021-06-20 ENCOUNTER — Other Ambulatory Visit: Payer: Self-pay | Admitting: Nurse Practitioner

## 2021-06-20 DIAGNOSIS — E039 Hypothyroidism, unspecified: Secondary | ICD-10-CM

## 2021-06-20 DIAGNOSIS — I480 Paroxysmal atrial fibrillation: Secondary | ICD-10-CM

## 2021-06-20 NOTE — Addendum Note (Signed)
Addended by: Geralyn Flash D on: 06/20/2021 11:22 AM   Modules accepted: Level of Service

## 2021-06-20 NOTE — Progress Notes (Signed)
Remote pacemaker transmission.   

## 2021-06-21 ENCOUNTER — Other Ambulatory Visit: Payer: Self-pay

## 2021-06-21 ENCOUNTER — Ambulatory Visit (INDEPENDENT_AMBULATORY_CARE_PROVIDER_SITE_OTHER): Payer: Medicare HMO | Admitting: Nurse Practitioner

## 2021-06-21 ENCOUNTER — Encounter: Payer: Self-pay | Admitting: Nurse Practitioner

## 2021-06-21 VITALS — BP 118/67 | HR 67 | Temp 97.8°F | Resp 20 | Ht 66.0 in | Wt 234.0 lb

## 2021-06-21 DIAGNOSIS — Z7901 Long term (current) use of anticoagulants: Secondary | ICD-10-CM | POA: Diagnosis not present

## 2021-06-21 DIAGNOSIS — R072 Precordial pain: Secondary | ICD-10-CM

## 2021-06-21 LAB — COAGUCHEK XS/INR WAIVED
INR: 1.4 — ABNORMAL HIGH (ref 0.9–1.1)
Prothrombin Time: 16.9 s

## 2021-06-21 NOTE — Patient Instructions (Signed)
Nonspecific Chest Pain Chest pain can be caused by many different conditions. Some causes of chest pain can be life-threatening. These will require treatment right away. Serious causes of chest pain include: Heart attack. A tear in the body's main blood vessel. Redness and swelling (inflammation) around your heart. Blood clot in your lungs. Other causes of chest pain may not be so serious. These include: Heartburn. Anxiety or stress. Damage to bones or muscles in your chest. Lung infections. Chest pain can feel like: Pain or discomfort in your chest. Crushing, pressure, aching, or squeezing pain. Burning or tingling. Dull or sharp pain that is worse when you move, cough, or take a deep breath. Pain or discomfort that is also felt in your back, neck, jaw, shoulder, or arm, or pain that spreads to any of these areas. It is hard to know whether your pain is caused by something that is serious or something that is not so serious. So it is important to see your doctor right away if you have chest pain. Follow these instructions at home: Medicines Take over-the-counter and prescription medicines only as told by your doctor. If you were prescribed an antibiotic medicine, take it as told by your doctor. Do not stop taking the antibiotic even if you start to feel better. Lifestyle  Rest as told by your doctor. Do not use any products that contain nicotine or tobacco, such as cigarettes, e-cigarettes, and chewing tobacco. If you need help quitting, ask your doctor. Do not drink alcohol. Make lifestyle changes as told by your doctor. These may include: Getting regular exercise. Ask your doctor what activities are safe for you. Eating a heart-healthy diet. A diet and nutrition specialist (dietitian) can help you to learn healthy eating options. Staying at a healthy weight. Treating diabetes or high blood pressure, if needed. Lowering your stress. Activities such as yoga and relaxation techniques  can help. General instructions Pay attention to any changes in your symptoms. Tell your doctor about them or any new symptoms. Avoid any activities that cause chest pain. Keep all follow-up visits as told by your doctor. This is important. You may need more testing if your chest pain does not go away. Contact a doctor if: Your chest pain does not go away. You feel depressed. You have a fever. Get help right away if: Your chest pain is worse. You have a cough that gets worse, or you cough up blood. You have very bad (severe) pain in your belly (abdomen). You pass out (faint). You have either of these for no clear reason: Sudden chest discomfort. Sudden discomfort in your arms, back, neck, or jaw. You have shortness of breath at any time. You suddenly start to sweat, or your skin gets clammy. You feel sick to your stomach (nauseous). You throw up (vomit). You suddenly feel lightheaded or dizzy. You feel very weak or tired. Your heart starts to beat fast, or it feels like it is skipping beats. These symptoms may be an emergency. Do not wait to see if the symptoms will go away. Get medical help right away. Call your local emergency services (911 in the [place] Do not drive yourself to the hospital. Summary Chest pain can be caused by many different conditions. The cause may be serious and need treatment right away. If you have chest pain, see your doctor right away. Follow your doctor's instructions for taking medicines and making lifestyle changes. Keep all follow-up visits as told by your doctor. This includes visits for any further   testing if your chest pain does not go away. Be sure to know the signs that show that your condition has become worse. Get help right away if you have these symptoms. This information is not intended to replace advice given to you by your health care provider. Make sure you discuss any questions you have with your health care provider. Document Revised:  11/04/2020 Document Reviewed: 11/04/2020 Elsevier Patient Education  2022 Elsevier Inc.  

## 2021-06-21 NOTE — Progress Notes (Signed)
   Subjective:    Patient ID: Andre Malone, male    DOB: 18-Aug-1951, 70 y.o.   MRN: [id number]  Chief Complaint: Hospitalization Follow-up   HPI Patient went to hospital on 06/14/21 with chest pain and SOB. EKG was normal and all labs were normal. Except for his hgb was a little low at 11.9. tropnin were in normal range. INR was 1.5. he goes to coumadin clinic in [place]. He says he keeps having this feeling of burning sensation on left side of chest. Has some left arm pain. He actually did not get seen when he went to the ED by the doctor, because he got tired of seating there of 8 hours. Today he still feels that same. Has chest pain left jaw pain ans SOB. He has not seen his cardiologist, but tallked tro them over the phone.  EKG- normal pacing ryhthym  Review of Systems  Constitutional:  Positive for fatigue (not unusual).  Respiratory:  Positive for chest tightness and shortness of breath.   Cardiovascular:  Positive for chest pain (?). Negative for palpitations and leg swelling.      Objective:   Physical Exam Vitals reviewed.  Constitutional:      Appearance: Normal appearance. He is obese.  HENT:     Right Ear: Tympanic membrane normal.     Left Ear: Tympanic membrane normal.     Nose: Nose normal.  Cardiovascular:     Rate and Rhythm: Normal rate and regular rhythm.     Heart sounds: Normal heart sounds.  Pulmonary:     Effort: Pulmonary effort is normal.     Breath sounds: Normal breath sounds.  Skin:    General: Skin is warm.  Neurological:     General: No focal deficit present.     Mental Status: He is alert.  Psychiatric:        Mood and Affect: Mood normal.        Behavior: Behavior normal.    BP 118/67   Pulse 67   Temp 97.8 F (36.6 C) (Temporal)   Resp 20   Ht 5\' 6"  (1.676 m)   Wt 234 lb (106.1 kg)   SpO2 93%   BMI 37.77 kg/m   EKG- pacing rhythym- unchanged from previous     Assessment & Plan:  in today with chief  complaint of Hospitalization Follow-up   1. Precordial pain Needs to make an appointment with cardiology ASAP for follow up - EKG 12-Lead  2. Anticoagulated on Coumadin Needs INR check at coumadin clinic that he already goes to.   INR today 1.4-  patient will call coumadin clinic tomorrow with lab results- increase coumadin 5mg  daily except 2.5mg  on sat and sunday  The above assessment and management plan was discussed with the patient. The patient verbalized understanding of and has agreed to the management plan. Patient is aware to call the clinic if symptoms persist or worsen. Patient is aware when to return to the clinic for a follow-up visit. Patient educated on when it is appropriate to go to the emergency department.   [name] [name], FNP

## 2021-06-22 ENCOUNTER — Ambulatory Visit (INDEPENDENT_AMBULATORY_CARE_PROVIDER_SITE_OTHER): Payer: Medicare HMO

## 2021-06-22 DIAGNOSIS — Z952 Presence of prosthetic heart valve: Secondary | ICD-10-CM | POA: Diagnosis not present

## 2021-06-22 DIAGNOSIS — I480 Paroxysmal atrial fibrillation: Secondary | ICD-10-CM

## 2021-06-22 NOTE — Patient Instructions (Signed)
Description   Spoke with pt INR 1.4 at PCP yesterday, advised pt to take 1.5 tablets today, then start taking Warfarin 1 tablet daily except 1/2 tablet on Sundays, Tuesdays and Thursdays. Recheck INR in 1 week. Coumadin Clinic [phone].

## 2021-06-29 ENCOUNTER — Other Ambulatory Visit: Payer: Self-pay

## 2021-06-29 ENCOUNTER — Ambulatory Visit: Payer: Medicare HMO

## 2021-06-29 DIAGNOSIS — I480 Paroxysmal atrial fibrillation: Secondary | ICD-10-CM | POA: Diagnosis not present

## 2021-06-29 DIAGNOSIS — Z5181 Encounter for therapeutic drug level monitoring: Secondary | ICD-10-CM

## 2021-06-29 LAB — POCT INR: INR: 1.6 — AB (ref 2.0–3.0)

## 2021-06-29 NOTE — Patient Instructions (Signed)
Description   Take 1.5 tablets today and 1 tablet tomorrow and then continue taking Warfarin 1 tablet daily except 1/2 tablet on Sundays, Tuesdays and Thursdays. Recheck INR in 1 week. Coumadin Clinic [phone].

## 2021-07-01 DIAGNOSIS — H2523 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, morgagnian type, bilateral: Secondary | ICD-10-CM

## 2021-07-01 DIAGNOSIS — Z79891 Long term (current) use of opiate analgesic: Secondary | ICD-10-CM | POA: Insufficient documentation

## 2021-07-01 HISTORY — DX: 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, morgagnian type, bilateral: H25.23

## 2021-07-04 ENCOUNTER — Ambulatory Visit (INDEPENDENT_AMBULATORY_CARE_PROVIDER_SITE_OTHER): Payer: Medicare HMO

## 2021-07-04 VITALS — Ht 66.0 in | Wt 226.0 lb

## 2021-07-04 DIAGNOSIS — F339 Major depressive disorder, recurrent, unspecified: Secondary | ICD-10-CM | POA: Insufficient documentation

## 2021-07-04 DIAGNOSIS — E785 Hyperlipidemia, unspecified: Secondary | ICD-10-CM | POA: Insufficient documentation

## 2021-07-04 DIAGNOSIS — H53001 Unspecified amblyopia, right eye: Secondary | ICD-10-CM | POA: Insufficient documentation

## 2021-07-04 DIAGNOSIS — F1120 Opioid dependence, uncomplicated: Secondary | ICD-10-CM | POA: Insufficient documentation

## 2021-07-04 DIAGNOSIS — M25519 Pain in unspecified shoulder: Secondary | ICD-10-CM | POA: Insufficient documentation

## 2021-07-04 DIAGNOSIS — H2513 Age-related nuclear cataract, bilateral: Secondary | ICD-10-CM | POA: Insufficient documentation

## 2021-07-04 DIAGNOSIS — M5412 Radiculopathy, cervical region: Secondary | ICD-10-CM | POA: Insufficient documentation

## 2021-07-04 DIAGNOSIS — M7660 Achilles tendinitis, unspecified leg: Secondary | ICD-10-CM | POA: Insufficient documentation

## 2021-07-04 DIAGNOSIS — F331 Major depressive disorder, recurrent, moderate: Secondary | ICD-10-CM | POA: Insufficient documentation

## 2021-07-04 DIAGNOSIS — Z0000 Encounter for general adult medical examination without abnormal findings: Secondary | ICD-10-CM | POA: Diagnosis not present

## 2021-07-04 DIAGNOSIS — Z760 Encounter for issue of repeat prescription: Secondary | ICD-10-CM | POA: Insufficient documentation

## 2021-07-04 DIAGNOSIS — E1169 Type 2 diabetes mellitus with other specified complication: Secondary | ICD-10-CM | POA: Insufficient documentation

## 2021-07-04 NOTE — Patient Instructions (Signed)
Andre Malone , Thank you for taking time to come for your Medicare Wellness Visit. I appreciate your ongoing commitment to your health goals. Please review the following plan we discussed and let me know if I can assist you in the future.   Screening recommendations/referrals: Colonoscopy: Done through [place], 2017  Recommended yearly ophthalmology/optometry visit for glaucoma screening and checkup Recommended yearly dental visit for hygiene and checkup  Vaccinations: Influenza vaccine: Done 05/25/2021. Repeat annually  Pneumococcal vaccine: Done 08/24/2016 and 06/13/2018 Tdap vaccine: Done 12/23/2019 Repeat in 10 years  Shingles vaccine: Shingrix discussed. Please contact your pharmacy for coverage information.     Covid-19: Done 11/22/2019, 03/20/2020, 07/13/2020, 05/01/2021 and 05/30/2021.  Advanced directives: Please bring a copy of your health care power of attorney and living will to the office to be added to your chart at your convenience.   Conditions/risks identified: Aim for 30 minutes of exercise or brisk walking each day, drink 6-8 glasses of water and eat lots of fruits and vegetables.   Next appointment: Follow up in one year for your annual wellness visit. 2023.  Preventive Care 70 Years and Older, Male  Preventive care refers to lifestyle choices and visits with your health care provider that can promote health and wellness. What does preventive care include? A yearly physical exam. This is also called an annual well check. Dental exams once or twice a year. Routine eye exams. Ask your health care provider how often you should have your eyes checked. Personal lifestyle choices, including: Daily care of your teeth and gums. Regular physical activity. Eating a healthy diet. Avoiding tobacco and drug use. Limiting alcohol use. Practicing safe sex. Taking low doses of aspirin every day. Taking vitamin and mineral supplements as recommended by your health care provider. What  happens during an annual well check? The services and screenings done by your health care provider during your annual well check will depend on your age, overall health, lifestyle risk factors, and family history of disease. Counseling  Your health care provider may ask you questions about your: Alcohol use. Tobacco use. Drug use. Emotional well-being. Home and relationship well-being. Sexual activity. Eating habits. History of falls. Memory and ability to understand (cognition). Work and work Astronomer. Screening  You may have the following tests or measurements: Height, weight, and BMI. Blood pressure. Lipid and cholesterol levels. These may be checked every 5 years, or more frequently if you are over 60 years old. Skin check. Lung cancer screening. You may have this screening every year starting at age 28 if you have a 30-pack-year history of smoking and currently smoke or have quit within the past 15 years. Fecal occult blood test (FOBT) of the stool. You may have this test every year starting at age 9. Flexible sigmoidoscopy or colonoscopy. You may have a sigmoidoscopy every 5 years or a colonoscopy every 10 years starting at age 1. Prostate cancer screening. Recommendations will vary depending on your family history and other risks. Hepatitis C blood test. Hepatitis B blood test. Sexually transmitted disease (STD) testing. Diabetes screening. This is done by checking your blood sugar (glucose) after you have not eaten for a while (fasting). You may have this done every 1-3 years. Abdominal aortic aneurysm (AAA) screening. You may need this if you are a current or former smoker. Osteoporosis. You may be screened starting at age 21 if you are at high risk. Talk with your health care provider about your test results, treatment options, and if necessary, the need  for more tests. Vaccines  Your health care provider may recommend certain vaccines, such as: Influenza vaccine. This  is recommended every year. Tetanus, diphtheria, and acellular pertussis (Tdap, Td) vaccine. You may need a Td booster every 10 years. Zoster vaccine. You may need this after age 6. Pneumococcal 13-valent conjugate (PCV13) vaccine. One dose is recommended after age 16. Pneumococcal polysaccharide (PPSV23) vaccine. One dose is recommended after age 60. Talk to your health care provider about which screenings and vaccines you need and how often you need them. This information is not intended to replace advice given to you by your health care provider. Make sure you discuss any questions you have with your health care provider. Document Released: 09/17/2015 Document Revised: 05/10/2016 Document Reviewed: 06/22/2015 Elsevier Interactive Patient Education  2017 [place] Prevention in the Home Falls can cause injuries. They can happen to people of all ages. There are many things you can do to make your home safe and to help prevent falls. What can I do on the outside of my home? Regularly fix the edges of walkways and driveways and fix any cracks. Remove anything that might make you trip as you walk through a door, such as a raised step or threshold. Trim any bushes or trees on the path to your home. Use bright outdoor lighting. Clear any walking paths of anything that might make someone trip, such as rocks or tools. Regularly check to see if handrails are loose or broken. Make sure that both sides of any steps have handrails. Any raised decks and porches should have guardrails on the edges. Have any leaves, snow, or ice cleared regularly. Use sand or salt on walking paths during winter. Clean up any spills in your garage right away. This includes oil or grease spills. What can I do in the bathroom? Use night lights. Install grab bars by the toilet and in the tub and shower. Do not use towel bars as grab bars. Use non-skid mats or decals in the tub or shower. If you need to sit down  in the shower, use a plastic, non-slip stool. Keep the floor dry. Clean up any water that spills on the floor as soon as it happens. Remove soap buildup in the tub or shower regularly. Attach bath mats securely with double-sided non-slip rug tape. Do not have throw rugs and other things on the floor that can make you trip. What can I do in the bedroom? Use night lights. Make sure that you have a light by your bed that is easy to reach. Do not use any sheets or blankets that are too big for your bed. They should not hang down onto the floor. Have a firm chair that has side arms. You can use this for support while you get dressed. Do not have throw rugs and other things on the floor that can make you trip. What can I do in the kitchen? Clean up any spills right away. Avoid walking on wet floors. Keep items that you use a lot in easy-to-reach places. If you need to reach something above you, use a strong step stool that has a grab bar. Keep electrical cords out of the way. Do not use floor polish or wax that makes floors slippery. If you must use wax, use non-skid floor wax. Do not have throw rugs and other things on the floor that can make you trip. What can I do with my stairs? Do not leave any items on the stairs.  Make sure that there are handrails on both sides of the stairs and use them. Fix handrails that are broken or loose. Make sure that handrails are as long as the stairways. Check any carpeting to make sure that it is firmly attached to the stairs. Fix any carpet that is loose or worn. Avoid having throw rugs at the top or bottom of the stairs. If you do have throw rugs, attach them to the floor with carpet tape. Make sure that you have a light switch at the top of the stairs and the bottom of the stairs. If you do not have them, ask someone to add them for you. What else can I do to help prevent falls? Wear shoes that: Do not have high heels. Have rubber bottoms. Are comfortable  and fit you well. Are closed at the toe. Do not wear sandals. If you use a stepladder: Make sure that it is fully opened. Do not climb a closed stepladder. Make sure that both sides of the stepladder are locked into place. Ask someone to hold it for you, if possible. Clearly mark and make sure that you can see: Any grab bars or handrails. First and last steps. Where the edge of each step is. Use tools that help you move around (mobility aids) if they are needed. These include: Canes. Walkers. Scooters. Crutches. Turn on the lights when you go into a dark area. Replace any light bulbs as soon as they burn out. Set up your furniture so you have a clear path. Avoid moving your furniture around. If any of your floors are uneven, fix them. If there are any pets around you, be aware of where they are. Review your medicines with your doctor. Some medicines can make you feel dizzy. This can increase your chance of falling. Ask your doctor what other things that you can do to help prevent falls. This information is not intended to replace advice given to you by your health care provider. Make sure you discuss any questions you have with your health care provider. Document Released: 06/17/2009 Document Revised: 01/27/2016 Document Reviewed: 09/25/2014 Elsevier Interactive Patient Education  2017 [employer].
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
## 2021-07-11 ENCOUNTER — Other Ambulatory Visit: Payer: Self-pay

## 2021-07-11 ENCOUNTER — Ambulatory Visit (INDEPENDENT_AMBULATORY_CARE_PROVIDER_SITE_OTHER): Payer: Medicare HMO

## 2021-07-11 ENCOUNTER — Ambulatory Visit: Payer: Medicare HMO

## 2021-07-11 DIAGNOSIS — I480 Paroxysmal atrial fibrillation: Secondary | ICD-10-CM

## 2021-07-11 DIAGNOSIS — Z5181 Encounter for therapeutic drug level monitoring: Secondary | ICD-10-CM

## 2021-07-11 DIAGNOSIS — Z952 Presence of prosthetic heart valve: Secondary | ICD-10-CM

## 2021-07-11 LAB — POCT INR: INR: 3.9 — AB (ref 2.0–3.0)

## 2021-07-11 NOTE — Patient Instructions (Signed)
-   skip warfarin tonight, then  - continue taking Warfarin 1 tablet daily except 1/2 tablet on Sundays, Tuesdays and Thursdays.  - Recheck INR in 1 week.  TRY TO HAVE YOUR [place] ON A SCHEDULE [facility] [phone].

## 2021-07-13 LAB — CUP PACEART REMOTE DEVICE CHECK
Battery Remaining Longevity: 6 mo
Battery Remaining Percentage: 7 %
Battery Voltage: 2.8 V
Brady Statistic AP VP Percent: 87 %
Brady Statistic AP VS Percent: 1 %
Brady Statistic AS VP Percent: 11 %
Brady Statistic AS VS Percent: 1 %
Brady Statistic RA Percent Paced: 88 %
Brady Statistic RV Percent Paced: 98 %
Date Time Interrogation Session: 20221108172908
Implantable Lead Implant Date: 20100913
Implantable Lead Implant Date: 20150114
Implantable Lead Location: 753859
Implantable Lead Location: 753860
Implantable Pulse Generator Implant Date: 20150114
Lead Channel Impedance Value: 290 Ohm
Lead Channel Impedance Value: 510 Ohm
Lead Channel Pacing Threshold Amplitude: 0.75 V
Lead Channel Pacing Threshold Amplitude: 1.25 V
Lead Channel Pacing Threshold Pulse Width: 0.5 ms
Lead Channel Pacing Threshold Pulse Width: 0.8 ms
Lead Channel Sensing Intrinsic Amplitude: 3.6 mV
Lead Channel Sensing Intrinsic Amplitude: 7.4 mV
Lead Channel Setting Pacing Amplitude: 2.5 V
Lead Channel Setting Pacing Amplitude: 2.5 V
Lead Channel Setting Pacing Pulse Width: 0.5 ms
Lead Channel Setting Sensing Sensitivity: 4 mV
Pulse Gen Model: 2240
Pulse Gen Serial Number: 7586540

## 2021-07-15 NOTE — Progress Notes (Signed)
Remote pacemaker transmission.   

## 2021-07-15 NOTE — Addendum Note (Signed)
Addended by: Geralyn Flash D on: 07/15/2021 09:47 AM   Modules accepted: Level of Service

## 2021-07-17 ENCOUNTER — Other Ambulatory Visit: Payer: Self-pay | Admitting: Nurse Practitioner

## 2021-07-17 DIAGNOSIS — F3342 Major depressive disorder, recurrent, in full remission: Secondary | ICD-10-CM

## 2021-07-17 DIAGNOSIS — Z794 Long term (current) use of insulin: Secondary | ICD-10-CM

## 2021-07-17 DIAGNOSIS — E1140 Type 2 diabetes mellitus with diabetic neuropathy, unspecified: Secondary | ICD-10-CM

## 2021-07-21 DIAGNOSIS — M5412 Radiculopathy, cervical region: Secondary | ICD-10-CM | POA: Diagnosis not present

## 2021-07-21 DIAGNOSIS — G2581 Restless legs syndrome: Secondary | ICD-10-CM | POA: Diagnosis not present

## 2021-07-21 DIAGNOSIS — Z79891 Long term (current) use of opiate analgesic: Secondary | ICD-10-CM | POA: Diagnosis not present

## 2021-07-21 DIAGNOSIS — M545 Low back pain, unspecified: Secondary | ICD-10-CM | POA: Diagnosis not present

## 2021-07-21 DIAGNOSIS — R69 Illness, unspecified: Secondary | ICD-10-CM | POA: Diagnosis not present

## 2021-07-21 DIAGNOSIS — M25519 Pain in unspecified shoulder: Secondary | ICD-10-CM | POA: Diagnosis not present

## 2021-07-21 DIAGNOSIS — E1342 Other specified diabetes mellitus with diabetic polyneuropathy: Secondary | ICD-10-CM | POA: Diagnosis not present

## 2021-08-02 ENCOUNTER — Ambulatory Visit (HOSPITAL_BASED_OUTPATIENT_CLINIC_OR_DEPARTMENT_OTHER): Payer: Medicare HMO | Admitting: Cardiology

## 2021-08-10 NOTE — Progress Notes (Signed)
Monthly battery check already scheduled.  

## 2021-08-11 ENCOUNTER — Ambulatory Visit (INDEPENDENT_AMBULATORY_CARE_PROVIDER_SITE_OTHER): Payer: Medicare HMO

## 2021-08-11 DIAGNOSIS — I442 Atrioventricular block, complete: Secondary | ICD-10-CM | POA: Diagnosis not present

## 2021-08-12 LAB — CUP PACEART REMOTE DEVICE CHECK
Battery Remaining Longevity: 5 mo
Battery Remaining Percentage: 6 %
Battery Voltage: 2.77 V
Brady Statistic AP VP Percent: 86 %
Brady Statistic AP VS Percent: 1 %
Brady Statistic AS VP Percent: 12 %
Brady Statistic AS VS Percent: 1 %
Brady Statistic RA Percent Paced: 87 %
Brady Statistic RV Percent Paced: 98 %
Date Time Interrogation Session: 20221209115622
Implantable Lead Implant Date: 20100913
Implantable Lead Implant Date: 20150114
Implantable Lead Location: 753859
Implantable Lead Location: 753860
Implantable Pulse Generator Implant Date: 20150114
Lead Channel Impedance Value: 310 Ohm
Lead Channel Impedance Value: 480 Ohm
Lead Channel Pacing Threshold Amplitude: 0.75 V
Lead Channel Pacing Threshold Amplitude: 1.25 V
Lead Channel Pacing Threshold Pulse Width: 0.5 ms
Lead Channel Pacing Threshold Pulse Width: 0.8 ms
Lead Channel Sensing Intrinsic Amplitude: 2.6 mV
Lead Channel Sensing Intrinsic Amplitude: 6.6 mV
Lead Channel Setting Pacing Amplitude: 2.5 V
Lead Channel Setting Pacing Amplitude: 2.5 V
Lead Channel Setting Pacing Pulse Width: 0.5 ms
Lead Channel Setting Sensing Sensitivity: 4 mV
Pulse Gen Model: 2240
Pulse Gen Serial Number: 7586540
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
## 2021-08-15 ENCOUNTER — Other Ambulatory Visit: Payer: Self-pay

## 2021-08-15 ENCOUNTER — Ambulatory Visit: Payer: Medicare HMO | Admitting: Physician Assistant

## 2021-08-15 ENCOUNTER — Telehealth: Payer: Self-pay | Admitting: *Deleted

## 2021-08-15 DIAGNOSIS — R5383 Other fatigue: Secondary | ICD-10-CM

## 2021-08-15 DIAGNOSIS — I480 Paroxysmal atrial fibrillation: Secondary | ICD-10-CM

## 2021-08-15 DIAGNOSIS — I10 Essential (primary) hypertension: Secondary | ICD-10-CM

## 2021-08-15 DIAGNOSIS — I350 Nonrheumatic aortic (valve) stenosis: Secondary | ICD-10-CM

## 2021-08-15 DIAGNOSIS — Z8679 Personal history of other diseases of the circulatory system: Secondary | ICD-10-CM

## 2021-08-15 DIAGNOSIS — Z950 Presence of cardiac pacemaker: Secondary | ICD-10-CM

## 2021-08-15 DIAGNOSIS — E785 Hyperlipidemia, unspecified: Secondary | ICD-10-CM

## 2021-08-15 DIAGNOSIS — I2510 Atherosclerotic heart disease of native coronary artery without angina pectoris: Secondary | ICD-10-CM

## 2021-08-15 NOTE — Telephone Encounter (Signed)
Pt was scheduled for Dayna at 245 and CVRR at 330pm; pt missed Dayna's appt and was marked present for CVRR. Called the greeters desk to see if the pt was still here and there was no answer. Then I walked up to the front desk to see if pt was still here and did not see him in the waiting room and Registration Dagoberto Reef called the pt over the speaker and pt did not come to her desk. I checked the waiting room again and did not see him or get an answer from anyone by that name. Therefore, called the pt on his cell phone and there was no answer so left a message for him to call back to reschedule missed appt.

## 2021-08-16 ENCOUNTER — Other Ambulatory Visit: Payer: Self-pay | Admitting: Nurse Practitioner

## 2021-08-16 ENCOUNTER — Telehealth: Payer: Self-pay

## 2021-08-16 DIAGNOSIS — E1140 Type 2 diabetes mellitus with diabetic neuropathy, unspecified: Secondary | ICD-10-CM

## 2021-08-16 DIAGNOSIS — F3342 Major depressive disorder, recurrent, in full remission: Secondary | ICD-10-CM

## 2021-08-16 DIAGNOSIS — I152 Hypertension secondary to endocrine disorders: Secondary | ICD-10-CM

## 2021-08-16 DIAGNOSIS — Z794 Long term (current) use of insulin: Secondary | ICD-10-CM

## 2021-08-16 DIAGNOSIS — E1159 Type 2 diabetes mellitus with other circulatory complications: Secondary | ICD-10-CM

## 2021-08-16 NOTE — Telephone Encounter (Signed)
Letter has been sent to patient instructing them to call us if they are still interested in completing their sleep study. If we have not received a response from the patient within 30 days of this notice, the order will be cancelled and they will need to discuss the need for a sleep study at their next office visit.  ° °

## 2021-08-19 NOTE — Progress Notes (Signed)
Remote pacemaker transmission.   

## 2021-08-20 ENCOUNTER — Other Ambulatory Visit: Payer: Self-pay | Admitting: Nurse Practitioner

## 2021-08-20 DIAGNOSIS — F3342 Major depressive disorder, recurrent, in full remission: Secondary | ICD-10-CM

## 2021-09-09 ENCOUNTER — Encounter: Payer: Self-pay | Admitting: Nurse Practitioner

## 2021-09-09 ENCOUNTER — Telehealth: Payer: Self-pay | Admitting: Nurse Practitioner

## 2021-09-09 DIAGNOSIS — E1140 Type 2 diabetes mellitus with diabetic neuropathy, unspecified: Secondary | ICD-10-CM

## 2021-09-09 NOTE — Telephone Encounter (Signed)
Pt called back and is aware to schedule an apt for med refill. He said he would have to find a ride and call back to schedule.

## 2021-09-09 NOTE — Telephone Encounter (Signed)
MMM NTBS 30 days given 08/16/21 °

## 2021-09-09 NOTE — Telephone Encounter (Signed)
N/A Mailed letter.

## 2021-09-12 ENCOUNTER — Ambulatory Visit (INDEPENDENT_AMBULATORY_CARE_PROVIDER_SITE_OTHER): Payer: Medicare HMO

## 2021-09-12 DIAGNOSIS — I442 Atrioventricular block, complete: Secondary | ICD-10-CM

## 2021-09-13 LAB — CUP PACEART REMOTE DEVICE CHECK
Battery Remaining Longevity: 4 mo
Battery Remaining Percentage: 4 %
Battery Voltage: 2.74 V
Brady Statistic AP VP Percent: 86 %
Brady Statistic AP VS Percent: 1 %
Brady Statistic AS VP Percent: 11 %
Brady Statistic AS VS Percent: 1 %
Brady Statistic RA Percent Paced: 87 %
Brady Statistic RV Percent Paced: 98 %
Date Time Interrogation Session: 20230109075626
Implantable Lead Implant Date: 20100913
Implantable Lead Implant Date: 20150114
Implantable Lead Location: 753859
Implantable Lead Location: 753860
Implantable Pulse Generator Implant Date: 20150114
Lead Channel Impedance Value: 310 Ohm
Lead Channel Impedance Value: 490 Ohm
Lead Channel Pacing Threshold Amplitude: 0.75 V
Lead Channel Pacing Threshold Amplitude: 1.25 V
Lead Channel Pacing Threshold Pulse Width: 0.5 ms
Lead Channel Pacing Threshold Pulse Width: 0.8 ms
Lead Channel Sensing Intrinsic Amplitude: 2.7 mV
Lead Channel Sensing Intrinsic Amplitude: 7.2 mV
Lead Channel Setting Pacing Amplitude: 2.5 V
Lead Channel Setting Pacing Amplitude: 2.5 V
Lead Channel Setting Pacing Pulse Width: 0.5 ms
Lead Channel Setting Sensing Sensitivity: 4 mV
Pulse Gen Model: 2240
Pulse Gen Serial Number: 7586540

## 2021-09-15 ENCOUNTER — Other Ambulatory Visit: Payer: Self-pay | Admitting: Nurse Practitioner

## 2021-09-15 DIAGNOSIS — E1159 Type 2 diabetes mellitus with other circulatory complications: Secondary | ICD-10-CM

## 2021-09-15 DIAGNOSIS — I152 Hypertension secondary to endocrine disorders: Secondary | ICD-10-CM

## 2021-09-15 NOTE — Telephone Encounter (Signed)
MMM NTBS 30 days given 08/16/21

## 2021-09-16 NOTE — Telephone Encounter (Signed)
Left message for pt to call back and schedule med refill appt with MMM.

## 2021-09-19 ENCOUNTER — Other Ambulatory Visit: Payer: Self-pay | Admitting: Nurse Practitioner

## 2021-09-19 DIAGNOSIS — I480 Paroxysmal atrial fibrillation: Secondary | ICD-10-CM

## 2021-09-19 NOTE — Telephone Encounter (Signed)
Patient last seen here 06/21/21 and has also missed appt with Cardiology. Please review.

## 2021-09-21 NOTE — Progress Notes (Signed)
Remote pacemaker transmission.   

## 2021-09-21 NOTE — Addendum Note (Signed)
Addended by: Geralyn Flash D on: 09/21/2021 09:33 AM   Modules accepted: Level of Service

## 2021-09-22 ENCOUNTER — Telehealth: Payer: Self-pay | Admitting: *Deleted

## 2021-09-22 NOTE — Telephone Encounter (Signed)
Called pt since he is overdue for his INR monitoring. Spoke with pt and he states he is in [place] and will come next week. He stated he will call once he gets back in town. Asked pt if he has been taking the warfarin and he confirmed he has. Pt is aware to call back as soon as he can to set an appt since he is overdue (last seen in November). Advised him to call back on Monday to set an appt since he states he will be back in town on Saturday or Sunday.

## 2021-09-28 ENCOUNTER — Telehealth: Payer: Self-pay

## 2021-09-28 NOTE — Telephone Encounter (Signed)
Called [facility], spoke with Hurricane for Andre Pretty, NP regarding recent Warfarin refill on 09/19/21.  Pt's Warfarin was refilled for 90 tablets, which is a 90+ day supply, but pt is overdue for INR check in our clinic and we advised pt he needed an INR check before we could refill.  Wanted to know if they were now managing and monitoring pt's Warfarin, since they refilled it.  Andre Malone states they are not managing or monitoring pt's Warfarin, and that that refill was probably Malone in error since they are not following pt's INR.  Asked in the future if they would not refill Warfarin for this pt since he is non-compliant and the only way we can get him to the office for INR check is to wait to refill at [facility].  Last INR was on 07/11/22 INR 3.9, pt was instructed to return in 1 week, but we have not seen him since that OV.  Have documented multiple attempts to r/s pt in office: 11/16 pt out of town, stated he would call back to r/s 12/7 called, lm -ab 12/9-appt  12/12-ch 12/27-spoke with pt he has cold at present, has tested neg for Covid, but does not feel or sound well.  Pt WCB next week to schedule f/u appt/ewb 1/5 called to r/s. No answer, lm - ab On 09/22/21 phone note in Epic states we spoke with pt and he was in [place], but would be back in town next week.   Andre Malone stated understanding of pt's non compliance, as he misses appts in their office as well and will make Andre Hassell Done, NP aware of pt's lack of follow-up in our clinic and need for strict INR monitoring and Warfarin refills.

## 2021-10-13 ENCOUNTER — Ambulatory Visit (INDEPENDENT_AMBULATORY_CARE_PROVIDER_SITE_OTHER): Payer: Medicare HMO

## 2021-10-13 DIAGNOSIS — I442 Atrioventricular block, complete: Secondary | ICD-10-CM

## 2021-10-14 DIAGNOSIS — M25519 Pain in unspecified shoulder: Secondary | ICD-10-CM | POA: Diagnosis not present

## 2021-10-14 DIAGNOSIS — R69 Illness, unspecified: Secondary | ICD-10-CM | POA: Diagnosis not present

## 2021-10-14 DIAGNOSIS — E1342 Other specified diabetes mellitus with diabetic polyneuropathy: Secondary | ICD-10-CM | POA: Diagnosis not present

## 2021-10-14 DIAGNOSIS — Z79891 Long term (current) use of opiate analgesic: Secondary | ICD-10-CM | POA: Diagnosis not present

## 2021-10-14 DIAGNOSIS — M545 Low back pain, unspecified: Secondary | ICD-10-CM | POA: Diagnosis not present

## 2021-10-14 DIAGNOSIS — G2581 Restless legs syndrome: Secondary | ICD-10-CM | POA: Diagnosis not present

## 2021-10-14 DIAGNOSIS — M5412 Radiculopathy, cervical region: Secondary | ICD-10-CM | POA: Diagnosis not present

## 2021-10-15 LAB — CUP PACEART REMOTE DEVICE CHECK
Battery Remaining Longevity: 1 mo
Battery Remaining Percentage: 3 %
Battery Voltage: 2.69 V
Brady Statistic AP VP Percent: 84 %
Brady Statistic AP VS Percent: 1 %
Brady Statistic AS VP Percent: 13 %
Brady Statistic AS VS Percent: 1 %
Brady Statistic RA Percent Paced: 85 %
Brady Statistic RV Percent Paced: 98 %
Date Time Interrogation Session: 20230211021132
Implantable Lead Implant Date: 20100913
Implantable Lead Implant Date: 20150114
Implantable Lead Location: 753859
Implantable Lead Location: 753860
Implantable Pulse Generator Implant Date: 20150114
Lead Channel Impedance Value: 310 Ohm
Lead Channel Impedance Value: 510 Ohm
Lead Channel Pacing Threshold Amplitude: 0.75 V
Lead Channel Pacing Threshold Amplitude: 1.25 V
Lead Channel Pacing Threshold Pulse Width: 0.5 ms
Lead Channel Pacing Threshold Pulse Width: 0.8 ms
Lead Channel Sensing Intrinsic Amplitude: 10.1 mV
Lead Channel Sensing Intrinsic Amplitude: 2.3 mV
Lead Channel Setting Pacing Amplitude: 2.5 V
Lead Channel Setting Pacing Amplitude: 2.5 V
Lead Channel Setting Pacing Pulse Width: 0.5 ms
Lead Channel Setting Sensing Sensitivity: 4 mV
Pulse Gen Model: 2240
Pulse Gen Serial Number: 7586540

## 2021-10-18 NOTE — Progress Notes (Signed)
Remote pacemaker transmission.   

## 2021-10-18 NOTE — Addendum Note (Signed)
Addended by: Cheri Kearns A on: 10/18/2021 03:07 PM   Modules accepted: Level of Service

## 2021-10-20 ENCOUNTER — Ambulatory Visit (INDEPENDENT_AMBULATORY_CARE_PROVIDER_SITE_OTHER): Payer: Medicare HMO | Admitting: Nurse Practitioner

## 2021-10-20 ENCOUNTER — Encounter: Payer: Self-pay | Admitting: Nurse Practitioner

## 2021-10-20 VITALS — BP 110/78 | HR 60 | Temp 98.0°F | Resp 20 | Ht 66.0 in | Wt 222.0 lb

## 2021-10-20 DIAGNOSIS — I480 Paroxysmal atrial fibrillation: Secondary | ICD-10-CM | POA: Diagnosis not present

## 2021-10-20 DIAGNOSIS — I2510 Atherosclerotic heart disease of native coronary artery without angina pectoris: Secondary | ICD-10-CM | POA: Diagnosis not present

## 2021-10-20 DIAGNOSIS — E1159 Type 2 diabetes mellitus with other circulatory complications: Secondary | ICD-10-CM | POA: Diagnosis not present

## 2021-10-20 DIAGNOSIS — I359 Nonrheumatic aortic valve disorder, unspecified: Secondary | ICD-10-CM

## 2021-10-20 DIAGNOSIS — I214 Non-ST elevation (NSTEMI) myocardial infarction: Secondary | ICD-10-CM

## 2021-10-20 DIAGNOSIS — F5101 Primary insomnia: Secondary | ICD-10-CM

## 2021-10-20 DIAGNOSIS — Z794 Long term (current) use of insulin: Secondary | ICD-10-CM | POA: Diagnosis not present

## 2021-10-20 DIAGNOSIS — E1169 Type 2 diabetes mellitus with other specified complication: Secondary | ICD-10-CM

## 2021-10-20 DIAGNOSIS — I5023 Acute on chronic systolic (congestive) heart failure: Secondary | ICD-10-CM

## 2021-10-20 DIAGNOSIS — Z952 Presence of prosthetic heart valve: Secondary | ICD-10-CM

## 2021-10-20 DIAGNOSIS — I442 Atrioventricular block, complete: Secondary | ICD-10-CM | POA: Diagnosis not present

## 2021-10-20 DIAGNOSIS — E785 Hyperlipidemia, unspecified: Secondary | ICD-10-CM

## 2021-10-20 DIAGNOSIS — I7121 Aneurysm of the ascending aorta, without rupture: Secondary | ICD-10-CM | POA: Diagnosis not present

## 2021-10-20 DIAGNOSIS — E039 Hypothyroidism, unspecified: Secondary | ICD-10-CM | POA: Diagnosis not present

## 2021-10-20 DIAGNOSIS — F3342 Major depressive disorder, recurrent, in full remission: Secondary | ICD-10-CM

## 2021-10-20 DIAGNOSIS — E1140 Type 2 diabetes mellitus with diabetic neuropathy, unspecified: Secondary | ICD-10-CM

## 2021-10-20 DIAGNOSIS — E559 Vitamin D deficiency, unspecified: Secondary | ICD-10-CM

## 2021-10-20 DIAGNOSIS — G2581 Restless legs syndrome: Secondary | ICD-10-CM

## 2021-10-20 DIAGNOSIS — I152 Hypertension secondary to endocrine disorders: Secondary | ICD-10-CM | POA: Diagnosis not present

## 2021-10-20 DIAGNOSIS — N1831 Chronic kidney disease, stage 3a: Secondary | ICD-10-CM

## 2021-10-20 LAB — COAGUCHEK XS/INR WAIVED
INR: 4.9 — ABNORMAL HIGH (ref 0.9–1.1)
Prothrombin Time: 59.3 s

## 2021-10-20 LAB — BAYER DCA HB A1C WAIVED: HB A1C (BAYER DCA - WAIVED): 7.7 % — ABNORMAL HIGH (ref 4.8–5.6)

## 2021-10-20 MED ORDER — SERTRALINE HCL 100 MG PO TABS: 100.0000 mg | ORAL_TABLET | Freq: Every day | ORAL | 1 refills | Status: DC

## 2021-10-20 MED ORDER — SERTRALINE HCL 100 MG PO TABS: 100.0000 mg | ORAL_TABLET | Freq: Every day | ORAL | 0 refills | Status: DC

## 2021-10-20 MED ORDER — LEVOTHYROXINE SODIUM 100 MCG PO TABS: 100.0000 ug | ORAL_TABLET | Freq: Every day | ORAL | 1 refills | Status: DC

## 2021-10-20 MED ORDER — CLOPIDOGREL BISULFATE 75 MG PO TABS: 75.0000 mg | ORAL_TABLET | Freq: Every day | ORAL | 3 refills | Status: DC

## 2021-10-20 MED ORDER — AMLODIPINE BESYLATE 5 MG PO TABS: 2.5000 mg | ORAL_TABLET | Freq: Every day | ORAL | 3 refills | Status: DC

## 2021-10-20 MED ORDER — TRAZODONE HCL 100 MG PO TABS: 100.0000 mg | ORAL_TABLET | Freq: Every day | ORAL | 1 refills | Status: DC

## 2021-10-20 MED ORDER — WARFARIN SODIUM 5 MG PO TABS: 5.0000 mg | ORAL_TABLET | Freq: Every day | ORAL | 0 refills | Status: DC

## 2021-10-20 MED ORDER — BUPROPION HCL ER (XL) 150 MG PO TB24: 150.0000 mg | ORAL_TABLET | Freq: Every day | ORAL | 1 refills | Status: DC

## 2021-10-20 MED ORDER — METFORMIN HCL 1000 MG PO TABS: ORAL_TABLET | ORAL | 3 refills | Status: DC

## 2021-10-20 NOTE — Progress Notes (Signed)
Subjective:    Patient ID: Andre Malone, male    DOB: [birth year], 71 y.o.   MRN: [id number]  Chief Complaint: medical management of chronic issues     HPI:  Andre Malone is a 71 y.o. who identifies as a male who was assigned male at birth.   Social history: Lives with: by hisself Work history: retired   Scientist, forensic in today for follow up of the following chronic medical issues:  1. Acute on chronic systolic heart failure (HCC) 2. Paroxysmal atrial fibrillation ([place]) 3. NSTEMI (non-ST elevated myocardial infarction) ([place]) 4. Atrioventricular block, complete ([place]) 5. Atherosclerosis of native coronary artery of native heart without angina pectoris 6. Aortic valve disorder 7. Aneurysm of ascending aorta without rupture Patient has not seen cardiology in over a year. He has not been going to the coumadin clinic to do [facility] Says that he has still been taking his coumadin. He was in [place] for a couple of weeks .  8. Hypertension associated with diabetes ([place]) No c/o chest pain, sob or headache. Does not check blood pressure at home. BP Readings from Last 3 Encounters:  10/20/21 (!) 177/100  06/21/21 118/67  06/15/21 (!) 131/59     9. Hyperlipidemia associated with type 2 diabetes mellitus ([place]) Does not watch diet and does no dedicated exercise. Lab Results  Component Value Date   CHOL 172 02/07/2021   HDL 42 02/07/2021   LDLCALC 101 (H) 02/07/2021   TRIG 164 (H) 02/07/2021   CHOLHDL 4.1 02/07/2021     10. Hypothyroidism, unspecified type No problems that aware of. Lab Results  Component Value Date   TSH 2.680 05/12/2021     11. Type 2 diabetes mellitus with diabetic neuropathy, with long-term current use of insulin ([place]) He doe snot check his blood sugars at home. Does not watch diet Lab Results  Component Value Date   HGBA1C 6.6 (H) 12/05/2020     12. Stage 3a chronic kidney disease (HCC) No problems voiding Lab Results  Component Value Date    CREATININE 1.31 (H) 06/14/2021     13. Primary insomnia He use to be on trazadone and ran out of refills. He is not able to sleep wwell without meds.   14. Recurrent major depressive disorder, in full remission [facility]) He says his depression has worsened. He os currently on zoloft. Depression screen [facility] 2/9 10/20/2021 07/04/2021 12/22/2020  Decreased Interest 1 0 0  Down, Depressed, Hopeless 1 1 0  PHQ - 2 Score 2 1 0  Altered sleeping 1 - 3  Tired, decreased energy 2 - 3  Change in appetite 1 - 0  Feeling bad or failure about yourself  1 - 0  Trouble concentrating 0 - 0  Moving slowly or fidgety/restless 0 - 0  Suicidal thoughts 0 - 0  PHQ-9 Score 7 - 6  Difficult doing work/chores Somewhat difficult - Not difficult at all  Some recent data might be hidden     15. RLS (restless legs syndrome) Still having issues but trazadone hleps legs at night.  16. Vitamin D deficiency Is on daily d supplement daily  17. Morbid obesity ([place]) Weight is down 4 lbs Wt Readings from Last 3 Encounters:  10/20/21 222 lb (100.7 kg)  07/04/21 226 lb (102.5 kg)  06/21/21 234 lb (106.1 kg)   BMI Readings from Last 3 Encounters:  10/20/21 35.83 kg/m  07/04/21 36.48 kg/m  06/21/21 37.77 kg/m      New complaints: None today  Allergies  Allergen Reactions   Apresoline [Hydralazine] Nausea And Vomiting   Atorvastatin Other (See Comments)    achiness   Praluent [Alirocumab]     fatigue   Rosuvastatin     Myalgias on 5-40mg daily dosing   Ace Inhibitors Other (See Comments)    cough   Benadryl [Diphenhydramine Hcl] Other (See Comments)    Makes restless legs worse   Imdur [Isosorbide Dinitrate] Other (See Comments)    headache   Metoprolol Other (See Comments)    Kidney failure   Nsaids Other (See Comments)    REACTION: Currently taking Coumadin   Valsartan Other (See Comments)    REACTION:shuts down Kidney   Outpatient Encounter Medications as of 10/20/2021  Medication Sig    acetaminophen (TYLENOL) 500 MG tablet Take 2 tablets (1,000 mg total) by mouth every 6 (six) hours.   amLODipine (NORVASC) 5 MG tablet Take 0.5 tablets (2.5 mg total) by mouth daily.   carboxymethylcellulose (REFRESH PLUS) 0.5 % SOLN INSTILL 1 DROP IN BOTH EYES FOUR TIMES A DAY AS NEEDED   clopidogrel (PLAVIX) 75 MG tablet Take 1 tablet (75 mg total) by mouth daily with breakfast.   furosemide (LASIX) 40 MG tablet TAKE 1 TABLET (40 MG TOTAL) BY MOUTH DAILY. (NEEDS TO BE SEEN BEFORE NEXT REFILL)   lactase (LACTAID) 3000 units tablet Take 9,000 Units by mouth daily as needed (when consuming dairy products).   levothyroxine (SYNTHROID) 100 MCG tablet TAKE 1 TABLET BY MOUTH DAILY BEFORE BREAKFAST.   metFORMIN (GLUCOPHAGE) 1000 MG tablet TAKE 1 TABLET BY MOUTH 2 (TWO) TIMES DAILY WITH A MEAL. (NEEDS TO BE SEEN BEFORE NEXT REFILL)   Multiple Vitamin (MULTIVITAMIN WITH MINERALS) TABS tablet Take 1 tablet by mouth daily.   nitroGLYCERIN (NITROSTAT) 0.4 MG SL tablet Place 1 tablet (0.4 mg total) under the tongue every 5 (five) minutes as needed for chest pain (Do not give more than 3 SL tablets in 15 minutes.).   oxyCODONE (ROXICODONE) 15 MG immediate release tablet Take 15 mg by mouth every 6 (six) hours as needed for pain.   PFIZER-BIONT COVID-19 VAC-TRIS SUSP injection    polyethylene glycol (MIRALAX / GLYCOLAX) 17 g packet Take 17 g by mouth daily as needed for mild constipation.   sertraline (ZOLOFT) 100 MG tablet TAKE 1 TABLET (100 MG TOTAL) BY MOUTH DAILY. (NEEDS TO BE SEEN BEFORE NEXT REFILL)   traZODone (DESYREL) 100 MG tablet TAKE 1 TABLET BY MOUTH EVERYDAY AT BEDTIME   vitamin C (ASCORBIC ACID) 500 MG tablet Take 500 mg by mouth daily.   warfarin (COUMADIN) 5 MG tablet TAKE 1 TABLET (5 MG TOTAL) BY MOUTH DAILY.   No facility-administered encounter medications on file as of 10/20/2021.    Past Surgical History:  Procedure Laterality Date   AORTIC ROOT REPLACEMENT     AORTIC VALVE  REPLACEMENT     CARDIAC CATHETERIZATION     CARDIAC VALVE REPLACEMENT     CORONARY ANGIOGRAPHY N/A 12/06/2020   Procedure: CORONARY ANGIOGRAPHY;  Surgeon: Lorretta Harp, MD;  Location: [place] [facility];  Service: Cardiovascular;  Laterality: N/A;   CORONARY STENT INTERVENTION N/A 12/06/2020   Procedure: CORONARY STENT INTERVENTION;  Surgeon: Lorretta Harp, MD;  Location: [place] [facility];  Service: Cardiovascular;  Laterality: N/A;   LEAD REVISION N/A 09/17/2013   Procedure: LEAD REVISION;  Surgeon: Deboraha Sprang, MD;  Location: [facility] CATH LAB;  Service: Cardiovascular;  Laterality: N/A;   LEFT HEART CATHETERIZATION WITH CORONARY  ANGIOGRAM N/A 05/30/2013   Procedure: LEFT HEART CATHETERIZATION WITH CORONARY ANGIOGRAM;  Surgeon: Peter M Martinique, MD;  Location: [facility] CATH LAB;  Service: Cardiovascular;  Laterality: N/A;   PACEMAKER INSERTION  05/17/2009; 09/17/2013   STJ dual chamber pacemaker implanted for CHB; RV lead revision and generator change 09/17/2013 by Dr Caryl Comes for ventricular lead malfunction   Right elbow surgery     Subxiphoid window     TONSILLECTOMY     TOTAL HIP ARTHROPLASTY  02/27/2012   Procedure: TOTAL HIP ARTHROPLASTY - left  Surgeon: Garald Balding, MD;  Location: [place];  Service: Orthopedics;  Laterality: Left;    Family History  Problem Relation Age of Onset   Heart disease Father        Deceased   Alcohol abuse Father    Other Mother        Deceased at young age   Stroke Mother        after delivery   Heart attack Brother    Other Brother        MVA   Heart disease Paternal Grandfather    Healthy Son    Healthy Daughter       Controlled substance contract: n/a     Review of Systems  Constitutional:  Negative for diaphoresis.  Eyes:  Negative for pain.  Respiratory:  Negative for shortness of breath.   Cardiovascular:  Negative for chest pain, palpitations and leg swelling.  Gastrointestinal:  Negative for abdominal pain.  Endocrine: Negative for  polydipsia.  Skin:  Negative for rash.  Neurological:  Negative for dizziness, weakness and headaches.  Hematological:  Does not bruise/bleed easily.  All other systems reviewed and are negative.     Objective:   Physical Exam Vitals and nursing note reviewed.  Constitutional:      Appearance: Normal appearance. He is well-developed.  HENT:     Head: Normocephalic.     Nose: Nose normal.     Mouth/Throat:     Mouth: Mucous membranes are moist.     Pharynx: Oropharynx is clear.  Eyes:     Pupils: Pupils are equal, round, and reactive to light.  Neck:     Thyroid: No thyroid mass or thyromegaly.     Vascular: No carotid bruit or JVD.     Trachea: Phonation normal.  Cardiovascular:     Rate and Rhythm: Normal rate and regular rhythm.     Comments: Artificial heart valve click audible Pulmonary:     Effort: Pulmonary effort is normal. No respiratory distress.     Breath sounds: Normal breath sounds.  Abdominal:     General: Bowel sounds are normal.     Palpations: Abdomen is soft.     Tenderness: There is no abdominal tenderness.  Musculoskeletal:        General: Normal range of motion.     Cervical back: Normal range of motion and neck supple.  Lymphadenopathy:     Cervical: No cervical adenopathy.  Skin:    General: Skin is warm and dry.  Neurological:     Mental Status: He is alert and oriented to person, place, and time.  Psychiatric:        Behavior: Behavior normal.        Thought Content: Thought content normal.        Judgment: Judgment normal.   BP 110/78 (BP Location: Right Arm, Patient Position: Sitting, Cuff Size: Normal) Comment (BP Location): right arm   Pulse 60  Temp 98 F (36.7 C) (Temporal)    Resp 20    Ht _0  (1.676 m)    Wt 222 lb (100.7 kg)    SpO2 94%    BMI 35.83 kg/m   HGBA1c 7.7%  INR 5.0     Assessment & Plan:  Andre Malone comes in today with chief complaint of Medical Management of Chronic Issues   Diagnosis and orders  addressed:  1. Acute on chronic systolic heart failure (HCC) 2. Paroxysmal atrial fibrillation (HCC) - warfarin (COUMADIN) 5 MG tablet; Take 1 tablet (5 mg total) by mouth daily.  Dispense: 90 tablet; Refill: 0 Hold coumadin dose for the next 3 days- back on 0.25 on Sunday and Monday- see [place] coumadin clinic on Monday for recheck.  3. NSTEMI (non-ST elevated myocardial infarction) ([place]) 4. Atrioventricular block, complete ([place]) 5. Atherosclerosis of native coronary artery of native heart without angina pectoris  clopidogrel (PLAVIX) 75 MG tablet; Take 1 tablet (75 mg total) by mouth daily with breakfast.  Dispense: 90 tablet; Refill: 3  6. Aortic valve disorder - CoaguChek XS/INR Waived  7. Aneurysm of ascending aorta without rupture Pleas keep follow up with cardiology Make sure keeps appointments at coumadin clinic  8. Hypertension associated with diabetes ([place]) Low sodium diet  - CBC with Differential/Platelet - CMP14+EGFR - amLODipine (NORVASC) 5 MG tablet; Take 0.5 tablets (2.5 mg total) by mouth daily.  Dispense: 45 tablet; Refill: 3  9. Hyperlipidemia associated with type 2 diabetes mellitus (HCC) Low fat diet - Lipid panel  10. Hypothyroidism, unspecified type Labs pending - Thyroid Panel With TSH - levothyroxine (SYNTHROID) 100 MCG tablet; Take 1 tablet (100 mcg total) by mouth daily before breakfast.  Dispense: 90 tablet; Refill: 1  11. Type 2 diabetes mellitus with diabetic neuropathy, with long-term current use of insulin (HCC) Continue to watch carbs in diet - Bayer DCA Hb A1c Waived - metFORMIN (GLUCOPHAGE) 1000 MG tablet; TAKE 1 TABLET BY MOUTH 2 (TWO) TIMES DAILY WITH A MEAL. (NEEDS TO BE SEEN BEFORE NEXT REFILL)  Dispense: 60 tablet; Refill: 3  12. Stage 3a chronic kidney disease ([place]) Labs pending  13. Primary insomnia Bedtime routine - traZODone (DESYREL) 100 MG tablet; Take 1 tablet (100 mg total) by mouth at bedtime.  Dispense: 90 tablet;  Refill: 1  14. Recurrent major depressive disorder, in full remission ([place]) Added wellbutrin - sertraline (ZOLOFT) 100 MG tablet; Take 1 tablet (100 mg total) by mouth daily. (NEEDS TO BE SEEN BEFORE NEXT REFILL)  Dispense: 90 tablet; Refill: 1 - Wellbutrin 150XL Take 1 tablet daily Dispense 90; 1 refill  15. RLS (restless legs syndrome) Keep legs warn at night  16. Vitamin D deficiency Continue daily vitamin d supplement  17. Morbid obesity ([place]) Discussed diet and exercise for person with BMI >25 Will recheck weight in 3-6 months    Labs pending Health Maintenance reviewed Diet and exercise encouraged  Follow up plan: 3 months   [name] Hassell Done, FNP

## 2021-10-21 ENCOUNTER — Telehealth: Payer: Self-pay

## 2021-10-21 LAB — CBC WITH DIFFERENTIAL/PLATELET
Basophils Absolute: 0.1 10*3/uL (ref 0.0–0.2)
Basos: 1 %
EOS (ABSOLUTE): 0.2 10*3/uL (ref 0.0–0.4)
Eos: 2 %
Hematocrit: 42.6 % (ref 37.5–51.0)
Hemoglobin: 13.7 g/dL (ref 13.0–17.7)
Immature Grans (Abs): 0 10*3/uL (ref 0.0–0.1)
Immature Granulocytes: 0 %
Lymphocytes Absolute: 1 10*3/uL (ref 0.7–3.1)
Lymphs: 11 %
MCH: 28.1 pg (ref 26.6–33.0)
MCHC: 32.2 g/dL (ref 31.5–35.7)
MCV: 87 fL (ref 79–97)
Monocytes Absolute: 0.8 10*3/uL (ref 0.1–0.9)
Monocytes: 9 %
Neutrophils Absolute: 7.3 10*3/uL — ABNORMAL HIGH (ref 1.4–7.0)
Neutrophils: 77 %
Platelets: 213 10*3/uL (ref 150–450)
RBC: 4.88 x10E6/uL (ref 4.14–5.80)
RDW: 15.5 % — ABNORMAL HIGH (ref 11.6–15.4)
WBC: 9.5 10*3/uL (ref 3.4–10.8)

## 2021-10-21 LAB — CMP14+EGFR
ALT: 11 IU/L (ref 0–44)
AST: 23 IU/L (ref 0–40)
Albumin/Globulin Ratio: 1.6 (ref 1.2–2.2)
Albumin: 4.6 g/dL (ref 3.8–4.8)
Alkaline Phosphatase: 89 IU/L (ref 44–121)
BUN/Creatinine Ratio: 16 (ref 10–24)
BUN: 22 mg/dL (ref 8–27)
Bilirubin Total: <0.2 mg/dL (ref 0.0–1.2)
CO2: 20 mmol/L (ref 20–29)
Calcium: 9.6 mg/dL (ref 8.6–10.2)
Chloride: 99 mmol/L (ref 96–106)
Creatinine, Ser: 1.41 mg/dL — ABNORMAL HIGH (ref 0.76–1.27)
Globulin, Total: 2.9 g/dL (ref 1.5–4.5)
Glucose: 160 mg/dL — ABNORMAL HIGH (ref 70–99)
Potassium: 3.7 mmol/L (ref 3.5–5.2)
Sodium: 139 mmol/L (ref 134–144)
Total Protein: 7.5 g/dL (ref 6.0–8.5)
eGFR: 54 mL/min/{1.73_m2} — ABNORMAL LOW (ref >59)

## 2021-10-21 LAB — THYROID PANEL WITH TSH
Free Thyroxine Index: 2.3 (ref 1.2–4.9)
T3 Uptake Ratio: 29 % (ref 24–39)
T4, Total: 7.9 ug/dL (ref 4.5–12.0)
TSH: 8.8 u[IU]/mL — ABNORMAL HIGH (ref 0.450–4.500)

## 2021-10-21 LAB — LIPID PANEL
Chol/HDL Ratio: 7.6 ratio — ABNORMAL HIGH (ref 0.0–5.0)
Cholesterol, Total: 257 mg/dL — ABNORMAL HIGH (ref 100–199)
HDL: 34 mg/dL — ABNORMAL LOW (ref >39)
LDL Chol Calc (NIH): 148 mg/dL — ABNORMAL HIGH (ref 0–99)
Triglycerides: 399 mg/dL — ABNORMAL HIGH (ref 0–149)
VLDL Cholesterol Cal: 75 mg/dL — ABNORMAL HIGH (ref 5–40)

## 2021-10-21 MED ORDER — LEVOTHYROXINE SODIUM 112 MCG PO TABS: 112.0000 ug | ORAL_TABLET | Freq: Every day | ORAL | 3 refills | Status: AC

## 2021-10-21 NOTE — Telephone Encounter (Signed)
Mandy from [facility] called after hours and left a voicemail.  She states they checked pt's INR yesterday 10/20/21 since he is non-compliant and has not had INR checked in a while.  INR was 5.0, they instructed pt to hold Warfarin x 3 dosages then take 2.5mg  on Sunday and Monday and recheck INR on Monday 10/24/21.   Attempted to call pt to schedule INR check in Coumadin Clinic, had to leave voice mail message for pt to call back to schedule follow-up appt on Monday in our clinic.  Will await call back.

## 2021-10-21 NOTE — Addendum Note (Signed)
Addended by: Bennie Pierini on: 10/21/2021 01:49 PM   Modules accepted: Orders

## 2021-10-21 NOTE — Telephone Encounter (Signed)
Pt returned call, INR appt scheduled for 2/20.

## 2021-10-24 ENCOUNTER — Ambulatory Visit: Payer: Medicare HMO

## 2021-10-24 ENCOUNTER — Other Ambulatory Visit: Payer: Self-pay

## 2021-10-24 DIAGNOSIS — Z952 Presence of prosthetic heart valve: Secondary | ICD-10-CM

## 2021-10-24 DIAGNOSIS — I480 Paroxysmal atrial fibrillation: Secondary | ICD-10-CM | POA: Diagnosis not present

## 2021-10-24 LAB — POCT INR: INR: 1.4 — AB (ref 2.0–3.0)

## 2021-10-24 NOTE — Patient Instructions (Signed)
Description   Start taking 1/2 tablet (2.5mg ) daily except 1 tablet (5mg ) on Mondays, Wednesdays and Fridays.  Recheck in 10 days. Coumadin Clinic [phone].

## 2021-11-03 ENCOUNTER — Ambulatory Visit: Payer: Medicare HMO

## 2021-11-03 ENCOUNTER — Other Ambulatory Visit: Payer: Self-pay

## 2021-11-03 DIAGNOSIS — I480 Paroxysmal atrial fibrillation: Secondary | ICD-10-CM | POA: Diagnosis not present

## 2021-11-03 DIAGNOSIS — Z5181 Encounter for therapeutic drug level monitoring: Secondary | ICD-10-CM | POA: Diagnosis not present

## 2021-11-03 LAB — POCT INR: INR: 2.2 (ref 2.0–3.0)

## 2021-11-03 NOTE — Patient Instructions (Signed)
Description   ?Continue taking 1/2 tablet (2.5mg ) daily except 1 tablet (5mg ) on Mondays, Wednesdays and Fridays. Recheck in 2 weeks. ?Coumadin Clinic [phone].  ?  ?   ?

## 2021-11-14 ENCOUNTER — Ambulatory Visit (INDEPENDENT_AMBULATORY_CARE_PROVIDER_SITE_OTHER): Payer: Medicare HMO

## 2021-11-14 DIAGNOSIS — I442 Atrioventricular block, complete: Secondary | ICD-10-CM | POA: Diagnosis not present

## 2021-11-15 LAB — CUP PACEART REMOTE DEVICE CHECK
Battery Remaining Longevity: 1 mo
Battery Remaining Percentage: 1 %
Battery Voltage: 2.65 V
Brady Statistic AP VP Percent: 85 %
Brady Statistic AP VS Percent: 1 %
Brady Statistic AS VP Percent: 13 %
Brady Statistic AS VS Percent: 1 %
Brady Statistic RA Percent Paced: 85 %
Brady Statistic RV Percent Paced: 98 %
Date Time Interrogation Session: 20230313152533
Implantable Lead Implant Date: 20100913
Implantable Lead Implant Date: 20150114
Implantable Lead Location: 753859
Implantable Lead Location: 753860
Implantable Pulse Generator Implant Date: 20150114
Lead Channel Impedance Value: 330 Ohm
Lead Channel Impedance Value: 490 Ohm
Lead Channel Pacing Threshold Amplitude: 0.75 V
Lead Channel Pacing Threshold Amplitude: 1.25 V
Lead Channel Pacing Threshold Pulse Width: 0.5 ms
Lead Channel Pacing Threshold Pulse Width: 0.8 ms
Lead Channel Sensing Intrinsic Amplitude: 2.1 mV
Lead Channel Sensing Intrinsic Amplitude: 9.1 mV
Lead Channel Setting Pacing Amplitude: 2.5 V
Lead Channel Setting Pacing Amplitude: 2.5 V
Lead Channel Setting Pacing Pulse Width: 0.5 ms
Lead Channel Setting Sensing Sensitivity: 4 mV
Pulse Gen Model: 2240
Pulse Gen Serial Number: 7586540

## 2021-11-17 ENCOUNTER — Other Ambulatory Visit: Payer: Self-pay | Admitting: Nurse Practitioner

## 2021-11-24 ENCOUNTER — Other Ambulatory Visit: Payer: Self-pay | Admitting: Nurse Practitioner

## 2021-11-24 DIAGNOSIS — E1159 Type 2 diabetes mellitus with other circulatory complications: Secondary | ICD-10-CM

## 2021-11-29 NOTE — Progress Notes (Signed)
Remote pacemaker transmission.   

## 2021-12-15 ENCOUNTER — Ambulatory Visit (INDEPENDENT_AMBULATORY_CARE_PROVIDER_SITE_OTHER): Payer: Medicare HMO

## 2021-12-15 DIAGNOSIS — I442 Atrioventricular block, complete: Secondary | ICD-10-CM

## 2021-12-19 LAB — CUP PACEART REMOTE DEVICE CHECK
Battery Remaining Longevity: 1 mo
Battery Remaining Percentage: 0.5 %
Battery Voltage: 2.62 V
Brady Statistic AP VP Percent: 84 %
Brady Statistic AP VS Percent: 1 %
Brady Statistic AS VP Percent: 14 %
Brady Statistic AS VS Percent: 1 %
Brady Statistic RA Percent Paced: 85 %
Brady Statistic RV Percent Paced: 98 %
Date Time Interrogation Session: 20230415113811
Implantable Lead Implant Date: 20100913
Implantable Lead Implant Date: 20150114
Implantable Lead Location: 753859
Implantable Lead Location: 753860
Implantable Pulse Generator Implant Date: 20150114
Lead Channel Impedance Value: 330 Ohm
Lead Channel Impedance Value: 540 Ohm
Lead Channel Pacing Threshold Amplitude: 0.75 V
Lead Channel Pacing Threshold Amplitude: 1.25 V
Lead Channel Pacing Threshold Pulse Width: 0.5 ms
Lead Channel Pacing Threshold Pulse Width: 0.8 ms
Lead Channel Sensing Intrinsic Amplitude: 2.8 mV
Lead Channel Sensing Intrinsic Amplitude: 7.6 mV
Lead Channel Setting Pacing Amplitude: 2.5 V
Lead Channel Setting Pacing Amplitude: 2.5 V
Lead Channel Setting Pacing Pulse Width: 0.5 ms
Lead Channel Setting Sensing Sensitivity: 4 mV
Pulse Gen Model: 2240
Pulse Gen Serial Number: 7586540

## 2021-12-30 NOTE — Addendum Note (Signed)
Addended by: Geralyn Flash D on: 12/30/2021 02:54 PM ? ? Modules accepted: Level of Service ? ?

## 2021-12-30 NOTE — Progress Notes (Signed)
Remote pacemaker transmission.   

## 2022-01-02 ENCOUNTER — Telehealth: Payer: Self-pay

## 2022-01-02 NOTE — Telephone Encounter (Signed)
Pacemaker has reached ERI. Attempted to contact patient to advise. No answer, LMTCB. ? ?Patient also has recall in. ?

## 2022-01-03 NOTE — Telephone Encounter (Signed)
2nd attempt to contact. No answer, LMTCB. 

## 2022-01-03 NOTE — Telephone Encounter (Signed)
Detailed mychart message sent to patient to advise on 01/03/22.  ?

## 2022-01-10 NOTE — Telephone Encounter (Signed)
Pt aware/agreeable to scheduled f/u visit to discuss ERI. ? ?

## 2022-01-16 ENCOUNTER — Ambulatory Visit (INDEPENDENT_AMBULATORY_CARE_PROVIDER_SITE_OTHER): Payer: Medicare HMO

## 2022-01-16 ENCOUNTER — Ambulatory Visit: Payer: Medicare HMO | Admitting: *Deleted

## 2022-01-16 DIAGNOSIS — I442 Atrioventricular block, complete: Secondary | ICD-10-CM

## 2022-01-16 DIAGNOSIS — I480 Paroxysmal atrial fibrillation: Secondary | ICD-10-CM

## 2022-01-16 DIAGNOSIS — Z952 Presence of prosthetic heart valve: Secondary | ICD-10-CM

## 2022-01-16 DIAGNOSIS — Z5181 Encounter for therapeutic drug level monitoring: Secondary | ICD-10-CM

## 2022-01-16 LAB — POCT INR: INR: 2.4 (ref 2.0–3.0)

## 2022-01-16 NOTE — Patient Instructions (Signed)
Description   ?Continue taking 1/2 tablet (2.5mg ) daily except 1 tablet (5mg ) on Mondays, Wednesdays and Fridays. Recheck in 4 weeks. PLEASE ADHERE TO APPOINTMENTS. Coumadin Clinic [phone].  ?  ?  ?

## 2022-01-17 LAB — CUP PACEART REMOTE DEVICE CHECK
Battery Remaining Longevity: 0 mo
Battery Voltage: 2.59 V
Brady Statistic AP VP Percent: 83 %
Brady Statistic AP VS Percent: 1 %
Brady Statistic AS VP Percent: 15 %
Brady Statistic AS VS Percent: 1 %
Brady Statistic RA Percent Paced: 84 %
Brady Statistic RV Percent Paced: 98 %
Date Time Interrogation Session: 20230513175608
Implantable Lead Implant Date: 20100913
Implantable Lead Implant Date: 20150114
Implantable Lead Location: 753859
Implantable Lead Location: 753860
Implantable Pulse Generator Implant Date: 20150114
Lead Channel Impedance Value: 330 Ohm
Lead Channel Impedance Value: 510 Ohm
Lead Channel Pacing Threshold Amplitude: 0.75 V
Lead Channel Pacing Threshold Amplitude: 1.25 V
Lead Channel Pacing Threshold Pulse Width: 0.5 ms
Lead Channel Pacing Threshold Pulse Width: 0.8 ms
Lead Channel Sensing Intrinsic Amplitude: 2.2 mV
Lead Channel Sensing Intrinsic Amplitude: 4.2 mV
Lead Channel Setting Pacing Amplitude: 2.5 V
Lead Channel Setting Pacing Amplitude: 2.5 V
Lead Channel Setting Pacing Pulse Width: 0.5 ms
Lead Channel Setting Sensing Sensitivity: 4 mV
Pulse Gen Model: 2240
Pulse Gen Serial Number: 7586540

## 2022-01-18 ENCOUNTER — Encounter: Payer: Self-pay | Admitting: Nurse Practitioner

## 2022-01-18 ENCOUNTER — Ambulatory Visit (INDEPENDENT_AMBULATORY_CARE_PROVIDER_SITE_OTHER): Payer: Medicare HMO | Admitting: Nurse Practitioner

## 2022-01-18 VITALS — BP 126/67 | HR 55 | Temp 97.6°F | Resp 20 | Ht 66.0 in | Wt 212.0 lb

## 2022-01-18 DIAGNOSIS — E1140 Type 2 diabetes mellitus with diabetic neuropathy, unspecified: Secondary | ICD-10-CM | POA: Diagnosis not present

## 2022-01-18 DIAGNOSIS — M25512 Pain in left shoulder: Secondary | ICD-10-CM | POA: Diagnosis not present

## 2022-01-18 DIAGNOSIS — I442 Atrioventricular block, complete: Secondary | ICD-10-CM

## 2022-01-18 DIAGNOSIS — E1169 Type 2 diabetes mellitus with other specified complication: Secondary | ICD-10-CM

## 2022-01-18 DIAGNOSIS — E559 Vitamin D deficiency, unspecified: Secondary | ICD-10-CM

## 2022-01-18 DIAGNOSIS — E785 Hyperlipidemia, unspecified: Secondary | ICD-10-CM | POA: Diagnosis not present

## 2022-01-18 DIAGNOSIS — M545 Low back pain, unspecified: Secondary | ICD-10-CM | POA: Diagnosis not present

## 2022-01-18 DIAGNOSIS — G2581 Restless legs syndrome: Secondary | ICD-10-CM

## 2022-01-18 DIAGNOSIS — I152 Hypertension secondary to endocrine disorders: Secondary | ICD-10-CM

## 2022-01-18 DIAGNOSIS — I7121 Aneurysm of the ascending aorta, without rupture: Secondary | ICD-10-CM

## 2022-01-18 DIAGNOSIS — F5101 Primary insomnia: Secondary | ICD-10-CM | POA: Diagnosis not present

## 2022-01-18 DIAGNOSIS — M5412 Radiculopathy, cervical region: Secondary | ICD-10-CM | POA: Diagnosis not present

## 2022-01-18 DIAGNOSIS — I5023 Acute on chronic systolic (congestive) heart failure: Secondary | ICD-10-CM

## 2022-01-18 DIAGNOSIS — E039 Hypothyroidism, unspecified: Secondary | ICD-10-CM | POA: Diagnosis not present

## 2022-01-18 DIAGNOSIS — E0842 Diabetes mellitus due to underlying condition with diabetic polyneuropathy: Secondary | ICD-10-CM | POA: Diagnosis not present

## 2022-01-18 DIAGNOSIS — N1831 Chronic kidney disease, stage 3a: Secondary | ICD-10-CM | POA: Diagnosis not present

## 2022-01-18 DIAGNOSIS — E1159 Type 2 diabetes mellitus with other circulatory complications: Secondary | ICD-10-CM

## 2022-01-18 DIAGNOSIS — F3342 Major depressive disorder, recurrent, in full remission: Secondary | ICD-10-CM

## 2022-01-18 DIAGNOSIS — Z794 Long term (current) use of insulin: Secondary | ICD-10-CM

## 2022-01-18 DIAGNOSIS — I2510 Atherosclerotic heart disease of native coronary artery without angina pectoris: Secondary | ICD-10-CM

## 2022-01-18 DIAGNOSIS — Z79891 Long term (current) use of opiate analgesic: Secondary | ICD-10-CM | POA: Diagnosis not present

## 2022-01-18 DIAGNOSIS — I480 Paroxysmal atrial fibrillation: Secondary | ICD-10-CM

## 2022-01-18 LAB — BAYER DCA HB A1C WAIVED: HB A1C (BAYER DCA - WAIVED): 6.6 % — ABNORMAL HIGH (ref 4.8–5.6)

## 2022-01-18 MED ORDER — TRAZODONE HCL 100 MG PO TABS: 100.0000 mg | ORAL_TABLET | Freq: Every day | ORAL | 1 refills | Status: AC

## 2022-01-18 MED ORDER — WARFARIN SODIUM 5 MG PO TABS: 5.0000 mg | ORAL_TABLET | Freq: Every day | ORAL | 0 refills | Status: AC

## 2022-01-18 MED ORDER — FUROSEMIDE 40 MG PO TABS: 40.0000 mg | ORAL_TABLET | Freq: Every day | ORAL | 0 refills | Status: DC

## 2022-01-18 MED ORDER — AMLODIPINE BESYLATE 5 MG PO TABS: 2.5000 mg | ORAL_TABLET | Freq: Every day | ORAL | 3 refills | Status: AC

## 2022-01-18 NOTE — Progress Notes (Signed)
? ?Subjective:  ? ? Patient ID: Andre Malone, male    DOB: Aug 21, 1951, 71 y.o.   MRN: [id number] ? ? ?Chief Complaint: Medical Management of Chronic Issues ?  ? ?HPI: ? ?Andre Malone is a 71 y.o. who identifies as a male who was assigned male at birth.  ? ?Social history: ?Lives with: by himself in subsidized housing ?Work history: retired ? ? ?Comes in today for follow up of the following chronic medical issues: ? ?1. Hypertension associated with diabetes ([place]) ?No c/o chest pain,sob or headache. Does not check blood pressure at home. ?BP Readings from Last 3 Encounters:  ?10/20/21 110/78  ?06/21/21 118/67  ?06/15/21 (!) 131/59  ? ? ? ?2. Hyperlipidemia associated with type 2 diabetes mellitus ([place]) ?Doe snit really watch diet and doe sno exercise at all. ?Lab Results  ?Component Value Date  ? CHOL 257 (H) 10/20/2021  ? HDL 34 (L) 10/20/2021  ? LDLCALC 148 (H) 10/20/2021  ? TRIG 399 (H) 10/20/2021  ? CHOLHDL 7.6 (H) 10/20/2021  ? ? ? ?3. Type 2 diabetes mellitus with diabetic neuropathy, with long-term current use of insulin ([place]) ?Fasting blodd sugars are running around 90's-140. He has had no low readings ?Lab Results  ?Component Value Date  ? HGBA1C 7.7 (H) 10/20/2021  ? ? ? ?4. Hypothyroidism, unspecified type ?No problems that he is aware of. We increased his levothyroxin dose at last appointment. ?Lab Results  ?Component Value Date  ? TSH 8.800 (H) 10/20/2021  ? ? ? ?5. Aneurysm of ascending aorta without rupture (HCC) ?6. Atherosclerosis of native coronary artery of native heart without angina pectoris ?7. Acute on chronic systolic heart failure ([place]) ?8. Atrioventricular block, complete (HCC) ?Last saw cardiology over a year ago. There have been no changes to plan of care. He has been going to the coumadin clinic in [place]. Is on plavix with his coumadin and has had no bleeding issues. ? ?9. Stage 3a chronic kidney disease ([place]) ?Lab Results  ?Component Value Date  ? CREATININE 1.41 (H)  10/20/2021  ? ? ? ?10. RLS (restless legs syndrome) ?Is on no prescription meds for this. Has not been bothering him as much lately ? ?11. Recurrent major depressive disorder, in full remission ([place]) ?He has been severely depressed since the death of his wife. He is on zoloft and wellbutrin. Says he has good days and bad days. ? ?  01/18/2022  ?  2:31 PM 10/20/2021  ? 11:39 AM 07/04/2021  ? 11:19 AM  ?Depression screen PHQ 2/9  ?Decreased Interest 0 1 0  ?Down, Depressed, Hopeless 0 1 1  ?PHQ - 2 Score 0 2 1  ?Altered sleeping 0 1   ?Tired, decreased energy 0 2   ?Change in appetite 0 1   ?Feeling bad or failure about yourself  0 1   ?Trouble concentrating 0 0   ?Moving slowly or fidgety/restless 0 0   ?Suicidal thoughts 0 0   ?PHQ-9 Score 0 7   ?Difficult doing work/chores Not difficult at all Somewhat difficult   ? ? ? ?12. Primary insomnia ?Is on trazadone to sleep at night. Sleeps 6-8 hours a night. ? ?13. Vitamin D deficiency ?Is on daily vitamin d supplement ? ?14. BMI 34.0-34.9 ?Weight is down 10 lbs from previous ?Wt Readings from Last 3 Encounters:  ?01/18/22 212 lb (96.2 kg)  ?10/20/21 222 lb (100.7 kg)  ?07/04/21 226 lb (102.5 kg)  ? ?BMI Readings from Last 3 Encounters:  ?01/18/22  34.22 kg/m?  ?10/20/21 35.83 kg/m?  ?07/04/21 36.48 kg/m?  ? ? ? ? ? ?New complaints: ?None today ? ?Allergies  ?Allergen Reactions  ? Apresoline [Hydralazine] Nausea And Vomiting  ? Atorvastatin Other (See Comments)  ?  achiness  ? Praluent [Alirocumab]   ?  fatigue  ? Rosuvastatin   ?  Myalgias on 5-40mg  daily dosing  ? Ace Inhibitors Other (See Comments)  ?  cough  ? Benadryl [Diphenhydramine Hcl] Other (See Comments)  ?  Makes restless legs worse  ? Imdur [Isosorbide Dinitrate] Other (See Comments)  ?  headache  ? Metoprolol Other (See Comments)  ?  Kidney failure  ? Nsaids Other (See Comments)  ?  REACTION: Currently taking Coumadin  ? Valsartan Other (See Comments)  ?  REACTION:shuts down Kidney  ? ?Outpatient Encounter  Medications as of 01/18/2022  ?Medication Sig  ? acetaminophen (TYLENOL) 500 MG tablet Take 2 tablets (1,000 mg total) by mouth every 6 (six) hours.  ? amLODipine (NORVASC) 5 MG tablet Take 0.5 tablets (2.5 mg total) by mouth daily.  ? buPROPion (WELLBUTRIN XL) 150 MG 24 hr tablet Take 1 tablet (150 mg total) by mouth daily.  ? carboxymethylcellulose (REFRESH PLUS) 0.5 % SOLN INSTILL 1 DROP IN BOTH EYES FOUR TIMES A DAY AS NEEDED  ? clopidogrel (PLAVIX) 75 MG tablet Take 1 tablet (75 mg total) by mouth daily with breakfast.  ? furosemide (LASIX) 40 MG tablet Take 1 tablet (40 mg total) by mouth daily.  ? lactase (LACTAID) 3000 units tablet Take 9,000 Units by mouth daily as needed (when consuming dairy products).  ? levothyroxine (SYNTHROID) 112 MCG tablet Take 1 tablet (112 mcg total) by mouth daily.  ? metFORMIN (GLUCOPHAGE) 1000 MG tablet TAKE 1 TABLET BY MOUTH 2 (TWO) TIMES DAILY WITH A MEAL. (NEEDS TO BE SEEN BEFORE NEXT REFILL)  ? Multiple Vitamin (MULTIVITAMIN WITH MINERALS) TABS tablet Take 1 tablet by mouth daily.  ? nitroGLYCERIN (NITROSTAT) 0.4 MG SL tablet Place 1 tablet (0.4 mg total) under the tongue every 5 (five) minutes as needed for chest pain (Do not give more than 3 SL tablets in 15 minutes.).  ? oxyCODONE (ROXICODONE) 15 MG immediate release tablet Take 15 mg by mouth every 6 (six) hours as needed for pain.  ? PFIZER-BIONT COVID-19 VAC-TRIS SUSP injection   ? polyethylene glycol (MIRALAX / GLYCOLAX) 17 g packet Take 17 g by mouth daily as needed for mild constipation.  ? sertraline (ZOLOFT) 100 MG tablet Take 1 tablet (100 mg total) by mouth daily. (NEEDS TO BE SEEN BEFORE NEXT REFILL)  ? traZODone (DESYREL) 100 MG tablet Take 1 tablet (100 mg total) by mouth at bedtime.  ? vitamin C (ASCORBIC ACID) 500 MG tablet Take 500 mg by mouth daily.  ? warfarin (COUMADIN) 5 MG tablet Take 1 tablet (5 mg total) by mouth daily.  ? ?No facility-administered encounter medications on file as of 01/18/2022.   ? ? ?Past Surgical History:  ?Procedure Laterality Date  ? AORTIC ROOT REPLACEMENT    ? AORTIC VALVE REPLACEMENT    ? CARDIAC CATHETERIZATION    ? CARDIAC VALVE REPLACEMENT    ? CORONARY ANGIOGRAPHY N/A 12/06/2020  ? Procedure: CORONARY ANGIOGRAPHY;  Surgeon: Lorretta Harp, MD;  Location: [place] CV LAB;  Service: Cardiovascular;  Laterality: N/A;  ? CORONARY STENT INTERVENTION N/A 12/06/2020  ? Procedure: CORONARY STENT INTERVENTION;  Surgeon: Lorretta Harp, MD;  Location: [place] CV LAB;  Service: Cardiovascular;  Laterality:  N/A;  ? LEAD REVISION N/A 09/17/2013  ? Procedure: LEAD REVISION;  Surgeon: Deboraha Sprang, MD;  Location: [facility] CATH LAB;  Service: Cardiovascular;  Laterality: N/A;  ? LEFT HEART CATHETERIZATION WITH CORONARY ANGIOGRAM N/A 05/30/2013  ? Procedure: LEFT HEART CATHETERIZATION WITH CORONARY ANGIOGRAM;  Surgeon: Peter M Martinique, MD;  Location: [facility] CATH LAB;  Service: Cardiovascular;  Laterality: N/A;  ? PACEMAKER INSERTION  05/17/2009; 09/17/2013  ? STJ dual chamber pacemaker implanted for CHB; RV lead revision and generator change 09/17/2013 by Dr Caryl Comes for ventricular lead malfunction  ? Right elbow surgery    ? Subxiphoid window    ? TONSILLECTOMY    ? TOTAL HIP ARTHROPLASTY  02/27/2012  ? Procedure: TOTAL HIP ARTHROPLASTY - left  Surgeon: Garald Balding, MD;  Location: [place];  Service: Orthopedics;  Laterality: Left;  ? ? ?Family History  ?Problem Relation Age of Onset  ? Heart disease Father   ?     Deceased  ? Alcohol abuse Father   ? Other Mother   ?     Deceased at young age  ? Stroke Mother   ?     after delivery  ? Heart attack Brother   ? Other Brother   ?     MVA  ? Heart disease Paternal Grandfather   ? Healthy Son   ? Healthy Daughter   ? ? ? ? ?Controlled substance contract: n/a ? ? ? ?Review of Systems  ?Constitutional:  Negative for diaphoresis.  ?Eyes:  Negative for pain.  ?Respiratory:  Negative for shortness of breath.   ?Cardiovascular:  Negative for chest pain,  palpitations and leg swelling.  ?Gastrointestinal:  Negative for abdominal pain.  ?Endocrine: Negative for polydipsia.  ?Skin:  Negative for rash.  ?Neurological:  Negative for dizziness, weakness and headaches.  ?Hematol

## 2022-01-19 LAB — CBC WITH DIFFERENTIAL/PLATELET
Basophils Absolute: 0.1 10*3/uL (ref 0.0–0.2)
Basos: 1 %
EOS (ABSOLUTE): 0.2 10*3/uL (ref 0.0–0.4)
Eos: 2 %
Hematocrit: 40.6 % (ref 37.5–51.0)
Hemoglobin: 13.2 g/dL (ref 13.0–17.7)
Immature Grans (Abs): 0 10*3/uL (ref 0.0–0.1)
Immature Granulocytes: 0 %
Lymphocytes Absolute: 0.9 10*3/uL (ref 0.7–3.1)
Lymphs: 9 %
MCH: 27.8 pg (ref 26.6–33.0)
MCHC: 32.5 g/dL (ref 31.5–35.7)
MCV: 86 fL (ref 79–97)
Monocytes Absolute: 0.7 10*3/uL (ref 0.1–0.9)
Monocytes: 8 %
Neutrophils Absolute: 7.4 10*3/uL — ABNORMAL HIGH (ref 1.4–7.0)
Neutrophils: 80 %
Platelets: 173 10*3/uL (ref 150–450)
RBC: 4.74 x10E6/uL (ref 4.14–5.80)
RDW: 15 % (ref 11.6–15.4)
WBC: 9.2 10*3/uL (ref 3.4–10.8)

## 2022-01-19 LAB — CMP14+EGFR
ALT: 12 IU/L (ref 0–44)
AST: 19 IU/L (ref 0–40)
Albumin/Globulin Ratio: 1.4 (ref 1.2–2.2)
Albumin: 4.6 g/dL (ref 3.8–4.8)
Alkaline Phosphatase: 73 IU/L (ref 44–121)
BUN/Creatinine Ratio: 16 (ref 10–24)
BUN: 29 mg/dL — ABNORMAL HIGH (ref 8–27)
Bilirubin Total: 0.3 mg/dL (ref 0.0–1.2)
CO2: 26 mmol/L (ref 20–29)
Calcium: 10.2 mg/dL (ref 8.6–10.2)
Chloride: 98 mmol/L (ref 96–106)
Creatinine, Ser: 1.81 mg/dL — ABNORMAL HIGH (ref 0.76–1.27)
Globulin, Total: 3.4 g/dL (ref 1.5–4.5)
Glucose: 144 mg/dL — ABNORMAL HIGH (ref 70–99)
Potassium: 4.4 mmol/L (ref 3.5–5.2)
Sodium: 139 mmol/L (ref 134–144)
Total Protein: 8 g/dL (ref 6.0–8.5)
eGFR: 40 mL/min/{1.73_m2} — ABNORMAL LOW (ref >59)

## 2022-01-19 LAB — LIPID PANEL
Chol/HDL Ratio: 7.8 ratio — ABNORMAL HIGH (ref 0.0–5.0)
Cholesterol, Total: 281 mg/dL — ABNORMAL HIGH (ref 100–199)
HDL: 36 mg/dL — ABNORMAL LOW (ref >39)
LDL Chol Calc (NIH): 176 mg/dL — ABNORMAL HIGH (ref 0–99)
Triglycerides: 355 mg/dL — ABNORMAL HIGH (ref 0–149)
VLDL Cholesterol Cal: 69 mg/dL — ABNORMAL HIGH (ref 5–40)

## 2022-01-19 LAB — THYROID PANEL WITH TSH
Free Thyroxine Index: 2.1 (ref 1.2–4.9)
T3 Uptake Ratio: 29 % (ref 24–39)
T4, Total: 7.4 ug/dL (ref 4.5–12.0)
TSH: 3.17 u[IU]/mL (ref 0.450–4.500)
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
## 2022-01-31 ENCOUNTER — Encounter: Payer: Self-pay | Admitting: Physician Assistant

## 2022-01-31 ENCOUNTER — Ambulatory Visit: Payer: Medicare HMO | Admitting: Physician Assistant

## 2022-01-31 VITALS — BP 118/58 | HR 54 | Ht 66.0 in | Wt 215.4 lb

## 2022-01-31 DIAGNOSIS — Z952 Presence of prosthetic heart valve: Secondary | ICD-10-CM | POA: Diagnosis not present

## 2022-01-31 DIAGNOSIS — I442 Atrioventricular block, complete: Secondary | ICD-10-CM

## 2022-01-31 DIAGNOSIS — Z45010 Encounter for checking and testing of cardiac pacemaker pulse generator [battery]: Secondary | ICD-10-CM | POA: Diagnosis not present

## 2022-01-31 DIAGNOSIS — I10 Essential (primary) hypertension: Secondary | ICD-10-CM | POA: Diagnosis not present

## 2022-01-31 DIAGNOSIS — I2510 Atherosclerotic heart disease of native coronary artery without angina pectoris: Secondary | ICD-10-CM

## 2022-01-31 DIAGNOSIS — I480 Paroxysmal atrial fibrillation: Secondary | ICD-10-CM | POA: Diagnosis not present

## 2022-01-31 LAB — CUP PACEART INCLINIC DEVICE CHECK
Battery Remaining Longevity: 0 mo
Battery Voltage: 2.57 V
Brady Statistic RA Percent Paced: 83 %
Brady Statistic RV Percent Paced: 98 %
Date Time Interrogation Session: 20230530182804
Implantable Lead Implant Date: 20100913
Implantable Lead Implant Date: 20150114
Implantable Lead Location: 753859
Implantable Lead Location: 753860
Implantable Pulse Generator Implant Date: 20150114
Lead Channel Impedance Value: 325 Ohm
Lead Channel Impedance Value: 550 Ohm
Lead Channel Pacing Threshold Amplitude: 0.75 V
Lead Channel Pacing Threshold Amplitude: 0.75 V
Lead Channel Pacing Threshold Amplitude: 1 V
Lead Channel Pacing Threshold Amplitude: 1 V
Lead Channel Pacing Threshold Pulse Width: 0.5 ms
Lead Channel Pacing Threshold Pulse Width: 0.5 ms
Lead Channel Pacing Threshold Pulse Width: 0.8 ms
Lead Channel Pacing Threshold Pulse Width: 0.8 ms
Lead Channel Sensing Intrinsic Amplitude: 3.8 mV
Lead Channel Sensing Intrinsic Amplitude: 8.2 mV
Lead Channel Setting Pacing Amplitude: 2.5 V
Lead Channel Setting Pacing Amplitude: 2.5 V
Lead Channel Setting Pacing Pulse Width: 0.5 ms
Lead Channel Setting Sensing Sensitivity: 4 mV
Pulse Gen Model: 2240
Pulse Gen Serial Number: 7586540

## 2022-01-31 NOTE — Patient Instructions (Signed)
Medication Instructions:   Your physician recommends that you continue on your current medications as directed. Please refer to the Current Medication list given to you today.   *If you need a refill on your cardiac medications before your next appointment, please call your pharmacy*   Lab Work: [place]   If you have labs (blood work) drawn today and your tests are completely normal, you will receive your results only by: [place] (if you have MyChart) OR A paper copy in the mail If you have any lab test that is abnormal or we need to change your treatment, we will call you to review the results.   Testing/Procedures: NONE ORDERED  TODAY    Follow-Up: At [facility], you and your health needs are our priority.  As part of our continuing mission to provide you with exceptional heart care, we have created designated Provider Care Teams.  These Care Teams include your primary Cardiologist (physician) and Advanced Practice Providers (APPs -  Physician Assistants and Nurse Practitioners) who all work together to provide you with the care you need, when you need it.  We recommend signing up for the patient portal called "MyChart".  Sign up information is provided on this After Visit Summary.  MyChart is used to connect with patients for Virtual Visits (Telemedicine).  Patients are able to view lab/test results, encounter notes, upcoming appointments, etc.  Non-urgent messages can be sent to your provider as well.   To learn more about what you can do with MyChart, go to [email or web address].    Your next appointment:   2  week(s)  The format for your next appointment:   In Person  Provider:   Cristopher Peru, MD  Other Instructions   Important Information About Sugar

## 2022-02-02 NOTE — Addendum Note (Signed)
Addended by: Geralyn Flash D on: 02/02/2022 01:22 PM   Modules accepted: Level of Service

## 2022-02-02 NOTE — Progress Notes (Signed)
Remote pacemaker transmission.   

## 2022-02-13 ENCOUNTER — Ambulatory Visit (INDEPENDENT_AMBULATORY_CARE_PROVIDER_SITE_OTHER): Payer: Medicare HMO | Admitting: *Deleted

## 2022-02-13 DIAGNOSIS — Z952 Presence of prosthetic heart valve: Secondary | ICD-10-CM

## 2022-02-13 DIAGNOSIS — I480 Paroxysmal atrial fibrillation: Secondary | ICD-10-CM | POA: Diagnosis not present

## 2022-02-13 LAB — POCT INR: INR: 2.5 (ref 2.0–3.0)

## 2022-02-13 NOTE — Patient Instructions (Signed)
Description   ?Continue taking 1/2 tablet (2.5mg) daily except 1 tablet (5mg) on Mondays, Wednesdays and Fridays. Recheck in 4 weeks. PLEASE ADHERE TO APPOINTMENTS. [facility] [phone].  ?  ?  ?

## 2022-02-15 ENCOUNTER — Encounter: Payer: Self-pay | Admitting: Internal Medicine

## 2022-02-15 ENCOUNTER — Ambulatory Visit: Payer: Medicare HMO | Admitting: Internal Medicine

## 2022-02-15 VITALS — BP 120/92 | HR 55 | Ht 66.0 in | Wt 209.8 lb

## 2022-02-15 DIAGNOSIS — Z950 Presence of cardiac pacemaker: Secondary | ICD-10-CM

## 2022-02-15 DIAGNOSIS — I442 Atrioventricular block, complete: Secondary | ICD-10-CM | POA: Diagnosis not present

## 2022-02-15 DIAGNOSIS — T82110D Breakdown (mechanical) of cardiac electrode, subsequent encounter: Secondary | ICD-10-CM

## 2022-02-15 DIAGNOSIS — Z952 Presence of prosthetic heart valve: Secondary | ICD-10-CM

## 2022-02-15 LAB — CUP PACEART REMOTE DEVICE CHECK
Battery Remaining Longevity: 0 mo
Battery Voltage: 2.57 V
Brady Statistic AP VP Percent: 82 %
Brady Statistic AP VS Percent: 1 %
Brady Statistic AS VP Percent: 16 %
Brady Statistic AS VS Percent: 1 %
Brady Statistic RA Percent Paced: 83 %
Brady Statistic RV Percent Paced: 98 %
Date Time Interrogation Session: 20230612100943
Implantable Lead Implant Date: 20100913
Implantable Lead Implant Date: 20150114
Implantable Lead Location: 753859
Implantable Lead Location: 753860
Implantable Pulse Generator Implant Date: 20150114
Lead Channel Impedance Value: 300 Ohm
Lead Channel Impedance Value: 490 Ohm
Lead Channel Pacing Threshold Amplitude: 0.75 V
Lead Channel Pacing Threshold Amplitude: 1 V
Lead Channel Pacing Threshold Pulse Width: 0.5 ms
Lead Channel Pacing Threshold Pulse Width: 0.8 ms
Lead Channel Sensing Intrinsic Amplitude: 2.7 mV
Lead Channel Sensing Intrinsic Amplitude: 6.5 mV
Lead Channel Setting Pacing Amplitude: 2.5 V
Lead Channel Setting Pacing Amplitude: 2.5 V
Lead Channel Setting Pacing Pulse Width: 0.5 ms
Lead Channel Setting Sensing Sensitivity: 4 mV
Pulse Gen Model: 2240
Pulse Gen Serial Number: 7586540

## 2022-02-15 NOTE — Patient Instructions (Signed)
Medication Instructions:  Your physician recommends that you continue on your current medications as directed. Please refer to the Current Medication list given to you today.  Labwork: None ordered.  Testing/Procedures: None ordered.  Follow up:  SEE INSTRUCTION LETTER.  Any Other Special Instructions Will Be Listed Below (If Applicable).  If you need a refill on your cardiac medications before your next appointment, please call your pharmacy.   Pacemaker Battery Change  A pacemaker battery usually lasts 5-15 years (6-7 years on average). A few times a year, you may be asked to visit your health care provider to have a full evaluation of your pacemaker. When the battery is low, your pacemaker will be completely replaced. Most often, this procedure is simpler than the first surgery because the wires (leads) that connect the pacemaker to the heart are already in place. There are many things that affect how long a pacemaker battery will last, including: The age of the pacemaker. The number of leads you have(1, 2, or 3). The use or workload of the pacemaker. If the pacemaker is helping the heart more often, the battery will not last as long. Power (voltage) settings. Tell a health care provider about: Any allergies you have. All medicines you are taking, including vitamins, herbs, eye drops, creams, and over-the-counter medicines. Any problems you or family members have had with anesthetic medicines. Any blood disorders you have. Any surgeries you have had, especially any surgeries you have had since your last pacemaker was placed. Any medical conditions you have. Whether you are pregnant or may be pregnant. What are the risks? Generally, this is a safe procedure. However, problems may occur, including: Bleeding. Infection. Nerve damage. Allergic reaction to medicines. Damage to the leads that go to the heart. What happens before the procedure? Staying hydrated Follow  instructions from your health care provider about hydration, which may include: Up to 2 hours before the procedure - you may continue to drink clear liquids, such as water, clear fruit juice, black coffee, and plain tea. Eating and drinking restrictions Follow instructions from your health care provider about eating and drinking restrictions, which may include: 8 hours before the procedure - stop eating heavy meals or foods, such as meat, fried foods, or fatty foods. 6 hours before the procedure - stop eating light meals or foods, such as toast or cereal. 6 hours before the procedure - stop drinking milk or drinks that contain milk. 2 hours before the procedure - stop drinking clear liquids. Medicines Ask your health care provider about: Changing or stopping your regular medicines. This is especially important if you are taking diabetes medicines or blood thinners. Taking medicines such as aspirin and ibuprofen. These medicines can thin your blood. Do not take these medicines unless your health care provider tells you to take them. Taking over-the-counter medicines, vitamins, herbs, and supplements. General instructions Ask your health care provider what steps will be taken to help prevent infection. These may include: Removing hair at the surgery site. Washing skin with a germ-killing soap. Receiving antibiotic medicine. Plan to have someone take you home from the hospital or clinic. If you will be going home right after the procedure, plan to have someone with you for 24 hours. What happens during the procedure? An IV will be inserted into one of your veins. You will be given one or more of the following: A medicine to help you relax (sedative). A medicine to numb the area where the pacemaker is located (local anesthetic). Your  health care provider will make an incision to reopen the pocket holding the pacemaker. The old pacemaker will be disconnected from the leads. The leads will be  tested. If needed, the leads will be replaced. If the leads are functioning properly, the new pacemaker will be connected to the existing leads. A heart monitor and a pacemaker programmer will be used to make sure that the newly implanted pacemaker is working properly. The incision site will be closed with stitches (sutures), adhesive strips, or skin glue. A bandage (dressing) will be placed over the pacemaker site. The procedure may vary among health care providers and hospitals. What happens after the procedure? Your blood pressure, heart rate, breathing rate, and blood oxygen level will be monitored until you leave the hospital or clinic. You may be given antibiotics. Your health care provider will tell you when your pacemaker will need to be tested again, or when to return to the office for removal of the dressing and sutures. If you were given a sedative during the procedure, it can affect you for several hours. Do not drive or operate machinery until your health care provider says that it is safe. You will be given a pacemaker identification card. This card lists the implant date, device model, and manufacturer of your pacemaker. Summary A pacemaker battery usually lasts 5-15 years (6-7 years on average). When the battery is low, your pacemaker will need to be replaced. Most often, this procedure is simpler than the first surgery because the wires (leads) that connect the pacemaker to the heart are already in place. Risks of this procedure include bleeding, infection, and allergic reactions to medicines. This information is not intended to replace advice given to you by your health care provider. Make sure you discuss any questions you have with your health care provider. Document Revised: 07/24/2019 Document Reviewed: 07/24/2019 Elsevier Patient Education  2023 [employer].
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
## 2022-02-16 ENCOUNTER — Ambulatory Visit (INDEPENDENT_AMBULATORY_CARE_PROVIDER_SITE_OTHER): Payer: Medicare HMO

## 2022-02-16 DIAGNOSIS — I442 Atrioventricular block, complete: Secondary | ICD-10-CM

## 2022-02-23 ENCOUNTER — Other Ambulatory Visit: Payer: Self-pay | Admitting: Nurse Practitioner

## 2022-02-23 DIAGNOSIS — F3342 Major depressive disorder, recurrent, in full remission: Secondary | ICD-10-CM

## 2022-03-16 ENCOUNTER — Telehealth: Payer: Self-pay

## 2022-03-16 NOTE — Telephone Encounter (Signed)
-----   Message from Wiliam Ke, RN sent at 02/15/2022  9:21 AM EDT ----- Regarding: STAT INR for coumadin instruction mech valve

## 2022-03-17 ENCOUNTER — Encounter (HOSPITAL_COMMUNITY): Payer: Self-pay | Admitting: Vascular Surgery

## 2022-03-17 ENCOUNTER — Emergency Department (HOSPITAL_BASED_OUTPATIENT_CLINIC_OR_DEPARTMENT_OTHER)
Admission: EM | Admit: 2022-03-17 | Discharge: 2022-03-17 | Disposition: A | Discharge status: Home / Self Care | Payer: Medicare HMO | Attending: Emergency Medicine | Admitting: Emergency Medicine

## 2022-03-17 ENCOUNTER — Emergency Department (HOSPITAL_BASED_OUTPATIENT_CLINIC_OR_DEPARTMENT_OTHER): Payer: Medicare HMO

## 2022-03-17 ENCOUNTER — Other Ambulatory Visit: Payer: Self-pay

## 2022-03-17 ENCOUNTER — Encounter (HOSPITAL_COMMUNITY): Payer: Self-pay | Admitting: Internal Medicine

## 2022-03-17 ENCOUNTER — Other Ambulatory Visit: Payer: Medicare HMO

## 2022-03-17 DIAGNOSIS — Z7984 Long term (current) use of oral hypoglycemic drugs: Secondary | ICD-10-CM | POA: Diagnosis not present

## 2022-03-17 DIAGNOSIS — E1122 Type 2 diabetes mellitus with diabetic chronic kidney disease: Secondary | ICD-10-CM | POA: Insufficient documentation

## 2022-03-17 DIAGNOSIS — R69 Illness, unspecified: Secondary | ICD-10-CM | POA: Diagnosis not present

## 2022-03-17 DIAGNOSIS — Z950 Presence of cardiac pacemaker: Secondary | ICD-10-CM | POA: Insufficient documentation

## 2022-03-17 DIAGNOSIS — F0390 Unspecified dementia without behavioral disturbance: Secondary | ICD-10-CM

## 2022-03-17 DIAGNOSIS — I129 Hypertensive chronic kidney disease with stage 1 through stage 4 chronic kidney disease, or unspecified chronic kidney disease: Secondary | ICD-10-CM | POA: Diagnosis not present

## 2022-03-17 DIAGNOSIS — Z79899 Other long term (current) drug therapy: Secondary | ICD-10-CM | POA: Insufficient documentation

## 2022-03-17 DIAGNOSIS — I2510 Atherosclerotic heart disease of native coronary artery without angina pectoris: Secondary | ICD-10-CM | POA: Insufficient documentation

## 2022-03-17 DIAGNOSIS — R4182 Altered mental status, unspecified: Secondary | ICD-10-CM | POA: Insufficient documentation

## 2022-03-17 DIAGNOSIS — N1832 Chronic kidney disease, stage 3b: Secondary | ICD-10-CM

## 2022-03-17 DIAGNOSIS — E039 Hypothyroidism, unspecified: Secondary | ICD-10-CM | POA: Insufficient documentation

## 2022-03-17 DIAGNOSIS — N189 Chronic kidney disease, unspecified: Secondary | ICD-10-CM | POA: Diagnosis not present

## 2022-03-17 LAB — COMPREHENSIVE METABOLIC PANEL
ALT: 8 U/L (ref 0–44)
AST: 25 U/L (ref 15–41)
Albumin: 4.5 g/dL (ref 3.5–5.0)
Alkaline Phosphatase: 54 U/L (ref 38–126)
Anion gap: 8 (ref 5–15)
BUN: 15 mg/dL (ref 8–23)
CO2: 26 mmol/L (ref 22–32)
Calcium: 10 mg/dL (ref 8.9–10.3)
Chloride: 103 mmol/L (ref 98–111)
Creatinine, Ser: 1.91 mg/dL — ABNORMAL HIGH (ref 0.61–1.24)
GFR, Estimated: 37 mL/min — ABNORMAL LOW (ref >60)
Glucose, Bld: 164 mg/dL — ABNORMAL HIGH (ref 70–99)
Potassium: 4.7 mmol/L (ref 3.5–5.1)
Sodium: 137 mmol/L (ref 135–145)
Total Bilirubin: 0.7 mg/dL (ref 0.3–1.2)
Total Protein: 7.5 g/dL (ref 6.5–8.1)

## 2022-03-17 LAB — CBC
HCT: 37.3 % — ABNORMAL LOW (ref 39.0–52.0)
Hemoglobin: 12.1 g/dL — ABNORMAL LOW (ref 13.0–17.0)
MCH: 29.2 pg (ref 26.0–34.0)
MCHC: 32.4 g/dL (ref 30.0–36.0)
MCV: 90.1 fL (ref 80.0–100.0)
Platelets: 155 10*3/uL (ref 150–400)
RBC: 4.14 MIL/uL — ABNORMAL LOW (ref 4.22–5.81)
RDW: 16.1 % — ABNORMAL HIGH (ref 11.5–15.5)
WBC: 6.2 10*3/uL (ref 4.0–10.5)
nRBC: 0 % (ref 0.0–0.2)

## 2022-03-17 LAB — URINALYSIS, ROUTINE W REFLEX MICROSCOPIC
Bilirubin Urine: NEGATIVE
Glucose, UA: NEGATIVE mg/dL
Hgb urine dipstick: NEGATIVE
Ketones, ur: NEGATIVE mg/dL
Leukocytes,Ua: NEGATIVE
Nitrite: NEGATIVE
Specific Gravity, Urine: 1.018 (ref 1.005–1.030)
pH: 5 (ref 5.0–8.0)
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
## 2022-03-17 NOTE — ED Notes (Signed)
Patient transported to CT 

## 2022-03-17 NOTE — Telephone Encounter (Signed)
Pt was supposed to come in today for a STAT INR. I checked at noon and he had not come in yet, I called the pt and advised him that we needed this result today for him to have his procedure on Monday.   At 2:00pm he still had not showed up to have this done so Sherri P, RN called him and explained how important this is and that he needs to come to our office immediately so this will result today. He stated that he would come now.   I spoke with Boneta Lucks, RN (Dr Bruna Potter RN) and she is aware.

## 2022-03-17 NOTE — Discharge Instructions (Signed)
He can appointment follow-up with his doctor.  Needs to have renal function followed carefully showing signs of chronic kidney disease at least since May.  Could be early signs of dementia.  We will try to ensure that he takes his depression medicines as prescribed.  And then have him follow-up with primary care doctor.
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
## 2022-03-17 NOTE — ED Provider Notes (Signed)
[facility] EMERGENCY DEPT Provider Note   CSN: [id number] Arrival date & time: 03/17/22  1917     History  Chief Complaint  Patient presents with   Altered Mental Status    Andre Malone is a 71 y.o. male.  Patient brought in by family members.  They are concerned about some altered mental status started about 2 weeks ago.  He has had some spells like this in the past they have been brief some concerns about dementia some concerns about his medications.  Patient acting fairly normal here.  Temp is normal blood pressure good heart rate in 54's.  Patient is due to have his pacemaker replaced in the next couple days.  But still functioning.  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  Bicuspid aortic valve with repair of that.  His maker originally placed in 2010 redone in 2015.  Never smoked.  Patient states he feels depressed.  He is taking his Zoloft.  But his little bit of confusion on whether he is taking it like he supposed to family not able to.  Denies any suicidal ideation.  Denies any homicidal ideation.       Home Medications Prior to Admission medications   Medication Sig Start Date End Date Taking? Authorizing Provider  acetaminophen (TYLENOL) 500 MG tablet Take 2 tablets (1,000 mg total) by mouth every 6 (six) hours. Patient taking differently: Take 1,000 mg by mouth every 6 (six) hours as needed (pain.). 03/04/20   Meuth, Brooke A, PA-C  amLODipine (NORVASC) 5 MG tablet Take 0.5 tablets (2.5 mg total) by mouth daily. Patient taking differently: Take 2.5 mg by mouth in the morning and at bedtime. 01/18/22 01/13/23  Bennie Pierini, FNP  buPROPion (WELLBUTRIN XL) 150 MG 24 hr tablet TAKE 1 TABLET BY MOUTH EVERY DAY 02/23/22   Daphine Deutscher, [name], FNP  carboxymethylcellulose (REFRESH PLUS) 0.5 %  SOLN INSTILL 1 DROP IN BOTH EYES FOUR TIMES A DAY AS NEEDED 03/31/21   [provider]  clopidogrel (PLAVIX) 75 MG tablet Take 1 tablet (75 mg total) by mouth daily with breakfast. 10/20/21   Bennie Pierini, FNP  furosemide (LASIX) 40 MG tablet Take 1 tablet (40 mg total) by mouth daily. 01/18/22   Daphine Deutscher, [name], FNP  lactase (LACTAID) 3000 units tablet Take 9,000 Units by mouth daily as needed (when consuming dairy products).    [provider]  levothyroxine (SYNTHROID) 100 MCG tablet Take 100 mcg by mouth daily before breakfast. 02/23/22   [provider]  levothyroxine (SYNTHROID) 112 MCG tablet Take 1 tablet (112 mcg total) by mouth daily. Patient not taking: Reported on 03/17/2022 10/21/21   Bennie Pierini, FNP  metFORMIN (GLUCOPHAGE) 1000 MG tablet TAKE 1 TABLET BY MOUTH 2 (TWO) TIMES DAILY WITH A MEAL. (NEEDS TO BE SEEN BEFORE NEXT REFILL) 11/18/21   Bennie Pierini, FNP  Multiple Vitamin (MULTIVITAMIN WITH MINERALS) TABS tablet Take 1 tablet by mouth daily.    [provider]  nitroGLYCERIN (NITROSTAT) 0.4 MG SL tablet Place 1 tablet (0.4 mg total) under the tongue every 5 (five) minutes as needed for chest pain (Do not give more than 3 SL tablets in 15 minutes.). 12/07/20   Rai, Delene Ruffini, MD  Oxycodone HCl 10 MG TABS Take 10 mg by mouth every 4 (four) hours as needed for pain. 03/01/22   [provider]  polyethylene glycol (MIRALAX / GLYCOLAX) 17 g packet Take 17 g by mouth daily as needed for mild constipation. 03/04/20   Meuth, Brooke A, PA-C  senna-docusate (SENOKOT-S) 8.6-50 MG tablet Take 1 tablet by mouth daily.    [provider]  sertraline (ZOLOFT) 100 MG tablet Take 1 tablet (100 mg total) by mouth daily. 02/23/22   Daphine Deutscher [name], FNP  traZODone (DESYREL) 100 MG tablet Take 1 tablet (100 mg total) by mouth at bedtime. Patient taking differently: Take 100 mg by mouth at bedtime as needed for sleep.  01/18/22   Daphine Deutscher, [name], FNP  VITAMIN D PO Take 1 tablet by mouth daily.    [provider]  warfarin (COUMADIN) 5 MG tablet Take 1 tablet (5 mg total) by mouth daily. 01/18/22   Bennie Pierini, FNP      Allergies    Apresoline [hydralazine], Crestor [rosuvastatin], Lipitor [atorvastatin], Praluent [alirocumab], Ace inhibitors, Benadryl [diphenhydramine hcl], Diovan [valsartan], Imdur [isosorbide dinitrate], Lopressor [metoprolol], and Nsaids    Review of Systems   Review of Systems  Constitutional:  Negative for chills and fever.  HENT:  Negative for ear pain and sore throat.   Eyes:  Negative for pain and visual disturbance.  Respiratory:  Negative for cough and shortness of breath.   Cardiovascular:  Negative for chest pain and palpitations.  Gastrointestinal:  Negative for abdominal pain and vomiting.  Genitourinary:  Negative for dysuria and hematuria.  Musculoskeletal:  Negative for arthralgias and back pain.  Skin:  Negative for color change and rash.  Neurological:  Negative for seizures and syncope.  Psychiatric/Behavioral:  Positive for confusion and dysphoric mood.   All other systems reviewed and are negative.   Physical Exam Updated Vital Signs BP (!) 122/92   Pulse (!) 55   Temp 98.9 F (37.2 C)   Resp 17   Ht 1.676 m (5\' 6" )   Wt 95.2 kg   SpO2 100%   BMI 33.88 kg/m  Physical Exam Vitals and nursing note reviewed.  Constitutional:      General: He is not in acute distress.    Appearance: Normal appearance. He is well-developed.  HENT:     Head: Normocephalic and atraumatic.  Eyes:     Conjunctiva/sclera: Conjunctivae normal.     Pupils: Pupils are equal, round, and reactive to light.  Cardiovascular:     Rate and Rhythm: Normal rate and regular rhythm.     Heart sounds: No murmur heard. Pulmonary:     Effort: Pulmonary effort is normal. No respiratory distress.     Breath sounds: Normal breath sounds.  Abdominal:      Palpations: Abdomen is soft.     Tenderness: There is no abdominal tenderness.  Musculoskeletal:        General: No swelling.     Cervical back: Neck supple.  Skin:    General: Skin is warm and dry.     Capillary Refill: Capillary refill takes less than 2 seconds.  Neurological:     General: No focal deficit present.     Mental Status: He is alert.     Cranial Nerves: No cranial nerve deficit.     Sensory: No sensory deficit.     Motor: No weakness.     Comments: Patient oriented to self and place  Psychiatric:        Mood and Affect: Mood normal.     ED Results / Procedures / Treatments   Labs (all labs ordered are listed, but only  abnormal results are displayed) Labs Reviewed  COMPREHENSIVE METABOLIC PANEL - Abnormal; Notable for the following components:      Result Value   Glucose, Bld 164 (*)    Creatinine, Ser 1.91 (*)    GFR, Estimated 37 (*)    All other components within normal limits  CBC - Abnormal; Notable for the following components:   RBC 4.14 (*)    Hemoglobin 12.1 (*)    HCT 37.3 (*)    RDW 16.1 (*)    All other components within normal limits  URINALYSIS, ROUTINE W REFLEX MICROSCOPIC - Abnormal; Notable for the following components:   APPearance HAZY (*)    Protein, ur TRACE (*)    All other components within normal limits    EKG None  Radiology DG Chest Port 1 View  Result Date: 03/17/2022 CLINICAL DATA:  Altered mental status, status post Bentall procedure EXAM: PORTABLE CHEST 1 VIEW COMPARISON:  None Available. FINDINGS: Lung volumes are small, but are symmetric and are clear. No pneumothorax or pleural effusion. Aortic valve replacement has been performed. Cardiac size is within normal limits when accounting for poor pulmonary insufflation. Right subclavian pacemaker is in place with single lead overlying the right atrium and 2 leads overlying the right ventricle, unchanged. Pulmonary vascularity is normal. Healed left rib fractures noted. No  acute bone abnormality. IMPRESSION: 1. Pulmonary hypoinflation. 2. Status post aortic valve replacement. Electronically Signed   By: Fidela Salisbury M.D.   On: 03/17/2022 21:50   CT Head Wo Contrast  Result Date: 03/17/2022 CLINICAL DATA:  Altered mental status. EXAM: CT HEAD WITHOUT CONTRAST TECHNIQUE: Contiguous axial images were obtained from the base of the skull through the vertex without intravenous contrast. RADIATION DOSE REDUCTION: This exam was performed according to the departmental dose-optimization program which includes automated exposure control, adjustment of the mA and/or kV according to patient size and/or use of iterative reconstruction technique. COMPARISON:  Head CT dated 01/14/2021. FINDINGS: Brain: Mild age-related atrophy and chronic microvascular ischemic changes. There is no acute intracranial hemorrhage. No mass effect or midline shift. No extra-axial fluid collection. Vascular: No hyperdense vessel or unexpected calcification. Skull: Normal. Negative for fracture or focal lesion. Sinuses/Orbits: Multifocal periosteal thickening of paranasal sinuses. No air-fluid. The mastoid air cells are clear. Other: None IMPRESSION: 1. No acute intracranial pathology. 2. Mild age-related atrophy and chronic microvascular ischemic changes. Electronically Signed   By: Anner Crete M.D.   On: 03/17/2022 21:44    Procedures Procedures    Medications Ordered in ED Medications - No data to display  ED Course/ Medical Decision Making/ A&P                           Medical Decision Making Amount and/or Complexity of Data Reviewed Labs: ordered. Radiology: ordered.   Based on the complaint General work-up done.  Urinalysis negative for urinary tract infection.  Complete metabolic panel shows a GFR 37 which is worse than where he has been but ED was also low with his GFR's in May.  CBC no leukocytosis hemoglobin 12.1 platelets are normal.  Chest x-ray negative and CT head without any  acute findings.  Patient has been cooperative here can follow commands.  Family does admit that he is doing better currently.  Patient stable for discharge home follow-up with primary care doctor for checking the chronic kidney disease and for further evaluation of potential early dementia.   Final Clinical Impression(s) / ED  Diagnoses Final diagnoses:  Dementia without behavioral disturbance, psychotic disturbance, mood disturbance, or anxiety, unspecified dementia severity, unspecified dementia type ([place])  Stage 3b chronic kidney disease [facility])    Rx / DC Orders ED Discharge Orders     None         Fredia Sorrow, MD 03/17/22 2303

## 2022-03-17 NOTE — Progress Notes (Signed)
Called pt this afternoon when he did not show for a lab appt at Dr. Lubertha Basque office. It was around 5 PM when I called him. When I told him who I was he stated that he was "trying to get over there for his blood work". I asked him where he was and he said "[facility] on [address]". I asked him why he was at [facility] and he couldn't seem to tell me. He said he would be home in a few minutes because he lived close to [facility]. I told him I would call him back once he got home. I called back at 5:30 and he told me he was still at [facility] and he was waiting on his ride. I asked him who was coming to get him and he said "that ambulance guy". I asked him why was the ambulance guy coming to get him and he said "to take me home". I asked him if his son, Molli Hazard lived in [place] and he said  yes but he had not talked to him today. I called Molli Hazard and spoke with him because I was concerned that the pt was having some confusion. Molli Hazard states that he and his family have been noticing changes in him, some days he's alert and other days he's confused. He says he's not sure if his Dad is taking his medications and seems to be worse when he hasn't taken them. I gave Molli Hazard the pre-op instructions and told him to make sure he takes the Plavix and Warfarin out of pt's pill box for today, Saturday and Sunday and Monday. Molli Hazard told he would go over to his Dad's now and check on him. I told him if he thought his Dad was worse than normal he might want to take him to the ED. Molli Hazard said that he had wondered if he needed to do that and then asked me if I thought he should take him. I told him that yes, I think that would be a good idea. I told him his dad might have an UTI or another type of infection and that could cause confusion. I suggested that he take him to ED on [address] and let them check him out. Molli Hazard states he will do that.  I called Dr. Ladona Ridgel and made him aware of what has gone on with the patient today and  the he was probably going be in the ED this evening. Dr. Ladona Ridgel said he thought it was a good idea also and he is on call, so if pt is admitted, he will see him.

## 2022-03-17 NOTE — Pre-Procedure Instructions (Signed)
Instructed patient on the following items: Arrival time 1230 Nothing to eat or drink after midnight No meds AM of procedure Responsible person to drive you home and stay with you for 24 hrs Wash with special soap night before and morning of procedure If on anti-coagulant drug instructions Hold  Plavix and Coumadin after today's dose until after procedure.

## 2022-03-17 NOTE — Anesthesia Preprocedure Evaluation (Signed)
Anesthesia Evaluation    Reviewed: Allergy & Precautions, Patient's Chart, lab work & pertinent test results, Unable to perform ROS - Chart review only  Airway        Dental   Pulmonary neg pulmonary ROS,           Cardiovascular hypertension, Pt. on medications + CAD, + Past MI and + Cardiac Stents  + dysrhythmias Atrial Fibrillation + pacemaker + Valvular Problems/Murmurs   Echo 12/2020 1. There is prominent apex-to base and septal-lateral left ventricular dyssynchrony due to RV apical pacing. Left ventricular ejection fraction, by estimation, is 60 to 65%. The left ventricle has normal function. The left ventricle has no regional wall motion abnormalities. There is moderate concentric left ventricular hypertrophy. Left ventricular diastolic parameters are consistent with Grade II diastolic dysfunction (pseudonormalization). Elevated left atrial pressure.  2. Right ventricular systolic function is normal. The right ventricular size is mildly enlarged.  3. Left atrial size was severely dilated.  4. Right atrial size was mildly dilated.  5. The mitral valve is normal in structure. No evidence of mitral valve regurgitation.  6. The aortic valve has been repaired/replaced. Aortic valve regurgitation is not visualized. Moderate to severe aortic valve stenosis. There is a 21 mm [employer]. Jude valve present in the aortic position. Procedure Date: 2010. Aortic valve mean gradient measures 28.0 mmHg. Aortic valve Vmax measures 3.60 m/s.  7. Aortic root/ascending aorta has been repaired/replaced.    Echo 12/2020 ? RPDA lesion is 80% stenosed. ? Prox RCA to Mid RCA lesion is 80% stenosed. ? 1st Diag lesion is 75% stenosed. ? Prox Cx to Mid Cx lesion is 90% stenosed. ? Prox Cx lesion is 50% stenosed. ? A drug-eluting stent was successfully placed using a STENT RESOLUTE ONYX 3.0X18. ? Post intervention, there is a 0% residual stenosis. ? A  drug-eluting stent was successfully placed using a STENT RESOLUTE ONYX 3.0X18. ? Post intervention, there is a 0% residual stenosis. ? Post intervention, there is a 0% residual stenosis.   Pace maker interrogation (02/2022) AS-VP 16%, AP-VP 86%    Neuro/Psych PSYCHIATRIC DISORDERS Anxiety Depression  Neuromuscular disease    GI/Hepatic negative GI ROS, Neg liver ROS,   Endo/Other  diabetesHypothyroidism   Renal/GU Renal disease     Musculoskeletal  (+) Arthritis ,   Abdominal (+) + obese,   Peds  Hematology negative hematology ROS (+)   Anesthesia Other Findings   Reproductive/Obstetrics                           Anesthesia Physical Anesthesia Plan  ASA: 4  Anesthesia Plan: General   Post-op Pain Management: Minimal or no pain anticipated   Induction: Intravenous  PONV Risk Score and Plan: 2 and Ondansetron, Dexamethasone and Treatment may vary due to age or medical condition  Airway Management Planned: Oral ETT  Additional Equipment: Arterial line  Intra-op Plan:   Post-operative Plan: Extubation in OR  Informed Consent:   Plan Discussed with:   Anesthesia Plan Comments: (PAT note written 03/17/2022 by Shonna Chock, PA-C. )       Anesthesia Quick Evaluation

## 2022-03-20 ENCOUNTER — Other Ambulatory Visit (HOSPITAL_COMMUNITY): Payer: Self-pay | Admitting: Internal Medicine

## 2022-03-20 ENCOUNTER — Telehealth: Payer: Self-pay | Admitting: Nurse Practitioner

## 2022-03-20 ENCOUNTER — Inpatient Hospital Stay (HOSPITAL_COMMUNITY)
Admission: RE | Admit: 2022-03-20 | Discharge: 2022-03-24 | DRG: 041 | Disposition: A | Discharge status: Home / Self Care | Payer: Medicare HMO | Attending: Family Medicine | Admitting: Family Medicine

## 2022-03-20 ENCOUNTER — Encounter (HOSPITAL_COMMUNITY)
Admission: RE | Disposition: A | Discharge status: Home / Self Care | Payer: Medicare HMO | Source: Home / Self Care | Attending: Internal Medicine

## 2022-03-20 ENCOUNTER — Other Ambulatory Visit: Payer: Self-pay

## 2022-03-20 DIAGNOSIS — E785 Hyperlipidemia, unspecified: Secondary | ICD-10-CM | POA: Diagnosis present

## 2022-03-20 DIAGNOSIS — I130 Hypertensive heart and chronic kidney disease with heart failure and stage 1 through stage 4 chronic kidney disease, or unspecified chronic kidney disease: Secondary | ICD-10-CM | POA: Diagnosis present

## 2022-03-20 DIAGNOSIS — G309 Alzheimer's disease, unspecified: Principal | ICD-10-CM | POA: Diagnosis present

## 2022-03-20 DIAGNOSIS — E1122 Type 2 diabetes mellitus with diabetic chronic kidney disease: Secondary | ICD-10-CM | POA: Diagnosis present

## 2022-03-20 DIAGNOSIS — F419 Anxiety disorder, unspecified: Secondary | ICD-10-CM | POA: Diagnosis present

## 2022-03-20 DIAGNOSIS — I252 Old myocardial infarction: Secondary | ICD-10-CM

## 2022-03-20 DIAGNOSIS — Z952 Presence of prosthetic heart valve: Secondary | ICD-10-CM

## 2022-03-20 DIAGNOSIS — I2510 Atherosclerotic heart disease of native coronary artery without angina pectoris: Secondary | ICD-10-CM | POA: Diagnosis present

## 2022-03-20 DIAGNOSIS — G510 Bell's palsy: Secondary | ICD-10-CM | POA: Diagnosis present

## 2022-03-20 DIAGNOSIS — Z8249 Family history of ischemic heart disease and other diseases of the circulatory system: Secondary | ICD-10-CM

## 2022-03-20 DIAGNOSIS — E669 Obesity, unspecified: Secondary | ICD-10-CM | POA: Diagnosis present

## 2022-03-20 DIAGNOSIS — G2581 Restless legs syndrome: Secondary | ICD-10-CM | POA: Diagnosis present

## 2022-03-20 DIAGNOSIS — E039 Hypothyroidism, unspecified: Secondary | ICD-10-CM | POA: Diagnosis present

## 2022-03-20 DIAGNOSIS — E876 Hypokalemia: Secondary | ICD-10-CM | POA: Diagnosis present

## 2022-03-20 DIAGNOSIS — M1990 Unspecified osteoarthritis, unspecified site: Secondary | ICD-10-CM | POA: Diagnosis present

## 2022-03-20 DIAGNOSIS — Z79890 Hormone replacement therapy (postmenopausal): Secondary | ICD-10-CM

## 2022-03-20 DIAGNOSIS — Z45010 Encounter for checking and testing of cardiac pacemaker pulse generator [battery]: Secondary | ICD-10-CM

## 2022-03-20 DIAGNOSIS — N1832 Chronic kidney disease, stage 3b: Secondary | ICD-10-CM | POA: Diagnosis present

## 2022-03-20 DIAGNOSIS — F1027 Alcohol dependence with alcohol-induced persisting dementia: Secondary | ICD-10-CM | POA: Diagnosis present

## 2022-03-20 DIAGNOSIS — F05 Delirium due to known physiological condition: Secondary | ICD-10-CM | POA: Diagnosis present

## 2022-03-20 DIAGNOSIS — Z7902 Long term (current) use of antithrombotics/antiplatelets: Secondary | ICD-10-CM

## 2022-03-20 DIAGNOSIS — Z7901 Long term (current) use of anticoagulants: Secondary | ICD-10-CM

## 2022-03-20 DIAGNOSIS — I480 Paroxysmal atrial fibrillation: Secondary | ICD-10-CM | POA: Diagnosis present

## 2022-03-20 DIAGNOSIS — I5032 Chronic diastolic (congestive) heart failure: Secondary | ICD-10-CM | POA: Diagnosis present

## 2022-03-20 DIAGNOSIS — Z96642 Presence of left artificial hip joint: Secondary | ICD-10-CM | POA: Diagnosis present

## 2022-03-20 DIAGNOSIS — Z955 Presence of coronary angioplasty implant and graft: Secondary | ICD-10-CM

## 2022-03-20 DIAGNOSIS — I442 Atrioventricular block, complete: Secondary | ICD-10-CM | POA: Diagnosis not present

## 2022-03-20 DIAGNOSIS — Z01818 Encounter for other preprocedural examination: Principal | ICD-10-CM

## 2022-03-20 DIAGNOSIS — Z6833 Body mass index (BMI) 33.0-33.9, adult: Secondary | ICD-10-CM

## 2022-03-20 DIAGNOSIS — Z79899 Other long term (current) drug therapy: Secondary | ICD-10-CM

## 2022-03-20 DIAGNOSIS — F4310 Post-traumatic stress disorder, unspecified: Secondary | ICD-10-CM | POA: Diagnosis present

## 2022-03-20 DIAGNOSIS — Z886 Allergy status to analgesic agent status: Secondary | ICD-10-CM

## 2022-03-20 DIAGNOSIS — Z79891 Long term (current) use of opiate analgesic: Secondary | ICD-10-CM

## 2022-03-20 DIAGNOSIS — Z7984 Long term (current) use of oral hypoglycemic drugs: Secondary | ICD-10-CM

## 2022-03-20 DIAGNOSIS — H5461 Unqualified visual loss, right eye, normal vision left eye: Secondary | ICD-10-CM | POA: Diagnosis present

## 2022-03-20 DIAGNOSIS — F02818 Dementia in other diseases classified elsewhere, unspecified severity, with other behavioral disturbance: Secondary | ICD-10-CM | POA: Diagnosis present

## 2022-03-20 DIAGNOSIS — Z888 Allergy status to other drugs, medicaments and biological substances status: Secondary | ICD-10-CM

## 2022-03-20 DIAGNOSIS — D631 Anemia in chronic kidney disease: Secondary | ICD-10-CM | POA: Diagnosis present

## 2022-03-20 DIAGNOSIS — Z950 Presence of cardiac pacemaker: Secondary | ICD-10-CM

## 2022-03-20 HISTORY — PX: LEAD EXTRACTION: EP1211

## 2022-03-20 HISTORY — DX: Paroxysmal atrial fibrillation: I48.0

## 2022-03-20 HISTORY — DX: Presence of cardiac pacemaker: Z95.0

## 2022-03-20 LAB — GLUCOSE, CAPILLARY: Glucose-Capillary: 143 mg/dL — ABNORMAL HIGH (ref 70–99)

## 2022-03-20 LAB — PROTIME-INR
INR: 1.2 (ref 0.8–1.2)
Prothrombin Time: 15.3 seconds — ABNORMAL HIGH (ref 11.4–15.2)

## 2022-03-20 LAB — SURGICAL PCR SCREEN
MRSA, PCR: NEGATIVE
Staphylococcus aureus: POSITIVE — AB

## 2022-03-20 LAB — PREPARE RBC (CROSSMATCH)

## 2022-03-20 SURGERY — LEAD EXTRACTION
Anesthesia: General

## 2022-03-20 MED ORDER — WARFARIN - PHYSICIAN DOSING INPATIENT: Freq: Every day | Status: DC

## 2022-03-20 MED ORDER — LACTASE 3000 UNITS PO TABS
9000.0000 [IU] | ORAL_TABLET | Freq: Every day | ORAL | Status: DC | PRN
Start: 2022-03-20 — End: 2022-03-24

## 2022-03-20 MED ORDER — TRAZODONE HCL 50 MG PO TABS
100.0000 mg | ORAL_TABLET | Freq: Every day | ORAL | Status: DC
  Administered 2022-03-20 – 2022-03-23 (×4): 100 mg via ORAL
  Filled 2022-03-20 (×4): qty 2

## 2022-03-20 MED ORDER — ACETAMINOPHEN 325 MG PO TABS: 325.0000 mg | ORAL_TABLET | ORAL | Status: DC | PRN

## 2022-03-20 MED ORDER — SODIUM CHLORIDE 0.9 % IV SOLN
INTRAVENOUS | Status: AC
  Filled 2022-03-20: qty 2

## 2022-03-20 MED ORDER — HEPARIN (PORCINE) IN NACL 1000-0.9 UT/500ML-% IV SOLN
INTRAVENOUS | Status: DC | PRN
  Administered 2022-03-20: 500 mL

## 2022-03-20 MED ORDER — SODIUM CHLORIDE 0.9 % IV SOLN: INTRAVENOUS | Status: DC

## 2022-03-20 MED ORDER — POVIDONE-IODINE 10 % EX SWAB: 2.0000 "application " | Freq: Once | CUTANEOUS | Status: DC

## 2022-03-20 MED ORDER — INSULIN ASPART 100 UNIT/ML IJ SOLN: 0.0000 [IU] | INTRAMUSCULAR | Status: DC | PRN

## 2022-03-20 MED ORDER — CEFAZOLIN SODIUM-DEXTROSE 2-4 GM/100ML-% IV SOLN
INTRAVENOUS | Status: AC
  Filled 2022-03-20: qty 100

## 2022-03-20 MED ORDER — CHLORHEXIDINE GLUCONATE 4 % EX LIQD: 4.0000 | Freq: Once | CUTANEOUS | Status: DC

## 2022-03-20 MED ORDER — LEVOTHYROXINE SODIUM 100 MCG PO TABS: 100.0000 ug | ORAL_TABLET | Freq: Every day | ORAL | Status: DC

## 2022-03-20 MED ORDER — WARFARIN SODIUM 5 MG PO TABS
5.0000 mg | ORAL_TABLET | Freq: Every day | ORAL | Status: DC
  Administered 2022-03-20: 5 mg via ORAL
  Filled 2022-03-20: qty 1

## 2022-03-20 MED ORDER — SODIUM CHLORIDE 0.9% IV SOLUTION: Freq: Once | INTRAVENOUS | Status: DC

## 2022-03-20 MED ORDER — IODIXANOL 320 MG/ML IV SOLN
INTRAVENOUS | Status: DC | PRN
  Administered 2022-03-20: 15 mL

## 2022-03-20 MED ORDER — LEVOTHYROXINE SODIUM 112 MCG PO TABS
112.0000 ug | ORAL_TABLET | Freq: Every day | ORAL | Status: DC
Start: 2022-03-21 — End: 2022-03-24
  Administered 2022-03-22 – 2022-03-24 (×3): 112 ug via ORAL
  Filled 2022-03-20 (×3): qty 1

## 2022-03-20 MED ORDER — CEFAZOLIN SODIUM-DEXTROSE 1-4 GM/50ML-% IV SOLN
1.0000 g | Freq: Once | INTRAVENOUS | Status: AC
  Administered 2022-03-20: 1 g via INTRAVENOUS
  Filled 2022-03-20 (×2): qty 50

## 2022-03-20 MED ORDER — FENTANYL CITRATE (PF) 100 MCG/2ML IJ SOLN
INTRAMUSCULAR | Status: DC | PRN
Start: 2022-03-20 — End: 2022-03-20
  Administered 2022-03-20: 12.5 ug via INTRAVENOUS

## 2022-03-20 MED ORDER — LORAZEPAM 2 MG/ML IJ SOLN
0.5000 mg | Freq: Three times a day (TID) | INTRAMUSCULAR | Status: DC | PRN
  Administered 2022-03-21: 0.5 mg via INTRAVENOUS
  Filled 2022-03-20: qty 1

## 2022-03-20 MED ORDER — LIDOCAINE HCL (PF) 1 % IJ SOLN
INTRAMUSCULAR | Status: AC
  Filled 2022-03-20: qty 60

## 2022-03-20 MED ORDER — CEFAZOLIN SODIUM-DEXTROSE 2-4 GM/100ML-% IV SOLN
2.0000 g | INTRAVENOUS | Status: AC
  Administered 2022-03-20: 2 g via INTRAVENOUS
  Filled 2022-03-20: qty 100

## 2022-03-20 MED ORDER — ONDANSETRON HCL 4 MG/2ML IJ SOLN: 4.0000 mg | Freq: Four times a day (QID) | INTRAMUSCULAR | Status: DC | PRN

## 2022-03-20 MED ORDER — FENTANYL CITRATE (PF) 100 MCG/2ML IJ SOLN
INTRAMUSCULAR | Status: AC
  Filled 2022-03-20: qty 2

## 2022-03-20 MED ORDER — ACETAMINOPHEN 500 MG PO TABS
500.0000 mg | ORAL_TABLET | Freq: Four times a day (QID) | ORAL | Status: DC
  Administered 2022-03-20 – 2022-03-24 (×9): 500 mg via ORAL
  Filled 2022-03-20 (×10): qty 1

## 2022-03-20 MED ORDER — SODIUM CHLORIDE 0.9 % IV SOLN
80.0000 mg | INTRAVENOUS | Status: AC
  Administered 2022-03-20: 80 mg

## 2022-03-20 MED ORDER — LIDOCAINE HCL (PF) 1 % IJ SOLN
INTRAMUSCULAR | Status: DC | PRN
  Administered 2022-03-20: 60 mL

## 2022-03-20 MED ORDER — FUROSEMIDE 40 MG PO TABS
40.0000 mg | ORAL_TABLET | Freq: Every day | ORAL | Status: DC
  Administered 2022-03-20 – 2022-03-23 (×4): 40 mg via ORAL
  Filled 2022-03-20 (×4): qty 1

## 2022-03-20 MED ORDER — ADULT MULTIVITAMIN W/MINERALS CH
1.0000 | ORAL_TABLET | Freq: Every day | ORAL | Status: DC
  Administered 2022-03-20 – 2022-03-24 (×5): 1 via ORAL
  Filled 2022-03-20 (×5): qty 1

## 2022-03-20 MED ORDER — POVIDONE-IODINE 10 % EX SWAB: 2.0000 | Freq: Once | CUTANEOUS | Status: DC

## 2022-03-20 MED ORDER — BUPROPION HCL ER (XL) 150 MG PO TB24
150.0000 mg | ORAL_TABLET | Freq: Every day | ORAL | Status: DC
Start: 2022-03-20 — End: 2022-03-23
  Administered 2022-03-20 – 2022-03-23 (×4): 150 mg via ORAL
  Filled 2022-03-20 (×4): qty 1

## 2022-03-20 MED ORDER — OXYCODONE HCL 5 MG PO TABS
10.0000 mg | ORAL_TABLET | ORAL | Status: DC | PRN
  Administered 2022-03-20 – 2022-03-23 (×7): 10 mg via ORAL
  Filled 2022-03-20 (×8): qty 2

## 2022-03-20 MED ORDER — SERTRALINE HCL 100 MG PO TABS
100.0000 mg | ORAL_TABLET | Freq: Every day | ORAL | Status: DC
  Administered 2022-03-20 – 2022-03-24 (×5): 100 mg via ORAL
  Filled 2022-03-20 (×5): qty 1

## 2022-03-20 MED ORDER — POLYETHYLENE GLYCOL 3350 17 G PO PACK: 17.0000 g | PACK | Freq: Every day | ORAL | Status: DC | PRN

## 2022-03-20 MED ORDER — SENNOSIDES-DOCUSATE SODIUM 8.6-50 MG PO TABS
1.0000 | ORAL_TABLET | Freq: Every day | ORAL | Status: DC
  Administered 2022-03-21 – 2022-03-22 (×2): 1 via ORAL
  Filled 2022-03-20 (×4): qty 1

## 2022-03-20 SURGICAL SUPPLY — 9 items
CABLE SURGICAL S-101-97-12 (CABLE) ×1 IMPLANT
KIT MICROPUNCTURE NIT STIFF (SHEATH) IMPLANT
PACEMAKER ASSURITY DR-RF (Pacemaker) ×1 IMPLANT
PAD DEFIB RADIO PHYSIO CONN (PAD) ×1 IMPLANT
POUCH AIGIS-R ANTIBACT PPM (Mesh General) ×2 IMPLANT
POUCH AIGIS-R ANTIBACT PPM MED (Mesh General) IMPLANT
SHEATH 8FR PRELUDE SNAP 13 (SHEATH) IMPLANT
TRAY PACEMAKER INSERTION (PACKS) ×1 IMPLANT
WIRE MICRO SET SILHO 5FR 7 (SHEATH) IMPLANT

## 2022-03-20 NOTE — Progress Notes (Addendum)
RN went into room after hearing bed alarm go off. Patient standing by bed putting on his shorts with gown tangle in tele cords. Patient stated had to to bathroom. NT and RN removed gown. RN asked patient to get back in bed. Patient said " direct me to the bathroom."  RN asked patient again to get back in bed. Patient said "absolutely not" and proceeded to walk out of room. RN attempted to redirect patient back into. Patient became combative swinging his elbows. Tell RN and NT not to touch him.  Charge nurse notified. Patient proceed to go out of unit back door. RN proceeded follow and attempt redirect patient. Patient continue to be combative and quickly through waiting and into neighboring unit. Patient on the phone with 911 stating "his rights were being taken away." Any attempt to redirect by nursing staff patient swatting a staff. Security called. Patient secured a nursing desk by unit staff & neighboring units staff until security and [employer] arrived. Patient suspicious of hospital security asking for a "real cop." [employer] present as was able escort patient back to unit.  Patient refuse to allow RN or NT to be placed back on tele monitor. AC, PA on call, and patient so  all made aware of situation. Patient in room with family on the way.  18:25 Patients son at bedside. Patient more calm. Still refusing to be placed on heart monitor.

## 2022-03-20 NOTE — Telephone Encounter (Signed)
Son, Andre Malone, is calling to speak with MMM regarding father, Ashur.  He is having delusional episodes with lapses in memory, doesn't know dates or months, not sure he is taking medicines.  They are very worried and would like to talk to MMM about this.  They took him to ER on Friday, but nothing was found.  They did blood work and Veterinary surgeon.  Would someone please call.
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
## 2022-03-21 ENCOUNTER — Encounter (HOSPITAL_COMMUNITY): Payer: Self-pay | Admitting: Internal Medicine

## 2022-03-21 ENCOUNTER — Observation Stay (HOSPITAL_COMMUNITY): Payer: Medicare HMO

## 2022-03-21 DIAGNOSIS — Z952 Presence of prosthetic heart valve: Secondary | ICD-10-CM | POA: Diagnosis not present

## 2022-03-21 DIAGNOSIS — R4182 Altered mental status, unspecified: Secondary | ICD-10-CM

## 2022-03-21 DIAGNOSIS — F02818 Dementia in other diseases classified elsewhere, unspecified severity, with other behavioral disturbance: Secondary | ICD-10-CM | POA: Diagnosis present

## 2022-03-21 DIAGNOSIS — E1122 Type 2 diabetes mellitus with diabetic chronic kidney disease: Secondary | ICD-10-CM | POA: Diagnosis not present

## 2022-03-21 DIAGNOSIS — D631 Anemia in chronic kidney disease: Secondary | ICD-10-CM | POA: Diagnosis not present

## 2022-03-21 DIAGNOSIS — G2581 Restless legs syndrome: Secondary | ICD-10-CM | POA: Diagnosis not present

## 2022-03-21 DIAGNOSIS — N1832 Chronic kidney disease, stage 3b: Secondary | ICD-10-CM | POA: Diagnosis not present

## 2022-03-21 DIAGNOSIS — E876 Hypokalemia: Secondary | ICD-10-CM | POA: Diagnosis present

## 2022-03-21 DIAGNOSIS — M1990 Unspecified osteoarthritis, unspecified site: Secondary | ICD-10-CM | POA: Diagnosis present

## 2022-03-21 DIAGNOSIS — F1027 Alcohol dependence with alcohol-induced persisting dementia: Secondary | ICD-10-CM | POA: Diagnosis not present

## 2022-03-21 DIAGNOSIS — E669 Obesity, unspecified: Secondary | ICD-10-CM | POA: Diagnosis present

## 2022-03-21 DIAGNOSIS — I442 Atrioventricular block, complete: Secondary | ICD-10-CM | POA: Diagnosis not present

## 2022-03-21 DIAGNOSIS — F03918 Unspecified dementia, unspecified severity, with other behavioral disturbance: Secondary | ICD-10-CM

## 2022-03-21 DIAGNOSIS — I2510 Atherosclerotic heart disease of native coronary artery without angina pectoris: Secondary | ICD-10-CM | POA: Diagnosis not present

## 2022-03-21 DIAGNOSIS — I480 Paroxysmal atrial fibrillation: Secondary | ICD-10-CM

## 2022-03-21 DIAGNOSIS — E785 Hyperlipidemia, unspecified: Secondary | ICD-10-CM | POA: Diagnosis present

## 2022-03-21 DIAGNOSIS — I5032 Chronic diastolic (congestive) heart failure: Secondary | ICD-10-CM | POA: Diagnosis not present

## 2022-03-21 DIAGNOSIS — F4310 Post-traumatic stress disorder, unspecified: Secondary | ICD-10-CM | POA: Diagnosis present

## 2022-03-21 DIAGNOSIS — G309 Alzheimer's disease, unspecified: Secondary | ICD-10-CM | POA: Diagnosis not present

## 2022-03-21 DIAGNOSIS — F419 Anxiety disorder, unspecified: Secondary | ICD-10-CM | POA: Diagnosis present

## 2022-03-21 DIAGNOSIS — F05 Delirium due to known physiological condition: Secondary | ICD-10-CM | POA: Diagnosis not present

## 2022-03-21 DIAGNOSIS — I130 Hypertensive heart and chronic kidney disease with heart failure and stage 1 through stage 4 chronic kidney disease, or unspecified chronic kidney disease: Secondary | ICD-10-CM | POA: Diagnosis not present

## 2022-03-21 DIAGNOSIS — R69 Illness, unspecified: Secondary | ICD-10-CM | POA: Diagnosis not present

## 2022-03-21 DIAGNOSIS — E039 Hypothyroidism, unspecified: Secondary | ICD-10-CM | POA: Diagnosis not present

## 2022-03-21 DIAGNOSIS — G510 Bell's palsy: Secondary | ICD-10-CM | POA: Diagnosis present

## 2022-03-21 DIAGNOSIS — Z7901 Long term (current) use of anticoagulants: Secondary | ICD-10-CM | POA: Diagnosis not present

## 2022-03-21 DIAGNOSIS — I252 Old myocardial infarction: Secondary | ICD-10-CM | POA: Diagnosis not present

## 2022-03-21 DIAGNOSIS — H5461 Unqualified visual loss, right eye, normal vision left eye: Secondary | ICD-10-CM | POA: Diagnosis present

## 2022-03-21 LAB — TSH: TSH: 2.565 u[IU]/mL (ref 0.350–4.500)

## 2022-03-21 LAB — HIV ANTIBODY (ROUTINE TESTING W REFLEX): HIV Screen 4th Generation wRfx: NONREACTIVE

## 2022-03-21 LAB — RPR: RPR Ser Ql: NONREACTIVE

## 2022-03-21 LAB — AMMONIA: Ammonia: 23 umol/L (ref 9–35)

## 2022-03-21 LAB — VITAMIN B12: Vitamin B-12: 566 pg/mL (ref 180–914)

## 2022-03-21 LAB — FOLATE: Folate: 12.1 ng/mL (ref >5.9)

## 2022-03-21 MED ORDER — WARFARIN SODIUM 7.5 MG PO TABS
7.5000 mg | ORAL_TABLET | Freq: Once | ORAL | Status: AC
  Administered 2022-03-21: 7.5 mg via ORAL
  Filled 2022-03-21: qty 1

## 2022-03-21 MED ORDER — QUETIAPINE FUMARATE 50 MG PO TABS: 25.0000 mg | ORAL_TABLET | Freq: Two times a day (BID) | ORAL | Status: DC

## 2022-03-21 MED ORDER — QUETIAPINE FUMARATE 50 MG PO TABS
25.0000 mg | ORAL_TABLET | Freq: Two times a day (BID) | ORAL | Status: DC
  Administered 2022-03-21 – 2022-03-24 (×6): 25 mg via ORAL
  Filled 2022-03-21 (×6): qty 1

## 2022-03-21 NOTE — TOC Progression Note (Signed)
Transition of Care [facility]) - Progression Note    Patient Details  Name: Andre Malone MRN: [id number] Date of Birth: 02-08-1951  [facility]) CM/SW Contact  Ivette Loyal, [place] Phone Number: 03/21/2022, 4:51 PM  Clinical Narrative:    CSW spoke with pt son who wanted to inquire about LTC facilities at [place]. CSW updated son on the IVC process and will follow up tomorrow about possible memory care facilities.          Expected Discharge Plan and Services                                                 Social Determinants of Health (SDOH) Interventions    Readmission Risk Interventions    03/05/2020   12:19 PM  Readmission Risk Prevention Plan  Transportation Screening Complete  PCP or Specialist Appt within 5-7 Days Not Complete  Not Complete comments Pt discharging to SNF  Home Care Screening Not Complete  Home Care Screening Not Completed Comments Pt discharging to SNF  Medication Review (RN CM) Complete

## 2022-03-21 NOTE — Progress Notes (Signed)
EEG complete - results pending 

## 2022-03-21 NOTE — Procedures (Signed)
Patient Name: Andre Malone  MRN: [id number]  Epilepsy Attending: Charlsie Quest  Referring Physician/Provider: Gordy Councilman, MD  Date: 03/21/2022 Duration: 21.42 mins  Patient history: 71yo M with ams. EEG to evaluate for seizure.  Level of alertness: Awake  AEDs during EEG study: None  Technical aspects: This EEG study was done with scalp electrodes positioned according to the 10-20 International system of electrode placement. Electrical activity was acquired at a sampling rate of 500Hz  and reviewed with a high frequency filter of 70Hz  and a low frequency filter of 1Hz . EEG data were recorded continuously and digitally stored.   Description: The posterior dominant rhythm consists of 7 Hz activity of moderate voltage (25-35 uV) seen predominantly in posterior head regions, symmetric and reactive to eye opening and eye closing. EEG showed continuous generalized 3 to 6 Hz theta-delta slowing. Hyperventilation and photic stimulation were not performed.     ABNORMALITY - Continuous slow, generalized - Background slow  IMPRESSION: This study is suggestive of moderate diffuse encephalopathy, nonspecific etiology. No seizures or epileptiform discharges were seen throughout the recording.  [name] 

## 2022-03-21 NOTE — Progress Notes (Addendum)
Progress Note  Patient Name: Andre Malone Date of Encounter: 03/21/2022  [facility] Cardiologist: Verne Carrow, MD   Subjective   Sleeping comfortably as we enter the room, wakes easily and is cooperative  Events overnight noted, d/w RN  Inpatient Medications    Scheduled Meds:  sodium chloride   Intravenous Once   acetaminophen  500 mg Oral Q6H   buPROPion  150 mg Oral Daily   furosemide  40 mg Oral Daily   levothyroxine  112 mcg Oral Q0600   multivitamin with minerals  1 tablet Oral Daily   senna-docusate  1 tablet Oral Daily   sertraline  100 mg Oral Daily   traZODone  100 mg Oral QHS   warfarin  5 mg Oral q1600   Warfarin - Physician Dosing Inpatient   Does not apply q1600   Continuous Infusions:  PRN Meds: acetaminophen, lactase, LORazepam, ondansetron (ZOFRAN) IV, oxyCODONE, polyethylene glycol   Vital Signs    Vitals:   03/20/22 1656 03/20/22 1700 03/20/22 1939 03/20/22 2334  BP: 133/75 (!) 156/84 130/68 115/60  Pulse: 69 71 88   Resp: 20 15 20    Temp: 97.6 F (36.4 C)  97.7 F (36.5 C) 97.6 F (36.4 C)  TempSrc: Oral  Oral Oral  SpO2: 96% 91%    Weight: 89.1 kg       Intake/Output Summary (Last 24 hours) at 03/21/2022 0807 Last data filed at 03/20/2022 2300 Gross per 24 hour  Intake 120 ml  Output 0 ml  Net 120 ml      03/20/2022    4:56 PM 03/17/2022    7:33 PM 02/15/2022    8:27 AM  Last 3 Weights  Weight (lbs) 196 lb 6.9 oz 209 lb 14.1 oz 209 lb 12.8 oz  Weight (kg) 89.1 kg 95.2 kg 95.165 kg      Telemetry    Pt refuses telemetry monitoring - Personally Reviewed  ECG    No new EKGs - Personally Reviewed  Physical Exam   GEN: No acute distress.   Neck: No JVD Cardiac: RRR, no murmurs, rubs, or gallops.  Respiratory: Clear to auscultation bilaterally. GI: Soft, nontender, non-distended  MS: No edema; No deformity. Neuro:  no focal motor deficits Psych: AMS    Pacer site is stable. Dressing is CDI  Labs     High Sensitivity Troponin:  No results for input(s): "TROPONINIHS" in the last 720 hours.   Chemistry Recent Labs  Lab 03/17/22 1947  NA 137  K 4.7  CL 103  CO2 26  GLUCOSE 164*  BUN 15  CREATININE 1.91*  CALCIUM 10.0  PROT 7.5  ALBUMIN 4.5  AST 25  ALT 8  ALKPHOS 54  BILITOT 0.7  GFRNONAA 37*  ANIONGAP 8    Lipids No results for input(s): "CHOL", "TRIG", "HDL", "LABVLDL", "LDLCALC", "CHOLHDL" in the last 168 hours.  Hematology Recent Labs  Lab 03/17/22 1947  WBC 6.2  RBC 4.14*  HGB 12.1*  HCT 37.3*  MCV 90.1  MCH 29.2  MCHC 32.4  RDW 16.1*  PLT 155   Thyroid No results for input(s): "TSH", "FREET4" in the last 168 hours.  BNPNo results for input(s): "BNP", "PROBNP" in the last 168 hours.  DDimer No results for input(s): "DDIMER" in the last 168 hours.   Radiology    EP PPM/ICD IMPLANT  Result Date: 03/20/2022 Conclusion: Successful removal of a previously implanted dual-chamber pacemaker which had reached elective replacement, and insertion of a new dual-chamber  and a patient with complete heart block and no ventricular escape. Cristopher Peru, MD    Cardiac Studies   12/07/2020; TTE IMPRESSIONS   1. There is prominent apex-to base and septal-lateral left ventricular  dyssynchrony due to RV apical pacing. Left ventricular ejection fraction,  by estimation, is 60 to 65%. The left ventricle has normal function. The  left ventricle has no regional wall   motion abnormalities. There is moderate concentric left ventricular  hypertrophy. Left ventricular diastolic parameters are consistent with  Grade II diastolic dysfunction (pseudonormalization). Elevated left atrial  pressure.   2. Right ventricular systolic function is normal. The right ventricular  size is mildly enlarged.   3. Left atrial size was severely dilated.   4. Right atrial size was mildly dilated.   5. The mitral valve is normal in structure. No evidence of mitral valve  regurgitation.   6. The  aortic valve has been repaired/replaced. Aortic valve  regurgitation is not visualized. Moderate to severe aortic valve stenosis.  There is a 21 mm St. Jude valve present in the aortic position. Procedure  Date: 2010. Aortic valve mean gradient  measures 28.0 mmHg. Aortic valve Vmax measures 3.60 m/s.   7. Aortic root/ascending aorta has been repaired/replaced.      12/06/2020: LHC/PCI RPDA lesion is 80% stenosed. Prox RCA to Mid RCA lesion is 80% stenosed. 1st Diag lesion is 75% stenosed. Prox Cx to Mid Cx lesion is 90% stenosed. Prox Cx lesion is 50% stenosed. A drug-eluting stent was successfully placed using a STENT RESOLUTE ONYX 3.0X18. Post intervention, there is a 0% residual stenosis. A drug-eluting stent was successfully placed using a STENT RESOLUTE ONYX 3.0X18. Post intervention, there is a 0% residual stenosis. Post intervention, there is a 0% residual stenosis.   IMPRESSION: Successful PCI and drug-eluting stenting of a mid dominant RCA as well as mid AV groove circumflex in the setting of unstable angina.  He is on aspirin Plavix currently.  He is on Coumadin for his valve and therefore will need "triple therapy" for 1 month after which aspirin can be discontinued.  He will need Plavix for at least 1 year uninterrupted.  The sheath was sewn securely in place.  The patient left lab in stable condition.  Once ACT falls below 170 the sheath will be removed and pressure held in the Cath Lab.  Patient Profile     71 y.o. male  with history of CAD (RCA stent 2006, NSTEMI 2014 PDA lesion 2014, too small for intervention >> PCI to RCA/mid AV groove Cx 12/2020), HTN, HLD, DM, VHD w/severe AS and ascending aortic aneurysm (replacement of the valve and root with a 21 mm St Jude Aortic valve conduit by Dr. Cyndia Bent on October 4th 2010 >> pericardial effusion >> window), CHB w/PPM, AFib  He was admitted yesterday for PPM generator change with possible A lead extraction//revision. No lad  revision was done, A lead is functioning as programmed and given what appears to be quickly advancing dementia (?) and escalating AMS, lead revision (general anesthesia) procedure not pursued    Device information SJM dual chamber PPM implanted 05/17/2009,  New RV lead and gen change 09/17/2013. He has an abandoned RV lead, (2088) from 2010 Generator change 03/20/22 He is pacer dependent  Assessment & Plan    1. PPM     Known A lead noise, measurements are stable     s/p pacemaker generator change yesterday     Site is stable  2. CAD     PCI RCA and mid AV groove cx 12/07/20     On Plavix > hold post gen change 5 days     No symptoms     Unclear allergies: metoprolol (kidney failure) and myalgias with statin, fatigue with praluent     May benefit from lipid clinic referral, this can be addressed out pt        3. Hx of replacement of the valve and root with a 21 mm St Jude Aortic valve conduit     (Mechanical)     Mod-severe AVR stenosis     Warfarin     ok to resume warfarin, avoid rapid rise in INR     NO HEPARIN NO LOVENOX      4. AFib (presumed paroxysmal)     CHA2DS2Vasc is 4,  on warfarin     No tru AF noted     warfarin as above   5. HTN     Looks good  6. AMS Appreciate neurology this AM Recommends psych evaluation and will as IM to the case to help as well Ideally IM service would take on as attending given this is his acute issue currently I have spoken to Dr. Katrinka Blazing, he will consult on the case though leaves decision to take pt on as attending service to medicine rounding team tomorrow, He has consulted psych to the case . Appreciate the assistance Social services on board as well, I suspect he will need SNF going forward  Addend; Medicine raised question of QT on his last EKG V paced rhythm with a very broad QRS of , correcting for this his QTc is  For questions or updates, please contact [facility] Please consult [email or web address] for  contact info under        Signed, Sheilah Pigeon, PA-C  03/21/2022, 8:07 AM    EP attending  Patient seen and examined.  Agree with the findings as noted above.  The patient had significant agitation and sundowning last night.  This has been well documented.  He will not be able to live at home.  He is a danger to himself.  He will need skilled nursing facility.  He is stable from a cardiology perspective for discharge.  No plans for initiation of Seroquel.  Appreciate neurology and primary care/hospital physicians for their help in managing this patient.  He is stable for discharge once a nursing facility bed has been arranged.  Sharrell Ku, MD
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
## 2022-03-21 NOTE — Progress Notes (Signed)
Neurology Progress Note  Patient ID: Andre Malone is a 71 y.o. 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  Subjective: No acute complaints, slightly confused for example reporting that he is here for his kidneys   Exam: Vitals:   03/20/22 2334 03/21/22 1210  BP: 115/60 114/70  Pulse:  60  Resp:    Temp: 97.6 F (36.4 C) 98.4 F (36.9 C)  SpO2:  98%   Gen: In bed, comfortable  Resp: non-labored breathing, no grossly audible wheezing Cardiac: Perfusing extremities well  Abd: soft, nt  Neuro: MS: Awake, alert, oriented to [name], year but not month but able to say it was recently 4 July, able to follow simple commands, not oriented to situation CN: Pupils equal round reactive to light, EOMI, Bell's palsy of the right face with palpebral fissure widening on the right (chronic, though chart review notes a left Bell's palsy), tongue midline Motor: 5/5 strength throughout, participates well but he does have mild pronator drift of the right upper extremity Sensory: Reports this is intact throughout to light touch Coordination: Finger-to-nose and heel-to-shin intact bilaterally  Pertinent Labs:  Basic Metabolic Panel: Recent Labs  Lab 03/17/22 1947  NA 137  K 4.7  CL 103  CO2 26  GLUCOSE 164*  BUN 15  CREATININE 1.91*  CALCIUM 10.0    CBC: Recent Labs  Lab 03/17/22 1947  WBC 6.2  HGB 12.1*  HCT 37.3*  MCV 90.1  PLT 155    Coagulation  Studies: Recent Labs    03/20/22 1325  LABPROT 15.3*  INR 1.2    Lab Results  Component Value Date   FOLATE 12.1 03/21/2022    Lab Results  Component Value Date   VITAMINB12 566 03/21/2022   Lab Results  Component Value Date   TSH 2.565 03/21/2022   T4TOTAL 7.4 01/18/2022   Ammonia 23  HIV negative  RPR in process Thiamine in process  Head CT 7/14 personally reviewed, agree with radiology: 1. No acute intracranial pathology. 2. Mild age-related atrophy and chronic microvascular ischemic changes.   Impression: Waxing and waning mental status and behavior as described concerning for delirium on dementia.  Thus far reversible causes work-up is reassuring, added on RPR and HIV testing to initial recommendations from Dr. Derry Lory.  Can additionally obtain EEG to screen for seizure.  Suspect his pacemaker is not MRI compatible but will confirm with MRI tech.  Do not think there will be much value in repeating CT given that currently his examination is fairly reassuring and not suggestive of a large stroke  Recommendations: -HIV (ordered, resulted negative) -RPR and thiamine in process, treat if needed -EEG pending, neurology will follow-up EEG result and MRI brain if MRI brain can be obtained but otherwise will be available on an as-needed basis -Appreciate psychiatry comanagement of agitated delirium  Brooke Dare MD-PhD [facility] [phone]   No charge same-day progress note
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
## 2022-03-21 NOTE — TOC Progression Note (Signed)
Transition of Care [facility]) - Progression Note    Patient Details  Name: Andre Malone MRN: [id number] Date of Birth: 01-26-1951  [facility]) CM/SW Contact  Beckie Busing, RN Phone [phone]  03/21/2022, 2:29 PM  Clinical Narrative:    [facility] acknowledges consult for assisted living facility placement. TOC will follow and offer resources for family.         Expected Discharge Plan and Services                                                 Social Determinants of Health (SDOH) Interventions    Readmission Risk Interventions    03/05/2020   12:19 PM  Readmission Risk Prevention Plan  Transportation Screening Complete  PCP or Specialist Appt within 5-7 Days Not Complete  Not Complete comments Pt discharging to SNF  Home Care Screening Not Complete  Home Care Screening Not Completed Comments Pt discharging to SNF  Medication Review (RN CM) Complete
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
## 2022-03-22 DIAGNOSIS — I2510 Atherosclerotic heart disease of native coronary artery without angina pectoris: Secondary | ICD-10-CM | POA: Diagnosis not present

## 2022-03-22 DIAGNOSIS — I480 Paroxysmal atrial fibrillation: Secondary | ICD-10-CM | POA: Diagnosis not present

## 2022-03-22 DIAGNOSIS — F1027 Alcohol dependence with alcohol-induced persisting dementia: Secondary | ICD-10-CM | POA: Diagnosis not present

## 2022-03-22 LAB — PROTIME-INR
INR: 1.4 — ABNORMAL HIGH (ref 0.8–1.2)
Prothrombin Time: 17.1 seconds — ABNORMAL HIGH (ref 11.4–15.2)

## 2022-03-22 MED ORDER — WARFARIN SODIUM 5 MG PO TABS
5.0000 mg | ORAL_TABLET | Freq: Once | ORAL | Status: AC
  Administered 2022-03-22: 5 mg via ORAL
  Filled 2022-03-22: qty 1

## 2022-03-22 MED ORDER — THIAMINE HCL 100 MG PO TABS
250.0000 mg | ORAL_TABLET | Freq: Every day | ORAL | Status: DC
Start: 2022-03-22 — End: 2022-03-24
  Administered 2022-03-22 – 2022-03-24 (×3): 250 mg via ORAL
  Filled 2022-03-22 (×3): qty 3

## 2022-03-22 NOTE — TOC Progression Note (Signed)
Transition of Care [facility]) - Progression Note    Patient Details  Name: Andre Malone MRN: [id number] Date of Birth: [birth year]  [facility] CM/SW Contact  Ivette Loyal, [place] Phone Number: 03/22/2022, 4:33 PM  Clinical Narrative:    CSW spoke with pt son and daughter in law about resources for ALFs. Pt daughter in law is already knowledgeable about " [facility]". Pt son informed CSW that pt has [employer] and usually uses the [facility] [place] [facility]. CSW will reach out to the [place] to inform [facility] of [facility] will need. CSW will also send a e-mail to financial services to inquire about qualification for medicaid.         Expected Discharge Plan and Services                                                 Social Determinants of Health (SDOH) Interventions    Readmission Risk Interventions    03/05/2020   12:19 PM  Readmission Risk Prevention Plan  Transportation Screening Complete  PCP or Specialist Appt within 5-7 Days Not Complete  Not Complete comments Pt discharging to SNF  Home Care Screening Not Complete  Home Care Screening Not Completed Comments Pt discharging to SNF  Medication Review (RN CM) Complete
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
## 2022-03-22 NOTE — Plan of Care (Signed)

## 2022-03-22 NOTE — Progress Notes (Signed)
Progress Note  Patient Name: Andre Malone Date of Encounter: 03/22/2022  [facility] Cardiologist: Lauree Chandler, MD     Subjective   Aware of place denies chest pain or shortness of breath Thinks he is in hospital because of an issue with his son, tricked into coming, recognizes the issue with pacemaker when reminded   Inpatient Medications    Scheduled Meds:  sodium chloride   Intravenous Once   acetaminophen  500 mg Oral Q6H   buPROPion  150 mg Oral Daily   furosemide  40 mg Oral Daily   levothyroxine  112 mcg Oral Q0600   multivitamin with minerals  1 tablet Oral Daily   QUEtiapine  25 mg Oral BID   senna-docusate  1 tablet Oral Daily   sertraline  100 mg Oral Daily   traZODone  100 mg Oral QHS   Warfarin - Physician Dosing Inpatient   Does not apply q1600   Continuous Infusions:  PRN Meds: acetaminophen, lactase, LORazepam, ondansetron (ZOFRAN) IV, oxyCODONE, polyethylene glycol   Vital Signs    Vitals:   03/21/22 1606 03/21/22 2012 03/22/22 0059 03/22/22 0720  BP: (!) 116/59 (!) 146/89 (!) 98/58 135/61  Pulse:  69 (!) 57 60  Resp: 18 18 16 16   Temp: 98.6 F (37 C) 97.7 F (36.5 C) 97.9 F (36.6 C) 98.4 F (36.9 C)  TempSrc: Oral Oral Oral Oral  SpO2: 97% 95% 94% 95%  Weight:        Intake/Output Summary (Last 24 hours) at 03/22/2022 0859 Last data filed at 03/21/2022 1950 Gross per 24 hour  Intake 360 ml  Output 2 ml  Net 358 ml       03/20/2022    4:56 PM 03/17/2022    7:33 PM 02/15/2022    8:27 AM  Last 3 Weights  Weight (lbs) 196 lb 6.9 oz 209 lb 14.1 oz 209 lb 12.8 oz  Weight (kg) 89.1 kg 95.2 kg 95.165 kg      Telemetry    Pt refuses telemetry monitoring    ECG    No new EKGs - Personally Reviewed  Physical Exam   Well developed and nourished in no acute distress HENT normal Neck supple  Clear Pocket without hematoma, swelling or tenderness Regular rate and rhythm, no murmurs or gallops Abd-soft with active  BS No Clubbing cyanosis edema Skin-warm and dry A & Oriented  Grossly normal sensory and motor function        Labs    High Sensitivity Troponin:  No results for input(s): "TROPONINIHS" in the last 720 hours.   Chemistry Recent Labs  Lab 03/17/22 1947  NA 137  K 4.7  CL 103  CO2 26  GLUCOSE 164*  BUN 15  CREATININE 1.91*  CALCIUM 10.0  PROT 7.5  ALBUMIN 4.5  AST 25  ALT 8  ALKPHOS 54  BILITOT 0.7  GFRNONAA 37*  ANIONGAP 8     Lipids No results for input(s): "CHOL", "TRIG", "HDL", "LABVLDL", "LDLCALC", "CHOLHDL" in the last 168 hours.  Hematology Recent Labs  Lab 03/17/22 1947  WBC 6.2  RBC 4.14*  HGB 12.1*  HCT 37.3*  MCV 90.1  MCH 29.2  MCHC 32.4  RDW 16.1*  PLT 155    Thyroid  Recent Labs  Lab 03/21/22 0710  TSH 2.565    BNPNo results for input(s): "BNP", "PROBNP" in the last 168 hours.  DDimer No results for input(s): "DDIMER" in the last 168 hours.   Radiology  EEG adult  Result Date: 03/21/2022 Charlsie Quest, MD     03/21/2022  4:41 PM Patient Name: BERNABE DORCE MRN: [id number] Epilepsy Attending: Charlsie Quest Referring Physician/Provider: Gordy Councilman, MD Date: 03/21/2022 Duration: 21.42 mins Patient history: 71yo M with ams. EEG to evaluate for seizure. Level of alertness: Awake AEDs during EEG study: None Technical aspects: This EEG study was done with scalp electrodes positioned according to the 10-20 International system of electrode placement. Electrical activity was acquired at a sampling rate of 500Hz  and reviewed with a high frequency filter of 70Hz  and a low frequency filter of 1Hz . EEG data were recorded continuously and digitally stored. Description: The posterior dominant rhythm consists of 7 Hz activity of moderate voltage (25-35 uV) seen predominantly in posterior head regions, symmetric and reactive to eye opening and eye closing. EEG showed continuous generalized 3 to 6 Hz theta-delta slowing. Hyperventilation and  photic stimulation were not performed.   ABNORMALITY - Continuous slow, generalized - Background slow IMPRESSION: This study is suggestive of moderate diffuse encephalopathy, nonspecific etiology. No seizures or epileptiform discharges were seen throughout the recording.   EP PPM/ICD IMPLANT  Result Date: 03/20/2022 Conclusion: Successful removal of a previously implanted dual-chamber pacemaker which had reached elective replacement, and insertion of a new dual-chamber and a patient with complete heart block and no ventricular escape. , MD    Cardiac Studies   12/07/2020; TTE IMPRESSIONS   1. There is prominent apex-to base and septal-lateral left ventricular  dyssynchrony due to RV apical pacing. Left ventricular ejection fraction,  by estimation, is 60 to 65%. The left ventricle has normal function. The  left ventricle has no regional wall   motion abnormalities. There is moderate concentric left ventricular  hypertrophy. Left ventricular diastolic parameters are consistent with  Grade II diastolic dysfunction (pseudonormalization). Elevated left atrial  pressure.   2. Right ventricular systolic function is normal. The right ventricular  size is mildly enlarged.   3. Left atrial size was severely dilated.   4. Right atrial size was mildly dilated.   5. The mitral valve is normal in structure. No evidence of mitral valve  regurgitation.   6. The aortic valve has been repaired/replaced. Aortic valve  regurgitation is not visualized. Moderate to severe aortic valve stenosis.  There is a 21 mm St. Jude valve present in the aortic position. Procedure  Date: 2010. Aortic valve mean gradient  measures 28.0 mmHg. Aortic valve Vmax measures 3.60 m/s.   7. Aortic root/ascending aorta has been repaired/replaced.      12/06/2020: LHC/PCI RPDA lesion is 80% stenosed. Prox RCA to Mid RCA lesion is 80% stenosed. 1st Diag lesion is 75% stenosed. Prox Cx to Mid Cx lesion  is 90% stenosed. Prox Cx lesion is 50% stenosed. A drug-eluting stent was successfully placed using a STENT RESOLUTE ONYX 3.0X18. Post intervention, there is a 0% residual stenosis. A drug-eluting stent was successfully placed using a STENT RESOLUTE ONYX 3.0X18. Post intervention, there is a 0% residual stenosis. Post intervention, there is a 0% residual stenosis.   IMPRESSION: Successful PCI and drug-eluting stenting of a mid dominant RCA as well as mid AV groove circumflex in the setting of unstable angina.  He is on aspirin Plavix currently.  He is on Coumadin for his valve and therefore will need "triple therapy" for 1 month after which aspirin can be discontinued.  He will need Plavix for at least 1 year uninterrupted.  The sheath  was sewn securely in place.  The patient left lab in stable condition.  Once ACT falls below 170 the sheath will be removed and pressure held in the Cath Lab.   Assessment & Plan    1. PPM     Known A lead noise, measurements are stable     s/p pacemaker generator change yesterday     Site is stable       2. CAD     PCI RCA and mid AV groove cx 12/07/20     On Plavix > hold post gen change 5 days     No symptoms     Unclear allergies: metoprolol (kidney failure) and myalgias with statin, fatigue with praluent     May benefit from lipid clinic referral, this can be addressed out pt        3. Hx of replacement of the valve and root with a 21 mm St Jude Aortic valve conduit     (Mechanical)     Mod-severe AVR stenosis     Warfarin     ok to resume warfarin, avoid rapid rise in INR     NO HEPARIN NO LOVENOX      4. AFib (presumed paroxysmal)     CHA2DS2Vasc is 4,  on warfarin     No tru AF noted     warfarin as above   5. HTN     Looks good  6. AMS Appreciate neurology this AM Recommends psych evaluation and will as IM to the case to help as well Ideally IM service would take on as attending given this is his acute issue currently I have spoken  to Dr. Katrinka Blazing, he will consult on the case though leaves decision to take pt on as attending service to medicine rounding team tomorrow, He has consulted psych to the case . Appreciate the assistance Social services on board as well, I suspect he will need SNF going forward  Addend; Medicine raised question of QT on his last EKG V paced rhythm with a very broad QRS of , correcting for this his QTc is   See previous note

## 2022-03-22 NOTE — Progress Notes (Addendum)
Progress Note  Patient Name: Andre Malone Date of Encounter: 03/22/2022  [facility] Cardiologist: Lauree Chandler, MD    Patient Profile     71 y.o. male  with history of CAD (RCA stent 2006, NSTEMI 2014 PDA lesion 2014, too small for intervention >> PCI to RCA/mid AV groove Cx 12/2020), HTN, HLD, DM, VHD w/severe AS and ascending aortic aneurysm (replacement of the valve and root with a 21 mm St Jude Aortic valve conduit by Dr. Cyndia Bent on October 4th 2010 >> pericardial effusion >> window), CHB w/PPM, AFib  He was admitted 7/17 for PPM generator change with possible A lead extraction//revision. No lead revision was done, A lead is functioning as programmed and given what appears to be quickly advancing dementia (?) and escalating AMS, lead revision (general anesthesia) procedure not pursued -- generator replaced   Device information SJM dual chamber PPM implanted 05/17/2009,  New RV lead and gen change 09/17/2013. He has an abandoned RV lead, (2088) from 2010 Generator change 03/20/22 He is pacer dependent  Now with hospital delirium  Hospitalist and neuro involved   Seroquel added to Wellbutrin sertraline and trazodone   Subjective   Sleeping comfortably as we enter the room, wakes easily and is cooperative  Events overnight noted, d/w RN  Inpatient Medications    Scheduled Meds:  sodium chloride   Intravenous Once   acetaminophen  500 mg Oral Q6H   buPROPion  150 mg Oral Daily   furosemide  40 mg Oral Daily   levothyroxine  112 mcg Oral Q0600   multivitamin with minerals  1 tablet Oral Daily   QUEtiapine  25 mg Oral BID   senna-docusate  1 tablet Oral Daily   sertraline  100 mg Oral Daily   traZODone  100 mg Oral QHS   Warfarin - Physician Dosing Inpatient   Does not apply q1600   Continuous Infusions:  PRN Meds: acetaminophen, lactase, LORazepam, ondansetron (ZOFRAN) IV, oxyCODONE, polyethylene glycol   Vital Signs    Vitals:   03/21/22 1606  03/21/22 2012 03/22/22 0059 03/22/22 0720  BP: (!) 116/59 (!) 146/89 (!) 98/58 135/61  Pulse:  69 (!) 57 60  Resp: 18 18 16 16   Temp: 98.6 F (37 C) 97.7 F (36.5 C) 97.9 F (36.6 C) 98.4 F (36.9 C)  TempSrc: Oral Oral Oral Oral  SpO2: 97% 95% 94% 95%  Weight:        Intake/Output Summary (Last 24 hours) at 03/22/2022 0905 Last data filed at 03/21/2022 1950 Gross per 24 hour  Intake 360 ml  Output 2 ml  Net 358 ml      03/20/2022    4:56 PM 03/17/2022    7:33 PM 02/15/2022    8:27 AM  Last 3 Weights  Weight (lbs) 196 lb 6.9 oz 209 lb 14.1 oz 209 lb 12.8 oz  Weight (kg) 89.1 kg 95.2 kg 95.165 kg      Telemetry    Pt refuses telemetry monitoring - Personally Reviewed  ECG    No new EKGs - Personally Reviewed  Physical Exam   GEN: No acute distress.   Neck: No JVD Cardiac: RRR, mechanical S2 with 2/6 M, rubs, or gallops.  Respiratory: Clear to auscultation bilaterally. GI: Soft, nontender, non-distended  MS: No edema; No deformity. Neuro:  no focal motor deficits Psych: clearer today  Pacer site is stable. Dressing is CDI, no hematoma, no bleeding noted  Labs    High Sensitivity Troponin:  No results  for input(s): "TROPONINIHS" in the last 720 hours.   Chemistry Recent Labs  Lab 03/17/22 1947  NA 137  K 4.7  CL 103  CO2 26  GLUCOSE 164*  BUN 15  CREATININE 1.91*  CALCIUM 10.0  PROT 7.5  ALBUMIN 4.5  AST 25  ALT 8  ALKPHOS 54  BILITOT 0.7  GFRNONAA 37*  ANIONGAP 8    Lipids No results for input(s): "CHOL", "TRIG", "HDL", "LABVLDL", "LDLCALC", "CHOLHDL" in the last 168 hours.  Hematology Recent Labs  Lab 03/17/22 1947  WBC 6.2  RBC 4.14*  HGB 12.1*  HCT 37.3*  MCV 90.1  MCH 29.2  MCHC 32.4  RDW 16.1*  PLT 155   Thyroid  Recent Labs  Lab 03/21/22 0710  TSH 2.565    BNPNo results for input(s): "BNP", "PROBNP" in the last 168 hours.  DDimer No results for input(s): "DDIMER" in the last 168 hours.   Radiology    EEG  adult  Result Date: 03/21/2022 Charlsie Quest, MD     03/21/2022  4:41 PM Patient Name: LAMONTE HARTT MRN: [id number] Epilepsy Attending: Charlsie Quest Referring Physician/Provider: Gordy Councilman, MD Date: 03/21/2022 Duration: 21.42 mins Patient history: 71yo M with ams. EEG to evaluate for seizure. Level of alertness: Awake AEDs during EEG study: None Technical aspects: This EEG study was done with scalp electrodes positioned according to the 10-20 International system of electrode placement. Electrical activity was acquired at a sampling rate of 500Hz  and reviewed with a high frequency filter of 70Hz  and a low frequency filter of 1Hz . EEG data were recorded continuously and digitally stored. Description: The posterior dominant rhythm consists of 7 Hz activity of moderate voltage (25-35 uV) seen predominantly in posterior head regions, symmetric and reactive to eye opening and eye closing. EEG showed continuous generalized 3 to 6 Hz theta-delta slowing. Hyperventilation and photic stimulation were not performed.   ABNORMALITY - Continuous slow, generalized - Background slow IMPRESSION: This study is suggestive of moderate diffuse encephalopathy, nonspecific etiology. No seizures or epileptiform discharges were seen throughout the recording.   EP PPM/ICD IMPLANT  Result Date: 03/20/2022 Conclusion: Successful removal of a previously implanted dual-chamber pacemaker which had reached elective replacement, and insertion of a new dual-chamber and a patient with complete heart block and no ventricular escape. , MD    Cardiac Studies   12/07/2020; TTE IMPRESSIONS   1. There is prominent apex-to base and septal-lateral left ventricular  dyssynchrony due to RV apical pacing. Left ventricular ejection fraction,  by estimation, is 60 to 65%. The left ventricle has normal function. The  left ventricle has no regional wall   motion abnormalities. There is moderate concentric  left ventricular  hypertrophy. Left ventricular diastolic parameters are consistent with  Grade II diastolic dysfunction (pseudonormalization). Elevated left atrial  pressure.   2. Right ventricular systolic function is normal. The right ventricular  size is mildly enlarged.   3. Left atrial size was severely dilated.   4. Right atrial size was mildly dilated.   5. The mitral valve is normal in structure. No evidence of mitral valve  regurgitation.   6. The aortic valve has been repaired/replaced. Aortic valve  regurgitation is not visualized. Moderate to severe aortic valve stenosis.  There is a 21 mm St. Jude valve present in the aortic position. Procedure  Date: 2010. Aortic valve mean gradient  measures 28.0 mmHg. Aortic valve Vmax measures 3.60 m/s.   7. Aortic root/ascending  aorta has been repaired/replaced.      12/06/2020: LHC/PCI RPDA lesion is 80% stenosed. Prox RCA to Mid RCA lesion is 80% stenosed. 1st Diag lesion is 75% stenosed. Prox Cx to Mid Cx lesion is 90% stenosed. Prox Cx lesion is 50% stenosed. A drug-eluting stent was successfully placed using a STENT RESOLUTE ONYX 3.0X18. Post intervention, there is a 0% residual stenosis. A drug-eluting stent was successfully placed using a STENT RESOLUTE ONYX 3.0X18. Post intervention, there is a 0% residual stenosis. Post intervention, there is a 0% residual stenosis.   IMPRESSION: Successful PCI and drug-eluting stenting of a mid dominant RCA as well as mid AV groove circumflex in the setting of unstable angina.  He is on aspirin Plavix currently.  He is on Coumadin for his valve and therefore will need "triple therapy" for 1 month after which aspirin can be discontinued.  He will need Plavix for at least 1 year uninterrupted.  The sheath was sewn securely in place.  The patient left lab in stable condition.  Once ACT falls below 170 the sheath will be removed and pressure held in the Cath Lab.  Patient Profile     71 y.o.  male  with history of CAD (RCA stent 2006, NSTEMI 2014 PDA lesion 2014, too small for intervention >> PCI to RCA/mid AV groove Cx 12/2020), HTN, HLD, DM, VHD w/severe AS and ascending aortic aneurysm (replacement of the valve and root with a 21 mm St Jude Aortic valve conduit by Dr. Laneta Simmers on October 4th 2010 >> pericardial effusion >> window), CHB w/PPM, AFib  He was admitted yesterday for PPM generator change with possible A lead extraction//revision. No lad revision was done, A lead is functioning as programmed and given what appears to be quickly advancing dementia (?) and escalating AMS, lead revision (general anesthesia) procedure not pursued    Device information SJM dual chamber PPM implanted 05/17/2009,  New RV lead and gen change 09/17/2013. He has an abandoned RV lead, (2088) from 2010 Generator change 03/20/22 He is pacer dependent  Assessment & Plan    1. PPM     Known A lead noise, measurements have been stable     s/p pacemaker generator change 03/20/22     Dressing is CDI Plan to remove tegaderm once d/c timing is more clear       2. CAD     PCI RCA and mid AV groove cx 12/07/20     On Plavix > hold post gen change 5 days     No symptoms     Unclear allergies: metoprolol (kidney failure) and myalgias with statin, fatigue with praluent     May benefit from lipid clinic referral, this can be addressed out pt        3. Hx of replacement of the valve and root with a 21 mm St Jude Aortic valve conduit     (Mechanical)     Mod-severe AVR stenosis     Warfarin, resumed     NO HEPARIN NO LOVENOX  INR lower today, dosing with pharmacy, got a higher then home dose yesterday, hopefully will uptrend soon, but try and avoid sharp rise with fresh pacer pocket      4. AFib, paroxysmal     CHA2DS2Vasc is 4,  on warfarin     No tru AF noted     warfarin as above   5. HTN     Looks good  6. AMS Appreciate neurology,psych, and IM this AM  He is more alert this AM, eating  breakfast Doesn't like the food Aware he is in the hospital, could not recall why, slept poorly, says that they kept turning on his TV and was loud.  He can NOT have an MRI (has a known old/abandoned pacing lead)  OK from EP to discharge once facility and ready otherwise. Would appreciate if IM team could take over as attending.  Await their rounding MD to see today    For questions or updates, please contact [place] Please consult [email or web address] for contact info under        Signed, Baldwin Jamaica, PA-C  03/22/2022, 9:05 AM   (As above) The issue will be disposition and clearing of his delirium.  Currently better today compared to the records and better today according to RU-PA  Ambulate with assistance Continue warfarin

## 2022-03-22 NOTE — Progress Notes (Signed)
[facility]  PROGRESS NOTE  Andre Malone GQQ:[id number] DOB: 1951-04-26 DOA: 03/20/2022 PCP: Bennie Pierini, FNP   Brief HPI:   71 year old male with past medical history of hypertension, hyperlipidemia, PAF on chronic anticoagulation, CAD s/p PCI, aortic stenosis s/p AVR, CHB s/p PPM in 2010 who was initially admitted for generator replacement and possible lead extraction/revision.  They were able to exchange the battery but lead extraction/W was not pursued.  Overnight patient became confused and combative.  Work-up for altered mental status including CT of the brain, thyroid testing, RPR, ammonia level were normal.  Neurology felt symptoms and dementia.  Psych was consulted. MRI brain ordered however could not be obtained as patient has pacemaker in place.  Psychiatry saw the patient and recommended low-dose Seroquel 25 mg twice daily.   Subjective   Patient seen and examined, alert this morning.  States that he did not sleep well.  He knows that he is in the hospital.   Assessment/Plan:    Delirium on underlying dementia -Patient has waxing and waning mental status/behavior -Slowly improving -EEG was unremarkable for seizure, showed continuous generalized slowing -Reversible causes has been negative; ammonia 23, TSH 2.565, folate 12.1, B12 566, HIV nonreactive,, RPR nonreactive -Thiamine level obtained, result is pending; will start empirically on thiamine 250 mg p.o. daily -Neurology and psychiatry has signed off -Patient started on Seroquel 25 mg twice daily -Continue bupropion, sertraline, trazodone -MRI could not be obtained, as patient has known/old dependent pacing lead  S/p pacemaker generator change -Cardiology following  Chronic kidney disease stage IIIb -Creatinine stable at 1.91  CAD -History of PCI to RCA on 12/07/2020 -Patient currently on Plavix which is on hold due to generator change for 5 days  Mechanical aortic valve -Continue  warfarin -Cardiology does not want to increase the INR quickly as patient has a fresh pacer pocket  Paroxysmal atrial fibrillation -CHA2DS2-VASc score is 4 -Continue warfarin  Hypertension -Blood pressure is stable -Continue furosemide  Disposition -Plan to discharge to memory care facility -TOC following   Medications     sodium chloride   Intravenous Once   acetaminophen  500 mg Oral Q6H   buPROPion  150 mg Oral Daily   furosemide  40 mg Oral Daily   levothyroxine  112 mcg Oral Q0600   multivitamin with minerals  1 tablet Oral Daily   QUEtiapine  25 mg Oral BID   senna-docusate  1 tablet Oral Daily   sertraline  100 mg Oral Daily   traZODone  100 mg Oral QHS   warfarin  5 mg Oral ONCE-1600   Warfarin - Physician Dosing Inpatient   Does not apply q1600     Data Reviewed:   CBG:  Recent Labs  Lab 03/20/22 1324  GLUCAP 143*    SpO2: 95 %    Vitals:   03/21/22 1606 03/21/22 2012 03/22/22 0059 03/22/22 0720  BP: (!) 116/59 (!) 146/89 (!) 98/58 135/61  Pulse:  69 (!) 57 60  Resp: 18 18 16 16   Temp: 98.6 F (37 C) 97.7 F (36.5 C) 97.9 F (36.6 C) 98.4 F (36.9 C)  TempSrc: Oral Oral Oral Oral  SpO2: 97% 95% 94% 95%  Weight:          Data Reviewed:  Basic Metabolic Panel: Recent Labs  Lab 03/17/22 1947  NA 137  K 4.7  CL 103  CO2 26  GLUCOSE 164*  BUN 15  CREATININE 1.91*  CALCIUM 10.0    CBC: Recent  Labs  Lab 03/17/22 1947  WBC 6.2  HGB 12.1*  HCT 37.3*  MCV 90.1  PLT 155    LFT Recent Labs  Lab 03/17/22 1947  AST 25  ALT 8  ALKPHOS 54  BILITOT 0.7  PROT 7.5  ALBUMIN 4.5     Antibiotics: Anti-infectives (From admission, onward)    Start     Dose/Rate Route Frequency Ordered Stop   03/20/22 2200  ceFAZolin (ANCEF) IVPB 1 g/50 mL premix        1 g 100 mL/hr over 30 Minutes Intravenous  Once 03/20/22 1619 03/20/22 2241   03/20/22 1330  gentamicin (GARAMYCIN) 80 mg in sodium chloride 0.9 % 500 mL irrigation         80 mg Irrigation On call 03/20/22 1324 03/20/22 1526   03/20/22 1330  ceFAZolin (ANCEF) IVPB 2g/100 mL premix        2 g 200 mL/hr over 30 Minutes Intravenous On call 03/20/22 1324 03/20/22 1615        DVT prophylaxis: Warfarin  Code Status: Full code  Family Communication: No family at bedside   CONSULTS cardiology   Objective    Physical Examination:   General-appears in no acute distress Heart-S1-S2, regular, no murmur auscultated Lungs-clear to auscultation bilaterally, no wheezing or crackles auscultated Abdomen-soft, nontender, no organomegaly Extremities-no edema in the lower extremities Neuro-alert, oriented x3, no focal deficit noted  Status is: Inpatient: Altered mental status     Andre Malone   [facility] If 7PM-7AM, please contact night-coverage at [email or web address], Office  [phone]   03/22/2022, 2:19 PM  LOS: 2 days

## 2022-03-22 NOTE — Progress Notes (Signed)
Neurology Progress Note  Patient ID: Andre Malone is a 71 y.o. 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  Subjective: No acute complaints, remains slightly confused/paranoid, for example stating that he does not know why he is here because he was tricked into coming to the hospital   Exam: Vitals:   03/22/22 0059 03/22/22 0720  BP: (!) 98/58 135/61  Pulse: (!) 57 60  Resp: 16 16  Temp: 97.9 F (36.6 C) 98.4 F (36.9 C)  SpO2: 94% 95%   Gen: In bed, comfortable  Resp: non-labored breathing, no grossly audible wheezing Cardiac: Perfusing extremities well  Abd: soft, nt  Neuro: MS: Awake, alert, oriented to [name], year, month and date, able to follow simple commands, not oriented to situation CN: Pupils equal round reactive to light, EOMI, Bell's palsy of the left face, chronic with compensatory synkinesis, relative right nasolabial fold flattening at rest and widened palpebral fissure on the right Motor: No pronator drift  Pertinent Labs:  Basic Metabolic Panel: Recent Labs  Lab 03/17/22 1947  NA 137  K 4.7  CL 103  CO2 26  GLUCOSE 164*  BUN 15  CREATININE 1.91*  CALCIUM 10.0    CBC: Recent Labs  Lab 03/17/22 1947  WBC 6.2  HGB 12.1*  HCT 37.3*  MCV 90.1  PLT 155    Coagulation Studies: Recent Labs    03/20/22 1325  LABPROT 15.3*  INR 1.2    Lab Results  Component Value Date   FOLATE 12.1 03/21/2022    Lab Results   Component Value Date   VITAMINB12 566 03/21/2022   Lab Results  Component Value Date   TSH 2.565 03/21/2022   T4TOTAL 7.4 01/18/2022   Ammonia 23  HIV negative  RPR negative  Thiamine in process  Head CT 7/14 personally reviewed, agree with radiology: 1. No acute intracranial pathology. 2. Mild age-related atrophy and chronic microvascular ischemic changes.  EEG:  - Continuous slow, generalized - Background slow  Impression: Waxing and waning mental status and behavior as described concerning for delirium on dementia.  Thus far reversible causes work-up is negative, agree the patient is unsafe to continue living independently  Recommendations: -thiamine in process, treat deficiency if needed -Appreciate psychiatry comanagement of agitated delirium  -Appreciate social work assistance with transition of care -Neurology will sign off at this time, please reach out if any questions or concerns arise  Brooke Dare MD-PhD [facility] [phone]

## 2022-03-23 DIAGNOSIS — F1027 Alcohol dependence with alcohol-induced persisting dementia: Secondary | ICD-10-CM | POA: Diagnosis not present

## 2022-03-23 DIAGNOSIS — I2510 Atherosclerotic heart disease of native coronary artery without angina pectoris: Secondary | ICD-10-CM | POA: Diagnosis not present

## 2022-03-23 LAB — BASIC METABOLIC PANEL
Anion gap: 13 (ref 5–15)
BUN: 29 mg/dL — ABNORMAL HIGH (ref 8–23)
CO2: 25 mmol/L (ref 22–32)
Calcium: 8.6 mg/dL — ABNORMAL LOW (ref 8.9–10.3)
Chloride: 99 mmol/L (ref 98–111)
Creatinine, Ser: 2.03 mg/dL — ABNORMAL HIGH (ref 0.61–1.24)
GFR, Estimated: 35 mL/min — ABNORMAL LOW (ref >60)
Glucose, Bld: 160 mg/dL — ABNORMAL HIGH (ref 70–99)
Potassium: 3.3 mmol/L — ABNORMAL LOW (ref 3.5–5.1)
Sodium: 137 mmol/L (ref 135–145)

## 2022-03-23 LAB — TYPE AND SCREEN
ABO/RH(D): A POS
Antibody Screen: NEGATIVE
Unit division: 0
Unit division: 0

## 2022-03-23 LAB — BPAM RBC
Blood Product Expiration Date: 202307282359
Blood Product Expiration Date: 202307292359
ISSUE DATE / TIME: 202307130706
Unit Type and Rh: 6200
Unit Type and Rh: 6200

## 2022-03-23 LAB — PROTIME-INR
INR: 1.6 — ABNORMAL HIGH (ref 0.8–1.2)
Prothrombin Time: 18.5 seconds — ABNORMAL HIGH (ref 11.4–15.2)

## 2022-03-23 LAB — VITAMIN B1: Vitamin B1 (Thiamine): 91.8 nmol/L (ref 66.5–200.0)

## 2022-03-23 MED ORDER — ALUM & MAG HYDROXIDE-SIMETH 200-200-20 MG/5ML PO SUSP
30.0000 mL | Freq: Four times a day (QID) | ORAL | Status: DC | PRN
Start: 2022-03-23 — End: 2022-03-24
  Administered 2022-03-23 – 2022-03-24 (×2): 30 mL via ORAL
  Filled 2022-03-23 (×2): qty 30

## 2022-03-23 MED ORDER — WARFARIN SODIUM 5 MG PO TABS
5.0000 mg | ORAL_TABLET | Freq: Once | ORAL | Status: AC
  Administered 2022-03-23: 5 mg via ORAL
  Filled 2022-03-23: qty 1

## 2022-03-23 MED ORDER — ORAL CARE MOUTH RINSE: 15.0000 mL | OROMUCOSAL | Status: DC | PRN

## 2022-03-23 MED ORDER — WARFARIN - PHARMACIST DOSING INPATIENT: Freq: Every day | Status: DC

## 2022-03-23 NOTE — Care Management Important Message (Signed)
Important Message  Patient Details  Name: Andre Malone MRN: [id number] Date of Birth: [birth year]   Medicare Important Message Given:  Yes     Dorena Bodo 03/23/2022, 2:40 PM
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
## 2022-03-23 NOTE — Plan of Care (Signed)

## 2022-03-23 NOTE — Progress Notes (Signed)
[facility]  PROGRESS NOTE  Andre Malone JOA:[id number] DOB: Apr 13, 1951 DOA: 03/20/2022 PCP: Bennie Pierini, FNP   Brief HPI:    71 year old male with past medical history of hypertension, hyperlipidemia, PAF on chronic anticoagulation, CAD s/p PCI, aortic stenosis s/p AVR, CHB s/p PPM in 2010 who was initially admitted for generator replacement and possible lead extraction/revision.  They were able to exchange the battery but lead extraction/W was not pursued.  Overnight patient became confused and combative.  Work-up for altered mental status including CT of the brain, thyroid testing, RPR, ammonia level were normal.  Neurology felt symptoms and dementia.  Psych was consulted. MRI brain ordered however could not be obtained as patient has pacemaker in place.  Psychiatry saw the patient and recommended low-dose Seroquel 25 mg twice daily.   Subjective   Patient seen and examined, denies any complaints.  States he did not sleep well last night.   Assessment/Plan:    Delirium on underlying dementia -Patient has waxing and waning mental status/behavior -Slowly improving -EEG was unremarkable for seizure, showed continuous generalized slowing -Reversible causes has been negative; ammonia 23, TSH 2.565, folate 12.1, B12 566, HIV nonreactive,, RPR nonreactive -Thiamine level obtained, result is pending; started empirically on thiamine 250 mg p.o. daily -Neurology and psychiatry has signed off -Patient started on Seroquel 25 mg twice daily -Patient is on bupropion, sertraline, trazodone; will discontinue bupropion as it can be very activating medication, making it difficult to sleep at night -MRI could not be obtained, as patient has known/old dependent pacing lead  S/p pacemaker generator change -Cardiology following  Chronic kidney disease stage IIIb -Creatinine stable at 1.91 -Repeat BMP today  Hypothyroidism -Continue Synthroid  CAD -History of PCI to RCA on  12/07/2020 -Patient currently on Plavix which is on hold due to generator change for 5 days  Mechanical aortic valve -Continue warfarin -Cardiology does not want to increase the INR quickly as patient has a fresh pacer pocket  Paroxysmal atrial fibrillation -CHA2DS2-VASc score is 4 -Continue warfarin  Hypertension -Blood pressure is stable -Continue furosemide  Disposition -Plan to discharge to memory care facility -TOC following   Medications     sodium chloride   Intravenous Once   acetaminophen  500 mg Oral Q6H   buPROPion  150 mg Oral Daily   furosemide  40 mg Oral Daily   levothyroxine  112 mcg Oral Q0600   multivitamin with minerals  1 tablet Oral Daily   QUEtiapine  25 mg Oral BID   senna-docusate  1 tablet Oral Daily   sertraline  100 mg Oral Daily   thiamine  250 mg Oral Daily   traZODone  100 mg Oral QHS   warfarin  5 mg Oral ONCE-1600   Warfarin - Pharmacist Dosing Inpatient   Does not apply q1600     Data Reviewed:   CBG:  Recent Labs  Lab 03/20/22 1324  GLUCAP 143*    SpO2: 90 %    Vitals:   03/22/22 1923 03/23/22 0144 03/23/22 0330 03/23/22 0829  BP: 125/63  (!) 138/57 (!) 146/97  Pulse: 61  60 (!) 59  Resp: 16   20  Temp: 98.3 F (36.8 C)  97.7 F (36.5 C) 97.8 F (36.6 C)  TempSrc: Oral  Oral Oral  SpO2: 97%  93% 90%  Weight:      Height:  5\' 6"  (1.676 m)        Data Reviewed:  Basic Metabolic Panel: Recent Labs  Lab  03/17/22 1947  NA 137  K 4.7  CL 103  CO2 26  GLUCOSE 164*  BUN 15  CREATININE 1.91*  CALCIUM 10.0    CBC: Recent Labs  Lab 03/17/22 1947  WBC 6.2  HGB 12.1*  HCT 37.3*  MCV 90.1  PLT 155    LFT Recent Labs  Lab 03/17/22 1947  AST 25  ALT 8  ALKPHOS 54  BILITOT 0.7  PROT 7.5  ALBUMIN 4.5     Antibiotics: Anti-infectives (From admission, onward)    Start     Dose/Rate Route Frequency Ordered Stop   03/20/22 2200  ceFAZolin (ANCEF) IVPB 1 g/50 mL premix        1 g 100 mL/hr over  30 Minutes Intravenous  Once 03/20/22 1619 03/20/22 2241   03/20/22 1330  gentamicin (GARAMYCIN) 80 mg in sodium chloride 0.9 % 500 mL irrigation        80 mg Irrigation On call 03/20/22 1324 03/20/22 1526   03/20/22 1330  ceFAZolin (ANCEF) IVPB 2g/100 mL premix        2 g 200 mL/hr over 30 Minutes Intravenous On call 03/20/22 1324 03/20/22 1615        DVT prophylaxis: Warfarin  Code Status: Full code  Family Communication: No family at bedside   CONSULTS cardiology   Objective    Physical Examination:  General-appears in no acute distress Heart-S1-S2, regular, no murmur auscultated Lungs-clear to auscultation bilaterally, no wheezing or crackles auscultated Abdomen-soft, nontender, no organomegaly Extremities-no edema in the lower extremities Neuro-alert, oriented x3, no focal deficit noted   Status is: Inpatient: Altered mental status     Javonne Louissaint S Sadler Teschner   [facility] If 7PM-7AM, please contact night-coverage at [email or web address], Office  [phone]   03/23/2022, 11:40 AM  LOS: 3 days

## 2022-03-24 DIAGNOSIS — G2581 Restless legs syndrome: Secondary | ICD-10-CM

## 2022-03-24 DIAGNOSIS — F1027 Alcohol dependence with alcohol-induced persisting dementia: Secondary | ICD-10-CM | POA: Diagnosis not present

## 2022-03-24 DIAGNOSIS — R69 Illness, unspecified: Secondary | ICD-10-CM | POA: Diagnosis not present

## 2022-03-24 DIAGNOSIS — I2510 Atherosclerotic heart disease of native coronary artery without angina pectoris: Secondary | ICD-10-CM | POA: Diagnosis not present

## 2022-03-24 DIAGNOSIS — N1832 Chronic kidney disease, stage 3b: Secondary | ICD-10-CM

## 2022-03-24 DIAGNOSIS — I10 Essential (primary) hypertension: Secondary | ICD-10-CM

## 2022-03-24 LAB — CBC
HCT: 37.5 % — ABNORMAL LOW (ref 39.0–52.0)
Hemoglobin: 12.3 g/dL — ABNORMAL LOW (ref 13.0–17.0)
MCH: 29.1 pg (ref 26.0–34.0)
MCHC: 32.8 g/dL (ref 30.0–36.0)
MCV: 88.9 fL (ref 80.0–100.0)
Platelets: 144 10*3/uL — ABNORMAL LOW (ref 150–400)
RBC: 4.22 MIL/uL (ref 4.22–5.81)
RDW: 15.1 % (ref 11.5–15.5)
WBC: 6.6 10*3/uL (ref 4.0–10.5)
nRBC: 0 % (ref 0.0–0.2)

## 2022-03-24 LAB — PROTIME-INR
INR: 1.8 — ABNORMAL HIGH (ref 0.8–1.2)
Prothrombin Time: 21.1 seconds — ABNORMAL HIGH (ref 11.4–15.2)

## 2022-03-24 MED ORDER — POTASSIUM CHLORIDE CRYS ER 20 MEQ PO TBCR
20.0000 meq | EXTENDED_RELEASE_TABLET | Freq: Once | ORAL | Status: AC
  Administered 2022-03-24: 20 meq via ORAL
  Filled 2022-03-24: qty 1

## 2022-03-24 MED ORDER — CLOPIDOGREL BISULFATE 75 MG PO TABS: 75.0000 mg | ORAL_TABLET | Freq: Every day | ORAL | 3 refills | Status: AC

## 2022-03-24 MED ORDER — WARFARIN SODIUM 5 MG PO TABS: 5.0000 mg | ORAL_TABLET | Freq: Once | ORAL | Status: DC

## 2022-03-24 MED ORDER — FUROSEMIDE 20 MG PO TABS: 20.0000 mg | ORAL_TABLET | Freq: Every day | ORAL | 3 refills | Status: AC

## 2022-03-24 MED ORDER — AMLODIPINE BESYLATE 5 MG PO TABS
5.0000 mg | ORAL_TABLET | Freq: Every day | ORAL | Status: DC
  Administered 2022-03-24: 5 mg via ORAL
  Filled 2022-03-24: qty 1

## 2022-03-24 MED ORDER — CLOPIDOGREL BISULFATE 75 MG PO TABS: 75.0000 mg | ORAL_TABLET | Freq: Every day | ORAL | 3 refills | Status: DC

## 2022-03-24 MED ORDER — GABAPENTIN 100 MG PO CAPS: 100.0000 mg | ORAL_CAPSULE | Freq: Every day | ORAL | Status: DC
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
## 2022-03-24 NOTE — Plan of Care (Signed)

## 2022-03-24 NOTE — Progress Notes (Signed)
Progress Note  Patient Name: Andre Malone Date of Encounter: 03/24/2022  [facility] Cardiologist: Lauree Chandler, MD    Patient Profile     71 y.o. male  with history of CAD (RCA stent 2006, NSTEMI 2014 PDA lesion 2014, too small for intervention >> PCI to RCA/mid AV groove Cx 12/2020), HTN, HLD, DM, VHD w/severe AS and ascending aortic aneurysm (replacement of the valve and root with a 21 mm St Jude Aortic valve conduit by Dr. Cyndia Bent on October 4th 2010 >> pericardial effusion >> window), CHB w/PPM, AFib  He was admitted 7/17 for PPM generator change with possible A lead extraction//revision. No lead revision was done, A lead is functioning as programmed and given what appears to be quickly advancing dementia (?) and escalating AMS, lead revision (general anesthesia) procedure not pursued -- generator replaced   Device information SJM dual chamber PPM implanted 05/17/2009,  New RV lead and gen change 09/17/2013. He has an abandoned RV lead, (2088) from 2010 Generator change 03/20/22 He is pacer dependent  Now with hospital delirium  Hospitalist and neuro involved   Seroquel added to Wellbutrin sertraline and trazodone   Subjective   Sleeping Per sitter had just gone to sleep so did not arouse to talk or to exam  Inpatient Medications    Scheduled Meds:  sodium chloride   Intravenous Once   acetaminophen  500 mg Oral Q6H   amLODipine  5 mg Oral Daily   gabapentin  100 mg Oral QHS   levothyroxine  112 mcg Oral Q0600   multivitamin with minerals  1 tablet Oral Daily   QUEtiapine  25 mg Oral BID   senna-docusate  1 tablet Oral Daily   sertraline  100 mg Oral Daily   thiamine  250 mg Oral Daily   traZODone  100 mg Oral QHS   Warfarin - Pharmacist Dosing Inpatient   Does not apply q1600   Continuous Infusions:  PRN Meds: acetaminophen, alum & mag hydroxide-simeth, lactase, LORazepam, ondansetron (ZOFRAN) IV, mouth rinse, oxyCODONE, polyethylene glycol    Vital Signs    Vitals:   03/23/22 1207 03/23/22 1926 03/23/22 2306 03/24/22 0814  BP: (!) 149/63 (!) 146/73 (!) 149/109 (!) 148/79  Pulse: 60 60 72   Resp: 20 18 20    Temp: 98.1 F (36.7 C) (!) 97.3 F (36.3 C) 98 F (36.7 C) 98.4 F (36.9 C)  TempSrc: Oral Oral Oral Oral  SpO2: 91% 95% 98% 96%  Weight:      Height:        Intake/Output Summary (Last 24 hours) at 03/24/2022 0940 Last data filed at 03/24/2022 0733 Gross per 24 hour  Intake 720 ml  Output --  Net 720 ml       03/20/2022    4:56 PM 03/17/2022    7:33 PM 02/15/2022    8:27 AM  Last 3 Weights  Weight (lbs) 196 lb 6.9 oz 209 lb 14.1 oz 209 lb 12.8 oz  Weight (kg) 89.1 kg 95.2 kg 95.165 kg      Telemetry    Pt refuses telemetry monitoring - Personally Reviewed  ECG    No new EKGs - Personally Reviewed  Physical Exam      Labs    High Sensitivity Troponin:  No results for input(s): "TROPONINIHS" in the last 720 hours.   Chemistry Recent Labs  Lab 03/17/22 1947 03/23/22 0110  NA 137 137  K 4.7 3.3*  CL 103 99  CO2 26 25  GLUCOSE 164* 160*  BUN 15 29*  CREATININE 1.91* 2.03*  CALCIUM 10.0 8.6*  PROT 7.5  --   ALBUMIN 4.5  --   AST 25  --   ALT 8  --   ALKPHOS 54  --   BILITOT 0.7  --   GFRNONAA 37* 35*  ANIONGAP 8 13     Lipids No results for input(s): "CHOL", "TRIG", "HDL", "LABVLDL", "LDLCALC", "CHOLHDL" in the last 168 hours.  Hematology Recent Labs  Lab 03/17/22 1947 03/24/22 0116  WBC 6.2 6.6  RBC 4.14* 4.22  HGB 12.1* 12.3*  HCT 37.3* 37.5*  MCV 90.1 88.9  MCH 29.2 29.1  MCHC 32.4 32.8  RDW 16.1* 15.1  PLT 155 144*    Thyroid  Recent Labs  Lab 03/21/22 0710  TSH 2.565     BNPNo results for input(s): "BNP", "PROBNP" in the last 168 hours.  DDimer No results for input(s): "DDIMER" in the last 168 hours.   Radiology    No results found.  Cardiac Studies   12/07/2020; TTE IMPRESSIONS   1. There is prominent apex-to base and septal-lateral left  ventricular  dyssynchrony due to RV apical pacing. Left ventricular ejection fraction,  by estimation, is 60 to 65%. The left ventricle has normal function. The  left ventricle has no regional wall   motion abnormalities. There is moderate concentric left ventricular  hypertrophy. Left ventricular diastolic parameters are consistent with  Grade II diastolic dysfunction (pseudonormalization). Elevated left atrial  pressure.   2. Right ventricular systolic function is normal. The right ventricular  size is mildly enlarged.   3. Left atrial size was severely dilated.   4. Right atrial size was mildly dilated.   5. The mitral valve is normal in structure. No evidence of mitral valve  regurgitation.   6. The aortic valve has been repaired/replaced. Aortic valve  regurgitation is not visualized. Moderate to severe aortic valve stenosis.  There is a 21 mm St. Jude valve present in the aortic position. Procedure  Date: 2010. Aortic valve mean gradient  measures 28.0 mmHg. Aortic valve Vmax measures 3.60 m/s.   7. Aortic root/ascending aorta has been repaired/replaced.      12/06/2020: LHC/PCI RPDA lesion is 80% stenosed. Prox RCA to Mid RCA lesion is 80% stenosed. 1st Diag lesion is 75% stenosed. Prox Cx to Mid Cx lesion is 90% stenosed. Prox Cx lesion is 50% stenosed. A drug-eluting stent was successfully placed using a STENT RESOLUTE ONYX 3.0X18. Post intervention, there is a 0% residual stenosis. A drug-eluting stent was successfully placed using a STENT RESOLUTE ONYX 3.0X18. Post intervention, there is a 0% residual stenosis. Post intervention, there is a 0% residual stenosis.   IMPRESSION: Successful PCI and drug-eluting stenting of a mid dominant RCA as well as mid AV groove circumflex in the setting of unstable angina.  He is on aspirin Plavix currently.  He is on Coumadin for his valve and therefore will need "triple therapy" for 1 month after which aspirin can be discontinued.  He  will need Plavix for at least 1 year uninterrupted.  The sheath was sewn securely in place.  The patient left lab in stable condition.  Once ACT falls below 170 the sheath will be removed and pressure held in the Cath Lab.  Patient Profile     71 y.o. male  with history of CAD (RCA stent 2006, NSTEMI 2014 PDA lesion 2014, too small for intervention >> PCI to RCA/mid AV groove Cx 12/2020),  HTN, HLD, DM, VHD w/severe AS and ascending aortic aneurysm (replacement of the valve and root with a 21 mm St Jude Aortic valve conduit by Dr. Laneta Simmers on October 4th 2010 >> pericardial effusion >> window), CHB w/PPM, AFib  He was admitted yesterday for PPM generator change with possible A lead extraction//revision. No lad revision was done, A lead is functioning as programmed and given what appears to be quickly advancing dementia (?) and escalating AMS, lead revision (general anesthesia) procedure not pursued    Device information SJM dual chamber PPM implanted 05/17/2009,  New RV lead and gen change 09/17/2013. He has an abandoned RV lead, (2088) from 2010 Generator change 03/20/22 He is pacer dependent  Assessment & Plan    PPM s/p replacement AVR-mechanical Confusion/hospital delirium  CAD prior stenting AF persistent  HTN   Overall better  less confused  Awaiting options for placement APPRECIATE care by hospitalists  Continue coumadin Ambulate as can Will remove device bandage at discharge--steristrips will need to remain in place Able to shower at discharge

## 2022-03-24 NOTE — Progress Notes (Signed)
Progress Note  Patient Name: Andre Malone Date of Encounter: 03/24/2022  [facility] Cardiologist: Verne Carrow, MD    Patient Profile     71 y.o. male  with history of CAD (RCA stent 2006, NSTEMI 2014 PDA lesion 2014, too small for intervention >> PCI to RCA/mid AV groove Cx 12/2020), HTN, HLD, DM, VHD w/severe AS and ascending aortic aneurysm (replacement of the valve and root with a 21 mm St Jude Aortic valve conduit by Dr. Laneta Simmers on October 4th 2010 >> pericardial effusion >> window), CHB w/PPM, AFib  He was admitted 7/17 for PPM generator change with possible A lead extraction//revision. No lead revision was done, A lead is functioning as programmed and given what appears to be quickly advancing dementia (?) and escalating AMS, lead revision (general anesthesia) procedure not pursued -- generator replaced   Device information SJM dual chamber PPM implanted 05/17/2009,  New RV lead and gen change 09/17/2013. He has an abandoned RV lead, (2088) from 2010 Generator change 03/20/22 He is pacer dependent  Now with hospital delirium  Hospitalist and neuro involved   Seroquel added to Wellbutrin sertraline and trazodone   Subjective   Awake  Clearer able to articulate plans for discharge Complaining of restlessness of legs and body  Inpatient Medications    Scheduled Meds:  sodium chloride   Intravenous Once   acetaminophen  500 mg Oral Q6H   amLODipine  5 mg Oral Daily   gabapentin  100 mg Oral QHS   levothyroxine  112 mcg Oral Q0600   multivitamin with minerals  1 tablet Oral Daily   QUEtiapine  25 mg Oral BID   senna-docusate  1 tablet Oral Daily   sertraline  100 mg Oral Daily   thiamine  250 mg Oral Daily   traZODone  100 mg Oral QHS   Warfarin - Pharmacist Dosing Inpatient   Does not apply q1600   Continuous Infusions:  PRN Meds: acetaminophen, alum & mag hydroxide-simeth, lactase, LORazepam, ondansetron (ZOFRAN) IV, mouth rinse, oxyCODONE,  polyethylene glycol   Vital Signs    Vitals:   03/23/22 1207 03/23/22 1926 03/23/22 2306 03/24/22 0814  BP: (!) 149/63 (!) 146/73 (!) 149/109 (!) 148/79  Pulse: 60 60 72   Resp: 20 18 20    Temp: 98.1 F (36.7 C) (!) 97.3 F (36.3 C) 98 F (36.7 C) 98.4 F (36.9 C)  TempSrc: Oral Oral Oral Oral  SpO2: 91% 95% 98% 96%  Weight:      Height:        Intake/Output Summary (Last 24 hours) at 03/24/2022 0945 Last data filed at 03/24/2022 03/25/2022 Gross per 24 hour  Intake 720 ml  Output --  Net 720 ml       03/20/2022    4:56 PM 03/17/2022    7:33 PM 02/15/2022    8:27 AM  Last 3 Weights  Weight (lbs) 196 lb 6.9 oz 209 lb 14.1 oz 209 lb 12.8 oz  Weight (kg) 89.1 kg 95.2 kg 95.165 kg      Telemetry    Pt refuses telemetry monitoring - Personally Reviewed  ECG    No new EKGs - Personally Reviewed  Physical Exam    Well developed and nourished in no acute distress HENT normal Neck supple  Pocket without hematoma, swelling or tenderness  Clear Regular rate and rhythm, mechanical S2 with 2/6 m Abd-soft with active BS No Clubbing cyanosis edema Skin-warm and dry A & Oriented  Grossly normal sensory  and motor function  restless    Labs    High Sensitivity Troponin:  No results for input(s): "TROPONINIHS" in the last 720 hours.   Chemistry Recent Labs  Lab 03/17/22 1947 03/23/22 0110  NA 137 137  K 4.7 3.3*  CL 103 99  CO2 26 25  GLUCOSE 164* 160*  BUN 15 29*  CREATININE 1.91* 2.03*  CALCIUM 10.0 8.6*  PROT 7.5  --   ALBUMIN 4.5  --   AST 25  --   ALT 8  --   ALKPHOS 54  --   BILITOT 0.7  --   GFRNONAA 37* 35*  ANIONGAP 8 13     Lipids No results for input(s): "CHOL", "TRIG", "HDL", "LABVLDL", "LDLCALC", "CHOLHDL" in the last 168 hours.  Hematology Recent Labs  Lab 03/17/22 1947 03/24/22 0116  WBC 6.2 6.6  RBC 4.14* 4.22  HGB 12.1* 12.3*  HCT 37.3* 37.5*  MCV 90.1 88.9  MCH 29.2 29.1  MCHC 32.4 32.8  RDW 16.1* 15.1  PLT 155 144*    Thyroid   Recent Labs  Lab 03/21/22 0710  TSH 2.565     BNPNo results for input(s): "BNP", "PROBNP" in the last 168 hours.  DDimer No results for input(s): "DDIMER" in the last 168 hours.   Radiology    No results found.  Cardiac Studies   12/07/2020; TTE IMPRESSIONS   1. There is prominent apex-to base and septal-lateral left ventricular  dyssynchrony due to RV apical pacing. Left ventricular ejection fraction,  by estimation, is 60 to 65%. The left ventricle has normal function. The  left ventricle has no regional wall   motion abnormalities. There is moderate concentric left ventricular  hypertrophy. Left ventricular diastolic parameters are consistent with  Grade II diastolic dysfunction (pseudonormalization). Elevated left atrial  pressure.   2. Right ventricular systolic function is normal. The right ventricular  size is mildly enlarged.   3. Left atrial size was severely dilated.   4. Right atrial size was mildly dilated.   5. The mitral valve is normal in structure. No evidence of mitral valve  regurgitation.   6. The aortic valve has been repaired/replaced. Aortic valve  regurgitation is not visualized. Moderate to severe aortic valve stenosis.  There is a 21 mm St. Jude valve present in the aortic position. Procedure  Date: 2010. Aortic valve mean gradient  measures 28.0 mmHg. Aortic valve Vmax measures 3.60 m/s.   7. Aortic root/ascending aorta has been repaired/replaced.      12/06/2020: LHC/PCI RPDA lesion is 80% stenosed. Prox RCA to Mid RCA lesion is 80% stenosed. 1st Diag lesion is 75% stenosed. Prox Cx to Mid Cx lesion is 90% stenosed. Prox Cx lesion is 50% stenosed. A drug-eluting stent was successfully placed using a STENT RESOLUTE ONYX 3.0X18. Post intervention, there is a 0% residual stenosis. A drug-eluting stent was successfully placed using a STENT RESOLUTE ONYX 3.0X18. Post intervention, there is a 0% residual stenosis. Post intervention, there is a 0%  residual stenosis.   IMPRESSION: Successful PCI and drug-eluting stenting of a mid dominant RCA as well as mid AV groove circumflex in the setting of unstable angina.  He is on aspirin Plavix currently.  He is on Coumadin for his valve and therefore will need "triple therapy" for 1 month after which aspirin can be discontinued.  He will need Plavix for at least 1 year uninterrupted.  The sheath was sewn securely in place.  The patient left lab in stable  condition.  Once ACT falls below 170 the sheath will be removed and pressure held in the Cath Lab.  Patient Profile     71 y.o. male  with history of CAD (RCA stent 2006, NSTEMI 2014 PDA lesion 2014, too small for intervention >> PCI to RCA/mid AV groove Cx 12/2020), HTN, HLD, DM, VHD w/severe AS and ascending aortic aneurysm (replacement of the valve and root with a 21 mm St Jude Aortic valve conduit by Dr. Laneta Simmers on October 4th 2010 >> pericardial effusion >> window), CHB w/PPM, AFib  He was admitted yesterday for PPM generator change with possible A lead extraction//revision. No lad revision was done, A lead is functioning as programmed and given what appears to be quickly advancing dementia (?) and escalating AMS, lead revision (general anesthesia) procedure not pursued    Device information SJM dual chamber PPM implanted 05/17/2009,  New RV lead and gen change 09/17/2013. He has an abandoned RV lead, (2088) from 2010 Generator change 03/20/22 He is pacer dependent  Assessment & Plan    PPM s/p replacement AVR-mechanical Confusion/hospital delirium  CAD prior stenting AF persistent  HTN Restlessness   INR 1.8 continue coumadin  Ambulate  I wonder if seroquel is causing restlessness/motor twitching,  described in literature, not sure of time frame for symptoms,  Discussed with hospitalist.  Will stop Seroquel  gabapentin, and resume Wellbutrin   Will remove device bandage at discharge--steristrips will need to remain in place Able  to shower at discharge

## 2022-03-24 NOTE — TOC Progression Note (Addendum)
Transition of Care [facility]) - Progression Note    Patient Details  Name: Andre Malone MRN: [id number] Date of Birth: Dec 09, 1950  Transition of Care [facility]) CM/SW Contact  Ivette Loyal, [place] Phone Number: 03/24/2022, 11:23 AM  Clinical Narrative:    CSW referred pt to financial counseling to get medicaid application started.    Pt son and wife have been touring ALFs with the help of "a place for mom". Pt son will take pt back home with him until they are able to find a ALF, he will pick pt up at 2:30pm.   CSW contacted [facility], they are able to take pt and will follow up with pt son.  CSW signing off.         Expected Discharge Plan and Services                                                 Social Determinants of Health (SDOH) Interventions    Readmission Risk Interventions    03/05/2020   12:19 PM  Readmission Risk Prevention Plan  Transportation Screening Complete  PCP or Specialist Appt within 5-7 Days Not Complete  Not Complete comments Pt discharging to SNF  Home Care Screening Not Complete  Home Care Screening Not Completed Comments Pt discharging to SNF  Medication Review (RN CM) Complete

## 2022-03-24 NOTE — Discharge Instructions (Addendum)
Hold Plavix till 03/25/2022.  Can restart Plavix on 03/31/2022  Post procedure wound care instructions Keep incision clean and dry for 10 days. Leave steri-strips (little pieces of tape) on until seen in the office for wound check appointment. Call the office [phone]) for redness, drainage, swelling, or fever.

## 2022-03-24 NOTE — Discharge Summary (Addendum)
Physician Discharge Summary   Patient: Andre Malone MRN: [id number] DOB: 09-Nov-1950  Admit date:     03/20/2022  Discharge date: 03/24/22  Discharge Physician: Oswald Hillock   PCP: Chevis Pretty, FNP   Recommendations at discharge:   Follow-up PCP in 1 week to check kidney function  Discharge Diagnoses: Principal Problem:   Dementia associated with alcoholism, unspecified dementia severity, unspecified whether behavioral, psychotic, or mood disturbance or anxiety ([place])  Resolved Problems:   * No resolved hospital problems. *  Hospital Course: 71 year old male with past medical history of hypertension, hyperlipidemia, PAF on chronic anticoagulation, CAD s/p PCI, aortic stenosis s/p AVR, CHB s/p PPM in 2010 who was initially admitted for generator replacement and possible lead extraction/revision.  They were able to exchange the battery but lead extraction/W was not pursued.  Overnight patient became confused and combative.  Work-up for altered mental status including CT of the brain, thyroid testing, RPR, ammonia level were normal.  Neurology felt symptoms and dementia.  Psych was consulted. MRI brain ordered however could not be obtained as patient has pacemaker in place.   Psychiatry saw the patient and recommended low-dose Seroquel 25 mg twice daily.    Assessment and Plan:  Delirium on underlying dementia -Patient had waxing and waning mental status/behavior -Resolved -EEG was unremarkable for seizure, showed continuous generalized slowing -Reversible causes has been negative; ammonia 23, TSH 2.565, folate 12.1, B12 566, HIV nonreactive,, RPR nonreactive -Thiamine level obtained, result is pending; started empirically on thiamine 250 mg p.o. daily -Neurology and psychiatry has signed off -Patient started on Seroquel 25 mg twice daily, which will be discontinued as patient complains of restless leg syndrome.  He has not been able to sleep for past 3 days. -MRI could  not be obtained, as patient has known/old dependent pacing lead   S/p pacemaker generator change -EP following   Chronic kidney disease stage IIIb -Creatinine is elevated 2.03 -We will hold Lasix today -Change the dose of Lasix 20 mg p.o. daily from tomorrow morning  Hypokalemia' -potassium is 3.3 today - will replace potassium before discharge   Hypothyroidism -Continue Synthroid   CAD -History of PCI to RCA on 12/07/2020 -Patient currently on Plavix which is on hold due to generator change for 5 days -He can start taking Plavix from Monday, 03/22/2022   Mechanical aortic valve -Continue warfarin    Paroxysmal atrial fibrillation -CHA2DS2-VASc score is 4 -Continue warfarin   Hypertension -Blood pressure is stable -Continue furosemide -Dose of furosemide changed to 20 mg p.o. daily due to worsening renal function as above   Disposition -Plan to discharge to memory care facility, however no facility was available -Patient's son wants to take patient home. -We will discharge home with home health PT        Consultants: EP cardiology Procedures performed: Pacemaker generator change Disposition: Home Diet recommendation:  Discharge Diet Orders (From admission, onward)     Start     Ordered   03/24/22 0000  Diet - low sodium heart healthy        03/24/22 1140           Cardiac diet DISCHARGE MEDICATION: Allergies as of 03/24/2022       Reactions   Apresoline [hydralazine] Nausea And Vomiting   Crestor [rosuvastatin] Other (See Comments)   Myalgias on 5-40mg  daily dosing   Lipitor [atorvastatin] Other (See Comments)   achiness   Praluent [alirocumab] Other (See Comments)   fatigue   Ace Inhibitors Other (  See Comments)   cough   Benadryl [diphenhydramine Hcl] Other (See Comments)   Makes restless legs worse   Diovan [valsartan] Other (See Comments)   shuts down Kidney   Imdur [isosorbide Dinitrate] Other (See Comments)   headache   Lopressor  [metoprolol] Other (See Comments)   Kidney failure   Nsaids Other (See Comments)   REACTION: Currently taking Coumadin        Medication List     STOP taking these medications    metFORMIN 1000 MG tablet Commonly known as: GLUCOPHAGE       TAKE these medications    acetaminophen 500 MG tablet Commonly known as: TYLENOL Take 2 tablets (1,000 mg total) by mouth every 6 (six) hours. What changed:  when to take this reasons to take this   amLODipine 5 MG tablet Commonly known as: NORVASC Take 0.5 tablets (2.5 mg total) by mouth daily. What changed: when to take this   buPROPion 150 MG 24 hr tablet Commonly known as: WELLBUTRIN XL TAKE 1 TABLET BY MOUTH EVERY DAY   carboxymethylcellulose 0.5 % Soln Commonly known as: REFRESH PLUS INSTILL 1 DROP IN BOTH EYES FOUR TIMES A DAY AS NEEDED   clopidogrel 75 MG tablet Commonly known as: PLAVIX Take 1 tablet (75 mg total) by mouth daily with breakfast. Start taking on: March 26, 2022 What changed: These instructions start on March 26, 2022. If you are unsure what to do until then, ask your doctor or other care provider.   furosemide 20 MG tablet Commonly known as: Lasix Take 1 tablet (20 mg total) by mouth daily. Start taking on: March 25, 2022 What changed:  medication strength how much to take   lactase 3000 units tablet Commonly known as: LACTAID Take 9,000 Units by mouth daily as needed (when consuming dairy products).   levothyroxine 112 MCG tablet Commonly known as: SYNTHROID Take 1 tablet (112 mcg total) by mouth daily.   multivitamin with minerals Tabs tablet Take 1 tablet by mouth daily.   nitroGLYCERIN 0.4 MG SL tablet Commonly known as: NITROSTAT Place 1 tablet (0.4 mg total) under the tongue every 5 (five) minutes as needed for chest pain (Do not give more than 3 SL tablets in 15 minutes.).   Oxycodone HCl 10 MG Tabs Take 10 mg by mouth every 4 (four) hours as needed for pain.   polyethylene glycol  17 g packet Commonly known as: MIRALAX / GLYCOLAX Take 17 g by mouth daily as needed for mild constipation.   senna-docusate 8.6-50 MG tablet Commonly known as: Senokot-S Take 1 tablet by mouth daily.   sertraline 100 MG tablet Commonly known as: ZOLOFT Take 1 tablet (100 mg total) by mouth daily.   traZODone 100 MG tablet Commonly known as: DESYREL Take 1 tablet (100 mg total) by mouth at bedtime. What changed:  when to take this reasons to take this   VITAMIN D PO Take 1 tablet by mouth daily.   warfarin 5 MG tablet Commonly known as: COUMADIN Take as directed. If you are unsure how to take this medication, talk to your nurse or doctor. Original instructions: Take 1 tablet (5 mg total) by mouth daily.        Follow-up Information     Daphine Deutscher, [name], FNP Follow up in 1 week(s).   Specialty: Family Medicine Why: Check renal function in  1 week Contact information: [address] [place] [address] [phone]         Verne Carrow  D, MD .   Specialty: Cardiology Contact information: [address] [place] [address] [phone]                Discharge Exam: Danley Danker Weights   03/20/22 1656  Weight: 89.1 kg   General-appears in no acute distress Heart-S1-S2, regular, no murmur auscultated Lungs-clear to auscultation bilaterally, no wheezing or crackles auscultated Abdomen-soft, nontender, no organomegaly Extremities-no edema in the lower extremities Neuro-alert, oriented x3, no focal deficit noted  Condition at discharge: good  The results of significant diagnostics from this hospitalization (including imaging, microbiology, ancillary and laboratory) are listed below for reference.   Imaging Studies: EEG adult  Result Date: April 02, 2022 Lora Havens, MD     Apr 02, 2022  4:41 PM Patient Name: NOELLE DOTO MRN: [id number] Epilepsy Attending: Lora Havens Referring Physician/Provider: Lorenza Chick, MD Date: 04-02-22 Duration: 21.42 mins Patient history: 71yo M with ams. EEG to evaluate for seizure. Level of alertness: Awake AEDs during EEG study: None Technical aspects: This EEG study was done with scalp electrodes positioned according to the 10-20 International system of electrode placement. Electrical activity was acquired at a sampling rate of 500Hz  and reviewed with a high frequency filter of 70Hz  and a low frequency filter of 1Hz . EEG data were recorded continuously and digitally stored. Description: The posterior dominant rhythm consists of 7 Hz activity of moderate voltage (25-35 uV) seen predominantly in posterior head regions, symmetric and reactive to eye opening and eye closing. EEG showed continuous generalized 3 to 6 Hz theta-delta slowing. Hyperventilation and photic stimulation were not performed.   ABNORMALITY - Continuous slow, generalized - Background slow IMPRESSION: This study is suggestive of moderate diffuse encephalopathy, nonspecific etiology. No seizures or epileptiform discharges were seen throughout the recording. Lora Havens   EP PPM/ICD IMPLANT  Result Date: 03/20/2022 Conclusion: Successful removal of a previously implanted dual-chamber pacemaker which had reached elective replacement, and insertion of a new dual-chamber and a patient with complete heart block and no ventricular escape. Cristopher Peru, MD   DG Chest Port 1 View  Result Date: 03/17/2022 CLINICAL DATA:  Altered mental status, status post Bentall procedure EXAM: PORTABLE CHEST 1 VIEW COMPARISON:  None Available. FINDINGS: Lung volumes are small, but are symmetric and are clear. No pneumothorax or pleural effusion. Aortic valve replacement has been performed. Cardiac size is within normal limits when accounting for poor pulmonary insufflation. Right subclavian pacemaker is in place with single lead overlying the right atrium and 2 leads overlying the right ventricle, unchanged. Pulmonary  vascularity is normal. Healed left rib fractures noted. No acute bone abnormality. IMPRESSION: 1. Pulmonary hypoinflation. 2. Status post aortic valve replacement. Electronically Signed   By: Fidela Salisbury M.D.   On: 03/17/2022 21:50   CT Head Wo Contrast  Result Date: 03/17/2022 CLINICAL DATA:  Altered mental status. EXAM: CT HEAD WITHOUT CONTRAST TECHNIQUE: Contiguous axial images were obtained from the base of the skull through the vertex without intravenous contrast. RADIATION DOSE REDUCTION: This exam was performed according to the departmental dose-optimization program which includes automated exposure control, adjustment of the mA and/or kV according to patient size and/or use of iterative reconstruction technique. COMPARISON:  Head CT dated 01/14/2021. FINDINGS: Brain: Mild age-related atrophy and chronic microvascular ischemic changes. There is no acute intracranial hemorrhage. No mass effect or midline shift. No extra-axial fluid collection. Vascular: No hyperdense vessel or unexpected calcification. Skull: Normal. Negative for fracture or focal lesion. Sinuses/Orbits: Multifocal periosteal thickening  of paranasal sinuses. No air-fluid. The mastoid air cells are clear. Other: None IMPRESSION: 1. No acute intracranial pathology. 2. Mild age-related atrophy and chronic microvascular ischemic changes. Electronically Signed   By: Elgie Collard M.D.   On: 03/17/2022 21:44    Microbiology: Results for orders placed or performed during the hospital encounter of 03/20/22  Surgical pcr screen     Status: Abnormal   Collection Time: 03/20/22  1:25 PM   Specimen: Nasal Mucosa; Nasal Swab  Result Value Ref Range Status   MRSA, PCR NEGATIVE NEGATIVE Final   Staphylococcus aureus POSITIVE (A) NEGATIVE Final    Comment: (NOTE) The Xpert SA Assay (FDA approved for NASAL specimens in patients 23 years of age and older), is one component of a comprehensive surveillance program. It is not intended to  diagnose infection nor to guide or monitor treatment. Performed at [facility], [address], [place], [place] [address]     Labs: CBC: Recent Labs  Lab 03/17/22 1947 03/24/22 0116  WBC 6.2 6.6  HGB 12.1* 12.3*  HCT 37.3* 37.5*  MCV 90.1 88.9  PLT 155 144*   Basic Metabolic Panel: Recent Labs  Lab 03/17/22 1947 03/23/22 0110  NA 137 137  K 4.7 3.3*  CL 103 99  CO2 26 25  GLUCOSE 164* 160*  BUN 15 29*  CREATININE 1.91* 2.03*  CALCIUM 10.0 8.6*   Liver Function Tests: Recent Labs  Lab 03/17/22 1947  AST 25  ALT 8  ALKPHOS 54  BILITOT 0.7  PROT 7.5  ALBUMIN 4.5   CBG: Recent Labs  Lab 03/20/22 1324  GLUCAP 143*    Discharge time spent: greater than 30 minutes.  Signed: Meredeth Ide, MD [facility] 03/24/2022

## 2022-03-24 NOTE — Progress Notes (Signed)
Patient's son, Molli Hazard at bedside for discharge.Son states that patient will be discharging home with him.

## 2022-03-24 NOTE — Progress Notes (Signed)
[facility]  PROGRESS NOTE  Andre Malone OAC:[id number] DOB: 09-Sep-1950 DOA: 03/20/2022 PCP: Bennie Pierini, FNP   Brief HPI:    71 year old male with past medical history of hypertension, hyperlipidemia, PAF on chronic anticoagulation, CAD s/p PCI, aortic stenosis s/p AVR, CHB s/p PPM in 2010 who was initially admitted for generator replacement and possible lead extraction/revision.  They were able to exchange the battery but lead extraction/W was not pursued.  Overnight patient became confused and combative.  Work-up for altered mental status including CT of the brain, thyroid testing, RPR, ammonia level were normal.  Neurology felt symptoms and dementia.  Psych was consulted. MRI brain ordered however could not be obtained as patient has pacemaker in place.  Psychiatry saw the patient and recommended low-dose Seroquel 25 mg twice daily.   Subjective   Patient seen and examined, complains of restlessness in legs.  Still unable to sleep at night.   Assessment/Plan:    Delirium on underlying dementia -Patient has waxing and waning mental status/behavior -Slowly improving -EEG was unremarkable for seizure, showed continuous generalized slowing -Reversible causes has been negative; ammonia 23, TSH 2.565, folate 12.1, B12 566, HIV nonreactive,, RPR nonreactive -Thiamine level obtained, result is pending; started empirically on thiamine 250 mg p.o. daily -Neurology and psychiatry has signed off -Patient started on Seroquel 25 mg twice daily -Patient is on bupropion, sertraline, trazodone; will discontinue bupropion as it can be very activating medication, making it difficult to sleep at night -MRI could not be obtained, as patient has known/old dependent pacing lead  S/p pacemaker generator change -Cardiology following  Chronic kidney disease stage IIIb -Creatinine is now rising, today creatinine 2.03 -We will hold Lasix for now -Repeat BMP today  Restless leg  syndrome -Patient having difficulty sleeping at night due to restless leg syndrome -He does have a history of RLS but not on medications for that -We will start gabapentin 100 mg p.o. nightly  Chronic diastolic CHF -He is euvolemic at this time -We will hold Lasix -Might have to cut down the dose of Lasix to 20 mg daily due to worsening renal function  Hypokalemia -Potassium was 3.3 this morning -Replace potassium and follow BMP in am   Hypothyroidism -Continue Synthroid  CAD -History of PCI to RCA on 12/07/2020 -Patient currently on Plavix which is on hold due to generator change for 5 days  Mechanical aortic valve -Continue warfarin -Cardiology does not want to increase the INR quickly as patient has a fresh pacer pocket  Paroxysmal atrial fibrillation -CHA2DS2-VASc score is 4 -Continue warfarin  Hypertension -Blood pressure is mildly elevated -Start amlodipine 5 mg daily   Disposition -Plan to discharge to memory care facility -TOC following   Medications     sodium chloride   Intravenous Once   acetaminophen  500 mg Oral Q6H   furosemide  40 mg Oral Daily   gabapentin  100 mg Oral QHS   levothyroxine  112 mcg Oral Q0600   multivitamin with minerals  1 tablet Oral Daily   potassium chloride  20 mEq Oral Once   QUEtiapine  25 mg Oral BID   senna-docusate  1 tablet Oral Daily   sertraline  100 mg Oral Daily   thiamine  250 mg Oral Daily   traZODone  100 mg Oral QHS   Warfarin - Pharmacist Dosing Inpatient   Does not apply q1600     Data Reviewed:   CBG:  Recent Labs  Lab 03/20/22 1324  GLUCAP 143*  SpO2: 96 %    Vitals:   03/23/22 1207 03/23/22 1926 03/23/22 2306 03/24/22 0814  BP: (!) 149/63 (!) 146/73 (!) 149/109 (!) 148/79  Pulse: 60 60 72   Resp: 20 18 20    Temp: 98.1 F (36.7 C) (!) 97.3 F (36.3 C) 98 F (36.7 C) 98.4 F (36.9 C)  TempSrc: Oral Oral Oral Oral  SpO2: 91% 95% 98% 96%  Weight:      Height:          Data  Reviewed:  Basic Metabolic Panel: Recent Labs  Lab 03/17/22 1947 03/23/22 0110  NA 137 137  K 4.7 3.3*  CL 103 99  CO2 26 25  GLUCOSE 164* 160*  BUN 15 29*  CREATININE 1.91* 2.03*  CALCIUM 10.0 8.6*    CBC: Recent Labs  Lab 03/17/22 1947 03/24/22 0116  WBC 6.2 6.6  HGB 12.1* 12.3*  HCT 37.3* 37.5*  MCV 90.1 88.9  PLT 155 144*    LFT Recent Labs  Lab 03/17/22 1947  AST 25  ALT 8  ALKPHOS 54  BILITOT 0.7  PROT 7.5  ALBUMIN 4.5     Antibiotics: Anti-infectives (From admission, onward)    Start     Dose/Rate Route Frequency Ordered Stop   03/20/22 2200  ceFAZolin (ANCEF) IVPB 1 g/50 mL premix        1 g 100 mL/hr over 30 Minutes Intravenous  Once 03/20/22 1619 03/20/22 2241   03/20/22 1330  gentamicin (GARAMYCIN) 80 mg in sodium chloride 0.9 % 500 mL irrigation        80 mg Irrigation On call 03/20/22 1324 03/20/22 1526   03/20/22 1330  ceFAZolin (ANCEF) IVPB 2g/100 mL premix        2 g 200 mL/hr over 30 Minutes Intravenous On call 03/20/22 1324 03/20/22 1615        DVT prophylaxis: Warfarin  Code Status: Full code  Family Communication: Spoke to patient's son on phone, still family looking for facility for patient.   CONSULTS cardiology   Objective    Physical Examination:  General-appears in no acute distress Heart-S1-S2, regular, no murmur auscultated Lungs-clear to auscultation bilaterally, no wheezing or crackles auscultated Abdomen-soft, nontender, no organomegaly Extremities-no edema in the lower extremities Neuro-alert, oriented x3, no focal deficit noted   Status is: Inpatient: Altered mental status     Yasuko Lapage S Devorah Givhan   [facility] If 7PM-7AM, please contact night-coverage at [email or web address], Office  [phone]   03/24/2022, 8:24 AM  LOS: 4 days

## 2022-03-24 NOTE — Progress Notes (Signed)
Notified of pending discharge Tegaderm removed without difficulty Steri strips remain Small area of ecchymosis inf/lateral area of pocket No bleeding No hematoma  EP follow up/wound check is in place Wound care reviewed with the patient and placed in the AVS  Francis Dowse, PA-C

## 2022-03-26 ENCOUNTER — Inpatient Hospital Stay (HOSPITAL_COMMUNITY)
Admission: EM | Admit: 2022-03-26 | Discharge: 2022-04-04 | DRG: 056 | Disposition: E | Payer: Medicare HMO | Attending: Internal Medicine | Admitting: Internal Medicine

## 2022-03-26 ENCOUNTER — Emergency Department (HOSPITAL_COMMUNITY): Payer: Medicare HMO

## 2022-03-26 DIAGNOSIS — N1832 Chronic kidney disease, stage 3b: Secondary | ICD-10-CM | POA: Diagnosis present

## 2022-03-26 DIAGNOSIS — R0602 Shortness of breath: Secondary | ICD-10-CM | POA: Diagnosis not present

## 2022-03-26 DIAGNOSIS — R0902 Hypoxemia: Secondary | ICD-10-CM | POA: Diagnosis not present

## 2022-03-26 DIAGNOSIS — R079 Chest pain, unspecified: Secondary | ICD-10-CM | POA: Diagnosis not present

## 2022-03-26 DIAGNOSIS — Z951 Presence of aortocoronary bypass graft: Secondary | ICD-10-CM

## 2022-03-26 DIAGNOSIS — F1021 Alcohol dependence, in remission: Secondary | ICD-10-CM | POA: Diagnosis present

## 2022-03-26 DIAGNOSIS — I442 Atrioventricular block, complete: Secondary | ICD-10-CM | POA: Diagnosis present

## 2022-03-26 DIAGNOSIS — Z886 Allergy status to analgesic agent status: Secondary | ICD-10-CM

## 2022-03-26 DIAGNOSIS — Z823 Family history of stroke: Secondary | ICD-10-CM

## 2022-03-26 DIAGNOSIS — Z79890 Hormone replacement therapy (postmenopausal): Secondary | ICD-10-CM

## 2022-03-26 DIAGNOSIS — Z743 Need for continuous supervision: Secondary | ICD-10-CM | POA: Diagnosis not present

## 2022-03-26 DIAGNOSIS — I214 Non-ST elevation (NSTEMI) myocardial infarction: Principal | ICD-10-CM | POA: Diagnosis present

## 2022-03-26 DIAGNOSIS — Z79899 Other long term (current) drug therapy: Secondary | ICD-10-CM

## 2022-03-26 DIAGNOSIS — F02C11 Dementia in other diseases classified elsewhere, severe, with agitation: Secondary | ICD-10-CM | POA: Diagnosis present

## 2022-03-26 DIAGNOSIS — I712 Thoracic aortic aneurysm, without rupture, unspecified: Secondary | ICD-10-CM | POA: Diagnosis present

## 2022-03-26 DIAGNOSIS — G2581 Restless legs syndrome: Secondary | ICD-10-CM | POA: Diagnosis present

## 2022-03-26 DIAGNOSIS — F03918 Unspecified dementia, unspecified severity, with other behavioral disturbance: Secondary | ICD-10-CM | POA: Diagnosis present

## 2022-03-26 DIAGNOSIS — I700 Atherosclerosis of aorta: Secondary | ICD-10-CM | POA: Diagnosis not present

## 2022-03-26 DIAGNOSIS — Z515 Encounter for palliative care: Secondary | ICD-10-CM

## 2022-03-26 DIAGNOSIS — Z781 Physical restraint status: Secondary | ICD-10-CM

## 2022-03-26 DIAGNOSIS — G309 Alzheimer's disease, unspecified: Principal | ICD-10-CM | POA: Diagnosis present

## 2022-03-26 DIAGNOSIS — E1122 Type 2 diabetes mellitus with diabetic chronic kidney disease: Secondary | ICD-10-CM | POA: Diagnosis present

## 2022-03-26 DIAGNOSIS — Z952 Presence of prosthetic heart valve: Secondary | ICD-10-CM

## 2022-03-26 DIAGNOSIS — Z7901 Long term (current) use of anticoagulants: Secondary | ICD-10-CM

## 2022-03-26 DIAGNOSIS — Z66 Do not resuscitate: Secondary | ICD-10-CM | POA: Diagnosis not present

## 2022-03-26 DIAGNOSIS — I480 Paroxysmal atrial fibrillation: Secondary | ICD-10-CM | POA: Diagnosis present

## 2022-03-26 DIAGNOSIS — F02C3 Dementia in other diseases classified elsewhere, severe, with mood disturbance: Secondary | ICD-10-CM | POA: Diagnosis present

## 2022-03-26 DIAGNOSIS — R0789 Other chest pain: Secondary | ICD-10-CM | POA: Diagnosis not present

## 2022-03-26 DIAGNOSIS — Z811 Family history of alcohol abuse and dependence: Secondary | ICD-10-CM

## 2022-03-26 DIAGNOSIS — I2510 Atherosclerotic heart disease of native coronary artery without angina pectoris: Secondary | ICD-10-CM | POA: Diagnosis present

## 2022-03-26 DIAGNOSIS — E039 Hypothyroidism, unspecified: Secondary | ICD-10-CM | POA: Diagnosis present

## 2022-03-26 DIAGNOSIS — Z888 Allergy status to other drugs, medicaments and biological substances status: Secondary | ICD-10-CM

## 2022-03-26 DIAGNOSIS — E1169 Type 2 diabetes mellitus with other specified complication: Secondary | ICD-10-CM | POA: Diagnosis present

## 2022-03-26 DIAGNOSIS — Z6831 Body mass index (BMI) 31.0-31.9, adult: Secondary | ICD-10-CM

## 2022-03-26 DIAGNOSIS — I10 Essential (primary) hypertension: Secondary | ICD-10-CM | POA: Diagnosis present

## 2022-03-26 DIAGNOSIS — Z9861 Coronary angioplasty status: Secondary | ICD-10-CM

## 2022-03-26 DIAGNOSIS — J690 Pneumonitis due to inhalation of food and vomit: Secondary | ICD-10-CM

## 2022-03-26 DIAGNOSIS — I130 Hypertensive heart and chronic kidney disease with heart failure and stage 1 through stage 4 chronic kidney disease, or unspecified chronic kidney disease: Secondary | ICD-10-CM | POA: Diagnosis present

## 2022-03-26 DIAGNOSIS — Z95810 Presence of automatic (implantable) cardiac defibrillator: Secondary | ICD-10-CM

## 2022-03-26 DIAGNOSIS — H548 Legal blindness, as defined in USA: Secondary | ICD-10-CM | POA: Diagnosis present

## 2022-03-26 DIAGNOSIS — I959 Hypotension, unspecified: Secondary | ICD-10-CM | POA: Diagnosis not present

## 2022-03-26 DIAGNOSIS — E785 Hyperlipidemia, unspecified: Secondary | ICD-10-CM | POA: Diagnosis present

## 2022-03-26 DIAGNOSIS — J9601 Acute respiratory failure with hypoxia: Secondary | ICD-10-CM | POA: Diagnosis not present

## 2022-03-26 DIAGNOSIS — E1140 Type 2 diabetes mellitus with diabetic neuropathy, unspecified: Secondary | ICD-10-CM

## 2022-03-26 DIAGNOSIS — F05 Delirium due to known physiological condition: Secondary | ICD-10-CM | POA: Diagnosis present

## 2022-03-26 DIAGNOSIS — Z8249 Family history of ischemic heart disease and other diseases of the circulatory system: Secondary | ICD-10-CM

## 2022-03-26 DIAGNOSIS — I21A1 Myocardial infarction type 2: Secondary | ICD-10-CM | POA: Diagnosis present

## 2022-03-26 DIAGNOSIS — Z7902 Long term (current) use of antithrombotics/antiplatelets: Secondary | ICD-10-CM

## 2022-03-26 DIAGNOSIS — E669 Obesity, unspecified: Secondary | ICD-10-CM | POA: Diagnosis present

## 2022-03-26 DIAGNOSIS — Z96642 Presence of left artificial hip joint: Secondary | ICD-10-CM | POA: Diagnosis present

## 2022-03-26 DIAGNOSIS — F1027 Alcohol dependence with alcohol-induced persisting dementia: Secondary | ICD-10-CM | POA: Diagnosis present

## 2022-03-26 DIAGNOSIS — I5042 Chronic combined systolic (congestive) and diastolic (congestive) heart failure: Secondary | ICD-10-CM | POA: Diagnosis present

## 2022-03-26 NOTE — ED Triage Notes (Signed)
Pt Andre Malone from independent living reporting right sided chest pain in same area where he had a recent pacemaker placement. Patient reports no CP to EMS but reproducable angina when walking and taking deep breaths. VS stable, hx of dementia. Prescribed tylenol but may not be taking correctly or at all.

## 2022-03-27 ENCOUNTER — Encounter (HOSPITAL_COMMUNITY): Payer: Self-pay | Admitting: Internal Medicine

## 2022-03-27 ENCOUNTER — Inpatient Hospital Stay (HOSPITAL_COMMUNITY): Payer: Medicare HMO

## 2022-03-27 DIAGNOSIS — I214 Non-ST elevation (NSTEMI) myocardial infarction: Secondary | ICD-10-CM | POA: Diagnosis not present

## 2022-03-27 DIAGNOSIS — E039 Hypothyroidism, unspecified: Secondary | ICD-10-CM | POA: Diagnosis not present

## 2022-03-27 DIAGNOSIS — I21A1 Myocardial infarction type 2: Secondary | ICD-10-CM | POA: Diagnosis not present

## 2022-03-27 DIAGNOSIS — E669 Obesity, unspecified: Secondary | ICD-10-CM | POA: Diagnosis not present

## 2022-03-27 DIAGNOSIS — I480 Paroxysmal atrial fibrillation: Secondary | ICD-10-CM | POA: Diagnosis not present

## 2022-03-27 DIAGNOSIS — Z6831 Body mass index (BMI) 31.0-31.9, adult: Secondary | ICD-10-CM | POA: Diagnosis not present

## 2022-03-27 DIAGNOSIS — R451 Restlessness and agitation: Secondary | ICD-10-CM

## 2022-03-27 DIAGNOSIS — Z952 Presence of prosthetic heart valve: Secondary | ICD-10-CM | POA: Diagnosis not present

## 2022-03-27 DIAGNOSIS — I712 Thoracic aortic aneurysm, without rupture, unspecified: Secondary | ICD-10-CM | POA: Diagnosis not present

## 2022-03-27 DIAGNOSIS — E1169 Type 2 diabetes mellitus with other specified complication: Secondary | ICD-10-CM | POA: Diagnosis not present

## 2022-03-27 DIAGNOSIS — I2510 Atherosclerotic heart disease of native coronary artery without angina pectoris: Secondary | ICD-10-CM | POA: Diagnosis not present

## 2022-03-27 DIAGNOSIS — R0602 Shortness of breath: Secondary | ICD-10-CM | POA: Diagnosis not present

## 2022-03-27 DIAGNOSIS — E1122 Type 2 diabetes mellitus with diabetic chronic kidney disease: Secondary | ICD-10-CM | POA: Diagnosis not present

## 2022-03-27 DIAGNOSIS — R69 Illness, unspecified: Secondary | ICD-10-CM | POA: Diagnosis not present

## 2022-03-27 DIAGNOSIS — G4751 Confusional arousals: Secondary | ICD-10-CM

## 2022-03-27 DIAGNOSIS — F1027 Alcohol dependence with alcohol-induced persisting dementia: Secondary | ICD-10-CM | POA: Diagnosis not present

## 2022-03-27 DIAGNOSIS — I10 Essential (primary) hypertension: Secondary | ICD-10-CM | POA: Diagnosis not present

## 2022-03-27 DIAGNOSIS — F02C3 Dementia in other diseases classified elsewhere, severe, with mood disturbance: Secondary | ICD-10-CM | POA: Diagnosis not present

## 2022-03-27 DIAGNOSIS — J690 Pneumonitis due to inhalation of food and vomit: Secondary | ICD-10-CM | POA: Diagnosis not present

## 2022-03-27 DIAGNOSIS — Z66 Do not resuscitate: Secondary | ICD-10-CM | POA: Diagnosis not present

## 2022-03-27 DIAGNOSIS — J9601 Acute respiratory failure with hypoxia: Secondary | ICD-10-CM | POA: Diagnosis not present

## 2022-03-27 DIAGNOSIS — I130 Hypertensive heart and chronic kidney disease with heart failure and stage 1 through stage 4 chronic kidney disease, or unspecified chronic kidney disease: Secondary | ICD-10-CM | POA: Diagnosis not present

## 2022-03-27 DIAGNOSIS — N1832 Chronic kidney disease, stage 3b: Secondary | ICD-10-CM | POA: Diagnosis not present

## 2022-03-27 DIAGNOSIS — F05 Delirium due to known physiological condition: Secondary | ICD-10-CM | POA: Diagnosis not present

## 2022-03-27 DIAGNOSIS — I248 Other forms of acute ischemic heart disease: Secondary | ICD-10-CM | POA: Diagnosis not present

## 2022-03-27 DIAGNOSIS — J189 Pneumonia, unspecified organism: Secondary | ICD-10-CM | POA: Diagnosis not present

## 2022-03-27 DIAGNOSIS — G2581 Restless legs syndrome: Secondary | ICD-10-CM | POA: Diagnosis not present

## 2022-03-27 DIAGNOSIS — Z515 Encounter for palliative care: Secondary | ICD-10-CM | POA: Diagnosis not present

## 2022-03-27 DIAGNOSIS — F02C11 Dementia in other diseases classified elsewhere, severe, with agitation: Secondary | ICD-10-CM | POA: Diagnosis not present

## 2022-03-27 DIAGNOSIS — G309 Alzheimer's disease, unspecified: Secondary | ICD-10-CM | POA: Diagnosis not present

## 2022-03-27 DIAGNOSIS — J9690 Respiratory failure, unspecified, unspecified whether with hypoxia or hypercapnia: Secondary | ICD-10-CM | POA: Diagnosis not present

## 2022-03-27 DIAGNOSIS — I442 Atrioventricular block, complete: Secondary | ICD-10-CM | POA: Diagnosis not present

## 2022-03-27 DIAGNOSIS — I5042 Chronic combined systolic (congestive) and diastolic (congestive) heart failure: Secondary | ICD-10-CM | POA: Diagnosis not present

## 2022-03-27 DIAGNOSIS — E1140 Type 2 diabetes mellitus with diabetic neuropathy, unspecified: Secondary | ICD-10-CM | POA: Diagnosis not present

## 2022-03-27 DIAGNOSIS — F03918 Unspecified dementia, unspecified severity, with other behavioral disturbance: Secondary | ICD-10-CM | POA: Diagnosis not present

## 2022-03-27 DIAGNOSIS — F1021 Alcohol dependence, in remission: Secondary | ICD-10-CM | POA: Diagnosis not present

## 2022-03-27 LAB — CBC WITH DIFFERENTIAL/PLATELET
Abs Immature Granulocytes: 0.02 10*3/uL (ref 0.00–0.07)
Basophils Absolute: 0 10*3/uL (ref 0.0–0.1)
Basophils Relative: 0 %
Eosinophils Absolute: 0.2 10*3/uL (ref 0.0–0.5)
Eosinophils Relative: 3 %
HCT: 36.8 % — ABNORMAL LOW (ref 39.0–52.0)
Hemoglobin: 12.2 g/dL — ABNORMAL LOW (ref 13.0–17.0)
Immature Granulocytes: 0 %
Lymphocytes Relative: 12 %
Lymphs Abs: 0.9 10*3/uL (ref 0.7–4.0)
MCH: 29.5 pg (ref 26.0–34.0)
MCHC: 33.2 g/dL (ref 30.0–36.0)
MCV: 88.9 fL (ref 80.0–100.0)
Monocytes Absolute: 0.7 10*3/uL (ref 0.1–1.0)
Monocytes Relative: 10 %
Neutro Abs: 5.7 10*3/uL (ref 1.7–7.7)
Neutrophils Relative %: 75 %
Platelets: 162 10*3/uL (ref 150–400)
RBC: 4.14 MIL/uL — ABNORMAL LOW (ref 4.22–5.81)
RDW: 15 % (ref 11.5–15.5)
WBC: 7.6 10*3/uL (ref 4.0–10.5)
nRBC: 0 % (ref 0.0–0.2)

## 2022-03-27 LAB — COMPREHENSIVE METABOLIC PANEL
ALT: 17 U/L (ref 0–44)
AST: 34 U/L (ref 15–41)
Albumin: 3.9 g/dL (ref 3.5–5.0)
Alkaline Phosphatase: 68 U/L (ref 38–126)
Anion gap: 7 (ref 5–15)
BUN: 27 mg/dL — ABNORMAL HIGH (ref 8–23)
CO2: 27 mmol/L (ref 22–32)
Calcium: 9.6 mg/dL (ref 8.9–10.3)
Chloride: 103 mmol/L (ref 98–111)
Creatinine, Ser: 1.96 mg/dL — ABNORMAL HIGH (ref 0.61–1.24)
GFR, Estimated: 36 mL/min — ABNORMAL LOW (ref >60)
Glucose, Bld: 131 mg/dL — ABNORMAL HIGH (ref 70–99)
Potassium: 3.7 mmol/L (ref 3.5–5.1)
Sodium: 137 mmol/L (ref 135–145)
Total Bilirubin: 0.7 mg/dL (ref 0.3–1.2)
Total Protein: 7.1 g/dL (ref 6.5–8.1)

## 2022-03-27 LAB — PROTIME-INR
INR: 1.5 — ABNORMAL HIGH (ref 0.8–1.2)
Prothrombin Time: 17.5 seconds — ABNORMAL HIGH (ref 11.4–15.2)

## 2022-03-27 LAB — HEPARIN LEVEL (UNFRACTIONATED): Heparin Unfractionated: <0.1 IU/mL — ABNORMAL LOW (ref 0.30–0.70)

## 2022-03-27 LAB — TROPONIN I (HIGH SENSITIVITY)
Troponin I (High Sensitivity): 452 ng/L (ref <18)
Troponin I (High Sensitivity): 447 ng/L (ref <18)

## 2022-03-27 MED ORDER — ONDANSETRON HCL 4 MG/2ML IJ SOLN: 4.0000 mg | Freq: Four times a day (QID) | INTRAMUSCULAR | Status: DC | PRN

## 2022-03-27 MED ORDER — SENNOSIDES-DOCUSATE SODIUM 8.6-50 MG PO TABS
1.0000 | ORAL_TABLET | Freq: Every day | ORAL | Status: DC
  Administered 2022-03-27: 1 via ORAL
  Filled 2022-03-27: qty 1

## 2022-03-27 MED ORDER — DIVALPROEX SODIUM 125 MG PO CSDR
125.0000 mg | DELAYED_RELEASE_CAPSULE | Freq: Three times a day (TID) | ORAL | Status: DC
Start: 2022-03-27 — End: 2022-03-30
  Administered 2022-03-27: 125 mg via ORAL
  Filled 2022-03-27 (×9): qty 1

## 2022-03-27 MED ORDER — OXYCODONE HCL 5 MG PO TABS
10.0000 mg | ORAL_TABLET | ORAL | Status: DC | PRN
  Administered 2022-03-27 – 2022-03-28 (×5): 10 mg via ORAL
  Filled 2022-03-27 (×5): qty 2

## 2022-03-27 MED ORDER — BUPROPION HCL ER (XL) 150 MG PO TB24
150.0000 mg | ORAL_TABLET | Freq: Every day | ORAL | Status: DC
  Administered 2022-03-27: 150 mg via ORAL
  Filled 2022-03-27 (×2): qty 1

## 2022-03-27 MED ORDER — NITROGLYCERIN 0.4 MG SL SUBL: 0.4000 mg | SUBLINGUAL_TABLET | SUBLINGUAL | Status: DC | PRN

## 2022-03-27 MED ORDER — HALOPERIDOL LACTATE 5 MG/ML IJ SOLN
2.5000 mg | Freq: Once | INTRAMUSCULAR | Status: AC
  Administered 2022-03-27: 2.5 mg via INTRAVENOUS

## 2022-03-27 MED ORDER — LEVOTHYROXINE SODIUM 112 MCG PO TABS
112.0000 ug | ORAL_TABLET | Freq: Every day | ORAL | Status: DC
  Administered 2022-03-27: 112 ug via ORAL
  Filled 2022-03-27 (×2): qty 1

## 2022-03-27 MED ORDER — FUROSEMIDE 20 MG PO TABS
20.0000 mg | ORAL_TABLET | Freq: Every day | ORAL | Status: DC
  Administered 2022-03-27: 20 mg via ORAL
  Filled 2022-03-27: qty 1

## 2022-03-27 MED ORDER — POLYETHYLENE GLYCOL 3350 17 G PO PACK: 17.0000 g | PACK | Freq: Every day | ORAL | Status: DC | PRN

## 2022-03-27 MED ORDER — ACETAMINOPHEN 325 MG PO TABS
650.0000 mg | ORAL_TABLET | ORAL | Status: DC | PRN
  Administered 2022-03-27 (×3): 650 mg via ORAL
  Filled 2022-03-27 (×4): qty 2

## 2022-03-27 MED ORDER — SERTRALINE HCL 100 MG PO TABS
100.0000 mg | ORAL_TABLET | Freq: Every day | ORAL | Status: DC
  Administered 2022-03-27: 100 mg via ORAL
  Filled 2022-03-27: qty 1

## 2022-03-27 MED ORDER — INSULIN ASPART 100 UNIT/ML IJ SOLN
0.0000 [IU] | Freq: Three times a day (TID) | INTRAMUSCULAR | Status: DC
  Administered 2022-03-28 – 2022-03-29 (×5): 2 [IU] via SUBCUTANEOUS

## 2022-03-27 MED ORDER — CLOPIDOGREL BISULFATE 75 MG PO TABS
75.0000 mg | ORAL_TABLET | Freq: Every day | ORAL | Status: DC
  Administered 2022-03-27: 75 mg via ORAL
  Filled 2022-03-27: qty 1

## 2022-03-27 MED ORDER — HEPARIN BOLUS VIA INFUSION
2000.0000 [IU] | Freq: Once | INTRAVENOUS | Status: AC
  Administered 2022-03-27: 2000 [IU] via INTRAVENOUS
  Filled 2022-03-27: qty 2000

## 2022-03-27 MED ORDER — TRAZODONE HCL 100 MG PO TABS
100.0000 mg | ORAL_TABLET | Freq: Every day | ORAL | Status: DC
  Administered 2022-03-27: 100 mg via ORAL
  Filled 2022-03-27: qty 2
  Filled 2022-03-27 (×2): qty 1

## 2022-03-27 MED ORDER — HEPARIN (PORCINE) 25000 UT/250ML-% IV SOLN
1250.0000 [IU]/h | INTRAVENOUS | Status: DC
  Administered 2022-03-27 (×2): 1100 [IU]/h via INTRAVENOUS
  Administered 2022-03-28 – 2022-03-29 (×2): 1250 [IU]/h via INTRAVENOUS
  Filled 2022-03-27 (×4): qty 250

## 2022-03-27 MED ORDER — HALOPERIDOL LACTATE 5 MG/ML IJ SOLN
5.0000 mg | Freq: Four times a day (QID) | INTRAMUSCULAR | Status: AC | PRN
  Administered 2022-03-27 – 2022-03-28 (×3): 5 mg via INTRAMUSCULAR
  Filled 2022-03-27 (×4): qty 1

## 2022-03-27 MED ORDER — LORAZEPAM 2 MG/ML IJ SOLN
0.5000 mg | INTRAMUSCULAR | Status: DC | PRN
Start: 2022-03-27 — End: 2022-03-28
  Administered 2022-03-27: 0.5 mg via INTRAVENOUS
  Filled 2022-03-27 (×2): qty 1

## 2022-03-27 MED ORDER — EZETIMIBE 10 MG PO TABS
10.0000 mg | ORAL_TABLET | Freq: Every day | ORAL | Status: DC
  Administered 2022-03-27: 10 mg via ORAL
  Filled 2022-03-27: qty 1

## 2022-03-27 MED ORDER — ASPIRIN 81 MG PO TBEC: 81.0000 mg | DELAYED_RELEASE_TABLET | Freq: Every day | ORAL | Status: DC

## 2022-03-27 MED ORDER — AMLODIPINE BESYLATE 5 MG PO TABS
2.5000 mg | ORAL_TABLET | Freq: Every day | ORAL | Status: DC
  Administered 2022-03-27: 2.5 mg via ORAL
  Filled 2022-03-27: qty 1

## 2022-03-27 NOTE — Assessment & Plan Note (Signed)
-  h/o stent in 2022 -Hold Plavix, currently on heparin

## 2022-03-27 NOTE — ED Notes (Signed)
Pt sitting up in recliner, RN at bedside with pt. Pt resting with eyes closed, rise and fall of the pt chest noted. Plan of care continues.

## 2022-03-27 NOTE — ED Notes (Addendum)
RN walked by pt room and found pt to not be in room, IV ripped out. RN found tech walking with pt down the hallway towards the ED doors by CT. Pt was trying to leave stating "I want to see my son." RN stated that we can call his son. Pt stated "Y'all are holding me hostage." Pt told other RN "This is war and it's about to get really bad." Pt then tackled this RN through ED doors. Security present. RN and Consulting civil engineer trying to de-escalate the situation with pt stating he is at the hospital for a possible heart attack. Pt agreeable and walked back to room with RN and security. Security at bedside, Cardiology notified.
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
## 2022-03-27 NOTE — ED Notes (Signed)
Patient was trying to get out of bed. Patient began ripping off cords and stating his legs were shaking. Nurse staff assisted patient back to bed. Medications were given for pain in his legs.

## 2022-03-27 NOTE — Telephone Encounter (Signed)
Disregard: I was going to call for TCM, but patient has been re-admitted to hospital. I will call when he is d/c

## 2022-03-27 NOTE — Assessment & Plan Note (Signed)
-  On Coumadin chronically, but this on hold currently and he is on heparin infusion

## 2022-03-27 NOTE — ED Notes (Signed)
Patient is attempting to leave. When redirected back to the bed patient became combative squeezing the nurse techs arms and not moving back to his bed. Nursing staff assisted the patient back to the bed. We explained why he  needed to stay in bed. Medication was given.

## 2022-03-27 NOTE — ED Notes (Signed)
Patient is shaking his whole. Patient tried to remove cords several times, this NT redirected the patient with no issues. The patient then decided to rip off all cords stating "they told him to", and he got up, a nurse came to help me and he started to get aggressive. Charge, this patients nurse, and Ken NT assisted me with getting the patient back in bed and hooked up. Patient is still shaking.

## 2022-03-27 NOTE — Assessment & Plan Note (Addendum)
-  Rate controlled without medication -Has pacemaker -Currently on heparin (instead of Coumadin)

## 2022-03-27 NOTE — ED Notes (Signed)
Pt ambulated to recliner. Pt sitting up and resting comfortably. States theres a cat in his room. Pt noticeably twitching but stats "I am okay and no I don't need anything right now."

## 2022-03-27 NOTE — Assessment & Plan Note (Signed)
-  Continue synthroid -Recent normal TSH

## 2022-03-27 NOTE — Assessment & Plan Note (Signed)
-   Continue amlodipine ?

## 2022-03-27 NOTE — Assessment & Plan Note (Signed)
-  He had similar behavioral issues during his last hospitalization and psychiatry was consulted -He was recommended for Seroquel but apparently had worsening RLS (known issue) and so this appears to have been stopped -Psychiatry was reconsulted today and patient does not have capacity to refuse care -IVC paperwork was completed by EDP -Wellbutrin from home appears to have been stopped by psych -He was started on Depakote for mood stabliization -Continue trazodone, sertraline -Delirium precautions -Haldol, Ativan prn agitation - neither is ideal in this circumstance, unfortuantely

## 2022-03-27 NOTE — ED Notes (Signed)
Pt sitting up in recliner eating dinner tray.

## 2022-03-27 NOTE — ED Notes (Signed)
Cardiology at bedside. Pt took off down the hallway stating he is going to the bathroom. Pt then walked past room 26 and tried to leave through yellow zone. Pt was stopped and then tried to place his hands on securitys throat. Pt walked back to room.

## 2022-03-27 NOTE — ED Notes (Signed)
Pt compliant with taking medication. RN started new IV and restarted heparin. Pharmacy updated regarding heparin being paused from 0900-1145 today and that level might be off. Pt agreeable to have cardiac monitor on. Plan of care continues.
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
## 2022-03-27 NOTE — Assessment & Plan Note (Signed)
-  No longer on insulin or other medications -Recent A1c was 6.6 -Will cover with sensitive-scale SSI

## 2022-03-27 NOTE — Assessment & Plan Note (Signed)
-  Avoid nephrotoxic medications, where possible -Appears to be stable at this time
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
## 2022-03-27 NOTE — ED Notes (Signed)
Assisted patient to the bathroom. Patient is pleasant and redirectable.

## 2022-03-27 NOTE — Assessment & Plan Note (Addendum)
-  Patient with recent admission for pacemaker generator change now presenting with NSTEMI -He had marked behavioral issues during last hospitalization but was ultimately sent home -He apparently called 911 overnight with CP; he now denies this but does acknowledge current CP -Markedly elevated troponin -He was admitted by cardiology for catheterization but based on behaviors (aggressive, declining care, etc) cardiology asked [facility] to assume care -He has marked dementia/delirium and does not have capacity to make decisions (per psychiatry) -Will need eventual cath assuming his son Avon Gully) continues to agree to this -Cardiology is following but [facility] will become primary
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
## 2022-03-27 NOTE — ED Notes (Signed)
Pt son left, RN at bedside sitting with pt. Pt Alert and oriented to himself and where he is at. Pt unable to recall what year.

## 2022-03-27 NOTE — ED Notes (Signed)
Pt rolling in bed stating he is seeing creatures. Pt stated to nurse that there were 3 of them when there was nothing there

## 2022-03-27 NOTE — ED Notes (Signed)
Family at bedside. 

## 2022-03-27 NOTE — ED Notes (Signed)
This NT took over as Recruitment consultant for patient.

## 2022-03-27 NOTE — ED Notes (Signed)
Pt continuing to try to get out of bed, pt educated that he  is on a blood thinner medication and should not get up without assistance due to the risk for bleeding pt continues to attempt to get up
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
## 2022-03-27 NOTE — ED Notes (Signed)
Pt's son called and updated

## 2022-03-27 NOTE — ED Provider Notes (Signed)
[facility] EMERGENCY DEPARTMENT Provider Note   CSN: [id number] Arrival date & time: 28-Mar-2022  2238     History  Chief Complaint  Patient presents with   Chest Pain    [name] is a 71 y.o. male.  Patient presents to the emergency department for evaluation of chest pain.  Patient developed substernal chest pain earlier tonight.  At arrival to the emergency department, patient continues to have pain in the center of his chest.  Denies shortness of breath.       Home Medications Prior to Admission medications   Medication Sig Start Date End Date Taking? Authorizing Provider  acetaminophen (TYLENOL) 500 MG tablet Take 2 tablets (1,000 mg total) by mouth every 6 (six) hours. Patient taking differently: Take 1,000 mg by mouth every 6 (six) hours as needed (pain.). 03/04/20   Meuth, Brooke A, PA-C  amLODipine (NORVASC) 5 MG tablet Take 0.5 tablets (2.5 mg total) by mouth daily. Patient taking differently: Take 2.5 mg by mouth in the morning and at bedtime. 01/18/22 01/13/23  Bennie Pierini, FNP  buPROPion (WELLBUTRIN XL) 150 MG 24 hr tablet TAKE 1 TABLET BY MOUTH EVERY DAY 02/23/22   Daphine Deutscher, [name], FNP  carboxymethylcellulose (REFRESH PLUS) 0.5 % SOLN INSTILL 1 DROP IN BOTH EYES FOUR TIMES A DAY AS NEEDED 03/31/21   [provider]  clopidogrel (PLAVIX) 75 MG tablet Take 1 tablet (75 mg total) by mouth daily with breakfast. March 28, 2022   Meredeth Ide, MD  furosemide (LASIX) 20 MG tablet Take 1 tablet (20 mg total) by mouth daily. 03/25/22 07/23/22  Meredeth Ide, MD  lactase (LACTAID) 3000 units tablet Take 9,000 Units by mouth daily as needed (when consuming dairy products).    [provider]  levothyroxine (SYNTHROID) 112 MCG tablet Take 1 tablet (112 mcg total) by mouth daily. 10/21/21   Daphine Deutscher [name], FNP  Multiple Vitamin (MULTIVITAMIN WITH MINERALS) TABS tablet Take 1 tablet by mouth daily.    [provider]   nitroGLYCERIN (NITROSTAT) 0.4 MG SL tablet Place 1 tablet (0.4 mg total) under the tongue every 5 (five) minutes as needed for chest pain (Do not give more than 3 SL tablets in 15 minutes.). 12/07/20   Rai, Delene Ruffini, MD  Oxycodone HCl 10 MG TABS Take 10 mg by mouth every 4 (four) hours as needed for pain. 03/01/22   [provider]  polyethylene glycol (MIRALAX / GLYCOLAX) 17 g packet Take 17 g by mouth daily as needed for mild constipation. 03/04/20   Meuth, Brooke A, PA-C  senna-docusate (SENOKOT-S) 8.6-50 MG tablet Take 1 tablet by mouth daily.    [provider]  sertraline (ZOLOFT) 100 MG tablet Take 1 tablet (100 mg total) by mouth daily. 02/23/22   Daphine Deutscher [name], FNP  traZODone (DESYREL) 100 MG tablet Take 1 tablet (100 mg total) by mouth at bedtime. Patient taking differently: Take 100 mg by mouth at bedtime as needed for sleep. 01/18/22   Daphine Deutscher, [name], FNP  VITAMIN D PO Take 1 tablet by mouth daily.    [provider]  warfarin (COUMADIN) 5 MG tablet Take 1 tablet (5 mg total) by mouth daily. 01/18/22   Bennie Pierini, FNP      Allergies    Apresoline [hydralazine], Crestor [rosuvastatin], Lipitor [atorvastatin], Praluent [alirocumab], Ace inhibitors, Benadryl [diphenhydramine hcl], Diovan [valsartan], Imdur [isosorbide dinitrate], Lopressor [metoprolol], and Nsaids    Review of Systems   Review of Systems  Physical Exam Updated Vital  Signs BP (!) 164/68   Pulse 71   Temp 98.3 F (36.8 C) (Oral)   Resp 20   Ht 5\' 6"  (1.676 m)   Wt 89.1 kg   SpO2 100%   BMI 31.70 kg/m  Physical Exam Vitals and nursing note reviewed.  Constitutional:      General: He is not in acute distress.    Appearance: He is well-developed.  HENT:     Head: Normocephalic and atraumatic.     Mouth/Throat:     Mouth: Mucous membranes are moist.  Eyes:     General: Vision grossly intact. Gaze aligned appropriately.     Extraocular Movements: Extraocular  movements intact.     Conjunctiva/sclera: Conjunctivae normal.  Cardiovascular:     Rate and Rhythm: Normal rate and regular rhythm.     Pulses: Normal pulses.     Heart sounds: Normal heart sounds, S1 normal and S2 normal. No murmur heard.    No friction rub. No gallop.  Pulmonary:     Effort: Pulmonary effort is normal. No respiratory distress.     Breath sounds: Normal breath sounds.  Abdominal:     Palpations: Abdomen is soft.     Tenderness: There is no abdominal tenderness. There is no guarding or rebound.     Hernia: No hernia is present.  Musculoskeletal:        General: No swelling.     Cervical back: Full passive range of motion without pain, normal range of motion and neck supple. No pain with movement, spinous process tenderness or muscular tenderness. Normal range of motion.     Right lower leg: No edema.     Left lower leg: No edema.  Skin:    General: Skin is warm and dry.     Capillary Refill: Capillary refill takes less than 2 seconds.     Findings: No ecchymosis, erythema, lesion or wound.     Comments: Pacemaker placement site right upper chest clean and dry  Neurological:     Mental Status: He is alert and oriented to person, place, and time.     GCS: GCS eye subscore is 4. GCS verbal subscore is 5. GCS motor subscore is 6.     Cranial Nerves: Cranial nerves 2-12 are intact.     Sensory: Sensation is intact.     Motor: Motor function is intact. No weakness or abnormal muscle tone.     Coordination: Coordination is intact.  Psychiatric:        Mood and Affect: Mood normal.        Speech: Speech normal.        Behavior: Behavior normal.     ED Results / Procedures / Treatments   Labs (all labs ordered are listed, but only abnormal results are displayed) Labs Reviewed  CBC WITH DIFFERENTIAL/PLATELET - Abnormal; Notable for the following components:      Result Value   RBC 4.14 (*)    Hemoglobin 12.2 (*)    HCT 36.8 (*)    All other components within  normal limits  COMPREHENSIVE METABOLIC PANEL - Abnormal; Notable for the following components:   Glucose, Bld 131 (*)    BUN 27 (*)    Creatinine, Ser 1.96 (*)    GFR, Estimated 36 (*)    All other components within normal limits  PROTIME-INR - Abnormal; Notable for the following components:   Prothrombin Time 17.5 (*)    INR 1.5 (*)    All other components within  normal limits  TROPONIN I (HIGH SENSITIVITY) - Abnormal; Notable for the following components:   Troponin I (High Sensitivity) 452 (*)    All other components within normal limits  TROPONIN I (HIGH SENSITIVITY) - Abnormal; Notable for the following components:   Troponin I (High Sensitivity) 447 (*)    All other components within normal limits    EKG EKG Interpretation  Date/Time:  Sunday March 29, 2022 22:49:39 EDT Ventricular Rate:  65 PR Interval:  228 QRS Duration: 200 QT Interval:  478 QTC Calculation: 498 R Axis:   -87 Text Interpretation: Atrial-sensed ventricular-paced rhythm No further analysis attempted due to paced rhythm Confirmed by Gilda Crease ([id number]) on Mar 29, 2022 11:04:29 PM  Radiology DG Chest Port 1 View  Result Date: March 29, 2022 CLINICAL DATA:  Chest pain. EXAM: PORTABLE CHEST 1 VIEW COMPARISON:  March 17, 2022 FINDINGS: There is a multi lead AICD. Multiple sternal wires are noted. The heart size and mediastinal contours are within normal limits. There is moderate severity calcification of the aortic arch. An artificial aortic valve is noted. Both lungs are clear. Multilevel degenerative changes are seen throughout the thoracic spine. IMPRESSION: 1. Prior median sternotomy/CABG. 2. No acute or active cardiopulmonary disease. Electronically Signed   By: Aram Candela M.D.   On: 03/29/22 23:19    Procedures Procedures    Medications Ordered in ED Medications - No data to display  ED Course/ Medical Decision Making/ A&P                           Medical Decision Making Amount and/or  Complexity of Data Reviewed Independent Historian: EMS External Data Reviewed: labs, ECG and notes. Labs: ordered. Decision-making details documented in ED Course. Radiology: ordered and independent interpretation performed. Decision-making details documented in ED Course. ECG/medicine tests: ordered and independent interpretation performed. Decision-making details documented in ED Course.   Patient presents to the emergency department for evaluation of chest pain.  Record review reveals that the patient had a pacemaker replaced 7 days ago.  His pacemaker was originally placed for complete heart block.  Records also indicate that he has a history of coronary artery disease, possible stenting in 2022 and has multiple residual blockages.  Pain today was substernal in nature and does seem consistent with cardiac chest pain.  EKG unchanged.  Troponins elevated.  INR subtherapeutic.  Initiate heparin for NSTEMI, cardiology to see the patient for admission.  CRITICAL CARE Performed by: Gilda Crease   Total critical care time: 30 minutes  Critical care time was exclusive of separately billable procedures and treating other patients.  Critical care was necessary to treat or prevent imminent or life-threatening deterioration.  Critical care was time spent personally by me on the following activities: development of treatment plan with patient and/or surrogate as well as nursing, discussions with consultants, evaluation of patient's response to treatment, examination of patient, obtaining history from patient or surrogate, ordering and performing treatments and interventions, ordering and review of laboratory studies, ordering and review of radiographic studies, pulse oximetry and re-evaluation of patient's condition.         Final Clinical Impression(s) / ED Diagnoses Final diagnoses:  NSTEMI (non-ST elevated myocardial infarction) [facility])    Rx / DC Orders ED Discharge Orders      None         Nathalee Smarr, Canary Brim, MD 03/27/22 0321

## 2022-03-27 NOTE — Assessment & Plan Note (Signed)
-  Apparently worsened with Seroquel -On Oxy prn pain -Will attempt to avoid medications that exacerbate this (like Haldol) but it may be negatively affected by his cognitive impairment as well
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
## 2022-03-27 NOTE — ED Provider Notes (Signed)
  Physical Exam  BP 129/63 (BP Location: Right Arm)   Pulse 67   Temp 97.8 F (36.6 C) (Oral)   Resp (!) 28   Ht 5\' 6"  (1.676 m)   Wt 89.1 kg   SpO2 100%   BMI 31.70 kg/m   Physical Exam  Procedures  Procedures  ED Course / MDM    Medical Decision Making Amount and/or Complexity of Data Reviewed Labs: ordered.  Risk Prescription drug management. Decision regarding hospitalization.   Patient had become combative.  Choked staff.  Cardiology discussed with hospitalist who transferred admission to her service.  They got capacity evaluation by psychiatry.  Psychiatry states he does not have the capacity to refuse and would require IVC.  However apparently psychiatry and hospitalist both cannot do IVC so I was asked to IVC the patient.  He has been combative and confused.  Paperwork has been filled out.       , MD 03/27/22 1258

## 2022-03-28 ENCOUNTER — Inpatient Hospital Stay (HOSPITAL_COMMUNITY): Payer: Medicare HMO

## 2022-03-28 DIAGNOSIS — J189 Pneumonia, unspecified organism: Secondary | ICD-10-CM | POA: Diagnosis not present

## 2022-03-28 DIAGNOSIS — Z952 Presence of prosthetic heart valve: Secondary | ICD-10-CM

## 2022-03-28 DIAGNOSIS — R69 Illness, unspecified: Secondary | ICD-10-CM | POA: Diagnosis not present

## 2022-03-28 DIAGNOSIS — N1832 Chronic kidney disease, stage 3b: Secondary | ICD-10-CM

## 2022-03-28 DIAGNOSIS — J9601 Acute respiratory failure with hypoxia: Secondary | ICD-10-CM | POA: Diagnosis not present

## 2022-03-28 DIAGNOSIS — F03918 Unspecified dementia, unspecified severity, with other behavioral disturbance: Secondary | ICD-10-CM | POA: Diagnosis not present

## 2022-03-28 DIAGNOSIS — I214 Non-ST elevation (NSTEMI) myocardial infarction: Secondary | ICD-10-CM | POA: Diagnosis not present

## 2022-03-28 DIAGNOSIS — I248 Other forms of acute ischemic heart disease: Secondary | ICD-10-CM | POA: Diagnosis not present

## 2022-03-28 DIAGNOSIS — F05 Delirium due to known physiological condition: Secondary | ICD-10-CM

## 2022-03-28 LAB — GLUCOSE, CAPILLARY
Glucose-Capillary: 194 mg/dL — ABNORMAL HIGH (ref 70–99)
Glucose-Capillary: 170 mg/dL — ABNORMAL HIGH (ref 70–99)
Glucose-Capillary: 114 mg/dL — ABNORMAL HIGH (ref 70–99)

## 2022-03-28 LAB — BASIC METABOLIC PANEL
Anion gap: 7 (ref 5–15)
BUN: 28 mg/dL — ABNORMAL HIGH (ref 8–23)
CO2: 29 mmol/L (ref 22–32)
Calcium: 9 mg/dL (ref 8.9–10.3)
Chloride: 103 mmol/L (ref 98–111)
Creatinine, Ser: 1.86 mg/dL — ABNORMAL HIGH (ref 0.61–1.24)
GFR, Estimated: 38 mL/min — ABNORMAL LOW (ref >60)
Glucose, Bld: 129 mg/dL — ABNORMAL HIGH (ref 70–99)
Potassium: 4.2 mmol/L (ref 3.5–5.1)
Sodium: 139 mmol/L (ref 135–145)

## 2022-03-28 LAB — URINALYSIS, ROUTINE W REFLEX MICROSCOPIC
Bilirubin Urine: NEGATIVE
Glucose, UA: NEGATIVE mg/dL
Hgb urine dipstick: NEGATIVE
Ketones, ur: 5 mg/dL — AB
Leukocytes,Ua: NEGATIVE
Nitrite: NEGATIVE
Protein, ur: NEGATIVE mg/dL
Specific Gravity, Urine: 1.016 (ref 1.005–1.030)
pH: 5 (ref 5.0–8.0)

## 2022-03-28 LAB — CBC
HCT: 38.3 % — ABNORMAL LOW (ref 39.0–52.0)
Hemoglobin: 12.6 g/dL — ABNORMAL LOW (ref 13.0–17.0)
MCH: 29.8 pg (ref 26.0–34.0)
MCHC: 32.9 g/dL (ref 30.0–36.0)
MCV: 90.5 fL (ref 80.0–100.0)
Platelets: 155 10*3/uL (ref 150–400)
RBC: 4.23 MIL/uL (ref 4.22–5.81)
RDW: 15 % (ref 11.5–15.5)
WBC: 11.3 10*3/uL — ABNORMAL HIGH (ref 4.0–10.5)
nRBC: 0 % (ref 0.0–0.2)

## 2022-03-28 LAB — PROTIME-INR
INR: 1.6 — ABNORMAL HIGH (ref 0.8–1.2)
Prothrombin Time: 18.5 seconds — ABNORMAL HIGH (ref 11.4–15.2)

## 2022-03-28 LAB — HEPARIN LEVEL (UNFRACTIONATED)
Heparin Unfractionated: 0.4 IU/mL (ref 0.30–0.70)
Heparin Unfractionated: 0.21 IU/mL — ABNORMAL LOW (ref 0.30–0.70)
Heparin Unfractionated: 0.4 IU/mL (ref 0.30–0.70)

## 2022-03-28 LAB — LIPOPROTEIN A (LPA): Lipoprotein (a): 198.7 nmol/L — ABNORMAL HIGH (ref <75.0)

## 2022-03-28 LAB — ECHOCARDIOGRAM COMPLETE
AR max vel: 1.4 cm2
AV Area VTI: 1.42 cm2
AV Area mean vel: 1.23 cm2
AV Mean grad: 14 mmHg
AV Peak grad: 21.5 mmHg
Ao pk vel: 2.32 m/s
Area-P 1/2: 3.06 cm2
Height: 66 in
S' Lateral: 3.5 cm
Weight: 3142.88 oz

## 2022-03-28 MED ORDER — PERFLUTREN LIPID MICROSPHERE
1.0000 mL | INTRAVENOUS | Status: AC | PRN
  Administered 2022-03-28: 4 mL via INTRAVENOUS

## 2022-03-28 MED ORDER — THIAMINE HCL 100 MG/ML IJ SOLN
100.0000 mg | Freq: Every day | INTRAMUSCULAR | Status: DC
  Administered 2022-03-28 – 2022-03-29 (×2): 100 mg via INTRAVENOUS
  Filled 2022-03-28 (×2): qty 2

## 2022-03-28 MED ORDER — SODIUM CHLORIDE 0.9 % IV SOLN
3.0000 g | Freq: Three times a day (TID) | INTRAVENOUS | Status: DC
  Administered 2022-03-28 – 2022-03-29 (×5): 3 g via INTRAVENOUS
  Filled 2022-03-28 (×9): qty 8

## 2022-03-28 MED ORDER — HALOPERIDOL 5 MG PO TABS
5.0000 mg | ORAL_TABLET | Freq: Once | ORAL | Status: AC
  Filled 2022-03-28: qty 1

## 2022-03-28 MED ORDER — ACETAMINOPHEN 650 MG RE SUPP
650.0000 mg | Freq: Once | RECTAL | Status: AC
Start: 2022-03-28 — End: 2022-03-28
  Administered 2022-03-28: 650 mg via RECTAL
  Filled 2022-03-28: qty 1

## 2022-03-28 MED ORDER — LORAZEPAM 2 MG/ML IJ SOLN
0.0000 mg | INTRAMUSCULAR | Status: DC | PRN
  Administered 2022-03-28 – 2022-03-29 (×10): 1 mg via INTRAVENOUS
  Administered 2022-03-29: 0.5 mg via INTRAVENOUS
  Filled 2022-03-28 (×13): qty 1

## 2022-03-28 MED ORDER — HALOPERIDOL LACTATE 5 MG/ML IJ SOLN
5.0000 mg | Freq: Once | INTRAMUSCULAR | Status: AC
  Administered 2022-03-28: 5 mg via INTRAMUSCULAR
  Filled 2022-03-28: qty 1
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
## 2022-03-28 NOTE — Progress Notes (Signed)
ANTICOAGULATION CONSULT NOTE - Follow Up Consult  Pharmacy Consult for heparin Indication:  NSTEMI and Afib/AVR  Labs: Recent Labs    03/27/22 0104 03/27/22 0210 03/27/22 1144 03/28/22 0608  HGB 12.2*  --   --  12.6*  HCT 36.8*  --   --  38.3*  PLT 162  --   --  155  LABPROT 17.5*  --   --  18.5*  INR 1.5*  --   --  1.6*  HEPARINUNFRC  --   --  <0.10* 0.40  CREATININE 1.96*  --   --   --   TROPONINIHS 452* 447*  --   --     Assessment/Plan:  71yo male therapeutic on heparin with initial dosing for NSTEMI/Afib/AVR. Will continue infusion at current rate of 1100 units/hr and confirm stable with additional level.   Vernard Gambles, PharmD, BCPS  03/28/2022,6:40 AM

## 2022-03-28 NOTE — Progress Notes (Addendum)
[facility]  PROGRESS NOTE  Andre Malone Andre Malone DOB: Jul 04, 1951 DOA: 04/16/2022 PCP: Andre Pierini, FNP   Brief HPI:    71 year old male with multiple comorbidities including Alzheimer's dementia, CAD s/p PCI, PAF, aortic stenosis s/p AVR, complete heart block s/p PPM, s/p recent generator exchange 7/23, chronic diastolic heart failure, hypertension, diabetes mellitus type 2, CKD stage IIIb, RLS who presented to the ED with chest pain and was diagnosed with NSTEMI.  Patient was recently hospitalized for generator exchange at that time hospital course was complicated by altered mental status.  Neurology was consulted at that time it was deemed that patient has dementia.  Psychiatry was consulted for agitation, patient started on Seroquel however Seroquel was stopped due to concern for worsening restless leg syndrome.  Patient was discharged home, however patient came back with chest pain.  Got agitated in the ED, psychiatry was consulted, deemed patient has no capacity and he was Andre Malone.  Wellbutrin was discontinued which patient was taking at home.  Patient started on Depakote.  He has been very somnolent since starting Depakote.  Patient became hypoxemic last night so PCCM was consulted. TRH was consulted for worsening delirium    Subjective   Patient seen and examined, somnolent after getting Depakote.   Assessment/Plan:    Senile dementia, delirium with behavioral disturbance -Patient had similar delirium during previous hospitalization -At that time he was started on Seroquel which was discontinued due to worsening RLS -Psychiatry was reconsulted and deemed that patient has no capacity, IVC paperwork was completed in the ED -Wellbutrin has been stopped by psych -Patient started on Depakote for mood stabilization -Continue trazodone, sertraline -We will give Haldol 5 mg p.o./IM as needed for agitation  Acute hypoxemic respiratory failure -Likely from  aspiration pneumonia -PCCM was consulted -Patient was started on Unasyn -  Restless leg syndrome -Likely got worse from Seroquel -We will try to avoid Haldol however patient was agitated so had to be given Haldol  NSTEMI -Patient presented with chest pain -Patient has markedly elevated troponin -Will eventually need cardiac catheterization -Continue aspirin, Plavix, Zetia  Hypertension -Continue amlodipine  S/p AVR -Patient on Coumadin chronically -Coumadin on hold, patient on heparin infusion  Paroxysmal atrial fibrillation -Heart rate controlled -Patient has pacemaker  CKD stage IIIb -Creatinine stable  Diabetes mellitus type 2 -Recent hemoglobin A1c was 6.6 -Continue sliding scale insulin NovoLog  Hypothyroidism -Continue Synthroid -TSH was 2.565 on 03/21/22    Medications     amLODipine  2.5 mg Oral Daily   aspirin EC  81 mg Oral Daily   clopidogrel  75 mg Oral Q breakfast   divalproex  125 mg Oral Q8H   ezetimibe  10 mg Oral Daily   furosemide  20 mg Oral Daily   insulin aspart  0-9 Units Subcutaneous TID WC   levothyroxine  112 mcg Oral Daily   senna-docusate  1 tablet Oral Daily   sertraline  100 mg Oral Daily   thiamine injection  100 mg Intravenous Daily   traZODone  100 mg Oral QHS     Data Reviewed:   CBG:  Recent Labs  Lab 03/28/22 1423  GLUCAP 194*    SpO2: 94 % O2 Flow Rate (L/min): 15 L/min FiO2 (%): 100 %    Vitals:   03/28/22 1000 03/28/22 1200 03/28/22 1547 03/28/22 1601  BP: 109/63 129/63 132/66 119/83  Pulse: 60 78 (!) 105 (!) 101  Resp: 20 20    Temp: 98.8 F (37.1 C)  99.4 F (37.4 C) 99.1 F (37.3 C)   TempSrc: Core Core Axillary   SpO2: 100%  92% 94%  Weight:      Height:          Data Reviewed:  Basic Metabolic Panel: Recent Labs  Lab 03/23/22 0110 03/27/22 0104 03/28/22 0608  NA 137 137 139  K 3.3* 3.7 4.2  CL 99 103 103  CO2 25 27 29   GLUCOSE 160* 131* 129*  BUN 29* 27* 28*  CREATININE 2.03*  1.96* 1.86*  CALCIUM 8.6* 9.6 9.0    CBC: Recent Labs  Lab 03/24/22 0116 03/27/22 0104 03/28/22 0608  WBC 6.6 7.6 11.3*  NEUTROABS  --  5.7  --   HGB 12.3* 12.2* 12.6*  HCT 37.5* 36.8* 38.3*  MCV 88.9 88.9 90.5  PLT 144* 162 155    LFT Recent Labs  Lab 03/27/22 0104  AST 34  ALT 17  ALKPHOS 68  BILITOT 0.7  PROT 7.1  ALBUMIN 3.9     Antibiotics: Anti-infectives (From admission, onward)    Start     Dose/Rate Route Frequency Ordered Stop   03/28/22 1345  Ampicillin-Sulbactam (UNASYN) 3 g in sodium chloride 0.9 % 100 mL IVPB        3 g 200 mL/hr over 30 Minutes Intravenous Every 8 hours 03/28/22 1258            Objective    Physical Examination:   General: Somnolent Cardiovascular: S1-S2, regular, no murmur auscultated Respiratory: Decreased breath sound at lung bases Abdomen: Soft, nontender, no organomegaly Extremities: No edema in the lower extremities Neurologic: somnolent, not following commands   Status is: Inpatient: Acute hypoxemic respiratory failure, delirium        Andre Malone S Andre Malone   [facility] If 7PM-7AM, please contact night-coverage at [email or web address], Office  [phone]   03/28/2022, 4:46 PM  LOS: 1 day

## 2022-03-28 NOTE — Significant Event (Signed)
Patient's nurse notified me that patient was getting increasingly agitated despite patient receiving Haldol and Ativan at 8:15 PM.  Another dose of Ativan was given at 11:15 PM.  I ordered Haldol 2.5 mg IV despite which patient was still agitated and reviewed patient's other medications also.  Patient's labs and also notes.  Patient is presently on 100% nonrebreather but not in respiratory distress.  Patient is agitated but oriented to the place and name.  We will get chest x-ray when patient is more calm.  I have consulted pulmonary critical care given the on going agitation and hypoxic respiratory failure.  Midge Minium

## 2022-03-28 NOTE — Progress Notes (Signed)
Pharmacy Antibiotic Note  Andre Malone is a 71 y.o. male admitted on 03/19/2022 with multifocal pneumonia.  Pharmacy has been consulted for Unasyn dosing. He is afebrile, WBC are elevated at 11.3, and SCr down to 1.86 (baseline ~1.4-1.8). CXR shows a multifocal pneumonia (R > L).   Plan: Unasyn 3 g IV q8h Monitor renal function, clinical progress, cultures/sensitivities F/U LOT and de-escalate as able   Height: 5\' 6"  (167.6 cm) Weight: 89.1 kg (196 lb 6.9 oz) IBW/kg (Calculated) : 63.8  Temp (24hrs), Avg:98.6 F (37 C), Min:98 F (36.7 C), Max:99.4 F (37.4 C)  Recent Labs  Lab 03/23/22 0110 03/24/22 0116 03/27/22 0104 03/28/22 0608  WBC  --  6.6 7.6 11.3*  CREATININE 2.03*  --  1.96* 1.86*    Estimated Creatinine Clearance: 38.6 mL/min (A) (by C-G formula based on SCr of 1.86 mg/dL (H)).    Allergies  Allergen Reactions   Apresoline [Hydralazine] Nausea And Vomiting   Crestor [Rosuvastatin] Other (See Comments)    Myalgias on 5-40mg  daily dosing   Lipitor [Atorvastatin] Other (See Comments)    achiness   Praluent [Alirocumab] Other (See Comments)    fatigue   Ace Inhibitors Other (See Comments)    cough   Benadryl [Diphenhydramine Hcl] Other (See Comments)    Makes restless legs worse   Diovan [Valsartan] Other (See Comments)    shuts down Kidney   Imdur [Isosorbide Dinitrate] Other (See Comments)    headache   Lopressor [Metoprolol] Other (See Comments)    Kidney failure   Nsaids Other (See Comments)    REACTION: Currently taking Coumadin    Antimicrobials this admission: Unasyn 7/25 >>   Dose adjustments this admission:   Microbiology results: 7/17 MRSA PCR neg, Staph PCR positive  Thank you for involving pharmacy in this patient's care.  8/17, PharmD, BCPS Clinical Pharmacist Clinical phone for 03/28/2022 until 3p is [phone] 03/28/2022 12:56 PM

## 2022-03-28 NOTE — TOC Initial Note (Addendum)
Transition of Care [facility] - Initial/Assessment Note    Patient Details  Name: Andre Malone MRN: [id number] Date of Birth: [birth year]  Transition of Care [facility] 2) CM/SW Contact:    Eduard Roux, LCSW Phone Number: 03/28/2022, 3:25 PM  Clinical Narrative:                  CSW spoke with patient's son, by phone. CSW introduced self and explained role. Patient's son, Molli Hazard states the patient was recently discharged on 03/24/22, home w/him and his wife. However, the patient resides at [facility]. Family reports they have been working with [employer] and  [facility] to find ALF that can meet the patient's needs. They are hopeful that the patient can discharge from hospital to ALF. CSW explained, transitioning to ALF form hospital is lengthy process and patient maybe medically stable before ALF is in place. He expressed concerns about patient's behavior and the frequency in is readmission.   CSW inquired about Medicaid application- family states not sure of the status. CSW will follow up on status.  Patient IVC 03/27/2022- it will expire on 04/02/2022  [facility] will continue to follow and assist with discharge planning.   Antony Blackbird, MSW, LCSW Clinical Social Worker       Barriers to Discharge: Continued Medical Work up   Patient Goals and CMS Choice        Expected Discharge Plan and Services   In-house Referral: Clinical Social Work     Living arrangements for the past 2 months: Independent Living Facility                                      Prior Living Arrangements/Services Living arrangements for the past 2 months: Independent Living Facility Lives with:: Self Patient language and need for interpreter reviewed:: No        Need for Family Participation in Patient Care: Yes (Comment) Care giver support system in place?: Yes (comment)   Criminal Activity/Legal Involvement Pertinent to Current  Situation/Hospitalization: No - Comment as needed  Activities of Daily Living      Permission Sought/Granted                  Emotional Assessment       Orientation: : Oriented to Self, Oriented to Place Alcohol / Substance Use: Not Applicable Psych Involvement: No (comment)  Admission diagnosis:  NSTEMI (non-ST elevated myocardial infarction) (HCC) [I21.4] Patient Active Problem List   Diagnosis Date Noted   Senile dementia, delirium, with behavioral disturbance (HCC) 03/20/2022   Age-related nuclear cataract, bilateral 07/04/2021   Amblyopia of right eye 07/04/2021   Cervical radiculopathy 07/04/2021   Hyperlipidemia associated with type 2 diabetes mellitus (HCC) 07/04/2021   Major depressive disorder, recurrent, unspecified (HCC) 07/04/2021   Morbid obesity (HCC) 07/04/2021   Opioid type dependence, abuse (HCC) 07/04/2021   Shoulder pain 07/04/2021   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, morgagnian type, bilateral 07/01/2021   Long-term current use of opiate analgesic 07/01/2021   SDH (subdural hematoma) (HCC) 02/20/2020   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) 12/25/2019   Complicated bereavement 12/25/2019   Low back pain 12/18/2019   Acute on chronic systolic heart failure (HCC) 08/11/2017   Pacemaker lead failure 08/11/2017   Chronic anticoagulation 08/11/2017   Primary insomnia 05/10/2017   Recurrent major depressive disorder, in full remission (HCC) 05/10/2017   RLS (restless  legs syndrome) 02/04/2016   Vitamin D deficiency 12/21/2015   Atrioventricular block, complete (HCC) 09/17/2013   Pacemaker  [employer]es    S/P AVR (aortic valve replacement) 08/22/2013   Essential hypertension 08/22/2013   NSTEMI (non-ST elevated myocardial infarction) (HCC) 05/29/2013   Paroxysmal atrial fibrillation (HCC) 11/20/2012   CAD, NATIVE VESSEL 12/28/2008   Hypothyroidism 12/23/2008   Type 2 diabetes mellitus with diabetic neuropathy, with long-term current use of insulin (HCC)  12/23/2008   BLINDNESS, RIGHT EYE 12/23/2008   Aortic valve disorder 12/23/2008   Thoracic aortic aneurysm (TAA) (HCC) 12/23/2008   Chronic kidney disease, stage 3b (HCC) 12/23/2008   PCP:  Bennie Pierini, FNP Pharmacy:   [facility] [phone] - [place], [place] - [address] AT COR[address] OF [facility] [address] [place] [address] Phone: [phone] Fax: [phone]     Social Determinants of Health (SDOH) Interventions    Readmission Risk Interventions    03/05/2020   12:19 PM  Readmission Risk Prevention Plan  Transportation Screening Complete  PCP or Specialist Appt within 5-7 Days Not Complete  Not Complete comments Pt discharging to SNF  Home Care Screening Not Complete  Home Care Screening Not Completed Comments Pt discharging to SNF  Medication Review (RN CM) Complete

## 2022-03-28 NOTE — Plan of Care (Signed)
  Problem: Education: Goal: Understanding of cardiac disease, CV risk reduction, and recovery process will improve Outcome: Progressing   Problem: Cardiac: Goal: Ability to achieve and maintain adequate cardiovascular perfusion will improve Outcome: Progressing   Problem: Activity: Goal: Ability to tolerate increased activity will improve Outcome: Not Progressing
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
## 2022-03-28 NOTE — Progress Notes (Signed)
Pt. Arrived from ER via stretcher. Answering questions but with O2 saturations of 58, with good waveform. Placed pt. On 100%NRB with 10L high flow with saturations barely reaching 90%. Pt. Is combative and is complaining about his legs hurting. Bilateral soft restraints placed for safety. MD notified and MD at bedside to evaluate. See MD orders for interventions. Will continue to monitor closely
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
## 2022-03-28 NOTE — Progress Notes (Signed)
Progress Note  Patient Name: Andre Malone Date of Encounter: 03/28/2022  [facility] Cardiologist: Verne Carrow, MD   Subjective   Patient is sedated and sleeping.   Inpatient Medications    Scheduled Meds:  amLODipine  2.5 mg Oral Daily   aspirin EC  81 mg Oral Daily   clopidogrel  75 mg Oral Q breakfast   divalproex  125 mg Oral Q8H   ezetimibe  10 mg Oral Daily   furosemide  20 mg Oral Daily   insulin aspart  0-9 Units Subcutaneous TID WC   levothyroxine  112 mcg Oral Daily   senna-docusate  1 tablet Oral Daily   sertraline  100 mg Oral Daily   traZODone  100 mg Oral QHS   Continuous Infusions:  heparin 1,100 Units/hr (03/27/22 2351)   PRN Meds: acetaminophen, haloperidol lactate, LORazepam, nitroGLYCERIN, ondansetron (ZOFRAN) IV, oxyCODONE, perflutren lipid microspheres (DEFINITY) IV suspension, polyethylene glycol   Vital Signs    Vitals:   03/28/22 0045 03/28/22 0200 03/28/22 0300 03/28/22 0800  BP: 124/81 105/64 132/83   Pulse: (!) 102 75 70 65  Resp: 20     Temp:      TempSrc:      SpO2: (!) 87% 97% 100% 92%  Weight:      Height:        Intake/Output Summary (Last 24 hours) at 03/28/2022 1000 Last data filed at 03/27/2022 2351 Gross per 24 hour  Intake 208.93 ml  Output --  Net 208.93 ml      03/27/2022    3:00 AM 03/20/2022    4:56 PM 03/17/2022    7:33 PM  Last 3 Weights  Weight (lbs) 196 lb 6.9 oz 196 lb 6.9 oz 209 lb 14.1 oz  Weight (kg) 89.1 kg 89.1 kg 95.2 kg      Telemetry    A sensed and V Paced - Personally Reviewed  ECG    AV sequential pacing. - Personally Reviewed  Physical Exam   GEN: obese. sedated Neck: No JVD Cardiac: RRR, no murmurs, rubs, or gallops. Mild bruising at pacer site Respiratory: rales GI: Soft, nontender, non-distended  MS: No edema; No deformity. Neuro:  Nonfocal, sedated.    Labs    High Sensitivity Troponin:   Recent Labs  Lab 03/27/22 0104 03/27/22 0210  TROPONINIHS 452*  447*     Chemistry Recent Labs  Lab 03/23/22 0110 03/27/22 0104 03/28/22 0608  NA 137 137 139  K 3.3* 3.7 4.2  CL 99 103 103  CO2 25 27 29   GLUCOSE 160* 131* 129*  BUN 29* 27* 28*  CREATININE 2.03* 1.96* 1.86*  CALCIUM 8.6* 9.6 9.0  PROT  --  7.1  --   ALBUMIN  --  3.9  --   AST  --  34  --   ALT  --  17  --   ALKPHOS  --  68  --   BILITOT  --  0.7  --   GFRNONAA 35* 36* 38*  ANIONGAP 13 7 7     Lipids No results for input(s): "CHOL", "TRIG", "HDL", "LABVLDL", "LDLCALC", "CHOLHDL" in the last 168 hours.  Hematology Recent Labs  Lab 03/24/22 0116 03/27/22 0104 03/28/22 0608  WBC 6.6 7.6 11.3*  RBC 4.22 4.14* 4.23  HGB 12.3* 12.2* 12.6*  HCT 37.5* 36.8* 38.3*  MCV 88.9 88.9 90.5  MCH 29.1 29.5 29.8  MCHC 32.8 33.2 32.9  RDW 15.1 15.0 15.0  PLT 144* 162 155  Thyroid No results for input(s): "TSH", "FREET4" in the last 168 hours.  BNPNo results for input(s): "BNP", "PROBNP" in the last 168 hours.  DDimer No results for input(s): "DDIMER" in the last 168 hours.   Radiology    ECHOCARDIOGRAM COMPLETE  Result Date: 03/28/2022    ECHOCARDIOGRAM REPORT   Patient Name:   Andre Malone Date of Exam: 03/28/2022 Medical Rec #:  [id number]        Height:       66.0 in Accession #:    [id number]       Weight:       196.4 lb Date of Birth:  1951-03-17       BSA:          1.985 m Patient Age:    71 years         BP:           103/54 mmHg Patient Gender: M                HR:           62 bpm. Exam Location:  Inpatient Procedure: 2D Echo, Color Doppler, Cardiac Doppler and Intracardiac            Opacification Agent Indications:    Acute Ischemic Heart Disease i24.9  History:        Patient has prior history of Echocardiogram examinations, most                 recent 01/05/2021. CAD, Pacemaker, Arrythmias:Atrial Fibrillation;                 Risk Factors:Hypertension, Diabetes and Dyslipidemia. Mechanical                 AVR with Bentall.  Sonographer:    Irving Burton Senior RDCS Referring  Phys: Mikki Santee, S  Sonographer Comments: Technically difficult study, performed as efficiently as possible. Patient has been very combative this admission. IMPRESSIONS  1. Cannot fully assess LV wall motion. The apex appears akinetic. Left ventricular ejection fraction, by estimation, is 50 to 55%. The left ventricle has low normal function. Left ventricular diastolic parameters are indeterminate.  2. Right ventricular systolic function was not well visualized. The right ventricular size is not well visualized. There is normal pulmonary artery systolic pressure. The estimated right ventricular systolic pressure is 30.9 mmHg.  3. No evidence of mitral valve regurgitation.  4. S/p mechanical AVR wihth Bentall 2010. No gradient assessment.  5. The inferior vena cava is normal in size with greater than 50% respiratory variability, suggesting right atrial pressure of 3 mmHg. Conclusion(s)/Recommendation(s): Limited study. FINDINGS  Left Ventricle: Cannot fully assess LV wall motion. The apex appears akinetic. Left ventricular ejection fraction, by estimation, is 50 to 55%. The left ventricle has low normal function. Definity contrast agent was given IV to delineate the left ventricular endocardial borders. The left ventricular internal cavity size was normal in size. Abnormal (paradoxical) septal motion, consistent with RV pacemaker. Left ventricular diastolic parameters are indeterminate. Right Ventricle: The right ventricular size is not well visualized. Right ventricular systolic function was not well visualized. There is normal pulmonary artery systolic pressure. The tricuspid regurgitant velocity is 2.64 m/s, and with an assumed right  atrial pressure of 3 mmHg, the estimated right ventricular systolic pressure is 30.9 mmHg. Left Atrium: Left atrial size was normal in size. Right Atrium: Right atrial size was normal in size. Mitral Valve: No evidence of mitral valve regurgitation. Tricuspid Valve:  Tricuspid  valve regurgitation is mild. Aortic Valve: S/p mechanical AVR wihth Bentall 2010. No gradient assessment. Aortic valve mean gradient measures 14.0 mmHg. Aortic valve peak gradient measures 21.5 mmHg. Aortic valve area, by VTI measures 1.42 cm. Pulmonic Valve: Pulmonic valve regurgitation is not visualized. Venous: The inferior vena cava is normal in size with greater than 50% respiratory variability, suggesting right atrial pressure of 3 mmHg.  LEFT VENTRICLE PLAX 2D LVIDd:         4.90 cm   Diastology LVIDs:         3.50 cm   LV e' medial:    4.13 cm/s LV PW:         1.40 cm   LV E/e' medial:  21.0 LV IVS:        1.20 cm   LV e' lateral:   5.55 cm/s LVOT diam:     2.10 cm   LV E/e' lateral: 15.6 LV SV:         67 LV SV Index:   34 LVOT Area:     3.46 cm  RIGHT VENTRICLE RV S prime:     6.96 cm/s TAPSE (M-mode): 1.8 cm LEFT ATRIUM             Index LA diam:        4.90 cm 2.47 cm/m LA Vol (A2C):   50.1 ml 25.24 ml/m LA Vol (A4C):   60.4 ml 30.44 ml/m LA Biplane Vol: 59.7 ml 30.08 ml/m  AORTIC VALVE AV Area (Vmax):    1.40 cm AV Area (Vmean):   1.23 cm AV Area (VTI):     1.42 cm AV Vmax:           232.00 cm/s AV Vmean:          176.000 cm/s AV VTI:            0.473 m AV Peak Grad:      21.5 mmHg AV Mean Grad:      14.0 mmHg LVOT Vmax:         93.90 cm/s LVOT Vmean:        62.400 cm/s LVOT VTI:          0.194 m LVOT/AV VTI ratio: 0.41 MITRAL VALVE               TRICUSPID VALVE MV Area (PHT): 3.06 cm    TR Peak grad:   27.9 mmHg MV Decel Time: 248 msec    TR Vmax:        264.00 cm/s MV E velocity: 86.80 cm/s MV A velocity: 90.50 cm/s  SHUNTS MV E/A ratio:  0.96        Systemic VTI:  0.19 m                            Systemic Diam: 2.10 cm Phineas Inches Electronically signed by Phineas Inches Signature Date/Time: 03/28/2022/9:11:52 AM    Final    DG Chest Port 1 View  Result Date: 03/28/2022 CLINICAL DATA:  [id number] respiratory failure with hypoxia EXAM: PORTABLE CHEST 1 VIEW COMPARISON:  March 26, 2022 FINDINGS:  Again seen are the sternotomy wires and left subclavian pacemaker leads, stable. Cardiomediastinal silhouette is mildly prominent, stable. There is dense consolidation seen in the right upper lobe and to a lesser extent at the left upper lobe region and are new. Small patchy opacities at the bibasilar regions. No pleural effusion. Bony thorax is unremarkable.  IMPRESSION: There is dense consolidation in the right upper lobe and to a lesser extent at the left upper lobe region of multifocal pneumonia and are new. Bibasilar atelectasis greater on the left. Electronically Signed   By: Frazier Richards M.D.   On: 03/28/2022 08:09   DG Chest Port 1 View  Result Date: 03/11/2022 CLINICAL DATA:  Chest pain. EXAM: PORTABLE CHEST 1 VIEW COMPARISON:  March 17, 2022 FINDINGS: There is a multi lead AICD. Multiple sternal wires are noted. The heart size and mediastinal contours are within normal limits. There is moderate severity calcification of the aortic arch. An artificial aortic valve is noted. Both lungs are clear. Multilevel degenerative changes are seen throughout the thoracic spine. IMPRESSION: 1. Prior median sternotomy/CABG. 2. No acute or active cardiopulmonary disease. Electronically Signed   By: Virgina Norfolk M.D.   On: 03/19/2022 23:19    Cardiac Studies   12/07/2020; TTE IMPRESSIONS   1. There is prominent apex-to base and septal-lateral left ventricular  dyssynchrony due to RV apical pacing. Left ventricular ejection fraction,  by estimation, is 60 to 65%. The left ventricle has normal function. The  left ventricle has no regional wall   motion abnormalities. There is moderate concentric left ventricular  hypertrophy. Left ventricular diastolic parameters are consistent with  Grade II diastolic dysfunction (pseudonormalization). Elevated left atrial  pressure.   2. Right ventricular systolic function is normal. The right ventricular  size is mildly enlarged.   3. Left atrial size was severely  dilated.   4. Right atrial size was mildly dilated.   5. The mitral valve is normal in structure. No evidence of mitral valve  regurgitation.   6. The aortic valve has been repaired/replaced. Aortic valve  regurgitation is not visualized. Moderate to severe aortic valve stenosis.  There is a 21 mm St. Jude valve present in the aortic position. Procedure  Date: 2010. Aortic valve mean gradient  measures 28.0 mmHg. Aortic valve Vmax measures 3.60 m/s.   7. Aortic root/ascending aorta has been repaired/replaced.      12/06/2020: LHC/PCI RPDA lesion is 80% stenosed. Prox RCA to Mid RCA lesion is 80% stenosed. 1st Diag lesion is 75% stenosed. Prox Cx to Mid Cx lesion is 90% stenosed. Prox Cx lesion is 50% stenosed. A drug-eluting stent was successfully placed using a STENT RESOLUTE ONYX 3.0X18. Post intervention, there is a 0% residual stenosis. A drug-eluting stent was successfully placed using a STENT RESOLUTE ONYX 3.0X18. Post intervention, there is a 0% residual stenosis. Post intervention, there is a 0% residual stenosis.    Patient Profile     71 y.o. male with pmh sx for hypertension, hyperlipidemia, PAF on chronic anticoagulation, CAD s/p PCI, aortic stenosis s/p AVR, CHB s/p PPM in 2010 who had recent generator replacement + worsening dementia who is being seen 03/27/2022 for the evaluation of NSTEMI. Patient subsequently had increased confusion and combativeness and involuntarily committed.   Assessment & Plan    NSTEMI. Suspect in retrospect this may be demand ischemia in setting of PNA and agitation. Patient did initially call EMS with chest pain but later in the ED had worsening confusion, agitation and combativeness. Involuntarily committed. Ecg is uninterpretable due to paced rhythm. Troponin 452> 447. Awaiting Echo results. Patient is a poor candidate for invasive evaluation due advanced dementia, combativeness, CKD, and PNA. Would treat conservatively. Currently on DAPT,  amlodipine.  Mechanical AV. On coumadin. INR 1.6. Would cover with IV heparin for now. Once it is clear that  no invasive procedures planned would resume coumadin and stop ASA Acute respiratory distress with hypoxia. CXR with RUL and LUL consolidation c/w PNA. ? Aspiration. Management per pulmonary/hospitalist team Severe dementia with acute sundowning/ confusion/ agitation. Quite  sedated now post IV Haldol.  CKD stage 3b S/p Pacemaker with recent generator change out one week ago.  Hypothyroidism.  HLD  For questions or updates, please contact [place] Please consult [email or web address] for contact info under        Signed, Jessah Danser [place], MD  03/28/2022, 10:00 AM

## 2022-03-29 DIAGNOSIS — I214 Non-ST elevation (NSTEMI) myocardial infarction: Secondary | ICD-10-CM | POA: Diagnosis not present

## 2022-03-29 DIAGNOSIS — E1140 Type 2 diabetes mellitus with diabetic neuropathy, unspecified: Secondary | ICD-10-CM | POA: Diagnosis not present

## 2022-03-29 DIAGNOSIS — Z952 Presence of prosthetic heart valve: Secondary | ICD-10-CM | POA: Diagnosis not present

## 2022-03-29 DIAGNOSIS — J9601 Acute respiratory failure with hypoxia: Secondary | ICD-10-CM | POA: Diagnosis not present

## 2022-03-29 DIAGNOSIS — G2581 Restless legs syndrome: Secondary | ICD-10-CM

## 2022-03-29 DIAGNOSIS — I480 Paroxysmal atrial fibrillation: Secondary | ICD-10-CM | POA: Diagnosis not present

## 2022-03-29 DIAGNOSIS — I10 Essential (primary) hypertension: Secondary | ICD-10-CM | POA: Diagnosis not present

## 2022-03-29 DIAGNOSIS — I2510 Atherosclerotic heart disease of native coronary artery without angina pectoris: Secondary | ICD-10-CM | POA: Diagnosis not present

## 2022-03-29 DIAGNOSIS — Z794 Long term (current) use of insulin: Secondary | ICD-10-CM

## 2022-03-29 DIAGNOSIS — J189 Pneumonia, unspecified organism: Secondary | ICD-10-CM

## 2022-03-29 DIAGNOSIS — E039 Hypothyroidism, unspecified: Secondary | ICD-10-CM | POA: Diagnosis not present

## 2022-03-29 DIAGNOSIS — F03918 Unspecified dementia, unspecified severity, with other behavioral disturbance: Secondary | ICD-10-CM | POA: Diagnosis not present

## 2022-03-29 DIAGNOSIS — N1832 Chronic kidney disease, stage 3b: Secondary | ICD-10-CM | POA: Diagnosis not present

## 2022-03-29 LAB — POCT I-STAT 7, (LYTES, BLD GAS, ICA,H+H)
Acid-base deficit: 2 mmol/L (ref 0.0–2.0)
Bicarbonate: 22.5 mmol/L (ref 20.0–28.0)
Calcium, Ion: 1.12 mmol/L — ABNORMAL LOW (ref 1.15–1.40)
HCT: 39 % (ref 39.0–52.0)
Hemoglobin: 13.3 g/dL (ref 13.0–17.0)
O2 Saturation: 59 %
Patient temperature: 100
Potassium: 3.9 mmol/L (ref 3.5–5.1)
Sodium: 138 mmol/L (ref 135–145)
TCO2: 24 mmol/L (ref 22–32)
pCO2 arterial: 38.4 mmHg (ref 32–48)
pH, Arterial: 7.379 (ref 7.35–7.45)
pO2, Arterial: 33 mmHg — CL (ref 83–108)

## 2022-03-29 LAB — BASIC METABOLIC PANEL
Anion gap: 13 (ref 5–15)
BUN: 19 mg/dL (ref 8–23)
CO2: 20 mmol/L — ABNORMAL LOW (ref 22–32)
Calcium: 9 mg/dL (ref 8.9–10.3)
Chloride: 108 mmol/L (ref 98–111)
Creatinine, Ser: 1.69 mg/dL — ABNORMAL HIGH (ref 0.61–1.24)
GFR, Estimated: 43 mL/min — ABNORMAL LOW (ref >60)
Glucose, Bld: 151 mg/dL — ABNORMAL HIGH (ref 70–99)
Potassium: 3.8 mmol/L (ref 3.5–5.1)
Sodium: 141 mmol/L (ref 135–145)

## 2022-03-29 LAB — CBC
HCT: 41.1 % (ref 39.0–52.0)
Hemoglobin: 13.8 g/dL (ref 13.0–17.0)
MCH: 29.9 pg (ref 26.0–34.0)
MCHC: 33.6 g/dL (ref 30.0–36.0)
MCV: 89 fL (ref 80.0–100.0)
Platelets: 207 10*3/uL (ref 150–400)
RBC: 4.62 MIL/uL (ref 4.22–5.81)
RDW: 15.2 % (ref 11.5–15.5)
WBC: 18 10*3/uL — ABNORMAL HIGH (ref 4.0–10.5)
nRBC: 0 % (ref 0.0–0.2)

## 2022-03-29 LAB — CK: Total CK: 662 U/L — ABNORMAL HIGH (ref 49–397)

## 2022-03-29 LAB — GLUCOSE, CAPILLARY
Glucose-Capillary: 179 mg/dL — ABNORMAL HIGH (ref 70–99)
Glucose-Capillary: 185 mg/dL — ABNORMAL HIGH (ref 70–99)
Glucose-Capillary: 185 mg/dL — ABNORMAL HIGH (ref 70–99)
Glucose-Capillary: 175 mg/dL — ABNORMAL HIGH (ref 70–99)

## 2022-03-29 LAB — HEPARIN LEVEL (UNFRACTIONATED): Heparin Unfractionated: 0.31 IU/mL (ref 0.30–0.70)

## 2022-03-29 LAB — PROTIME-INR
INR: 1.8 — ABNORMAL HIGH (ref 0.8–1.2)
Prothrombin Time: 21.1 seconds — ABNORMAL HIGH (ref 11.4–15.2)

## 2022-03-29 MED ORDER — WARFARIN SODIUM 5 MG PO TABS: 5.0000 mg | ORAL_TABLET | Freq: Once | ORAL | Status: DC

## 2022-03-29 MED ORDER — ACETAMINOPHEN 650 MG RE SUPP
650.0000 mg | Freq: Once | RECTAL | Status: AC
  Administered 2022-03-29: 650 mg via RECTAL
  Filled 2022-03-29: qty 1

## 2022-03-29 MED ORDER — WARFARIN - PHARMACIST DOSING INPATIENT: Freq: Every day | Status: DC

## 2022-03-29 MED ORDER — MORPHINE SULFATE (PF) 2 MG/ML IV SOLN
2.0000 mg | Freq: Once | INTRAVENOUS | Status: AC
  Administered 2022-03-29: 2 mg via INTRAVENOUS
  Filled 2022-03-29: qty 1

## 2022-03-29 NOTE — Progress Notes (Signed)
Progress Note  Patient Name: Andre Malone Date of Encounter: 03/29/2022  [facility] Cardiologist: Lauree Chandler, MD   Subjective   Patient is awake, pulling on restraints. Answers "yeah" to name. Otherwise no verbal response.   Inpatient Medications    Scheduled Meds:  amLODipine  2.5 mg Oral Daily   aspirin EC  81 mg Oral Daily   clopidogrel  75 mg Oral Q breakfast   divalproex  125 mg Oral Q8H   ezetimibe  10 mg Oral Daily   furosemide  20 mg Oral Daily   insulin aspart  0-9 Units Subcutaneous TID WC   levothyroxine  112 mcg Oral Daily   senna-docusate  1 tablet Oral Daily   sertraline  100 mg Oral Daily   thiamine injection  100 mg Intravenous Daily   traZODone  100 mg Oral QHS   Continuous Infusions:  ampicillin-sulbactam (UNASYN) IV 3 g (03/29/22 0525)   heparin 1,250 Units/hr (03/28/22 1140)   PRN Meds: acetaminophen, LORazepam, nitroGLYCERIN, ondansetron (ZOFRAN) IV, oxyCODONE, polyethylene glycol   Vital Signs    Vitals:   03/29/22 0409 03/29/22 0536 03/29/22 0620 03/29/22 0900  BP: 135/73   132/76  Pulse: (!) 101   98  Resp: 18   18  Temp: (!) 100.7 F (38.2 C) (!) 100.8 F (38.2 C) 99.6 F (37.6 C) 98.2 F (36.8 C)  TempSrc: Axillary Axillary Axillary Axillary  SpO2: 100%     Weight:      Height:        Intake/Output Summary (Last 24 hours) at 03/29/2022 0955 Last data filed at 03/29/2022 0525 Gross per 24 hour  Intake 300 ml  Output 450 ml  Net -150 ml       03/27/2022    3:00 AM 03/20/2022    4:56 PM 03/17/2022    7:33 PM  Last 3 Weights  Weight (lbs) 196 lb 6.9 oz 196 lb 6.9 oz 209 lb 14.1 oz  Weight (kg) 89.1 kg 89.1 kg 95.2 kg      Telemetry    A sensed and V Paced - Personally Reviewed  ECG    AV sequential pacing. - Personally Reviewed  Physical Exam   GEN: obese. Neck: No JVD Cardiac: RRR, no murmurs, rubs, or gallops. Mild bruising at pacer site Respiratory: increased work of breathing with bilateral  wheezes  GI: Soft, nontender, non-distended  MS: No edema; No deformity. Neuro:  Nonfocal, sedated. In restraints. delerious   Labs    High Sensitivity Troponin:   Recent Labs  Lab 03/27/22 0104 03/27/22 0210  TROPONINIHS 452* 447*      Chemistry Recent Labs  Lab 03/27/22 0104 03/28/22 0608 03/29/22 0144 03/29/22 0233  NA 137 139 141 138  K 3.7 4.2 3.8 3.9  CL 103 103 108  --   CO2 27 29 20*  --   GLUCOSE 131* 129* 151*  --   BUN 27* 28* 19  --   CREATININE 1.96* 1.86* 1.69*  --   CALCIUM 9.6 9.0 9.0  --   PROT 7.1  --   --   --   ALBUMIN 3.9  --   --   --   AST 34  --   --   --   ALT 17  --   --   --   ALKPHOS 68  --   --   --   BILITOT 0.7  --   --   --   GFRNONAA 36* 38*  43*  --   ANIONGAP 7 7 13   --      Lipids No results for input(s): "CHOL", "TRIG", "HDL", "LABVLDL", "LDLCALC", "CHOLHDL" in the last 168 hours.  Hematology Recent Labs  Lab 03/27/22 0104 03/28/22 0608 03/29/22 0144 03/29/22 0233  WBC 7.6 11.3* 18.0*  --   RBC 4.14* 4.23 4.62  --   HGB 12.2* 12.6* 13.8 13.3  HCT 36.8* 38.3* 41.1 39.0  MCV 88.9 90.5 89.0  --   MCH 29.5 29.8 29.9  --   MCHC 33.2 32.9 33.6  --   RDW 15.0 15.0 15.2  --   PLT 162 155 207  --     Thyroid No results for input(s): "TSH", "FREET4" in the last 168 hours.  BNPNo results for input(s): "BNP", "PROBNP" in the last 168 hours.  DDimer No results for input(s): "DDIMER" in the last 168 hours.   Radiology    ECHOCARDIOGRAM COMPLETE  Result Date: 03/28/2022    ECHOCARDIOGRAM REPORT   Patient Name:   Andre Malone Date of Exam: 03/28/2022 Medical Rec #:  03/16/2022        Height:       66.0 in Accession #:    [id number]       Weight:       196.4 lb Date of Birth:  1950-10-14       BSA:          1.985 m Patient Age:    70 years         BP:           103/54 mmHg Patient Gender: M                HR:           62 bpm. Exam Location:  Inpatient Procedure: 2D Echo, Color Doppler, Cardiac Doppler and Intracardiac             Opacification Agent Indications:    Acute Ischemic Heart Disease i24.9  History:        Patient has prior history of Echocardiogram examinations, most                 recent 01/05/2021. CAD, Pacemaker, Arrythmias:Atrial Fibrillation;                 Risk Factors:Hypertension, Diabetes and Dyslipidemia. Mechanical                 AVR with Bentall.  Sonographer:    03/07/2021 Senior RDCS Referring Phys: Irving Burton, S  Sonographer Comments: Technically difficult study, performed as efficiently as possible. Patient has been very combative this admission. IMPRESSIONS  1. Cannot fully assess LV wall motion. The apex appears akinetic. Left ventricular ejection fraction, by estimation, is 50 to 55%. The left ventricle has low normal function. Left ventricular diastolic parameters are indeterminate.  2. Right ventricular systolic function was not well visualized. The right ventricular size is not well visualized. There is normal pulmonary artery systolic pressure. The estimated right ventricular systolic pressure is 30.9 mmHg.  3. No evidence of mitral valve regurgitation.  4. S/p mechanical AVR wihth Bentall 2010. No gradient assessment.  5. The inferior vena cava is normal in size with greater than 50% respiratory variability, suggesting right atrial pressure of 3 mmHg. Conclusion(s)/Recommendation(s): Limited study. FINDINGS  Left Ventricle: Cannot fully assess LV wall motion. The apex appears akinetic. Left ventricular ejection fraction, by estimation, is 50 to 55%. The left ventricle has low normal function.  Definity contrast agent was given IV to delineate the left ventricular endocardial borders. The left ventricular internal cavity size was normal in size. Abnormal (paradoxical) septal motion, consistent with RV pacemaker. Left ventricular diastolic parameters are indeterminate. Right Ventricle: The right ventricular size is not well visualized. Right ventricular systolic function was not well visualized. There is  normal pulmonary artery systolic pressure. The tricuspid regurgitant velocity is 2.64 m/s, and with an assumed right  atrial pressure of 3 mmHg, the estimated right ventricular systolic pressure is 30.9 mmHg. Left Atrium: Left atrial size was normal in size. Right Atrium: Right atrial size was normal in size. Mitral Valve: No evidence of mitral valve regurgitation. Tricuspid Valve: Tricuspid valve regurgitation is mild. Aortic Valve: S/p mechanical AVR wihth Bentall 2010. No gradient assessment. Aortic valve mean gradient measures 14.0 mmHg. Aortic valve peak gradient measures 21.5 mmHg. Aortic valve area, by VTI measures 1.42 cm. Pulmonic Valve: Pulmonic valve regurgitation is not visualized. Venous: The inferior vena cava is normal in size with greater than 50% respiratory variability, suggesting right atrial pressure of 3 mmHg.  LEFT VENTRICLE PLAX 2D LVIDd:         4.90 cm   Diastology LVIDs:         3.50 cm   LV e' medial:    4.13 cm/s LV PW:         1.40 cm   LV E/e' medial:  21.0 LV IVS:        1.20 cm   LV e' lateral:   5.55 cm/s LVOT diam:     2.10 cm   LV E/e' lateral: 15.6 LV SV:         67 LV SV Index:   34 LVOT Area:     3.46 cm  RIGHT VENTRICLE RV S prime:     6.96 cm/s TAPSE (M-mode): 1.8 cm LEFT ATRIUM             Index LA diam:        4.90 cm 2.47 cm/m LA Vol (A2C):   50.1 ml 25.24 ml/m LA Vol (A4C):   60.4 ml 30.44 ml/m LA Biplane Vol: 59.7 ml 30.08 ml/m  AORTIC VALVE AV Area (Vmax):    1.40 cm AV Area (Vmean):   1.23 cm AV Area (VTI):     1.42 cm AV Vmax:           232.00 cm/s AV Vmean:          176.000 cm/s AV VTI:            0.473 m AV Peak Grad:      21.5 mmHg AV Mean Grad:      14.0 mmHg LVOT Vmax:         93.90 cm/s LVOT Vmean:        62.400 cm/s LVOT VTI:          0.194 m LVOT/AV VTI ratio: 0.41 MITRAL VALVE               TRICUSPID VALVE MV Area (PHT): 3.06 cm    TR Peak grad:   27.9 mmHg MV Decel Time: 248 msec    TR Vmax:        264.00 cm/s MV E velocity: 86.80 cm/s MV A  velocity: 90.50 cm/s  SHUNTS MV E/A ratio:  0.96        Systemic VTI:  0.19 m  Systemic Diam: 2.10 cm Phineas Inches Electronically signed by Phineas Inches Signature Date/Time: 03/28/2022/9:11:52 AM    Final    DG Chest Port 1 View  Result Date: 03/28/2022 CLINICAL DATA:  [id number] respiratory failure with hypoxia EXAM: PORTABLE CHEST 1 VIEW COMPARISON:  March 26, 2022 FINDINGS: Again seen are the sternotomy wires and left subclavian pacemaker leads, stable. Cardiomediastinal silhouette is mildly prominent, stable. There is dense consolidation seen in the right upper lobe and to a lesser extent at the left upper lobe region and are new. Small patchy opacities at the bibasilar regions. No pleural effusion. Bony thorax is unremarkable. IMPRESSION: There is dense consolidation in the right upper lobe and to a lesser extent at the left upper lobe region of multifocal pneumonia and are new. Bibasilar atelectasis greater on the left. Electronically Signed   By: Frazier Richards M.D.   On: 03/28/2022 08:09    Cardiac Studies   12/07/2020; TTE IMPRESSIONS   1. There is prominent apex-to base and septal-lateral left ventricular  dyssynchrony due to RV apical pacing. Left ventricular ejection fraction,  by estimation, is 60 to 65%. The left ventricle has normal function. The  left ventricle has no regional wall   motion abnormalities. There is moderate concentric left ventricular  hypertrophy. Left ventricular diastolic parameters are consistent with  Grade II diastolic dysfunction (pseudonormalization). Elevated left atrial  pressure.   2. Right ventricular systolic function is normal. The right ventricular  size is mildly enlarged.   3. Left atrial size was severely dilated.   4. Right atrial size was mildly dilated.   5. The mitral valve is normal in structure. No evidence of mitral valve  regurgitation.   6. The aortic valve has been repaired/replaced. Aortic valve  regurgitation is not  visualized. Moderate to severe aortic valve stenosis.  There is a 21 mm St. Jude valve present in the aortic position. Procedure  Date: 2010. Aortic valve mean gradient  measures 28.0 mmHg. Aortic valve Vmax measures 3.60 m/s.   7. Aortic root/ascending aorta has been repaired/replaced.      12/06/2020: LHC/PCI RPDA lesion is 80% stenosed. Prox RCA to Mid RCA lesion is 80% stenosed. 1st Diag lesion is 75% stenosed. Prox Cx to Mid Cx lesion is 90% stenosed. Prox Cx lesion is 50% stenosed. A drug-eluting stent was successfully placed using a STENT RESOLUTE ONYX 3.0X18. Post intervention, there is a 0% residual stenosis. A drug-eluting stent was successfully placed using a STENT RESOLUTE ONYX 3.0X18. Post intervention, there is a 0% residual stenosis. Post intervention, there is a 0% residual stenosis.  Echo 03/28/22: IMPRESSIONS     1. Cannot fully assess LV wall motion. The apex appears akinetic. Left  ventricular ejection fraction, by estimation, is 50 to 55%. The left  ventricle has low normal function. Left ventricular diastolic parameters  are indeterminate.   2. Right ventricular systolic function was not well visualized. The right  ventricular size is not well visualized. There is normal pulmonary artery  systolic pressure. The estimated right ventricular systolic pressure is  123456 mmHg.   3. No evidence of mitral valve regurgitation.   4. S/p mechanical AVR wihth Bentall 2010. No gradient assessment.   5. The inferior vena cava is normal in size with greater than 50%  respiratory variability, suggesting right atrial pressure of 3 mmHg.   Conclusion(s)/Recommendation(s): Limited study.     Patient Profile     71 y.o. male with pmh sx for hypertension, hyperlipidemia, PAF on chronic anticoagulation,  CAD s/p PCI, aortic stenosis s/p AVR, CHB s/p PPM in 2010 who had recent generator replacement + worsening dementia who is being seen 03/27/2022 for the evaluation of NSTEMI.  Patient subsequently had increased confusion and combativeness and involuntarily committed.   Assessment & Plan    NSTEMI. Suspect in retrospect this may be demand ischemia in setting of PNA and agitation. Patient did initially call EMS with chest pain but later in the ED had worsening confusion, agitation and combativeness. Involuntarily committed. Ecg is uninterpretable due to paced rhythm. Troponin mildly elevated with flat trend 452> 447. Echo shows EF 50-55% with apical wall motion abnormality.  Patient is a poor candidate for invasive evaluation due advanced dementia, combativeness, CKD, and PNA. NO cardiac cath planned. Would treat conservatively. Currently on DAPT, amlodipine 2.5 mg daily. Will add low dose bisoprolol. If no further procedures anticipated I am OK with resuming Coumadin and stopping ASA.  Mechanical AV.  INR 1.8. on  IV heparin for now. OK with resuming Coumadin from cardiac standpoint. Would stop ASA Acute respiratory distress with hypoxia. CXR with RUL and LUL consolidation c/w PNA.  Aspiration. Management per pulmonary/hospitalist team Severe dementia with acute sundowning/ confusion/ agitation/delirium. Really a long term problem. CKD stage 3b S/p Pacemaker with recent generator change out one week ago.  Hypothyroidism.  HLD  For questions or updates, please contact [place] Please consult [email or web address] for contact info under        Signed, Ahmere Hemenway [place], MD  03/29/2022, 9:55 AM

## 2022-03-29 NOTE — Consult Note (Signed)
  Patient seen and attempted to assess. Patient was observed lying in bed, restless and in bilateral soft wrist restraints. Patient was wearing a NRB mask and appeared to be in respiratory distress. Unable to participate in psychiatric evaluation.  NP notified staff nurse Kathlene November of patients current condition. Currently awaiting PMT, suspect patient is transitioning.   Psychiatry will sign off at this time.

## 2022-03-29 NOTE — Progress Notes (Signed)
Soft wrist restraints removed.  Patient placed in mittens.  MD notified.  MD also notified of continued tachypnea.  Will continue to monitor.
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
## 2022-03-29 NOTE — Significant Event (Addendum)
Rapid Response Event Note   Reason for Call :  Tachypnea, restlessness. No acute change from previous assessments.  Initial Focused Assessment:  Pt lying in bed, restless. Not following commands. Skin is warm, dry, pale. Lung sounds are rhonchus throughout, left > right. Pt tachypneic, with accessory muscle use. PERRLA, 92mm.   ABG 7/26 at 0230:  7.379/ 38.4/ 33/ 22.5  Pt receiving Ativan for agitation. He has received a total of 4mg  IV since 0000.  VS: T 99.30F, BP 128/76, HR 107, RR 48, SpO2 84% on 100% NRB - poor SpO2 pleth, pt restless CBG: 185  Interventions:  -No intervention from RR RN  Plan of Care:  -Morphine 2mg  IV x1 dose -Goals of care discussion with patient family -Continue to provide supportive care -Can increase O2 to [facility] if unable to achieve SpO2 >92%  Call rapid response for additional needs  Event Summary:  MD Notified: Dr. Arrival Time: 1200 End Time: 1215  Tyson Babinski, RN

## 2022-03-29 NOTE — Progress Notes (Signed)
2052 Pt temp is 100.7. Pt Changed to non-rebreather 15L. Pt is lethargic and restless. RN will continue to monitor pt. A. Kakrakandy,MD notified.  2100 Orders received.   0300 Pt agitated and in distress. Temp is 100. A. Kakrakandy,MD notified. RN will continue to monitor pt.  Pt seen at bedside by MD. Orders received. RN will continue to monitor pt.

## 2022-03-29 NOTE — Progress Notes (Addendum)
PROGRESS NOTE    PADDY NEIS  EXH:[id number] DOB: [birth year] DOA: 03/06/2022 PCP: Bennie Pierini, FNP    Brief Narrative:  71 year old male with multiple comorbidities including Alzheimer's dementia, CAD s/p PCI, PAF, aortic stenosis s/p AVR, complete heart block s/p PPM, s/p recent generator exchange 7/23, chronic diastolic heart failure, hypertension, diabetes mellitus type 2, CKD stage IIIb, restless leg syndrome who presented to the ED with chest pain and was diagnosed with NSTEMI.  Patient was recently hospitalized for generator exchange at that time hospital course was complicated by altered mental status.  Neurology was consulted at that time it was deemed that patient has dementia.  Psychiatry was consulted for agitation, patient started on Seroquel however Seroquel was stopped due to concern for worsening restless leg syndrome.  Patient was discharged home, however patient came back with chest pain.  Got agitated in the ED, psychiatry was consulted, deemed patient has no capacity and he was [facility].  Wellbutrin was discontinued which patient was taking at home.  Patient has been started on Depakote.  He has been very somnolent since starting Depakote with episodes of agitation requiring restraints.  Patient became hypoxemic so PCCM was consulted currently on nonrebreather mask.TRH was consulted for worsening delirium   Assessment and Plan:  Principal Problem:   NSTEMI (non-ST elevated myocardial infarction) (HCC) Active Problems:   Hypothyroidism   Type 2 diabetes mellitus with diabetic neuropathy, with long-term current use of insulin (HCC)   CAD, NATIVE VESSEL   Chronic kidney disease, stage 3b (HCC)   Paroxysmal atrial fibrillation (HCC)   S/P AVR (aortic valve replacement)   Essential hypertension   RLS (restless legs syndrome)   Senile dementia, delirium, with behavioral disturbance (HCC)   Senile dementia, delirium with behavioral disturbance Patient with delirium  during previous hospitalization as well.  Seroquel has been discontinued due to restless leg syndrome.  Psychiatry was reconsulted.  At this time patient does not have An IVC Has Been Done.  Wellbutrin Has Been Stopped by Psychiatry.  On Depakote for Mood Stabilization.  Continue Trazodone and Sertraline.  IM Haldol As Needed for Agitation.   Acute hypoxemic respiratory failure -Likely from aspiration pneumonia.  PCCM was consulted.  Continue Unasyn. Currently on nonrebreather mask.  Will wean as able.  Restless leg syndrome Off Seroquel.  Continue trazodone and sertraline.   NSTEMI Patient presented with chest pain with elevated troponin.  Cardiology was consulted.  Patient will eventually need cardiac catheterization but is not a stable at this time.  Continue  aspirin, Plavix, Zetia   Essential hypertension -Continue amlodipine.  Continue to monitor blood pressure.   Status post AVR. On Coumadin at home.  Currently on heparin drip.   Paroxysmal atrial fibrillation On pacemaker.  Rate controlled.  On heparin drip.   CKD stage IIIb Continue to monitor creatinine.   Diabetes mellitus type 2 Recent hemoglobin A1c of 6.6.  Continue sliding scale insulin, NovoLog.   Hypothyroidism Continue Synthroid.  TSH was 2.5.     DVT prophylaxis:    Code Status:     Code Status: DNR  Disposition: Uncertain at this time likely to assisted living facility.  Status is: Inpatient  Remains inpatient appropriate because: Restlessness on nonrebreather mask, IV antibiotic   Family Communication:  I spoke with the patient's son on the phone and updated him about the clinical condition of the patient.  Spoke about potential declining situation including impending threat to life due to multifocal pneumonia with hypoxic respiratory failure with underlying  encephalopathy and agitation.  He is going to discuss with the rest of the family members about it.  Consultants:  Critical  care Cardiology  Procedures:  None  Antimicrobials:  Unasyn IV 03/28/22>  Anti-infectives (From admission, onward)    Start     Dose/Rate Route Frequency Ordered Stop   03/28/22 1345  Ampicillin-Sulbactam (UNASYN) 3 g in sodium chloride 0.9 % 100 mL IVPB        3 g 200 mL/hr over 30 Minutes Intravenous Every 8 hours 03/28/22 1258        Subjective: Today, patient was seen and examined at bedside.  Patient appears to be agitated confused disoriented on restraints at bedside with nonrebreather mask.  Objective: Vitals:   03/29/22 0409 03/29/22 0536 03/29/22 0620 03/29/22 0900  BP: 135/73   132/76  Pulse: (!) 101   98  Resp: 18   18  Temp: (!) 100.7 F (38.2 C) (!) 100.8 F (38.2 C) 99.6 F (37.6 C) 98.2 F (36.8 C)  TempSrc: Axillary Axillary Axillary Axillary  SpO2: 100%     Weight:      Height:        Intake/Output Summary (Last 24 hours) at 03/29/2022 1034 Last data filed at 03/29/2022 0525 Gross per 24 hour  Intake 300 ml  Output 450 ml  Net -150 ml   Filed Weights   03/27/22 0300  Weight: 89.1 kg    Physical Examination: Body mass index is 31.7 kg/m.   General: Obese built, restless, agitated HENT:   No scleral pallor or icterus noted. Oral mucosa is moist.  On nonrebreather mask Chest:  Diminished breath sounds bilaterally.  CVS: S1 &S2 heard. No murmur.  Regular rate and rhythm. Abdomen: Soft, nontender, nondistended.  Bowel sounds are heard.  Condom catheter in place Extremities: No cyanosis, clubbing or edema.  Peripheral pulses are palpable.  On bilateral restraints. Psych: appears agitated in distress on restraints, somnolent, not following commands CNS: Moves all extremities. Skin: Warm and dry.  No rashes noted.  Data Reviewed:   CBC: Recent Labs  Lab 03/24/22 0116 03/27/22 0104 03/28/22 0608 03/29/22 0144 03/29/22 0233  WBC 6.6 7.6 11.3* 18.0*  --   NEUTROABS  --  5.7  --   --   --   HGB 12.3* 12.2* 12.6* 13.8 13.3  HCT 37.5* 36.8*  38.3* 41.1 39.0  MCV 88.9 88.9 90.5 89.0  --   PLT 144* 162 155 207  --     Basic Metabolic Panel: Recent Labs  Lab 03/23/22 0110 03/27/22 0104 03/28/22 0608 03/29/22 0144 03/29/22 0233  NA 137 137 139 141 138  K 3.3* 3.7 4.2 3.8 3.9  CL 99 103 103 108  --   CO2 25 27 29  20*  --   GLUCOSE 160* 131* 129* 151*  --   BUN 29* 27* 28* 19  --   CREATININE 2.03* 1.96* 1.86* 1.69*  --   CALCIUM 8.6* 9.6 9.0 9.0  --     Liver Function Tests: Recent Labs  Lab 03/27/22 0104  AST 34  ALT 17  ALKPHOS 68  BILITOT 0.7  PROT 7.1  ALBUMIN 3.9     Radiology Studies: ECHOCARDIOGRAM COMPLETE  Result Date: 03/28/2022    ECHOCARDIOGRAM REPORT   Patient Name:   JERRIE ERLING Date of Exam: 03/28/2022 Medical Rec #:  [id number]        Height:       66.0 in Accession #:    [id number]  Weight:       196.4 lb Date of Birth:  1951-02-16       BSA:          1.985 m Patient Age:    36 years         BP:           103/54 mmHg Patient Gender: M                HR:           62 bpm. Exam Location:  Inpatient Procedure: 2D Echo, Color Doppler, Cardiac Doppler and Intracardiac            Opacification Agent Indications:    Acute Ischemic Heart Disease i24.9  History:        Patient has prior history of Echocardiogram examinations, most                 recent 01/05/2021. CAD, Pacemaker, Arrythmias:Atrial Fibrillation;                 Risk Factors:Hypertension, Diabetes and Dyslipidemia. Mechanical                 AVR with Bentall.  Sonographer:    Raquel Sarna Senior RDCS Referring Phys: Santina Evans, S  Sonographer Comments: Technically difficult study, performed as efficiently as possible. Patient has been very combative this admission. IMPRESSIONS  1. Cannot fully assess LV wall motion. The apex appears akinetic. Left ventricular ejection fraction, by estimation, is 50 to 55%. The left ventricle has low normal function. Left ventricular diastolic parameters are indeterminate.  2. Right ventricular systolic  function was not well visualized. The right ventricular size is not well visualized. There is normal pulmonary artery systolic pressure. The estimated right ventricular systolic pressure is 123456 mmHg.  3. No evidence of mitral valve regurgitation.  4. S/p mechanical AVR wihth Bentall 2010. No gradient assessment.  5. The inferior vena cava is normal in size with greater than 50% respiratory variability, suggesting right atrial pressure of 3 mmHg. Conclusion(s)/Recommendation(s): Limited study. FINDINGS  Left Ventricle: Cannot fully assess LV wall motion. The apex appears akinetic. Left ventricular ejection fraction, by estimation, is 50 to 55%. The left ventricle has low normal function. Definity contrast agent was given IV to delineate the left ventricular endocardial borders. The left ventricular internal cavity size was normal in size. Abnormal (paradoxical) septal motion, consistent with RV pacemaker. Left ventricular diastolic parameters are indeterminate. Right Ventricle: The right ventricular size is not well visualized. Right ventricular systolic function was not well visualized. There is normal pulmonary artery systolic pressure. The tricuspid regurgitant velocity is 2.64 m/s, and with an assumed right  atrial pressure of 3 mmHg, the estimated right ventricular systolic pressure is 123456 mmHg. Left Atrium: Left atrial size was normal in size. Right Atrium: Right atrial size was normal in size. Mitral Valve: No evidence of mitral valve regurgitation. Tricuspid Valve: Tricuspid valve regurgitation is mild. Aortic Valve: S/p mechanical AVR wihth Bentall 2010. No gradient assessment. Aortic valve mean gradient measures 14.0 mmHg. Aortic valve peak gradient measures 21.5 mmHg. Aortic valve area, by VTI measures 1.42 cm. Pulmonic Valve: Pulmonic valve regurgitation is not visualized. Venous: The inferior vena cava is normal in size with greater than 50% respiratory variability, suggesting right atrial pressure of  3 mmHg.  LEFT VENTRICLE PLAX 2D LVIDd:         4.90 cm   Diastology LVIDs:         3.50 cm  LV e' medial:    4.13 cm/s LV PW:         1.40 cm   LV E/e' medial:  21.0 LV IVS:        1.20 cm   LV e' lateral:   5.55 cm/s LVOT diam:     2.10 cm   LV E/e' lateral: 15.6 LV SV:         67 LV SV Index:   34 LVOT Area:     3.46 cm  RIGHT VENTRICLE RV S prime:     6.96 cm/s TAPSE (M-mode): 1.8 cm LEFT ATRIUM             Index LA diam:        4.90 cm 2.47 cm/m LA Vol (A2C):   50.1 ml 25.24 ml/m LA Vol (A4C):   60.4 ml 30.44 ml/m LA Biplane Vol: 59.7 ml 30.08 ml/m  AORTIC VALVE AV Area (Vmax):    1.40 cm AV Area (Vmean):   1.23 cm AV Area (VTI):     1.42 cm AV Vmax:           232.00 cm/s AV Vmean:          176.000 cm/s AV VTI:            0.473 m AV Peak Grad:      21.5 mmHg AV Mean Grad:      14.0 mmHg LVOT Vmax:         93.90 cm/s LVOT Vmean:        62.400 cm/s LVOT VTI:          0.194 m LVOT/AV VTI ratio: 0.41 MITRAL VALVE               TRICUSPID VALVE MV Area (PHT): 3.06 cm    TR Peak grad:   27.9 mmHg MV Decel Time: 248 msec    TR Vmax:        264.00 cm/s MV E velocity: 86.80 cm/s MV A velocity: 90.50 cm/s  SHUNTS MV E/A ratio:  0.96        Systemic VTI:  0.19 m                            Systemic Diam: 2.10 cm Carolan Clines Electronically signed by Carolan Clines Signature Date/Time: 03/28/2022/9:11:52 AM    Final    DG Chest Port 1 View  Result Date: 03/28/2022 CLINICAL DATA:  [id number] respiratory failure with hypoxia EXAM: PORTABLE CHEST 1 VIEW COMPARISON:  04/25/22 FINDINGS: Again seen are the sternotomy wires and left subclavian pacemaker leads, stable. Cardiomediastinal silhouette is mildly prominent, stable. There is dense consolidation seen in the right upper lobe and to a lesser extent at the left upper lobe region and are new. Small patchy opacities at the bibasilar regions. No pleural effusion. Bony thorax is unremarkable. IMPRESSION: There is dense consolidation in the right upper lobe and to a lesser  extent at the left upper lobe region of multifocal pneumonia and are new. Bibasilar atelectasis greater on the left. Electronically Signed   By: Marjo Bicker M.D.   On: 03/28/2022 08:09      LOS: 2 days    Joycelyn Das, MD [facility] Available via Epic secure chat 7am-7pm After these hours, please refer to coverage provider listed on [email or web address] 03/29/2022, 10:34 AM

## 2022-03-29 NOTE — Progress Notes (Signed)
RN increased O2 from 15L [place] to NRBM to maintain O2 Sats. Pt found restless, temp 100 with increased WOB and RR greater than 40. Attempted ABG x2 w/assistance. VBG instead obtained. MD notified of PT status and requested to bedside.

## 2022-03-29 NOTE — Progress Notes (Signed)
   03/29/22 1200  Assess: MEWS Score  BP 128/76  MAP (mmHg) 93  ECG Heart Rate (!) 107  Resp (!) 48  SpO2 (!) 84 %  O2 Device Non-rebreather Mask  Assess: MEWS Score  MEWS Temp 0  MEWS Systolic 0  MEWS Pulse 1  MEWS RR 3  MEWS LOC 0  MEWS Score 4  MEWS Score Color Red  Treat  MEWS Interventions Escalated (See documentation below);Administered scheduled meds/treatments  Take Vital Signs  Increase Vital Sign Frequency  Red: Q 1hr X 4 then Q 4hr X 4, if remains red, continue Q 4hrs  Escalate  MEWS: Escalate Red: discuss with charge nurse/RN and provider, consider discussing with RRT  Notify: Charge Nurse/RN  Name of Charge Nurse/RN Notified Arlys John RN  Date Charge Nurse/RN Notified 03/29/22  Time Charge Nurse/RN Notified 1200  Notify: Provider  Provider Name/Title Dr Tyson Babinski  Date Provider Notified 03/29/22  Time Provider Notified 1200  Method of Notification Page  Notification Reason Other (Comment) (continued work of breathing)  Provider response See new orders  Date of Provider Response 03/29/22  Notify: Rapid Response  Name of Rapid Response RN Notified Mitzi Davenport RN  Date Rapid Response Notified 03/29/22  Time Rapid Response Notified 1200  Document  Progress note created (see row info) Yes  Assess: SIRS CRITERIA  SIRS Temperature  0  SIRS Pulse 1  SIRS Respirations  1  SIRS WBC 1  SIRS Score Sum  3   Patient continues with agitation and increased work of breathing.  Escalated to Rapid Response team.  Rapid response reached out to MD.

## 2022-03-30 ENCOUNTER — Other Ambulatory Visit (HOSPITAL_COMMUNITY): Payer: Self-pay

## 2022-03-30 ENCOUNTER — Inpatient Hospital Stay (HOSPITAL_COMMUNITY): Payer: Medicare HMO

## 2022-03-30 ENCOUNTER — Ambulatory Visit: Payer: Medicare HMO

## 2022-03-30 DIAGNOSIS — J690 Pneumonitis due to inhalation of food and vomit: Secondary | ICD-10-CM | POA: Diagnosis not present

## 2022-03-30 DIAGNOSIS — I214 Non-ST elevation (NSTEMI) myocardial infarction: Secondary | ICD-10-CM | POA: Diagnosis not present

## 2022-03-30 DIAGNOSIS — F05 Delirium due to known physiological condition: Secondary | ICD-10-CM | POA: Diagnosis not present

## 2022-03-30 DIAGNOSIS — F03918 Unspecified dementia, unspecified severity, with other behavioral disturbance: Secondary | ICD-10-CM | POA: Diagnosis not present

## 2022-03-30 DIAGNOSIS — R69 Illness, unspecified: Secondary | ICD-10-CM | POA: Diagnosis not present

## 2022-03-30 DIAGNOSIS — J9690 Respiratory failure, unspecified, unspecified whether with hypoxia or hypercapnia: Secondary | ICD-10-CM | POA: Diagnosis not present

## 2022-03-30 MED ORDER — GLYCOPYRROLATE 0.2 MG/ML IJ SOLN: 0.2000 mg | INTRAMUSCULAR | Status: DC | PRN

## 2022-03-30 MED ORDER — HALOPERIDOL LACTATE 5 MG/ML IJ SOLN: 0.5000 mg | INTRAMUSCULAR | Status: DC | PRN

## 2022-03-30 MED ORDER — BISACODYL 10 MG RE SUPP: 10.0000 mg | Freq: Every day | RECTAL | Status: DC | PRN

## 2022-03-30 MED ORDER — GLYCOPYRROLATE 1 MG PO TABS: 1.0000 mg | ORAL_TABLET | ORAL | Status: DC | PRN

## 2022-03-30 MED ORDER — HALOPERIDOL LACTATE 2 MG/ML PO CONC: 0.5000 mg | ORAL | Status: DC | PRN

## 2022-03-30 MED ORDER — DIPHENHYDRAMINE HCL 50 MG/ML IJ SOLN: 12.5000 mg | INTRAMUSCULAR | Status: DC | PRN

## 2022-03-30 MED ORDER — HALOPERIDOL 0.5 MG PO TABS: 0.5000 mg | ORAL_TABLET | ORAL | Status: DC | PRN

## 2022-03-30 MED ORDER — ONDANSETRON 4 MG PO TBDP: 4.0000 mg | ORAL_TABLET | Freq: Four times a day (QID) | ORAL | Status: DC | PRN

## 2022-03-30 MED ORDER — OCTREOTIDE ACETATE 100 MCG/ML IJ SOLN: 100.0000 ug | Freq: Three times a day (TID) | INTRAMUSCULAR | Status: DC | PRN

## 2022-03-30 MED ORDER — MORPHINE SULFATE (PF) 2 MG/ML IV SOLN
2.0000 mg | INTRAVENOUS | Status: DC | PRN
  Administered 2022-03-30: 2 mg via INTRAVENOUS
  Filled 2022-03-30: qty 1

## 2022-03-30 MED ORDER — ONDANSETRON HCL 4 MG/2ML IJ SOLN: 4.0000 mg | Freq: Four times a day (QID) | INTRAMUSCULAR | Status: DC | PRN

## 2022-03-30 MED ORDER — POLYVINYL ALCOHOL 1.4 % OP SOLN: 1.0000 [drp] | Freq: Four times a day (QID) | OPHTHALMIC | Status: DC | PRN

## 2022-04-04 NOTE — Death Summary Note (Signed)
DEATH SUMMARY   Patient Details  Name: Andre Malone MRN: [id number] DOB: 25-May-1951 [id number], [name], FNP Admission/Discharge Information   Admit Date:  03/12/2022  Date of Death:  03-Apr-2022   Time of Death:  05:08  Length of Stay: 3   Principle Cause of death: Pneumonia   Hospital Diagnoses: Principal Problem:   NSTEMI (non-ST elevated myocardial infarction) ([facility]) Active Problems:   Hypothyroidism   Type 2 diabetes mellitus with diabetic neuropathy, with long-term current use of insulin (HCC)   CAD, NATIVE VESSEL   Chronic kidney disease, stage 3b (HCC)   Paroxysmal atrial fibrillation (HCC)   S/P AVR (aortic valve replacement)   Essential hypertension   RLS (restless legs syndrome)   Senile dementia, delirium, with behavioral disturbance [facility])   Hospital Course: 71 year old male with multiple comorbidities including Alzheimer's dementia, CAD s/p PCI, PAF, aortic stenosis s/p AVR, complete heart block s/p PPM, s/p recent generator exchange [id number], chronic diastolic heart failure, hypertension, diabetes mellitus type 2, CKD stage IIIb, restless leg syndrome who presented to the ED with chest pain, was diagnosed with NSTEMI, and admitted by cardiology.    He became agitated in the ED, psychiatry was consulted, deemed patient has no capacity and he was IVC'd.  Wellbutrin was discontinued which patient was taking at home.  Patient has been started on Depakote. He has been very somnolent since starting Depakote with episodes of agitation requiring restraints. Patient became hypoxemic so PCCM was consulted early am of 7/25. TRH was consulted for worsening delirium  He was started on Unasyn for aspiration pneumonia per PCCM recommendation. Oxygen requirements continued to increase despite treatment and he continued to have confusion and agitation. Troponin trend was flat and cardiology questioned whether the NSTEMI was type II in setting of pneumonia and agitation.    Patient's family stated that the patient had filled out paperwork indicating his wishes for DNR/DNI prior to his onset of confusion and they wanted to respect that. With worsening respiratory status and agitation, the patient was treated with morphine and Ativan and the idea of transitioning to comfort care was introduced to the patient's family on 03/29/22 . The night of 03/29/22, work of breathing increased significantly and patient appeared to be in acute distress. CXR and exam did not reveal any readily reversible cause for the patient's worsening which is believed to be from pneumonia. The patient's son was updated and came to the bedside early am of 04/03/22 where he asked that we transition to focusing solely on keeping the patient comfortable. He was treated with morphine injection as needed and expired at 05:08 am on April 03, 2022.    Procedures: none   Consultations: Critical Care, Cardiology, Psychiatry   The results of significant diagnostics from this hospitalization (including imaging, microbiology, ancillary and laboratory) are listed below for reference.   Significant Diagnostic Studies: DG CHEST PORT 1 VIEW  Result Date: 04/03/2022 CLINICAL DATA:  Respiratory failure with hypoxia. EXAM: PORTABLE CHEST 1 VIEW COMPARISON:  March 28, 2022 FINDINGS: There is a multi lead AICD. Multiple sternal wires are present. The cardiac silhouette is mildly enlarged and unchanged in size. An artificial cardiac valve is noted. Persistent, predominantly stable infiltrates are seen involving the right upper lobe, mid left lung and bilateral lung bases. There is no evidence of a pleural effusion or pneumothorax. The visualized skeletal structures are unremarkable. IMPRESSION: Persistent bilateral multifocal infiltrates, without significant interval change when compared to the prior study. Electronically Signed   By: Andre Malone.D.  On: 2022-04-06 00:55   ECHOCARDIOGRAM COMPLETE  Result Date:  03/28/2022    ECHOCARDIOGRAM REPORT   Patient Name:   Andre Malone Date of Exam: 03/28/2022 Medical Rec #:  [id number]        Height:       66.0 in Accession #:    [id number]       Weight:       196.4 lb Date of Birth:  Feb 14, 1951       BSA:          1.985 m Patient Age:    70 years         BP:           103/54 mmHg Patient Gender: M                HR:           62 bpm. Exam Location:  Inpatient Procedure: 2D Echo, Color Doppler, Cardiac Doppler and Intracardiac            Opacification Agent Indications:    Acute Ischemic Heart Disease i24.9  History:        Patient has prior history of Echocardiogram examinations, most                 recent 01/05/2021. CAD, Pacemaker, Arrythmias:Atrial Fibrillation;                 Risk Factors:Hypertension, Diabetes and Dyslipidemia. Mechanical                 AVR with Bentall.  Sonographer:    Irving Burton Senior RDCS Referring Phys: Mikki Santee, S  Sonographer Comments: Technically difficult study, performed as efficiently as possible. Patient has been very combative this admission. IMPRESSIONS  1. Cannot fully assess LV wall motion. The apex appears akinetic. Left ventricular ejection fraction, by estimation, is 50 to 55%. The left ventricle has low normal function. Left ventricular diastolic parameters are indeterminate.  2. Right ventricular systolic function was not well visualized. The right ventricular size is not well visualized. There is normal pulmonary artery systolic pressure. The estimated right ventricular systolic pressure is 30.9 mmHg.  3. No evidence of mitral valve regurgitation.  4. S/p mechanical AVR wihth Bentall 2010. No gradient assessment.  5. The inferior vena cava is normal in size with greater than 50% respiratory variability, suggesting right atrial pressure of 3 mmHg. Conclusion(s)/Recommendation(s): Limited study. FINDINGS  Left Ventricle: Cannot fully assess LV wall motion. The apex appears akinetic. Left ventricular ejection fraction, by estimation,  is 50 to 55%. The left ventricle has low normal function. Definity contrast agent was given IV to delineate the left ventricular endocardial borders. The left ventricular internal cavity size was normal in size. Abnormal (paradoxical) septal motion, consistent with RV pacemaker. Left ventricular diastolic parameters are indeterminate. Right Ventricle: The right ventricular size is not well visualized. Right ventricular systolic function was not well visualized. There is normal pulmonary artery systolic pressure. The tricuspid regurgitant velocity is 2.64 m/s, and with an assumed right  atrial pressure of 3 mmHg, the estimated right ventricular systolic pressure is 30.9 mmHg. Left Atrium: Left atrial size was normal in size. Right Atrium: Right atrial size was normal in size. Mitral Valve: No evidence of mitral valve regurgitation. Tricuspid Valve: Tricuspid valve regurgitation is mild. Aortic Valve: S/p mechanical AVR wihth Bentall 2010. No gradient assessment. Aortic valve mean gradient measures 14.0 mmHg. Aortic valve peak gradient measures 21.5 mmHg. Aortic valve area, by VTI measures  1.42 cm. Pulmonic Valve: Pulmonic valve regurgitation is not visualized. Venous: The inferior vena cava is normal in size with greater than 50% respiratory variability, suggesting right atrial pressure of 3 mmHg.  LEFT VENTRICLE PLAX 2D LVIDd:         4.90 cm   Diastology LVIDs:         3.50 cm   LV e' medial:    4.13 cm/s LV PW:         1.40 cm   LV E/e' medial:  21.0 LV IVS:        1.20 cm   LV e' lateral:   5.55 cm/s LVOT diam:     2.10 cm   LV E/e' lateral: 15.6 LV SV:         67 LV SV Index:   34 LVOT Area:     3.46 cm  RIGHT VENTRICLE RV S prime:     6.96 cm/s TAPSE (M-mode): 1.8 cm LEFT ATRIUM             Index LA diam:        4.90 cm 2.47 cm/m LA Vol (A2C):   50.1 ml 25.24 ml/m LA Vol (A4C):   60.4 ml 30.44 ml/m LA Biplane Vol: 59.7 ml 30.08 ml/m  AORTIC VALVE AV Area (Vmax):    1.40 cm AV Area (Vmean):   1.23 cm AV  Area (VTI):     1.42 cm AV Vmax:           232.00 cm/s AV Vmean:          176.000 cm/s AV VTI:            0.473 m AV Peak Grad:      21.5 mmHg AV Mean Grad:      14.0 mmHg LVOT Vmax:         93.90 cm/s LVOT Vmean:        62.400 cm/s LVOT VTI:          0.194 m LVOT/AV VTI ratio: 0.41 MITRAL VALVE               TRICUSPID VALVE MV Area (PHT): 3.06 cm    TR Peak grad:   27.9 mmHg MV Decel Time: 248 msec    TR Vmax:        264.00 cm/s MV E velocity: 86.80 cm/s MV A velocity: 90.50 cm/s  SHUNTS MV E/A ratio:  0.96        Systemic VTI:  0.19 m                            Systemic Diam: 2.10 cm Carolan Clines Electronically signed by Carolan Clines Signature Date/Time: 03/28/2022/9:11:52 AM    Final    DG Chest Port 1 View  Result Date: 03/28/2022 CLINICAL DATA:  [id number] respiratory failure with hypoxia EXAM: PORTABLE CHEST 1 VIEW COMPARISON:  March 26, 2022 FINDINGS: Again seen are the sternotomy wires and left subclavian pacemaker leads, stable. Cardiomediastinal silhouette is mildly prominent, stable. There is dense consolidation seen in the right upper lobe and to a lesser extent at the left upper lobe region and are new. Small patchy opacities at the bibasilar regions. No pleural effusion. Bony thorax is unremarkable. IMPRESSION: There is dense consolidation in the right upper lobe and to a lesser extent at the left upper lobe region of multifocal pneumonia and are new. Bibasilar atelectasis greater on the left. Electronically Signed  By: Frazier Richards M.D.   On: 03/28/2022 08:09   DG Chest Port 1 View  Result Date: 03/20/2022 CLINICAL DATA:  Chest pain. EXAM: PORTABLE CHEST 1 VIEW COMPARISON:  March 17, 2022 FINDINGS: There is a multi lead AICD. Multiple sternal wires are noted. The heart size and mediastinal contours are within normal limits. There is moderate severity calcification of the aortic arch. An artificial aortic valve is noted. Both lungs are clear. Multilevel degenerative changes are seen throughout the  thoracic spine. IMPRESSION: 1. Prior median sternotomy/CABG. 2. No acute or active cardiopulmonary disease. Electronically Signed   By: Virgina Norfolk M.D.   On: 03/25/2022 23:19   EEG adult  Result Date: 03/21/2022 Lora Havens, MD     03/21/2022  4:41 PM Patient Name: Andre Malone MRN: [id number] Epilepsy Attending: Lora Havens Referring Physician/Provider: Lorenza Chick, MD Date: 03/21/2022 Duration: 21.42 mins Patient history: 71yo M with ams. EEG to evaluate for seizure. Level of alertness: Awake AEDs during EEG study: None Technical aspects: This EEG study was done with scalp electrodes positioned according to the 10-20 International system of electrode placement. Electrical activity was acquired at a sampling rate of 500Hz  and reviewed with a high frequency filter of 70Hz  and a low frequency filter of 1Hz . EEG data were recorded continuously and digitally stored. Description: The posterior dominant rhythm consists of 7 Hz activity of moderate voltage (25-35 uV) seen predominantly in posterior head regions, symmetric and reactive to eye opening and eye closing. EEG showed continuous generalized 3 to 6 Hz theta-delta slowing. Hyperventilation and photic stimulation were not performed.   ABNORMALITY - Continuous slow, generalized - Background slow IMPRESSION: This study is suggestive of moderate diffuse encephalopathy, nonspecific etiology. No seizures or epileptiform discharges were seen throughout the recording. Lora Havens   EP PPM/ICD IMPLANT  Result Date: 03/20/2022 Conclusion: Successful removal of a previously implanted dual-chamber pacemaker which had reached elective replacement, and insertion of a new dual-chamber and a patient with complete heart block and no ventricular escape. Cristopher Peru, MD   DG Chest Port 1 View  Result Date: 03/17/2022 CLINICAL DATA:  Altered mental status, status post Bentall procedure EXAM: PORTABLE CHEST 1 VIEW COMPARISON:  None Available.  FINDINGS: Lung volumes are small, but are symmetric and are clear. No pneumothorax or pleural effusion. Aortic valve replacement has been performed. Cardiac size is within normal limits when accounting for poor pulmonary insufflation. Right subclavian pacemaker is in place with single lead overlying the right atrium and 2 leads overlying the right ventricle, unchanged. Pulmonary vascularity is normal. Healed left rib fractures noted. No acute bone abnormality. IMPRESSION: 1. Pulmonary hypoinflation. 2. Status post aortic valve replacement. Electronically Signed   By: Fidela Salisbury M.D.   On: 03/17/2022 21:50   CT Head Wo Contrast  Result Date: 03/17/2022 CLINICAL DATA:  Altered mental status. EXAM: CT HEAD WITHOUT CONTRAST TECHNIQUE: Contiguous axial images were obtained from the base of the skull through the vertex without intravenous contrast. RADIATION DOSE REDUCTION: This exam was performed according to the departmental dose-optimization program which includes automated exposure control, adjustment of the mA and/or kV according to patient size and/or use of iterative reconstruction technique. COMPARISON:  Head CT dated 01/14/2021. FINDINGS: Brain: Mild age-related atrophy and chronic microvascular ischemic changes. There is no acute intracranial hemorrhage. No mass effect or midline shift. No extra-axial fluid collection. Vascular: No hyperdense vessel or unexpected calcification. Skull: Normal. Negative for fracture or focal lesion. Sinuses/Orbits: Multifocal periosteal  thickening of paranasal sinuses. No air-fluid. The mastoid air cells are clear. Other: None IMPRESSION: 1. No acute intracranial pathology. 2. Mild age-related atrophy and chronic microvascular ischemic changes. Electronically Signed   By: Anner Crete M.D.   On: 03/17/2022 21:44    Microbiology: Recent Results (from the past 240 hour(s))  Surgical pcr screen     Status: Abnormal   Collection Time: 03/20/22  1:25 PM   Specimen:  Nasal Mucosa; Nasal Swab  Result Value Ref Range Status   MRSA, PCR NEGATIVE NEGATIVE Final   Staphylococcus aureus POSITIVE (A) NEGATIVE Final    Comment: (NOTE) The Xpert SA Assay (FDA approved for NASAL specimens in patients 42 years of age and older), is one component of a comprehensive surveillance program. It is not intended to diagnose infection nor to guide or monitor treatment. Performed at [facility], [place] [address], [place], [place] [address]     Time spent: 42 minutes  Signed: Vianne Bulls, MD 04-11-22

## 2022-04-04 NOTE — Progress Notes (Addendum)
2147 Pt struggling to meet oxygen demands. Pt O2 sat higher 80's and Non-rebreather 15L. Pt is restless observed to be in distress.T. Opyd, MD notified. Myriam Jacobson, Respiratory therapist called. RN will continue to monitor pt. Awaiting response.  2200 Orders received. Pt placed on HHFNC. RN will continue to monitor pt.  2347 Pt O2 sat in the lower 80s and RR in the 70's.T. Opyd notified. Myriam Jacobson, Respiratory therapist called. Rapid response called. RN will continue to monitor pt. RN will continue to monitor pt. Awaiting response  2354 T. Opyd,MD at bedside to see pt. MD notified pt's son. RN will continue to monitor pt.  0218 Pt O2 sat in higher 60's. Respiratory therapist called. T. Opyd, MD notified. PRN medication administered. RN will continue to monitor pt.   0300 Pt's son at bedside.   0430 DNR armband on pt.  0530 Pt was pronounced dead at 0508. Timothy Opyd,MD notified. Pt's son notified. [facility] bridge called. Post-mortem documentation completed. Bed placement notified. Pt given a bath. Pt transported to morgue.

## 2022-04-04 NOTE — Plan of Care (Signed)
  Problem: Cardiac: Goal: Ability to achieve and maintain adequate cardiovascular perfusion will improve Outcome: Not Progressing   Problem: Health Behavior/Discharge Planning: Goal: Ability to safely manage health-related needs after discharge will improve Outcome: Not Progressing   Problem: Education: Goal: Knowledge of General Education information will improve Description: Including pain rating scale, medication(s)/side effects and non-pharmacologic comfort measures Outcome: Not Progressing   Problem: Health Behavior/Discharge Planning: Goal: Ability to manage health-related needs will improve Outcome: Not Progressing

## 2022-04-04 NOTE — Progress Notes (Addendum)
Patient seen for respiratory distress. He has aspiration pneumonia with acute hypoxic respiratory failure. He has had increasing O2 requirement and increasing WOB, appears to be in acute distress.   Spoke with his son who was already aware that his father is critically ill and may not survive this illness, and was planning to convene with family later this am to decide on whether to transition to comfort care only. He agrees with using morphine now as his father appears to be suffering.    ADDENDUM (03:55): Patient's respiratory status continues to worsen, no acute or readily reversible findings on CXR, discussed again with patient's son that he may not make it through the night despite our best efforts. Patient's son came to the bedside and asked that we transition to comfort care only.

## 2022-04-04 DEATH — deceased

## 2022-05-22 ENCOUNTER — Other Ambulatory Visit: Payer: Self-pay | Admitting: Nurse Practitioner

## 2022-05-22 DIAGNOSIS — E039 Hypothyroidism, unspecified: Secondary | ICD-10-CM

## 2022-06-22 ENCOUNTER — Encounter: Payer: Medicare HMO | Admitting: Internal Medicine

## 2022-07-24 ENCOUNTER — Ambulatory Visit: Payer: Medicare HMO | Admitting: Nurse Practitioner
